# Patient Record
Sex: Female | Born: 1945 | Race: Black or African American | Hispanic: No | Marital: Single | Smoking: Never smoker
Health system: Southern US, Community
[De-identification: ages and names within clinical notes are randomized; demographics above are authoritative.]

## PROBLEM LIST (undated history)

## (undated) DIAGNOSIS — I10 Essential (primary) hypertension: Principal | ICD-10-CM

## (undated) DIAGNOSIS — E119 Type 2 diabetes mellitus without complications: Secondary | ICD-10-CM

## (undated) DIAGNOSIS — I639 Cerebral infarction, unspecified: Secondary | ICD-10-CM

## (undated) DIAGNOSIS — E669 Obesity, unspecified: Secondary | ICD-10-CM

## (undated) HISTORY — DX: Type 2 diabetes mellitus without complications: E11.9

## (undated) HISTORY — DX: Obesity, unspecified: E66.9

## (undated) HISTORY — DX: Essential (primary) hypertension: I10

## (undated) HISTORY — DX: Cerebral infarction, unspecified: I63.9

---

## 2009-02-23 ENCOUNTER — Emergency Department (HOSPITAL_COMMUNITY): Admission: EM | Admit: 2009-02-23 | Discharge: 2009-02-23 | Payer: Self-pay | Admitting: Family Medicine

## 2009-06-10 ENCOUNTER — Encounter: Admission: RE | Admit: 2009-06-10 | Discharge: 2009-06-10 | Payer: Self-pay | Admitting: Specialist

## 2009-08-28 ENCOUNTER — Ambulatory Visit (HOSPITAL_COMMUNITY): Admission: RE | Admit: 2009-08-28 | Discharge: 2009-08-28 | Payer: Self-pay | Admitting: Cardiology

## 2009-10-13 ENCOUNTER — Encounter: Admission: RE | Admit: 2009-10-13 | Discharge: 2009-10-13 | Payer: Self-pay | Admitting: Family Medicine

## 2010-10-10 ENCOUNTER — Encounter: Payer: Self-pay | Admitting: Family Medicine

## 2014-09-15 ENCOUNTER — Encounter (HOSPITAL_COMMUNITY): Payer: Self-pay | Admitting: Emergency Medicine

## 2014-09-15 ENCOUNTER — Emergency Department (HOSPITAL_COMMUNITY): Payer: Medicare Other

## 2014-09-15 ENCOUNTER — Emergency Department (HOSPITAL_COMMUNITY)
Admission: EM | Admit: 2014-09-15 | Discharge: 2014-09-15 | Disposition: A | Discharge status: Home / Self Care | Payer: Medicare Other | Attending: Emergency Medicine | Admitting: Emergency Medicine

## 2014-09-15 DIAGNOSIS — I10 Essential (primary) hypertension: Secondary | ICD-10-CM | POA: Diagnosis not present

## 2014-09-15 DIAGNOSIS — E119 Type 2 diabetes mellitus without complications: Secondary | ICD-10-CM | POA: Diagnosis not present

## 2014-09-15 DIAGNOSIS — R5383 Other fatigue: Secondary | ICD-10-CM | POA: Diagnosis not present

## 2014-09-15 DIAGNOSIS — R9431 Abnormal electrocardiogram [ECG] [EKG]: Secondary | ICD-10-CM | POA: Diagnosis present

## 2014-09-15 HISTORY — DX: Essential (primary) hypertension: I10

## 2014-09-15 HISTORY — DX: Type 2 diabetes mellitus without complications: E11.9

## 2014-09-15 LAB — BASIC METABOLIC PANEL
Anion gap: 12 (ref 5–15)
BUN: 7 mg/dL (ref 6–23)
CO2: 26 mmol/L (ref 19–32)
Calcium: 9.6 mg/dL (ref 8.4–10.5)
Chloride: 104 mEq/L (ref 96–112)
Creatinine, Ser: 0.83 mg/dL (ref 0.50–1.10)
GFR calc Af Amer: 82 mL/min — ABNORMAL LOW (ref >90)
GFR calc non Af Amer: 71 mL/min — ABNORMAL LOW (ref >90)
Glucose, Bld: 142 mg/dL — ABNORMAL HIGH (ref 70–99)
Potassium: 3.5 mmol/L (ref 3.5–5.1)
Sodium: 142 mmol/L (ref 135–145)

## 2014-09-15 LAB — BRAIN NATRIURETIC PEPTIDE: B Natriuretic Peptide: 218.4 pg/mL — ABNORMAL HIGH (ref 0.0–100.0)

## 2014-09-15 LAB — CBC WITH DIFFERENTIAL/PLATELET
Basophils Absolute: 0 10*3/uL (ref 0.0–0.1)
Basophils Relative: 0 % (ref 0–1)
Eosinophils Absolute: 0 10*3/uL (ref 0.0–0.7)
Eosinophils Relative: 0 % (ref 0–5)
HCT: 41 % (ref 36.0–46.0)
Hemoglobin: 13.5 g/dL (ref 12.0–15.0)
Lymphocytes Relative: 36 % (ref 12–46)
Lymphs Abs: 2.6 10*3/uL (ref 0.7–4.0)
MCH: 23.4 pg — ABNORMAL LOW (ref 26.0–34.0)
MCHC: 32.9 g/dL (ref 30.0–36.0)
MCV: 70.9 fL — ABNORMAL LOW (ref 78.0–100.0)
Monocytes Absolute: 0.3 10*3/uL (ref 0.1–1.0)
Monocytes Relative: 5 % (ref 3–12)
Neutro Abs: 4.3 10*3/uL (ref 1.7–7.7)
Neutrophils Relative %: 59 % (ref 43–77)
Platelets: 194 10*3/uL (ref 150–400)
RBC: 5.78 MIL/uL — ABNORMAL HIGH (ref 3.87–5.11)
RDW: 15.4 % (ref 11.5–15.5)
WBC: 7.3 10*3/uL (ref 4.0–10.5)

## 2014-09-15 LAB — I-STAT TROPONIN, ED
Troponin i, poc: 0.05 ng/mL (ref 0.00–0.08)
Troponin i, poc: 0.07 ng/mL (ref 0.00–0.08)

## 2014-09-15 LAB — CBG MONITORING, ED: Glucose-Capillary: 122 mg/dL — ABNORMAL HIGH (ref 70–99)

## 2014-09-15 MED ORDER — HYDRALAZINE HCL 20 MG/ML IJ SOLN
10.0000 mg | Freq: Once | INTRAMUSCULAR | Status: AC
  Administered 2014-09-15: 10 mg via INTRAVENOUS
  Filled 2014-09-15: qty 1

## 2014-09-15 NOTE — ED Notes (Signed)
MD at bedside. 

## 2014-09-15 NOTE — ED Notes (Signed)
X-ray at bedside

## 2014-09-15 NOTE — ED Provider Notes (Signed)
Patient presented to the ER with abnormal EKG. Patient reports that she has been feeling sick, mild weakness and dizziness recently and went to urgent care today because her doctor could not see her. She had an EKG performed and it was abnormal, was sent to the ER for further evaluation. Patient is not expressing any chest pain.  Face to face Exam: HEENT - PERRLA Lungs - CTAB Heart - RRR, no M/R/G Abd - S/NT/ND Neuro - alert, oriented x3  Plan: EKG consistent with LVH with repolarization abnormality. Remainder of workup was unremarkable. Troponin negative 2. Patient is very hypertensive here in the ER, this was treated here in the ER and will need to be addressed by her primary doctor. She does have a follow-up appointment this week.   Gilda Creasehristopher J. Adien Kimmel, MD 09/15/14 2227

## 2014-09-15 NOTE — ED Provider Notes (Signed)
CSN: [id number]     Arrival date & time 09/15/14  1831 History   [facility] Initiated Contact with Patient 09/15/14 1902     Chief Complaint  Patient presents with  . Abnormal ECG    The [facility] and she was told her ECG was abnormal.  The reason she went to [facility] is she was not feeling well this morning.     (Consider location/radiation/quality/duration/timing/severity/associated sxs/prior Treatment) The history is provided by the patient and medical records.    This is a 68 year old female with past medical history significant for hypertension, diabetes, presenting to the ED from urgent care for abnormal EKG.  Patient states this morning upon waking she just feel fatigued and unwell.  She was recently treated for a sinus infection and still felt as though she had some nasal congestion.  States she had breakfast and went to get up to use the bathroom and had a split second of dizziness after standing.  She states she has had issues with her blood sugar in the past with similar symptoms so was concerned for that.  States she called her PCP, Dr. Ricki MillerPang, but they could not see her today so she went to urgent care.  States upon arrival there was told her BP was elevated, EKG was performed and sent here for further evaluation.  Patient denies any chest pain, shortness of breath, palpitations, dizziness, lightheadedness, cough, fever, or chills.  Patient has no known cardiac hx.  She reports years ago she was evaluated by Dr. Nadara EatonGangi but was released with normal work-up.  Sister had MI, no other family cardiac history.  Patient has never been a smoker, no EtOH use.  Past Medical History  Diagnosis Date  . Hypertension   . Diabetes mellitus without complication    History reviewed. No pertinent past surgical history. History reviewed. No pertinent family history. History  Substance Use Topics  . Smoking status: Never Smoker   . Smokeless tobacco: Never Used  . Alcohol Use: No    OB History    No data available     Review of Systems  Cardiovascular:       Abnormal EKG  All other systems reviewed and are negative.     Allergies  Review of patient's allergies indicates not on file.  Home Medications   Prior to Admission medications   Not on File   BP 209/80 mmHg  Pulse 66  Temp(Src) 98.6 F (37 C) (Oral)  Resp 22  SpO2 97%   Physical Exam  Constitutional: She is oriented to person, place, and time. She appears well-developed and well-nourished. No distress.  HENT:  Head: Normocephalic and atraumatic.  Mouth/Throat: Oropharynx is clear and moist.  Eyes: Conjunctivae and EOM are normal. Pupils are equal, round, and reactive to light.  Neck: Normal range of motion. Neck supple.  Cardiovascular: Normal rate, regular rhythm and normal heart sounds.   Pulmonary/Chest: Effort normal and breath sounds normal. No respiratory distress. She has no wheezes.  Abdominal: Soft. Bowel sounds are normal. There is no tenderness. There is no guarding.  Musculoskeletal: Normal range of motion. She exhibits edema.  Trace pitting edema BLE No calf asymmetry, tenderness, or palpable cords; no overlying erythema or warmth to touch; DP pulses intact BLE  Neurological: She is alert and oriented to person, place, and time.  AAOx3, answering questions appropriately; equal strength UE and LE bilaterally; CN grossly intact; moves all extremities appropriately without ataxia; no focal neuro deficits  or facial asymmetry appreciated  Skin: Skin is warm and dry. She is not diaphoretic.  Psychiatric: She has a normal mood and affect.  Nursing note and vitals reviewed.   ED Course  Procedures (including critical care time) Labs Review Labs Reviewed  CBC WITH DIFFERENTIAL - Abnormal; Notable for the following:    RBC 5.78 (*)    MCV 70.9 (*)    MCH 23.4 (*)    All other components within normal limits  BASIC METABOLIC PANEL - Abnormal; Notable for the following:     Glucose, Bld 142 (*)    GFR calc non Af Amer 71 (*)    GFR calc Af Amer 82 (*)    All other components within normal limits  BRAIN NATRIURETIC PEPTIDE - Abnormal; Notable for the following:    B Natriuretic Peptide 218.4 (*)    All other components within normal limits  CBG MONITORING, ED - Abnormal; Notable for the following:    Glucose-Capillary 122 (*)    All other components within normal limits  I-STAT TROPOININ, ED  Rosezena SensorI-STAT TROPOININ, ED    Imaging Review Dg Chest Port 1 View  09/15/2014   CLINICAL DATA:  Abnormal electrocardiogram; hypertension  EXAM: PORTABLE CHEST - 1 VIEW  COMPARISON:  Chest CT August 28, 2009  FINDINGS: Lungs are clear. Heart is enlarged with pulmonary vascularity within normal limits. No adenopathy. No bone lesions.  IMPRESSION: Cardiomegaly.  No edema or consolidation.   Electronically Signed   By: Bretta BangWilliam  Woodruff M.D.   On: 09/15/2014 19:41     EKG Interpretation   Date/Time:  Monday September 15 2014 18:41:54 EST Ventricular Rate:  74 PR Interval:  202 QRS Duration: 102 QT Interval:  448 QTC Calculation: 497 R Axis:   54 Text Interpretation:  Sinus rhythm with Premature supraventricular  complexes Septal infarct , age undetermined Marked ST abnormality,  possible inferior subendocardial injury Abnormal ECG No previous tracing  Confirmed by POLLINA  MD, CHRISTOPHER [phone]([id number]) on 09/15/2014 6:51:33 PM      MDM   Final diagnoses:  Essential hypertension   68 y.o.F sent to ED from urgent care for abnormal EKG.  Patient does have noted ST abnormalities, no old EKG available for comparison.  She denies chest pain or SOB.  Lab work was obtained which is reassuring, troponin negative.  CXR clear.  Patient is hypertensive in the ED, states normal pressure for her is around 140's/80's.  Patient given dose of hydralazine.  Will obtain delta troponin and repeat EKG.  Delta troponin negative.  Repeat EKG more consistent with LVH pattern.  Patient remains  without any chest pain or SOB.  Her BP has improved, no signs of end organ damage today.  Patient will be discharged home. She has FU appt with her PCP tomorrow-- she was given copies of her labs and EKG for physician review.  I have also encouraged her to discuss her BP with her PCP.  Discussed plan with patient, he/she acknowledged understanding and agreed with plan of care.  Return precautions given for new or worsening symptoms.  Case discussed with attending physician, Dr. Blinda LeatherwoodPollina, who evaluated patient and agrees with assessment and plan of care.  Garlon HatchetLisa M Cynda Soule, PA-C 09/16/14 0004  Gilda Creasehristopher J. Pollina, MD 09/16/14 [phone]

## 2014-09-15 NOTE — ED Notes (Signed)
The patient went to [facility] and she was told her ECG was abnormal.  The reason she went to [facility] is she was not feeling well this morning.  She denies chest pain, sob or any other symptoms.  She did say she has been dizzy and lightheaded for a while and thought maybe it was her "sugar" (she has been told she is pre-diabetic).  She was sent here from [facility] to be evaluated.

## 2014-09-15 NOTE — ED Notes (Signed)
Pt CBG 122. RN notified.

## 2014-09-15 NOTE — Discharge Instructions (Signed)
Follow-up with your primary care physician tomorrow as scheduled.  Copies of lab work and EKG attached for their review. Return to the ED for new or worsening symptoms.

## 2014-09-23 DIAGNOSIS — I10 Essential (primary) hypertension: Secondary | ICD-10-CM | POA: Diagnosis not present

## 2014-10-06 DIAGNOSIS — I10 Essential (primary) hypertension: Secondary | ICD-10-CM | POA: Diagnosis not present

## 2014-10-06 DIAGNOSIS — E559 Vitamin D deficiency, unspecified: Secondary | ICD-10-CM | POA: Diagnosis not present

## 2014-10-06 DIAGNOSIS — R7309 Other abnormal glucose: Secondary | ICD-10-CM | POA: Diagnosis not present

## 2014-10-12 ENCOUNTER — Emergency Department (HOSPITAL_COMMUNITY): Payer: Medicare Other

## 2014-10-12 ENCOUNTER — Encounter (HOSPITAL_COMMUNITY): Payer: Self-pay

## 2014-10-12 ENCOUNTER — Emergency Department (HOSPITAL_COMMUNITY)
Admission: EM | Admit: 2014-10-12 | Discharge: 2014-10-12 | Disposition: A | Discharge status: Home / Self Care | Payer: Medicare Other | Attending: Emergency Medicine | Admitting: Emergency Medicine

## 2014-10-12 DIAGNOSIS — R5381 Other malaise: Secondary | ICD-10-CM

## 2014-10-12 DIAGNOSIS — R0789 Other chest pain: Secondary | ICD-10-CM

## 2014-10-12 DIAGNOSIS — I10 Essential (primary) hypertension: Secondary | ICD-10-CM | POA: Insufficient documentation

## 2014-10-12 DIAGNOSIS — E119 Type 2 diabetes mellitus without complications: Secondary | ICD-10-CM | POA: Diagnosis not present

## 2014-10-12 DIAGNOSIS — Z7982 Long term (current) use of aspirin: Secondary | ICD-10-CM | POA: Diagnosis not present

## 2014-10-12 DIAGNOSIS — R079 Chest pain, unspecified: Secondary | ICD-10-CM | POA: Diagnosis present

## 2014-10-12 DIAGNOSIS — R5383 Other fatigue: Secondary | ICD-10-CM | POA: Diagnosis not present

## 2014-10-12 DIAGNOSIS — Z79899 Other long term (current) drug therapy: Secondary | ICD-10-CM | POA: Diagnosis not present

## 2014-10-12 DIAGNOSIS — R069 Unspecified abnormalities of breathing: Secondary | ICD-10-CM | POA: Diagnosis not present

## 2014-10-12 LAB — CBC
HCT: 40.2 % (ref 36.0–46.0)
Hemoglobin: 13.3 g/dL (ref 12.0–15.0)
MCH: 23.5 pg — ABNORMAL LOW (ref 26.0–34.0)
MCHC: 33.1 g/dL (ref 30.0–36.0)
MCV: 70.9 fL — ABNORMAL LOW (ref 78.0–100.0)
Platelets: 173 10*3/uL (ref 150–400)
RBC: 5.67 MIL/uL — ABNORMAL HIGH (ref 3.87–5.11)
RDW: 15.3 % (ref 11.5–15.5)
WBC: 7.4 10*3/uL (ref 4.0–10.5)

## 2014-10-12 LAB — COMPREHENSIVE METABOLIC PANEL
ALT: 23 U/L (ref 0–35)
AST: 25 U/L (ref 0–37)
Albumin: 3.8 g/dL (ref 3.5–5.2)
Alkaline Phosphatase: 72 U/L (ref 39–117)
Anion gap: 5 (ref 5–15)
BUN: 7 mg/dL (ref 6–23)
CO2: 29 mmol/L (ref 19–32)
Calcium: 9.5 mg/dL (ref 8.4–10.5)
Chloride: 105 mmol/L (ref 96–112)
Creatinine, Ser: 0.82 mg/dL (ref 0.50–1.10)
GFR calc Af Amer: 83 mL/min — ABNORMAL LOW (ref >90)
GFR calc non Af Amer: 72 mL/min — ABNORMAL LOW (ref >90)
Glucose, Bld: 135 mg/dL — ABNORMAL HIGH (ref 70–99)
Potassium: 3.7 mmol/L (ref 3.5–5.1)
Sodium: 139 mmol/L (ref 135–145)
Total Bilirubin: 0.5 mg/dL (ref 0.3–1.2)
Total Protein: 7.3 g/dL (ref 6.0–8.3)

## 2014-10-12 LAB — TSH: TSH: 0.784 u[IU]/mL (ref 0.350–4.500)

## 2014-10-12 LAB — I-STAT TROPONIN, ED: Troponin i, poc: 0.05 ng/mL (ref 0.00–0.08)

## 2014-10-12 LAB — T4, FREE: Free T4: 1.07 ng/dL (ref 0.80–1.80)

## 2014-10-12 NOTE — ED Provider Notes (Signed)
CSN: [id number]     Arrival date & time 10/12/14  1032 History   [facility] Initiated Contact with Patient 10/12/14 1038     Chief Complaint  Patient presents with  . Chest Pain     (Consider location/radiation/quality/duration/timing/severity/associated sxs/prior Treatment) HPI The patient reports that she had been feeling some general malaise type symptoms for the past 2 week. She thought this may be due to her blood pressure medications. She has also been noting cramping feelings in her hands and her legs. She's had to go through several different adjustments to her medications and thinks that her medications make her feel unwell. She was concerned and didn't feel comfortable stopping them because she was advised that she could not discontinue them suddenly. She had an appointment scheduled for her primary care doctor on Friday but due to weather conditions the appointment was canceled. She thinks her symptoms started since starting felodipine about 2 weeks ago. She reports she felt really concerned because she was hoping to get this resolved on Friday. Then going into the weekend she was worried about being confined to her home (due to winter weather conditions) with no one to assist her if she started feeling worse. She reports this morning she started to feel short of breath and had a burning sensation across her upper chest. The symptoms are not there anymore. Her symptoms have come and gone intermittently in the past. She denies specifically associated with any activity or position. She has not had fever chills or sputum production. Past Medical History  Diagnosis Date  . Hypertension   . Diabetes mellitus without complication    History reviewed. No pertinent past surgical history. No family history on file. History  Substance Use Topics  . Smoking status: Never Smoker   . Smokeless tobacco: Never Used  . Alcohol Use: No   OB History    No data available     Review of  Systems  10 Systems reviewed and are negative for acute change except as noted in the HPI.   Allergies  Review of patient's allergies indicates no known allergies.  Home Medications   Prior to Admission medications   Medication Sig Start Date End Date Taking? Authorizing Provider  aspirin 81 MG tablet Take 81 mg by mouth daily.   Yes Historical Provider, MD  ergocalciferol (VITAMIN D2) 50000 UNITS capsule Take 50,000 Units by mouth once a week. Take on Mondays   Yes Historical Provider, MD  LORazepam (ATIVAN) 0.5 MG tablet Take 0.5 mg by mouth 2 (two) times daily.   Yes Historical Provider, MD  metoprolol succinate (TOPROL-XL) 50 MG 24 hr tablet Take 50 mg by mouth daily. 09/26/14  Yes Historical Provider, MD  valsartan-hydrochlorothiazide (DIOVAN-HCT) 320-12.5 MG per tablet Take 1 tablet by mouth daily.   Yes Historical Provider, MD   BP 161/75 mmHg  Pulse 44  Resp 18  Ht 5\' 8"  (1.727 m)  Wt 277 lb (125.646 kg)  BMI 42.13 kg/m2  SpO2 96% Physical Exam  Constitutional: She is oriented to person, place, and time. She appears well-developed and well-nourished.  HENT:  Head: Normocephalic and atraumatic.  Eyes: EOM are normal. Pupils are equal, round, and reactive to light.  Neck: Neck supple.  Cardiovascular: Normal rate, regular rhythm and intact distal pulses.  Exam reveals no gallop and no friction rub.   Murmur (Patient has approximately 1 to 2/6 systolic ejection murmur.) heard. Rhythm is regular with occasional ectopic beats.  Pulmonary/Chest: Effort normal and breath  sounds normal.  Abdominal: Soft. Bowel sounds are normal. She exhibits no distension. There is no tenderness.  Musculoskeletal: Normal range of motion. She exhibits no edema.  Neurological: She is alert and oriented to person, place, and time. She has normal strength. Coordination normal. GCS eye subscore is 4. GCS verbal subscore is 5. GCS motor subscore is 6.  Skin: Skin is warm, dry and intact.  Psychiatric:  She has a normal mood and affect.    ED Course  Procedures (including critical care time) Labs Review Labs Reviewed  CBC - Abnormal; Notable for the following:    RBC 5.67 (*)    MCV 70.9 (*)    MCH 23.5 (*)    All other components within normal limits  COMPREHENSIVE METABOLIC PANEL - Abnormal; Notable for the following:    Glucose, Bld 135 (*)    GFR calc non Af Amer 72 (*)    GFR calc Af Amer 83 (*)    All other components within normal limits  TSH  T4, FREE  I-STAT TROPOININ, ED    Imaging Review Dg Chest 2 View  10/12/2014   CLINICAL DATA:  Left-sided chest pain, hypertension  EXAM: CHEST  2 VIEW  COMPARISON:  09/15/2014  FINDINGS: Cardiac shadow is enlarged. The lungs are clear bilaterally. No acute bony abnormality is noted.  IMPRESSION: No acute abnormality noted.   Electronically Signed   By: Alcide Clever M.D.   On: 10/12/2014 11:31     EKG Interpretation   Date/Time:  Sunday October 12 2014 10:44:48 EST Ventricular Rate:  50 PR Interval:  174 QRS Duration: 106 QT Interval:  550 QTC Calculation: 502 R Axis:   30 Text Interpretation:  Sinus rhythm Multiple premature complexes, vent  LVH  with secondary repolarization abnormality Prolonged QT interval abnormal.  no change. Confirmed by Donnald Garre, MD, Lebron Conners [phone]) on 10/12/2014 11:00:53  AM      MDM   Final diagnoses:  Malaise and fatigue  Other chest pain   The patient has not been symptomatic since coming to the emergency department. She has trace he symptoms back for about 2 weeks. At this point in time based on diagnostic evaluation history a did fill the patient was safe for continued outpatient management. She has not taken her medications today and at this point I felt that she could hold the felodipine today and take her other regular medications and contact her family physician tomorrow. I have also advised that the patient follow-up with cardiology. She had been seen several years ago and does not have  a history of cardiac ischemic disease. I did however feel the patient's age and risk factors she should have cardiology assessment as well.    Arby Barrette, MD 10/12/14 [phone]

## 2014-10-12 NOTE — ED Notes (Addendum)
Intermittent burning in her chest and abdomen this morning, Presently she denies any burning or chest pain.  Pt. Denies any n/v/d or sob.  Pt. Lives alone and was unable to go to her Doctors appointment on Friday for her BP.  She became very anxious and worried.  She believes this has added to her chest burning.  She has been on 3 medications for her BP and they seem not to be working and requiring continuous adjustments which also makes the pt. Feel anxious.  Skin is warm and dry and  Pt. Is alert and oriented X4.  Pt. Reports being very scared yesterday, due to not feeling well and the weather.  She stated, "I was very anxious and nervous."

## 2014-10-12 NOTE — ED Notes (Signed)
Pt verbalized understanding of discharge instructions and no further questions 

## 2014-10-12 NOTE — Discharge Instructions (Signed)
Chest Pain (Nonspecific)  Since you have been experiencing symptoms of fatigue, weakness and cramps since starting felodipine, do not take today's dose. Call your family doctor in the morning to reschedule your appointment and continue your blood pressure and symptoms monitoring off felodipine. Also call the cardiologist listed below to schedule an appointment for further evaluation as soon as possible.  It is often hard to give a specific diagnosis for the cause of chest pain. There is always a chance that your pain could be related to something serious, such as a heart attack or a blood clot in the lungs. You need to follow up with your health care provider for further evaluation. CAUSES   Heartburn.  Pneumonia or bronchitis.  Anxiety or stress.  Inflammation around your heart (pericarditis) or lung (pleuritis or pleurisy).  A blood clot in the lung.  A collapsed lung (pneumothorax). It can develop suddenly on its own (spontaneous pneumothorax) or from trauma to the chest.  Shingles infection (herpes zoster virus). The chest wall is composed of bones, muscles, and cartilage. Any of these can be the source of the pain.  The bones can be bruised by injury.  The muscles or cartilage can be strained by coughing or overwork.  The cartilage can be affected by inflammation and become sore (costochondritis). DIAGNOSIS  Lab tests or other studies may be needed to find the cause of your pain. Your health care provider may have you take a test called an ambulatory electrocardiogram (ECG). An ECG records your heartbeat patterns over a 24-hour period. You may also have other tests, such as:  Transthoracic echocardiogram (TTE). During echocardiography, sound waves are used to evaluate how blood flows through your heart.  Transesophageal echocardiogram (TEE).  Cardiac monitoring. This allows your health care provider to monitor your heart rate and rhythm in real time.  Holter monitor. This is  a portable device that records your heartbeat and can help diagnose heart arrhythmias. It allows your health care provider to track your heart activity for several days, if needed.  Stress tests by exercise or by giving medicine that makes the heart beat faster. TREATMENT   Treatment depends on what may be causing your chest pain. Treatment may include:  Acid blockers for heartburn.  Anti-inflammatory medicine.  Pain medicine for inflammatory conditions.  Antibiotics if an infection is present.  You may be advised to change lifestyle habits. This includes stopping smoking and avoiding alcohol, caffeine, and chocolate.  You may be advised to keep your head raised (elevated) when sleeping. This reduces the chance of acid going backward from your stomach into your esophagus. Most of the time, nonspecific chest pain will improve within 2-3 days with rest and mild pain medicine.  HOME CARE INSTRUCTIONS   If antibiotics were prescribed, take them as directed. Finish them even if you start to feel better.  For the next few days, avoid physical activities that bring on chest pain. Continue physical activities as directed.  Do not use any tobacco products, including cigarettes, chewing tobacco, or electronic cigarettes.  Avoid drinking alcohol.  Only take medicine as directed by your health care provider.  Follow your health care provider's suggestions for further testing if your chest pain does not go away.  Keep any follow-up appointments you made. If you do not go to an appointment, you could develop lasting (chronic) problems with pain. If there is any problem keeping an appointment, call to reschedule. SEEK MEDICAL CARE IF:   Your chest pain does not  go away, even after treatment.  You have a rash with blisters on your chest.  You have a fever. SEEK IMMEDIATE MEDICAL CARE IF:   You have increased chest pain or pain that spreads to your arm, neck, jaw, back, or abdomen.  You  have shortness of breath.  You have an increasing cough, or you cough up blood.  You have severe back or abdominal pain.  You feel nauseous or vomit.  You have severe weakness.  You faint.  You have chills. This is an emergency. Do not wait to see if the pain will go away. Get medical help at once. Call your local emergency services (911 in [place] Do not drive yourself to the hospital. MAKE SURE YOU:   Understand these instructions.  Will watch your condition.  Will get help right away if you are not doing well or get worse. Document Released: 06/15/2005 Document Revised: 09/10/2013 Document Reviewed: 04/10/2008 [facility] Patient Information 2015 [employer] [place]. This information is not intended to replace advice given to you by your health care provider. Make sure you discuss any questions you have with your health care provider.

## 2014-10-12 NOTE — ED Notes (Signed)
Pt. Also reports that she has had a decreased appeptite for the last 2 weeks.

## 2014-10-14 DIAGNOSIS — F419 Anxiety disorder, unspecified: Secondary | ICD-10-CM | POA: Diagnosis not present

## 2014-10-14 DIAGNOSIS — I10 Essential (primary) hypertension: Secondary | ICD-10-CM | POA: Diagnosis not present

## 2014-10-27 DIAGNOSIS — I10 Essential (primary) hypertension: Secondary | ICD-10-CM | POA: Diagnosis not present

## 2014-10-27 DIAGNOSIS — F411 Generalized anxiety disorder: Secondary | ICD-10-CM | POA: Diagnosis not present

## 2014-10-27 DIAGNOSIS — E876 Hypokalemia: Secondary | ICD-10-CM | POA: Diagnosis not present

## 2014-10-27 DIAGNOSIS — R0600 Dyspnea, unspecified: Secondary | ICD-10-CM | POA: Diagnosis not present

## 2014-10-30 DIAGNOSIS — H5203 Hypermetropia, bilateral: Secondary | ICD-10-CM | POA: Diagnosis not present

## 2014-11-11 DIAGNOSIS — R0609 Other forms of dyspnea: Secondary | ICD-10-CM | POA: Diagnosis not present

## 2015-01-07 DIAGNOSIS — R931 Abnormal findings on diagnostic imaging of heart and coronary circulation: Secondary | ICD-10-CM | POA: Diagnosis not present

## 2015-01-07 DIAGNOSIS — R0602 Shortness of breath: Secondary | ICD-10-CM | POA: Diagnosis not present

## 2015-01-07 DIAGNOSIS — I10 Essential (primary) hypertension: Secondary | ICD-10-CM | POA: Diagnosis not present

## 2015-01-19 DIAGNOSIS — I10 Essential (primary) hypertension: Secondary | ICD-10-CM | POA: Diagnosis not present

## 2015-01-19 DIAGNOSIS — R7309 Other abnormal glucose: Secondary | ICD-10-CM | POA: Diagnosis not present

## 2015-01-26 DIAGNOSIS — E559 Vitamin D deficiency, unspecified: Secondary | ICD-10-CM | POA: Diagnosis not present

## 2015-01-26 DIAGNOSIS — R7309 Other abnormal glucose: Secondary | ICD-10-CM | POA: Diagnosis not present

## 2015-01-26 DIAGNOSIS — I10 Essential (primary) hypertension: Secondary | ICD-10-CM | POA: Diagnosis not present

## 2015-02-25 DIAGNOSIS — R0602 Shortness of breath: Secondary | ICD-10-CM | POA: Diagnosis not present

## 2015-02-25 DIAGNOSIS — R931 Abnormal findings on diagnostic imaging of heart and coronary circulation: Secondary | ICD-10-CM | POA: Diagnosis not present

## 2015-02-25 DIAGNOSIS — I10 Essential (primary) hypertension: Secondary | ICD-10-CM | POA: Diagnosis not present

## 2015-05-14 DIAGNOSIS — R931 Abnormal findings on diagnostic imaging of heart and coronary circulation: Secondary | ICD-10-CM | POA: Diagnosis not present

## 2015-09-09 DIAGNOSIS — Z0000 Encounter for general adult medical examination without abnormal findings: Secondary | ICD-10-CM | POA: Diagnosis not present

## 2015-09-09 DIAGNOSIS — D509 Iron deficiency anemia, unspecified: Secondary | ICD-10-CM | POA: Diagnosis not present

## 2015-09-09 DIAGNOSIS — R7309 Other abnormal glucose: Secondary | ICD-10-CM | POA: Diagnosis not present

## 2015-09-09 DIAGNOSIS — N390 Urinary tract infection, site not specified: Secondary | ICD-10-CM | POA: Diagnosis not present

## 2015-09-09 DIAGNOSIS — E559 Vitamin D deficiency, unspecified: Secondary | ICD-10-CM | POA: Diagnosis not present

## 2015-09-09 DIAGNOSIS — I10 Essential (primary) hypertension: Secondary | ICD-10-CM | POA: Diagnosis not present

## 2015-09-16 DIAGNOSIS — Z0000 Encounter for general adult medical examination without abnormal findings: Secondary | ICD-10-CM | POA: Diagnosis not present

## 2015-09-25 DIAGNOSIS — I10 Essential (primary) hypertension: Secondary | ICD-10-CM | POA: Diagnosis not present

## 2015-09-25 DIAGNOSIS — R931 Abnormal findings on diagnostic imaging of heart and coronary circulation: Secondary | ICD-10-CM | POA: Diagnosis not present

## 2015-09-25 DIAGNOSIS — R0602 Shortness of breath: Secondary | ICD-10-CM | POA: Diagnosis not present

## 2016-05-05 DIAGNOSIS — I10 Essential (primary) hypertension: Secondary | ICD-10-CM | POA: Diagnosis not present

## 2016-05-05 DIAGNOSIS — R0989 Other specified symptoms and signs involving the circulatory and respiratory systems: Secondary | ICD-10-CM | POA: Diagnosis not present

## 2016-05-13 DIAGNOSIS — R0602 Shortness of breath: Secondary | ICD-10-CM | POA: Diagnosis not present

## 2016-05-13 DIAGNOSIS — R9431 Abnormal electrocardiogram [ECG] [EKG]: Secondary | ICD-10-CM | POA: Diagnosis not present

## 2016-05-13 DIAGNOSIS — R931 Abnormal findings on diagnostic imaging of heart and coronary circulation: Secondary | ICD-10-CM | POA: Diagnosis not present

## 2016-07-14 DIAGNOSIS — R0602 Shortness of breath: Secondary | ICD-10-CM | POA: Diagnosis not present

## 2016-07-14 DIAGNOSIS — I10 Essential (primary) hypertension: Secondary | ICD-10-CM | POA: Diagnosis not present

## 2016-07-25 DIAGNOSIS — Z23 Encounter for immunization: Secondary | ICD-10-CM | POA: Diagnosis not present

## 2016-08-25 DIAGNOSIS — I10 Essential (primary) hypertension: Secondary | ICD-10-CM | POA: Diagnosis not present

## 2016-08-25 DIAGNOSIS — R0602 Shortness of breath: Secondary | ICD-10-CM | POA: Diagnosis not present

## 2016-10-31 DIAGNOSIS — E559 Vitamin D deficiency, unspecified: Secondary | ICD-10-CM | POA: Diagnosis not present

## 2016-10-31 DIAGNOSIS — N390 Urinary tract infection, site not specified: Secondary | ICD-10-CM | POA: Diagnosis not present

## 2016-10-31 DIAGNOSIS — E7800 Pure hypercholesterolemia, unspecified: Secondary | ICD-10-CM | POA: Diagnosis not present

## 2016-10-31 DIAGNOSIS — Z0000 Encounter for general adult medical examination without abnormal findings: Secondary | ICD-10-CM | POA: Diagnosis not present

## 2016-10-31 DIAGNOSIS — I10 Essential (primary) hypertension: Secondary | ICD-10-CM | POA: Diagnosis not present

## 2016-11-04 DIAGNOSIS — I10 Essential (primary) hypertension: Secondary | ICD-10-CM | POA: Diagnosis not present

## 2016-11-04 DIAGNOSIS — R0602 Shortness of breath: Secondary | ICD-10-CM | POA: Diagnosis not present

## 2016-11-07 DIAGNOSIS — E559 Vitamin D deficiency, unspecified: Secondary | ICD-10-CM | POA: Diagnosis not present

## 2016-11-07 DIAGNOSIS — I10 Essential (primary) hypertension: Secondary | ICD-10-CM | POA: Diagnosis not present

## 2016-11-07 DIAGNOSIS — Z0000 Encounter for general adult medical examination without abnormal findings: Secondary | ICD-10-CM | POA: Diagnosis not present

## 2016-11-07 DIAGNOSIS — Z23 Encounter for immunization: Secondary | ICD-10-CM | POA: Diagnosis not present

## 2016-12-15 DIAGNOSIS — I10 Essential (primary) hypertension: Secondary | ICD-10-CM | POA: Diagnosis not present

## 2016-12-15 DIAGNOSIS — R0602 Shortness of breath: Secondary | ICD-10-CM | POA: Diagnosis not present

## 2017-01-09 ENCOUNTER — Other Ambulatory Visit: Payer: Self-pay

## 2017-01-10 ENCOUNTER — Other Ambulatory Visit: Payer: Self-pay

## 2017-01-10 NOTE — Patient Outreach (Signed)
Medication Adherence for Ashley. Ashley [name] she wants a 90 days supply on valsartan/hctz 320/12.5 she has a lot of medication from the previous time she got 90 tablets this time they only gave her 30 tablets at [facility]  she prefers to have  90 days supply,Ashley [name] has a doctors appointment on may 3,2018,she thinks doctor is going to change her medication to something else.she does not have any problems taking her medication.    Lillia Abed CPhT Pharmacy Technician [facility] Management Direct Dial [phone]  Fax [phone] [email or web address]

## 2017-01-19 DIAGNOSIS — I10 Essential (primary) hypertension: Secondary | ICD-10-CM | POA: Diagnosis not present

## 2017-01-19 DIAGNOSIS — I119 Hypertensive heart disease without heart failure: Secondary | ICD-10-CM | POA: Diagnosis not present

## 2017-03-27 DIAGNOSIS — I10 Essential (primary) hypertension: Secondary | ICD-10-CM | POA: Diagnosis not present

## 2017-03-27 DIAGNOSIS — I119 Hypertensive heart disease without heart failure: Secondary | ICD-10-CM | POA: Diagnosis not present

## 2017-06-26 DIAGNOSIS — E559 Vitamin D deficiency, unspecified: Secondary | ICD-10-CM | POA: Diagnosis not present

## 2017-06-26 DIAGNOSIS — I10 Essential (primary) hypertension: Secondary | ICD-10-CM | POA: Diagnosis not present

## 2017-06-26 DIAGNOSIS — Z23 Encounter for immunization: Secondary | ICD-10-CM | POA: Diagnosis not present

## 2017-07-27 DIAGNOSIS — I119 Hypertensive heart disease without heart failure: Secondary | ICD-10-CM | POA: Diagnosis not present

## 2017-07-27 DIAGNOSIS — I10 Essential (primary) hypertension: Secondary | ICD-10-CM | POA: Diagnosis not present

## 2017-10-30 DIAGNOSIS — I119 Hypertensive heart disease without heart failure: Secondary | ICD-10-CM | POA: Diagnosis not present

## 2017-10-30 DIAGNOSIS — I10 Essential (primary) hypertension: Secondary | ICD-10-CM | POA: Diagnosis not present

## 2018-01-18 DIAGNOSIS — E559 Vitamin D deficiency, unspecified: Secondary | ICD-10-CM | POA: Diagnosis not present

## 2018-01-18 DIAGNOSIS — R7309 Other abnormal glucose: Secondary | ICD-10-CM | POA: Diagnosis not present

## 2018-01-18 DIAGNOSIS — I10 Essential (primary) hypertension: Secondary | ICD-10-CM | POA: Diagnosis not present

## 2018-02-05 DIAGNOSIS — E119 Type 2 diabetes mellitus without complications: Secondary | ICD-10-CM | POA: Diagnosis not present

## 2018-02-05 DIAGNOSIS — Z0000 Encounter for general adult medical examination without abnormal findings: Secondary | ICD-10-CM | POA: Diagnosis not present

## 2018-02-23 ENCOUNTER — Other Ambulatory Visit: Payer: Self-pay | Admitting: Pharmacist

## 2018-02-23 NOTE — Patient Outreach (Signed)
[facility] Care Management  02/23/2018  [name] 12/01/1945 [id number]   Incoming call from [name] in response to the [facility] [place] Medication Adherence Campaign. Speak with patient. HIPAA identifiers verified and verbal consent received.  Ms. [name] reports that she takes her valsartan-hydrochlorothiazide once daily as directed. Denies any missed doses or barriers to adherence. Counsel patient on the importance of medication adherence. Ms. [name] reports that she uses a pillbox to aid with her adherence. Reports that her blood pressures have been doing well, running around 130/70. Reports that she has a home monitor and occasionally checks her blood pressure at home. Counsel patient on the value of home blood pressure monitoring.   Ms. [name] denies any medication questions/concerns at this time. Provide patient with my phone number. Will close pharmacy episode.  Duanne MoronElisabeth Kensley Valladares, PharmD, [facility] GroupBCACP Clinical Pharmacist [facility] Care Management [phone]

## 2018-05-07 DIAGNOSIS — Z0189 Encounter for other specified special examinations: Secondary | ICD-10-CM | POA: Diagnosis not present

## 2018-05-07 DIAGNOSIS — I119 Hypertensive heart disease without heart failure: Secondary | ICD-10-CM | POA: Diagnosis not present

## 2018-05-07 DIAGNOSIS — I10 Essential (primary) hypertension: Secondary | ICD-10-CM | POA: Diagnosis not present

## 2018-06-18 DIAGNOSIS — Z23 Encounter for immunization: Secondary | ICD-10-CM | POA: Diagnosis not present

## 2018-07-16 DIAGNOSIS — I10 Essential (primary) hypertension: Secondary | ICD-10-CM | POA: Diagnosis not present

## 2018-07-16 DIAGNOSIS — I491 Atrial premature depolarization: Secondary | ICD-10-CM | POA: Diagnosis not present

## 2018-08-08 DIAGNOSIS — Z0189 Encounter for other specified special examinations: Secondary | ICD-10-CM | POA: Diagnosis not present

## 2018-08-08 DIAGNOSIS — I119 Hypertensive heart disease without heart failure: Secondary | ICD-10-CM | POA: Diagnosis not present

## 2018-08-20 DIAGNOSIS — E119 Type 2 diabetes mellitus without complications: Secondary | ICD-10-CM | POA: Diagnosis not present

## 2018-09-03 DIAGNOSIS — E559 Vitamin D deficiency, unspecified: Secondary | ICD-10-CM | POA: Diagnosis not present

## 2018-09-03 DIAGNOSIS — E119 Type 2 diabetes mellitus without complications: Secondary | ICD-10-CM | POA: Diagnosis not present

## 2018-09-03 DIAGNOSIS — I10 Essential (primary) hypertension: Secondary | ICD-10-CM | POA: Diagnosis not present

## 2018-11-12 ENCOUNTER — Ambulatory Visit: Payer: Self-pay | Admitting: Cardiology

## 2018-11-23 ENCOUNTER — Ambulatory Visit (INDEPENDENT_AMBULATORY_CARE_PROVIDER_SITE_OTHER): Payer: Medicare Other | Admitting: Cardiology

## 2018-11-23 ENCOUNTER — Encounter: Payer: Self-pay | Admitting: Cardiology

## 2018-11-23 VITALS — BP 175/90 | HR 58 | Ht 67.0 in | Wt 282.3 lb

## 2018-11-23 DIAGNOSIS — I10 Essential (primary) hypertension: Secondary | ICD-10-CM

## 2018-11-23 DIAGNOSIS — E119 Type 2 diabetes mellitus without complications: Secondary | ICD-10-CM

## 2018-11-23 DIAGNOSIS — R0600 Dyspnea, unspecified: Secondary | ICD-10-CM

## 2018-11-23 DIAGNOSIS — I491 Atrial premature depolarization: Secondary | ICD-10-CM

## 2018-11-23 DIAGNOSIS — E669 Obesity, unspecified: Secondary | ICD-10-CM

## 2018-11-23 DIAGNOSIS — R0609 Other forms of dyspnea: Secondary | ICD-10-CM

## 2018-11-23 DIAGNOSIS — Z6841 Body Mass Index (BMI) 40.0 and over, adult: Secondary | ICD-10-CM

## 2018-11-23 HISTORY — DX: Type 2 diabetes mellitus without complications: E11.9

## 2018-11-23 HISTORY — DX: Obesity, unspecified: E66.9

## 2018-11-23 HISTORY — DX: Essential (primary) hypertension: I10

## 2018-11-23 MED ORDER — VERAPAMIL HCL ER 120 MG PO TBCR: 120.0000 mg | EXTENDED_RELEASE_TABLET | Freq: Every day | ORAL | 3 refills | Status: DC

## 2018-11-23 NOTE — Progress Notes (Signed)
Subjective:  Primary Physician:  Merri Brunette, MD  Patient ID: Ashley [name], female    DOB: 1945-11-13, 73 y.o.   MRN: [id number]  Chief Complaint  Patient presents with  . Hypertension  . Follow-up    4mo    HPI: Ashley [name]  is a 73 y.o. female  with  morbid obesity, hypertension, chronic dyspnea on exertion, abnormal EKG with a negative nuclear stress test in 2016. Except for chronic dyspnea and concern for hypertension, she has no other specific complaints.  Past Medical History:  Diagnosis Date  . Controlled diabetes mellitus type II without complication (HCC) 11/23/2018  . Diabetes mellitus without complication (HCC)   . Essential hypertension 11/23/2018  . Hypertension   . Obesity 11/23/2018    History reviewed. No pertinent surgical history.  Social History   Socioeconomic History  . Marital status: Married    Spouse name: Not on file  . Number of children: Not on file  . Years of education: Not on file  . Highest education level: Not on file  Occupational History  . Not on file  Social Needs  . Financial resource strain: Not on file  . Food insecurity:    Worry: Not on file    Inability: Not on file  . Transportation needs:    Medical: Not on file    Non-medical: Not on file  Tobacco Use  . Smoking status: Never Smoker  . Smokeless tobacco: Never Used  Substance and Sexual Activity  . Alcohol use: No  . Drug use: No  . Sexual activity: Not on file  Lifestyle  . Physical activity:    Days per week: Not on file    Minutes per session: Not on file  . Stress: Not on file  Relationships  . Social connections:    Talks on phone: Not on file    Gets together: Not on file    Attends religious service: Not on file    Active member of club or organization: Not on file    Attends meetings of clubs or organizations: Not on file    Relationship status: Not on file  . Intimate partner violence:    Fear of current or ex partner: Not on file    Emotionally abused: Not on file    Physically abused: Not on file    Forced sexual activity: Not on file  Other Topics Concern  . Not on file  Social History Narrative  . Not on file    Current Outpatient Medications on File Prior to Visit  Medication Sig Dispense Refill  . aspirin 81 MG tablet Take 81 mg by mouth daily.    . Cholecalciferol (VITAMIN D3) 5000 units CAPS Take 1 capsule by mouth daily.    . cloNIDine (CATAPRES) 0.2 MG tablet Take 0.2 mg by mouth 2 (two) times daily.    . isosorbide-hydrALAZINE (BIDIL) 20-37.5 MG tablet Take 1 tablet by mouth 3 (three) times daily.    . valsartan-hydrochlorothiazide (DIOVAN-HCT) 320-12.5 MG per tablet Take 1 tablet by mouth daily.     No current facility-administered medications on file prior to visit.      Review of Systems  Constitutional: Negative for malaise/fatigue and weight loss.  Respiratory: Positive for shortness of breath (stable). Negative for cough and hemoptysis.   Cardiovascular: Negative for chest pain, palpitations, claudication and leg swelling.  Gastrointestinal: Negative for abdominal pain, blood in stool, constipation, heartburn and vomiting.  Genitourinary: Negative for dysuria.  Musculoskeletal:  Negative for joint pain and myalgias.  Neurological: Negative for dizziness, focal weakness and headaches.  Endo/Heme/Allergies: Does not bruise/bleed easily.  Psychiatric/Behavioral: Negative for depression. The patient is not nervous/anxious.   All other systems reviewed and are negative.      Objective:  Blood pressure (!) 175/90, pulse (!) 58, height 5\' 7"  (1.702 m), weight 282 lb 4.8 oz (128.1 kg), SpO2 98 %. Body mass index is 44.21 kg/m.  Physical Exam  Constitutional: She appears well-developed. No distress.  Morbidly obese  HENT:  Head: Atraumatic.  Eyes: Conjunctivae are normal.  Neck: Neck supple. No thyromegaly present.  Short neck and difficult to evaluate JVP  Cardiovascular: Normal rate,  regular rhythm and normal heart sounds. Frequent extrasystoles are present. Exam reveals no gallop.  No murmur heard. Pulses:      Carotid pulses are 2+ on the right side and 2+ on the left side.      Dorsalis pedis pulses are 2+ on the right side and 2+ on the left side.       Posterior tibial pulses are 2+ on the right side and 2+ on the left side.  Femoral and popliteal pulse difficult to feel due to patient's body habitus.   Pulmonary/Chest: Effort normal and breath sounds normal.  Abdominal: Soft. Bowel sounds are normal.  Musculoskeletal: Normal range of motion.        General: No edema.  Neurological: She is alert.  Skin: Skin is warm and dry.  Psychiatric: She has a normal mood and affect.    CARDIAC STUDIES:   Echocardiogram 07/16/2018:  Left ventricle cavity is normal in size. Moderate concentric hypertrophy of the left ventricle. Normal global wall motion. Doppler evidence of grade II (pseudonormal) diastolic dysfunction. Diastolic dysfunction findings suggests elevated LA/LV end diastolic pressure. Calculated EF 55%. Left atrial cavity is severely dilated in apical views, measures 4.7 cm in ling axis views. Mild aortic valve leaflet thickening. Normal aortic valve leaflet mobility. Trace aortic regurgitation. Mild prolapse of the mitral valve leaflets. No significant myxomatous degeneration noted. Mild to moderate mitral regurgitation. Trace tricuspid regurgitation. Unable to estimate PA pressure due to absence/minimal TR signal. Compared to the study done on 05/14/2015, no significant change.  Lexiscan Myoview nuclear stress test 03/02/2015: 1. The resting electrocardiogram demonstrated normal sinus rhythm, IVCD, LVH, no resting arrhythmias and nonspecific ST-T changes. Stress EKG is non-diagnostic for ischemia as it a pharmacologic stress using Lexiscan. Occasional PVC and atrial couplets and PAC. 2. Myocardial perfusion imaging is normal. Overall left ventricular systolic function  was normal without regional wall motion abnormalities. The left ventricular ejection fraction was 54%.  Assessment & Recommendations:   1. Essential hypertension  2. Dyspnea on exertion  3. Controlled type 2 diabetes mellitus without complication, without long-term current use of insulin (HCC)  4. Class 3 severe obesity due to excess calories without serious comorbidity with body mass index (BMI) of 40.0 to 44.9 in adult (HCC)  5.  Frequent PACs, abnormal EKG. EKG 11/23/2018: Sinus  Rhythm  with  Paroxysmal  Atrial  Tachycardia -Incomplete left bundle branch block.   Voltage criteria for LVH  (S(V1)+R(V6) exceeds 3.50 mV).   -Anterior infarct -age undetermined.   -ST depression   +   Extensive T-abnormality  -Secondary to hypertrophy -possible  Anterolateral and inferior ischemia.  No significant change compared to 08/08/2018.  PACs more frequent.  Recommendation:   Patient is here on a six-month office visit and follow-up of hypertension, she has started to exercise  very vigorously and has been exercising the past 2 months and spending about 2000 cal a day.  I given her positive reinforcement.  Also I have discussed with her regarding the findings of the heart rate and also weight and number of steps that she has taken on her [employer] watch and explained to her how to use it to give her positive reinforcement.  I repeated an EKG today due to irregular heart rhythm detected, fortunately she has frequent PACs and brief atrial tachycardia runs, this may be a harbinger of atrial fibrillation.  I have started her on verapamil both for hypertension and for frequent PACs, she is very sensitive to medications, verapamil 120 mg q. daily was prescribed.  I would like to see her back in 3 months for follow-up, with repeat EKG.  Diabetes is well controlled.  Otherwise she seems to be doing well. Dyspnea is improving and no CHF on exam.  Yates Decamp, MD, [facility] 11/23/2018, 1:25 PM [facility].  [place] Pager: [phone] Office: [phone] If no answer Cell [phone]

## 2018-12-03 ENCOUNTER — Other Ambulatory Visit: Payer: Self-pay | Admitting: Cardiology

## 2019-02-16 ENCOUNTER — Other Ambulatory Visit: Payer: Self-pay | Admitting: Cardiology

## 2019-02-16 DIAGNOSIS — I491 Atrial premature depolarization: Secondary | ICD-10-CM

## 2019-02-16 DIAGNOSIS — I10 Essential (primary) hypertension: Secondary | ICD-10-CM

## 2019-02-18 NOTE — Telephone Encounter (Signed)
Please fill

## 2019-02-25 ENCOUNTER — Ambulatory Visit: Payer: Medicare Other | Admitting: Cardiology

## 2019-02-25 ENCOUNTER — Other Ambulatory Visit: Payer: Self-pay | Admitting: Cardiology

## 2019-02-26 ENCOUNTER — Other Ambulatory Visit: Payer: Self-pay | Admitting: Cardiology

## 2019-02-27 ENCOUNTER — Ambulatory Visit: Payer: Medicare Other | Admitting: Cardiology

## 2019-03-12 ENCOUNTER — Ambulatory Visit: Payer: Medicare Other | Admitting: Cardiology

## 2019-03-19 ENCOUNTER — Ambulatory Visit: Payer: Medicare Other | Admitting: Cardiology

## 2019-03-25 DIAGNOSIS — E559 Vitamin D deficiency, unspecified: Secondary | ICD-10-CM | POA: Diagnosis not present

## 2019-03-25 DIAGNOSIS — I10 Essential (primary) hypertension: Secondary | ICD-10-CM | POA: Diagnosis not present

## 2019-03-25 DIAGNOSIS — E119 Type 2 diabetes mellitus without complications: Secondary | ICD-10-CM | POA: Diagnosis not present

## 2019-04-01 DIAGNOSIS — E119 Type 2 diabetes mellitus without complications: Secondary | ICD-10-CM | POA: Diagnosis not present

## 2019-04-01 DIAGNOSIS — I10 Essential (primary) hypertension: Secondary | ICD-10-CM | POA: Diagnosis not present

## 2019-04-01 DIAGNOSIS — Z0000 Encounter for general adult medical examination without abnormal findings: Secondary | ICD-10-CM | POA: Diagnosis not present

## 2019-04-01 DIAGNOSIS — E559 Vitamin D deficiency, unspecified: Secondary | ICD-10-CM | POA: Diagnosis not present

## 2019-04-08 ENCOUNTER — Encounter: Payer: Self-pay | Admitting: Cardiology

## 2019-04-09 ENCOUNTER — Other Ambulatory Visit: Payer: Self-pay

## 2019-04-09 ENCOUNTER — Ambulatory Visit (INDEPENDENT_AMBULATORY_CARE_PROVIDER_SITE_OTHER): Payer: Medicare Other | Admitting: Cardiology

## 2019-04-09 ENCOUNTER — Encounter: Payer: Self-pay | Admitting: Cardiology

## 2019-04-09 VITALS — BP 170/88 | HR 58 | Ht 67.0 in | Wt 277.9 lb

## 2019-04-09 DIAGNOSIS — Z6841 Body Mass Index (BMI) 40.0 and over, adult: Secondary | ICD-10-CM

## 2019-04-09 DIAGNOSIS — I10 Essential (primary) hypertension: Secondary | ICD-10-CM

## 2019-04-09 DIAGNOSIS — R0609 Other forms of dyspnea: Secondary | ICD-10-CM | POA: Diagnosis not present

## 2019-04-09 DIAGNOSIS — I471 Supraventricular tachycardia: Secondary | ICD-10-CM

## 2019-04-09 DIAGNOSIS — R0600 Dyspnea, unspecified: Secondary | ICD-10-CM

## 2019-04-09 MED ORDER — ATENOLOL 50 MG PO TABS: 50.0000 mg | ORAL_TABLET | Freq: Two times a day (BID) | ORAL | 2 refills | Status: DC

## 2019-04-09 NOTE — Progress Notes (Signed)
Subjective:  Primary Physician:  Merri BrunettePharr, Walter, MD  Patient ID: Donney DiceMollie Bettie Springsteen, female    DOB: 05/19/1946, 73 y.o.   MRN: [id number]  Chief Complaint  Patient presents with  . Atrial Fibrillation  . Follow-up    3 mth    HPI: Ashley [name]  is a 73 y.o. female  with  morbid obesity, hypertension, chronic dyspnea on exertion, abnormal EKG with a negative nuclear stress test in 2016. Except for chronic dyspnea and concern for hypertension, she has no other specific complaints.  However she states that since she has been exercising regularly and loosing weight, she has been feeling the best and states BP is now well controlled at other doctor's visits. No palpitations or chest pain and chronic dyspnea is stable.   Past Medical History:  Diagnosis Date  . Controlled diabetes mellitus type II without complication (HCC) 11/23/2018  . Diabetes mellitus without complication (HCC)   . Essential hypertension 11/23/2018  . Hypertension   . Obesity 11/23/2018    History reviewed. No pertinent surgical history.  Social History   Socioeconomic History  . Marital status: Married    Spouse name: Not on file  . Number of children: 1  . Years of education: Not on file  . Highest education level: Not on file  Occupational History  . Not on file  Social Needs  . Financial resource strain: Not on file  . Food insecurity    Worry: Not on file    Inability: Not on file  . Transportation needs    Medical: Not on file    Non-medical: Not on file  Tobacco Use  . Smoking status: Never Smoker  . Smokeless tobacco: Never Used  Substance and Sexual Activity  . Alcohol use: No  . Drug use: No  . Sexual activity: Not on file  Lifestyle  . Physical activity    Days per week: Not on file    Minutes per session: Not on file  . Stress: Not on file  Relationships  . Social Musicianconnections    Talks on phone: Not on file    Gets together: Not on file    Attends religious service: Not on  file    Active member of club or organization: Not on file    Attends meetings of clubs or organizations: Not on file    Relationship status: Not on file  . Intimate partner violence    Fear of current or ex partner: Not on file    Emotionally abused: Not on file    Physically abused: Not on file    Forced sexual activity: Not on file  Other Topics Concern  . Not on file  Social History Narrative  . Not on file    Current Outpatient Medications on File Prior to Visit  Medication Sig Dispense Refill  . aspirin 81 MG tablet Take 81 mg by mouth daily.    . Cholecalciferol (VITAMIN D3) 5000 units CAPS Take 1 capsule by mouth daily.    . cloNIDine (CATAPRES) 0.2 MG tablet Take 0.2 mg by mouth 2 (two) times daily.    . valsartan-hydrochlorothiazide (DIOVAN-HCT) 320-12.5 MG tablet TAKE 1 TABLET BY MOUTH EVERY DAY 90 tablet 0  . verapamil (CALAN-SR) 120 MG CR tablet TAKE 1 TABLET (120 MG TOTAL) BY MOUTH AT BEDTIME. 90 tablet 1   No current facility-administered medications on file prior to visit.    Review of Systems  Constitutional: Negative for malaise/fatigue and weight loss.  Respiratory: Positive for shortness of breath (stable and improving). Negative for cough and hemoptysis.   Cardiovascular: Negative for chest pain, palpitations, claudication and leg swelling.  Gastrointestinal: Negative for abdominal pain, blood in stool, constipation, heartburn and vomiting.  Genitourinary: Negative for dysuria.  Musculoskeletal: Negative for joint pain and myalgias.  Neurological: Negative for dizziness, focal weakness and headaches.  Endo/Heme/Allergies: Does not bruise/bleed easily.  Psychiatric/Behavioral: Negative for depression. The patient is not nervous/anxious.   All other systems reviewed and are negative.  Objective:  Blood pressure (!) 216/93, pulse (!) 58, height 5\' 7"  (1.702 m), weight 277 lb 14.4 oz (126.1 kg), SpO2 96 %. Body mass index is 43.53 kg/m.  Physical Exam   Constitutional: She appears well-developed. No distress.  Morbidly obese  HENT:  Head: Atraumatic.  Eyes: Conjunctivae are normal.  Neck: Neck supple. No thyromegaly present.  Short neck and difficult to evaluate JVP  Cardiovascular: Normal rate, regular rhythm and normal heart sounds. Frequent extrasystoles are present. Exam reveals no gallop.  No murmur heard. Pulses:      Carotid pulses are 2+ on the right side and 2+ on the left side.      Dorsalis pedis pulses are 2+ on the right side and 2+ on the left side.       Posterior tibial pulses are 2+ on the right side and 2+ on the left side.  Femoral and popliteal pulse difficult to feel due to patient's body habitus.   Pulmonary/Chest: Effort normal and breath sounds normal.  Abdominal: Soft. Bowel sounds are normal.  Musculoskeletal: Normal range of motion.        General: No edema.  Neurological: She is alert.  Skin: Skin is warm and dry.  Psychiatric: She has a normal mood and affect.    CARDIAC STUDIES:   Echocardiogram 07/16/2018:  Left ventricle cavity is normal in size. Moderate concentric hypertrophy of the left ventricle. Normal global wall motion. Doppler evidence of grade II (pseudonormal) diastolic dysfunction. Diastolic dysfunction findings suggests elevated LA/LV end diastolic pressure. Calculated EF 55%. Left atrial cavity is severely dilated in apical views, measures 4.7 cm in ling axis views. Mild aortic valve leaflet thickening. Normal aortic valve leaflet mobility. Trace aortic regurgitation. Mild prolapse of the mitral valve leaflets. No significant myxomatous degeneration noted. Mild to moderate mitral regurgitation. Trace tricuspid regurgitation. Unable to estimate PA pressure due to absence/minimal TR signal. Compared to the study done on 05/14/2015, no significant change.  Lexiscan Myoview nuclear stress test 03/02/2015: 1. The resting electrocardiogram demonstrated normal sinus rhythm, IVCD, LVH, no resting  arrhythmias and nonspecific ST-T changes. Stress EKG is non-diagnostic for ischemia as it a pharmacologic stress using Lexiscan. Occasional PVC and atrial couplets and PAC. 2. Myocardial perfusion imaging is normal. Overall left ventricular systolic function was normal without regional wall motion abnormalities. The left ventricular ejection fraction was 54%.  Assessment & Recommendations:     ICD-10-CM   1. Atrial tachycardia (HCC)  I47.1 EKG 12-Lead    atenolol (TENORMIN) 50 MG tablet  2. Essential hypertension  I10 atenolol (TENORMIN) 50 MG tablet  3. Dyspnea on exertion  R06.09   4. Class 3 severe obesity due to excess calories without serious comorbidity with body mass index (BMI) of 40.0 to 44.9 in adult [facility])  E66.01    Z68.41     EKG 04/08/19/20: Normal sinus rhythm at the rate of 60 bpm, frequent PACs, 6 beat run of atrial tachycardia, LVH with repolarization abnormality.  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  No significant change from  EKG 11/23/2018. No significant change compared to 08/08/2018.  PACs more frequent.  Recommendation:   Patient is here on a 218-month office visit and follow-up of hypertension, she has started to exercise very vigorously and has lost about 8 Lbs since last OV. States BP is well controlled.   In view of frequent PACs and uncontrolled HTN, added Atenolol 50 mg BID. Patient is sensitive to medications.  Although frequent PAC's no palpitations. She is certainly at risk for A. Fib.   I given her positive reinforcement regarding weight loss.   OV in 6 weeks (VV) for f/u of hypertension.   Yates DecampJay Sindee Stucker, MD, [facility] 04/09/2019, 3:57 PM [facility]. [place] Pager: [phone] Office: [phone] If no answer Cell [phone]

## 2019-05-28 ENCOUNTER — Other Ambulatory Visit: Payer: Self-pay | Admitting: Cardiology

## 2019-06-24 DIAGNOSIS — Z23 Encounter for immunization: Secondary | ICD-10-CM | POA: Diagnosis not present

## 2019-07-02 ENCOUNTER — Other Ambulatory Visit: Payer: Self-pay | Admitting: Cardiology

## 2019-07-02 DIAGNOSIS — I471 Supraventricular tachycardia: Secondary | ICD-10-CM

## 2019-07-02 DIAGNOSIS — I10 Essential (primary) hypertension: Secondary | ICD-10-CM

## 2019-09-08 ENCOUNTER — Other Ambulatory Visit: Payer: Self-pay | Admitting: Cardiology

## 2019-09-09 ENCOUNTER — Other Ambulatory Visit: Payer: Self-pay

## 2019-09-09 DIAGNOSIS — I10 Essential (primary) hypertension: Secondary | ICD-10-CM

## 2019-09-09 MED ORDER — CLONIDINE HCL 0.2 MG PO TABS: 0.2000 mg | ORAL_TABLET | Freq: Two times a day (BID) | ORAL | 0 refills | Status: DC

## 2019-10-07 ENCOUNTER — Other Ambulatory Visit: Payer: Self-pay

## 2019-10-07 ENCOUNTER — Encounter: Payer: Self-pay | Admitting: Cardiology

## 2019-10-07 ENCOUNTER — Telehealth: Payer: Medicare Other | Admitting: Cardiology

## 2019-10-07 VITALS — BP 140/70 | HR 76 | Ht 67.0 in | Wt 246.0 lb

## 2019-10-07 DIAGNOSIS — Z6841 Body Mass Index (BMI) 40.0 and over, adult: Secondary | ICD-10-CM

## 2019-10-07 DIAGNOSIS — I10 Essential (primary) hypertension: Secondary | ICD-10-CM | POA: Diagnosis not present

## 2019-10-07 DIAGNOSIS — R0609 Other forms of dyspnea: Secondary | ICD-10-CM

## 2019-10-07 DIAGNOSIS — R0600 Dyspnea, unspecified: Secondary | ICD-10-CM

## 2019-10-07 DIAGNOSIS — I471 Supraventricular tachycardia: Secondary | ICD-10-CM

## 2019-10-07 MED ORDER — ATENOLOL 50 MG PO TABS: 50.0000 mg | ORAL_TABLET | Freq: Two times a day (BID) | ORAL | 3 refills | Status: DC

## 2019-10-07 NOTE — Progress Notes (Signed)
Virtual Visit via Video Note: This visit type was conducted due to national recommendations for restrictions regarding the COVID-19 Pandemic (e.g. social distancing).  This format is felt to be most appropriate for this patient at this time.  All issues noted in this document were discussed and addressed.  No physical exam was performed (except for noted visual exam findings with Telehealth visits).  The patient has consented to conduct a Telehealth visit and understands insurance will be billed.   I connected with@, on 10/07/19 at  by a video enabled telemedicine application and verified that I am speaking with the correct person using two identifiers.   I discussed the limitations of evaluation and management by telemedicine and the availability of in person appointments. The patient expressed understanding and agreed to proceed.   I have discussed with patient regarding the safety during COVID Pandemic and steps and precautions to be taken including social distancing, frequent hand wash and use of detergent soap, gels with the patient. I asked the patient to avoid touching mouth, nose, eyes, ears with the hands. I encouraged regular walking around the neighborhood and exercise and regular diet, as long as social distancing can be maintained.   Subjective:  Primary Physician:  Merri Brunette, MD  Patient ID: Ashley [name], female    DOB: Feb 09, 1946, 74 y.o.   MRN: [id number]  Chief Complaint  Patient presents with  . Hypertension  . Follow-up    52mo    HPI: Ashley [name]  is a 74 y.o. female  with  morbid obesity, hypertension, chronic dyspnea on exertion, abnormal EKG with a negative nuclear stress test in 2016. Except for chronic dyspnea and concern for hypertension, she has no other specific complaints.  However she states that since she has been exercising regularly and loosing weight, she has been feeling the best and states BP is now well controlled, she has lost about  35Lbs in the past 6 months.  Also states since being on atenolol, she has not had palpitations.    Past Medical History:  Diagnosis Date  . Controlled diabetes mellitus type II without complication (HCC) 11/23/2018  . Diabetes mellitus without complication (HCC)   . Essential hypertension 11/23/2018  . Hypertension   . Obesity 11/23/2018    History reviewed. No pertinent surgical history.  Social History   Socioeconomic History  . Marital status: Married    Spouse name: Not on file  . Number of children: 1  . Years of education: Not on file  . Highest education level: Not on file  Occupational History  . Not on file  Tobacco Use  . Smoking status: Never Smoker  . Smokeless tobacco: Never Used  Substance and Sexual Activity  . Alcohol use: No  . Drug use: No  . Sexual activity: Not on file  Other Topics Concern  . Not on file  Social History Narrative  . Not on file   Social Determinants of Health   Financial Resource Strain:   . Difficulty of Paying Living Expenses: Not on file  Food Insecurity:   . Worried About Programme researcher, broadcasting/film/video in the Last Year: Not on file  . Ran Out of Food in the Last Year: Not on file  Transportation Needs:   . Lack of Transportation (Medical): Not on file  . Lack of Transportation (Non-Medical): Not on file  Physical Activity:   . Days of Exercise per Week: Not on file  . Minutes of Exercise per Session: Not  on file  Stress:   . Feeling of Stress : Not on file  Social Connections:   . Frequency of Communication with Friends and Family: Not on file  . Frequency of Social Gatherings with Friends and Family: Not on file  . Attends Religious Services: Not on file  . Active Member of Clubs or Organizations: Not on file  . Attends Archivist Meetings: Not on file  . Marital Status: Not on file  Intimate Partner Violence:   . Fear of Current or Ex-Partner: Not on file  . Emotionally Abused: Not on file  . Physically Abused: Not on  file  . Sexually Abused: Not on file    Current Outpatient Medications on File Prior to Visit  Medication Sig Dispense Refill  . aspirin 81 MG tablet Take 81 mg by mouth. occasionally    . Cholecalciferol (VITAMIN D3) 5000 units CAPS Take 1 capsule by mouth daily.    . cloNIDine (CATAPRES) 0.2 MG tablet Take 1 tablet (0.2 mg total) by mouth 2 (two) times daily. 60 tablet 0  . valsartan-hydrochlorothiazide (DIOVAN-HCT) 320-12.5 MG tablet TAKE 1 TABLET BY MOUTH EVERY DAY 90 tablet 0   No current facility-administered medications on file prior to visit.   Review of Systems  Constitutional: Negative for malaise/fatigue and weight loss.  Respiratory: Negative for cough, hemoptysis and shortness of breath.   Cardiovascular: Negative for chest pain, palpitations, claudication and leg swelling.  Gastrointestinal: Negative for abdominal pain, blood in stool, constipation, heartburn and vomiting.  Genitourinary: Negative for dysuria.  Musculoskeletal: Negative for joint pain and myalgias.  Neurological: Negative for dizziness, focal weakness and headaches.  Endo/Heme/Allergies: Does not bruise/bleed easily.  Psychiatric/Behavioral: Negative for depression. The patient is not nervous/anxious.   All other systems reviewed and are negative.  Objective:  Blood pressure 140/70, pulse 76, height 5\' 7"  (1.702 m), weight 246 lb (111.6 kg). Body mass index is 38.53 kg/m. Vitals with BMI 10/07/2019 04/09/2019 11/23/2018  Height 5\' 7"  5\' 7"  5\' 7"   Weight 246 lbs 277 lbs 14 oz 282 lbs 5 oz  BMI 38.52 54.27 06.2  Systolic 376 283 151  Diastolic 70 88 90  Pulse 76 58 58     Physical exam not performed or limited due to virtual visit.  Patient appeared to be in no distress, Neck was supple, respiration was not labored.  Please see exam details from prior visit is as below.  Physical Exam  Constitutional:  Moderately obese in no acute distress.  HENT:  Head: Atraumatic.  Eyes: Conjunctivae are normal.   Neck: No thyromegaly present.  Short neck and difficult to evaluate JVP  Cardiovascular: Normal rate, regular rhythm and normal heart sounds. Frequent extrasystoles are present. Exam reveals no gallop.  No murmur heard. Pulses:      Carotid pulses are 2+ on the right side and 2+ on the left side.      Dorsalis pedis pulses are 2+ on the right side and 2+ on the left side.       Posterior tibial pulses are 2+ on the right side and 2+ on the left side.  Femoral and popliteal pulse difficult to feel due to patient's body habitus.   Pulmonary/Chest: Effort normal and breath sounds normal.  Abdominal: Soft. Bowel sounds are normal.  Musculoskeletal:        General: No edema. Normal range of motion.     Cervical back: Neck supple.  Neurological: She is alert.  Skin: Skin is warm and  dry.  Psychiatric: She has a normal mood and affect.    CARDIAC STUDIES:   Echocardiogram 07/16/2018:  Left ventricle cavity is normal in size. Moderate concentric hypertrophy of the left ventricle. Normal global wall motion. Doppler evidence of grade II (pseudonormal) diastolic dysfunction. Diastolic dysfunction findings suggests elevated LA/LV end diastolic pressure. Calculated EF 55%. Left atrial cavity is severely dilated in apical views, measures 4.7 cm in ling axis views. Mild aortic valve leaflet thickening. Normal aortic valve leaflet mobility. Trace aortic regurgitation. Mild prolapse of the mitral valve leaflets. No significant myxomatous degeneration noted. Mild to moderate mitral regurgitation. Trace tricuspid regurgitation. Unable to estimate PA pressure due to absence/minimal TR signal. Compared to the study done on 05/14/2015, no significant change.  Lexiscan Myoview nuclear stress test 03/02/2015: 1. The resting electrocardiogram demonstrated normal sinus rhythm, IVCD, LVH, no resting arrhythmias and nonspecific ST-T changes. Stress EKG is non-diagnostic for ischemia as it a pharmacologic stress using  Lexiscan. Occasional PVC and atrial couplets and PAC. 2. Myocardial perfusion imaging is normal. Overall left ventricular systolic function was normal without regional wall motion abnormalities. The left ventricular ejection fraction was 54%.  Assessment & Recommendations:     ICD-10-CM   1. Essential hypertension  I10 atenolol (TENORMIN) 50 MG tablet  2. Atrial tachycardia (HCC)  I47.1 atenolol (TENORMIN) 50 MG tablet  3. Dyspnea on exertion  R06.00   4. Class 3 severe obesity due to excess calories without serious comorbidity with body mass index (BMI) of 40.0 to 44.9 in adult [facility])  E66.01    Z68.41     EKG 04/08/19/20: Normal sinus rhythm at the rate of 60 bpm, frequent PACs, 6 beat run of atrial tachycardia, LVH with repolarization abnormality.  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 No significant change from  EKG 11/23/2018. No significant change compared to 08/08/2018.  PACs more frequent.  Recommendation:   Ashley [name]  is a 74 y.o. female  with  morbid obesity, controlled DM, hypertension, chronic dyspnea on exertion, abnormal EKG with a negative nuclear stress test in 2016.   Patient is here on a 43-month office visit and follow-up of hypertension, palpitations, tolerating atenolol without side effects, previously she has not been able to tolerate any beta-blockers.  Symptoms are improved.  Blood pressure is also well controlled.  I have refilled atenolol.  She has started to exercise very vigorously and has lost about 35-388 Lbs since last OV. I given her positive reinforcement regarding weight loss.   OV in 6 months for f/u of hypertension and palpitations.   Yates Decamp, MD, [facility] 10/07/2019, 3:05 PM [facility] [place] Pager: [phone] Office: [phone] If no answer Cell [phone]

## 2019-10-15 ENCOUNTER — Other Ambulatory Visit: Payer: Self-pay | Admitting: Cardiology

## 2019-10-15 DIAGNOSIS — E119 Type 2 diabetes mellitus without complications: Secondary | ICD-10-CM | POA: Diagnosis not present

## 2019-10-15 DIAGNOSIS — I10 Essential (primary) hypertension: Secondary | ICD-10-CM

## 2019-10-15 DIAGNOSIS — R7309 Other abnormal glucose: Secondary | ICD-10-CM | POA: Diagnosis not present

## 2019-10-15 DIAGNOSIS — E559 Vitamin D deficiency, unspecified: Secondary | ICD-10-CM | POA: Diagnosis not present

## 2019-10-21 DIAGNOSIS — I10 Essential (primary) hypertension: Secondary | ICD-10-CM | POA: Diagnosis not present

## 2019-10-21 DIAGNOSIS — E559 Vitamin D deficiency, unspecified: Secondary | ICD-10-CM | POA: Diagnosis not present

## 2019-10-21 DIAGNOSIS — R7303 Prediabetes: Secondary | ICD-10-CM | POA: Diagnosis not present

## 2019-10-21 DIAGNOSIS — K219 Gastro-esophageal reflux disease without esophagitis: Secondary | ICD-10-CM | POA: Diagnosis not present

## 2019-10-21 NOTE — Progress Notes (Signed)
Labs 10/15/2019: Hb 13.1/HCT 40.0, platelets 182. Serum glucose 104 mg, BUN 10, creatinine 0.91, EGFR >60 mL.  Potassium 4.6.  CMP normal. Total cholesterol 162, triglycerides 81, HDL 70, LDL 77.  Vitamin D 49.5. A1c 6.3%.

## 2019-11-08 ENCOUNTER — Other Ambulatory Visit: Payer: Self-pay | Admitting: Cardiology

## 2019-11-08 DIAGNOSIS — I10 Essential (primary) hypertension: Secondary | ICD-10-CM

## 2019-11-18 DIAGNOSIS — M25512 Pain in left shoulder: Secondary | ICD-10-CM | POA: Diagnosis not present

## 2019-12-15 ENCOUNTER — Other Ambulatory Visit: Payer: Self-pay | Admitting: Cardiology

## 2020-04-04 ENCOUNTER — Inpatient Hospital Stay (HOSPITAL_COMMUNITY)
Admission: EM | Admit: 2020-04-04 | Discharge: 2020-04-06 | DRG: 064 | Disposition: A | Discharge status: Home / Self Care | Payer: Medicare Other | Attending: Internal Medicine | Admitting: Internal Medicine

## 2020-04-04 ENCOUNTER — Observation Stay (HOSPITAL_COMMUNITY): Payer: Medicare Other

## 2020-04-04 ENCOUNTER — Encounter (HOSPITAL_COMMUNITY): Payer: Self-pay | Admitting: Internal Medicine

## 2020-04-04 ENCOUNTER — Emergency Department (HOSPITAL_COMMUNITY): Payer: Medicare Other

## 2020-04-04 ENCOUNTER — Other Ambulatory Visit: Payer: Self-pay

## 2020-04-04 DIAGNOSIS — R002 Palpitations: Secondary | ICD-10-CM | POA: Diagnosis not present

## 2020-04-04 DIAGNOSIS — I6389 Other cerebral infarction: Secondary | ICD-10-CM | POA: Diagnosis not present

## 2020-04-04 DIAGNOSIS — Z03818 Encounter for observation for suspected exposure to other biological agents ruled out: Secondary | ICD-10-CM | POA: Diagnosis not present

## 2020-04-04 DIAGNOSIS — I63411 Cerebral infarction due to embolism of right middle cerebral artery: Secondary | ICD-10-CM | POA: Diagnosis not present

## 2020-04-04 DIAGNOSIS — G9341 Metabolic encephalopathy: Secondary | ICD-10-CM | POA: Diagnosis present

## 2020-04-04 DIAGNOSIS — I7090 Unspecified atherosclerosis: Secondary | ICD-10-CM | POA: Diagnosis not present

## 2020-04-04 DIAGNOSIS — E119 Type 2 diabetes mellitus without complications: Secondary | ICD-10-CM | POA: Diagnosis not present

## 2020-04-04 DIAGNOSIS — Z8249 Family history of ischemic heart disease and other diseases of the circulatory system: Secondary | ICD-10-CM | POA: Diagnosis not present

## 2020-04-04 DIAGNOSIS — Z20822 Contact with and (suspected) exposure to covid-19: Secondary | ICD-10-CM | POA: Diagnosis present

## 2020-04-04 DIAGNOSIS — H547 Unspecified visual loss: Secondary | ICD-10-CM | POA: Diagnosis not present

## 2020-04-04 DIAGNOSIS — I639 Cerebral infarction, unspecified: Secondary | ICD-10-CM | POA: Diagnosis not present

## 2020-04-04 DIAGNOSIS — I517 Cardiomegaly: Secondary | ICD-10-CM | POA: Diagnosis not present

## 2020-04-04 DIAGNOSIS — D696 Thrombocytopenia, unspecified: Secondary | ICD-10-CM | POA: Diagnosis present

## 2020-04-04 DIAGNOSIS — R9082 White matter disease, unspecified: Secondary | ICD-10-CM | POA: Diagnosis not present

## 2020-04-04 DIAGNOSIS — R778 Other specified abnormalities of plasma proteins: Secondary | ICD-10-CM

## 2020-04-04 DIAGNOSIS — I119 Hypertensive heart disease without heart failure: Secondary | ICD-10-CM | POA: Diagnosis not present

## 2020-04-04 DIAGNOSIS — R29810 Facial weakness: Secondary | ICD-10-CM | POA: Diagnosis not present

## 2020-04-04 DIAGNOSIS — R4182 Altered mental status, unspecified: Secondary | ICD-10-CM

## 2020-04-04 DIAGNOSIS — R911 Solitary pulmonary nodule: Secondary | ICD-10-CM | POA: Diagnosis present

## 2020-04-04 DIAGNOSIS — R0902 Hypoxemia: Secondary | ICD-10-CM | POA: Diagnosis not present

## 2020-04-04 DIAGNOSIS — I10 Essential (primary) hypertension: Secondary | ICD-10-CM | POA: Diagnosis not present

## 2020-04-04 DIAGNOSIS — J320 Chronic maxillary sinusitis: Secondary | ICD-10-CM | POA: Diagnosis present

## 2020-04-04 DIAGNOSIS — R569 Unspecified convulsions: Secondary | ICD-10-CM | POA: Diagnosis not present

## 2020-04-04 DIAGNOSIS — Z79899 Other long term (current) drug therapy: Secondary | ICD-10-CM

## 2020-04-04 DIAGNOSIS — R001 Bradycardia, unspecified: Secondary | ICD-10-CM

## 2020-04-04 DIAGNOSIS — I63419 Cerebral infarction due to embolism of unspecified middle cerebral artery: Secondary | ICD-10-CM

## 2020-04-04 DIAGNOSIS — Z6835 Body mass index (BMI) 35.0-35.9, adult: Secondary | ICD-10-CM

## 2020-04-04 DIAGNOSIS — I63511 Cerebral infarction due to unspecified occlusion or stenosis of right middle cerebral artery: Secondary | ICD-10-CM | POA: Diagnosis not present

## 2020-04-04 DIAGNOSIS — R29703 NIHSS score 3: Secondary | ICD-10-CM | POA: Diagnosis present

## 2020-04-04 DIAGNOSIS — J0100 Acute maxillary sinusitis, unspecified: Secondary | ICD-10-CM | POA: Diagnosis not present

## 2020-04-04 DIAGNOSIS — I491 Atrial premature depolarization: Secondary | ICD-10-CM | POA: Diagnosis not present

## 2020-04-04 DIAGNOSIS — R29818 Other symptoms and signs involving the nervous system: Secondary | ICD-10-CM | POA: Diagnosis not present

## 2020-04-04 DIAGNOSIS — G9389 Other specified disorders of brain: Secondary | ICD-10-CM | POA: Diagnosis not present

## 2020-04-04 DIAGNOSIS — R0602 Shortness of breath: Secondary | ICD-10-CM | POA: Diagnosis not present

## 2020-04-04 DIAGNOSIS — I6340 Cerebral infarction due to embolism of unspecified cerebral artery: Secondary | ICD-10-CM | POA: Insufficient documentation

## 2020-04-04 DIAGNOSIS — J3489 Other specified disorders of nose and nasal sinuses: Secondary | ICD-10-CM | POA: Diagnosis not present

## 2020-04-04 LAB — CBC WITH DIFFERENTIAL/PLATELET
Abs Immature Granulocytes: 0.03 10*3/uL (ref 0.00–0.07)
Basophils Absolute: 0 10*3/uL (ref 0.0–0.1)
Basophils Relative: 0 %
Eosinophils Absolute: 0 10*3/uL (ref 0.0–0.5)
Eosinophils Relative: 1 %
HCT: 38.7 % (ref 36.0–46.0)
Hemoglobin: 12.4 g/dL (ref 12.0–15.0)
Immature Granulocytes: 0 %
Lymphocytes Relative: 42 %
Lymphs Abs: 3 10*3/uL (ref 0.7–4.0)
MCH: 23.6 pg — ABNORMAL LOW (ref 26.0–34.0)
MCHC: 32 g/dL (ref 30.0–36.0)
MCV: 73.6 fL — ABNORMAL LOW (ref 80.0–100.0)
Monocytes Absolute: 0.5 10*3/uL (ref 0.1–1.0)
Monocytes Relative: 7 %
Neutro Abs: 3.6 10*3/uL (ref 1.7–7.7)
Neutrophils Relative %: 50 %
Platelets: UNDETERMINED 10*3/uL (ref 150–400)
RBC: 5.26 MIL/uL — ABNORMAL HIGH (ref 3.87–5.11)
RDW: 16 % — ABNORMAL HIGH (ref 11.5–15.5)
WBC: 7.2 10*3/uL (ref 4.0–10.5)
nRBC: 0 % (ref 0.0–0.2)

## 2020-04-04 LAB — COMPREHENSIVE METABOLIC PANEL
ALT: 30 U/L (ref 0–44)
AST: 40 U/L (ref 15–41)
Albumin: 3.4 g/dL — ABNORMAL LOW (ref 3.5–5.0)
Alkaline Phosphatase: 56 U/L (ref 38–126)
Anion gap: 10 (ref 5–15)
BUN: 6 mg/dL — ABNORMAL LOW (ref 8–23)
CO2: 25 mmol/L (ref 22–32)
Calcium: 9.1 mg/dL (ref 8.9–10.3)
Chloride: 105 mmol/L (ref 98–111)
Creatinine, Ser: 0.87 mg/dL (ref 0.44–1.00)
GFR calc Af Amer: >60 mL/min (ref >60)
GFR calc non Af Amer: >60 mL/min (ref >60)
Glucose, Bld: 117 mg/dL — ABNORMAL HIGH (ref 70–99)
Potassium: 3.8 mmol/L (ref 3.5–5.1)
Sodium: 140 mmol/L (ref 135–145)
Total Bilirubin: 0.8 mg/dL (ref 0.3–1.2)
Total Protein: 6.8 g/dL (ref 6.5–8.1)

## 2020-04-04 LAB — URINALYSIS, COMPLETE (UACMP) WITH MICROSCOPIC
Bilirubin Urine: NEGATIVE
Glucose, UA: NEGATIVE mg/dL
Ketones, ur: NEGATIVE mg/dL
Leukocytes,Ua: NEGATIVE
Nitrite: NEGATIVE
Protein, ur: NEGATIVE mg/dL
Specific Gravity, Urine: 1.01 (ref 1.005–1.030)
pH: 8 (ref 5.0–8.0)

## 2020-04-04 LAB — I-STAT CHEM 8, ED
BUN: 9 mg/dL (ref 8–23)
Calcium, Ion: 1.13 mmol/L — ABNORMAL LOW (ref 1.15–1.40)
Chloride: 101 mmol/L (ref 98–111)
Creatinine, Ser: 0.8 mg/dL (ref 0.44–1.00)
Glucose, Bld: 115 mg/dL — ABNORMAL HIGH (ref 70–99)
HCT: 44 % (ref 36.0–46.0)
Hemoglobin: 15 g/dL (ref 12.0–15.0)
Potassium: 3.8 mmol/L (ref 3.5–5.1)
Sodium: 141 mmol/L (ref 135–145)
TCO2: 29 mmol/L (ref 22–32)

## 2020-04-04 LAB — BRAIN NATRIURETIC PEPTIDE: B Natriuretic Peptide: 738.8 pg/mL — ABNORMAL HIGH (ref 0.0–100.0)

## 2020-04-04 LAB — HEMOGLOBIN A1C
Hgb A1c MFr Bld: 6.2 % — ABNORMAL HIGH (ref 4.8–5.6)
Mean Plasma Glucose: 131.24 mg/dL

## 2020-04-04 LAB — TROPONIN I (HIGH SENSITIVITY)
Troponin I (High Sensitivity): 68 ng/L — ABNORMAL HIGH (ref <18)
Troponin I (High Sensitivity): 75 ng/L — ABNORMAL HIGH (ref <18)

## 2020-04-04 LAB — SARS CORONAVIRUS 2 BY RT PCR (HOSPITAL ORDER, PERFORMED IN ~~LOC~~ HOSPITAL LAB): SARS Coronavirus 2: NEGATIVE

## 2020-04-04 LAB — PHOSPHORUS: Phosphorus: 2.5 mg/dL (ref 2.5–4.6)

## 2020-04-04 LAB — CBG MONITORING, ED: Glucose-Capillary: 122 mg/dL — ABNORMAL HIGH (ref 70–99)

## 2020-04-04 LAB — TSH: TSH: 2.452 u[IU]/mL (ref 0.350–4.500)

## 2020-04-04 LAB — MAGNESIUM: Magnesium: 1.7 mg/dL (ref 1.7–2.4)

## 2020-04-04 MED ORDER — ACETAMINOPHEN 325 MG PO TABS
650.0000 mg | ORAL_TABLET | Freq: Four times a day (QID) | ORAL | Status: DC | PRN
  Administered 2020-04-06: 650 mg via ORAL
  Filled 2020-04-04: qty 2

## 2020-04-04 MED ORDER — ATORVASTATIN CALCIUM 80 MG PO TABS
80.0000 mg | ORAL_TABLET | Freq: Every day | ORAL | Status: DC
  Administered 2020-04-05: 80 mg via ORAL
  Filled 2020-04-04 (×3): qty 1

## 2020-04-04 MED ORDER — POLYETHYLENE GLYCOL 3350 17 G PO PACK: 17.0000 g | PACK | Freq: Every day | ORAL | Status: DC | PRN

## 2020-04-04 MED ORDER — ENOXAPARIN SODIUM 40 MG/0.4ML ~~LOC~~ SOLN
40.0000 mg | Freq: Every day | SUBCUTANEOUS | Status: DC
  Administered 2020-04-05 (×2): 40 mg via SUBCUTANEOUS
  Filled 2020-04-04 (×2): qty 0.4

## 2020-04-04 MED ORDER — MAGNESIUM SULFATE 2 GM/50ML IV SOLN
2.0000 g | Freq: Once | INTRAVENOUS | Status: AC
  Administered 2020-04-04: 2 g via INTRAVENOUS

## 2020-04-04 MED ORDER — ONDANSETRON HCL 4 MG PO TABS: 4.0000 mg | ORAL_TABLET | Freq: Four times a day (QID) | ORAL | Status: DC | PRN

## 2020-04-04 MED ORDER — ACETAMINOPHEN 650 MG RE SUPP: 650.0000 mg | Freq: Four times a day (QID) | RECTAL | Status: DC | PRN

## 2020-04-04 MED ORDER — ONDANSETRON HCL 4 MG/2ML IJ SOLN: 4.0000 mg | Freq: Four times a day (QID) | INTRAMUSCULAR | Status: DC | PRN

## 2020-04-04 MED ORDER — CLONIDINE HCL 0.1 MG PO TABS
0.1000 mg | ORAL_TABLET | Freq: Two times a day (BID) | ORAL | Status: DC
  Administered 2020-04-05 – 2020-04-06 (×3): 0.1 mg via ORAL
  Filled 2020-04-04 (×4): qty 1

## 2020-04-04 MED ORDER — INSULIN ASPART 100 UNIT/ML ~~LOC~~ SOLN
0.0000 [IU] | Freq: Three times a day (TID) | SUBCUTANEOUS | Status: DC
  Administered 2020-04-05: 2 [IU] via SUBCUTANEOUS

## 2020-04-04 MED ORDER — ASPIRIN 300 MG RE SUPP
300.0000 mg | Freq: Every day | RECTAL | Status: DC
  Filled 2020-04-04: qty 1

## 2020-04-04 MED ORDER — SODIUM BICARBONATE 8.4 % IV SOLN
50.0000 meq | Freq: Once | INTRAVENOUS | Status: AC
  Administered 2020-04-04: 50 meq via INTRAVENOUS

## 2020-04-04 MED ORDER — ASPIRIN EC 81 MG PO TBEC: 81.0000 mg | DELAYED_RELEASE_TABLET | Freq: Every day | ORAL | Status: DC

## 2020-04-04 MED ORDER — ASPIRIN 325 MG PO TABS
325.0000 mg | ORAL_TABLET | Freq: Every day | ORAL | Status: DC
  Administered 2020-04-05 – 2020-04-06 (×3): 325 mg via ORAL
  Filled 2020-04-04 (×3): qty 1

## 2020-04-04 MED ORDER — STROKE: EARLY STAGES OF RECOVERY BOOK
Freq: Once | Status: AC
  Filled 2020-04-04: qty 1

## 2020-04-04 MED ORDER — HYDRALAZINE HCL 20 MG/ML IJ SOLN
10.0000 mg | Freq: Four times a day (QID) | INTRAMUSCULAR | Status: DC | PRN
  Administered 2020-04-04: 10 mg via INTRAVENOUS
  Filled 2020-04-04: qty 1

## 2020-04-04 MED ORDER — HYDROCHLOROTHIAZIDE 12.5 MG PO CAPS
12.5000 mg | ORAL_CAPSULE | Freq: Every day | ORAL | Status: DC
  Administered 2020-04-05 – 2020-04-06 (×2): 12.5 mg via ORAL
  Filled 2020-04-04 (×2): qty 1

## 2020-04-04 MED ORDER — LACTATED RINGERS IV SOLN: INTRAVENOUS | Status: AC

## 2020-04-04 NOTE — Consult Note (Addendum)
CARDIOLOGY CONSULT NOTE  Patient ID: Ashley [name] MRN: [id number] DOB/AGE: 20-Feb-1946 74 y.o.  Admit date: 04/04/2020 Referring Physician: [facility] Reason for Consultation:  Bradycardia  HPI:   74 y.o. African American female  with hypertension, type 2 DM, obesity, h/o atrial tachycardia, presented to [facility] ED with confusion, found to be bradycardic.  Patient was at home today. She had been grieving loss of a close friend two days ago. Her son called to check on her and noted that she was "not sounding and acting herself". He came over to see her and found her to be "fidgety" and "trying to open a bottle that did not have a lid". He thus brought her to the hospital. He and his daughter Para March (present at bedside) still thinks that she is trying to "put up a face, but not acting herself".   Specific questioning, patient denies any chest pain, shortness of breath, lightheadedness, presyncope, B, palpitations.  She told me that she took her usual atenolol 50 mg which she takes once a day, earlier today. There has been no change in her medication dosages.  While in the ED, patient has had elevated blood pressure, heart rate occasionally in 30s.  Telemetry and EKGs reviewed, details below.  Work-up showed no significant metabolic derangements.  High-sensitivity troponin was elevated at 68.   Past Medical History:  Diagnosis Date  . Controlled diabetes mellitus type II without complication (HCC) 11/23/2018  . Diabetes mellitus without complication (HCC)   . Essential hypertension 11/23/2018  . Hypertension   . Obesity 11/23/2018     No past surgical history on file.   Family History  Problem Relation Age of Onset  . Stroke Mother   . Hypertension Mother      Social History: Social History   Socioeconomic History  . Marital status: Married    Spouse name: Not on file  . Number of children: 1  . Years of education: Not on file  . Highest education level: Not on file   Occupational History  . Not on file  Tobacco Use  . Smoking status: Never Smoker  . Smokeless tobacco: Never Used  Substance and Sexual Activity  . Alcohol use: No  . Drug use: No  . Sexual activity: Not on file  Other Topics Concern  . Not on file  Social History Narrative  . Not on file   Social Determinants of Health   Financial Resource Strain:   . Difficulty of Paying Living Expenses:   Food Insecurity:   . Worried About Programme researcher, broadcasting/film/video in the Last Year:   . Barista in the Last Year:   Transportation Needs:   . Freight forwarder (Medical):   Marland Kitchen Lack of Transportation (Non-Medical):   Physical Activity:   . Days of Exercise per Week:   . Minutes of Exercise per Session:   Stress:   . Feeling of Stress :   Social Connections:   . Frequency of Communication with Friends and Family:   . Frequency of Social Gatherings with Friends and Family:   . Attends Religious Services:   . Active Member of Clubs or Organizations:   . Attends Banker Meetings:   Marland Kitchen Marital Status:   Intimate Partner Violence:   . Fear of Current or Ex-Partner:   . Emotionally Abused:   Marland Kitchen Physically Abused:   . Sexually Abused:      (Not in a hospital admission)   Review  of Systems  Constitutional: Negative for decreased appetite, malaise/fatigue, weight gain and weight loss.  HENT: Negative for congestion.   Eyes: Negative for visual disturbance.  Cardiovascular: Negative for dyspnea on exertion, leg swelling, palpitations and syncope.  Respiratory: Negative for cough.   Endocrine: Negative for cold intolerance.  Hematologic/Lymphatic: Does not bruise/bleed easily.  Skin: Negative for itching and rash.  Musculoskeletal: Negative for myalgias.  Gastrointestinal: Negative for abdominal pain, nausea and vomiting.  Genitourinary: Negative for dysuria.  Neurological: Negative for dizziness and weakness.       Mild confusion  Psychiatric/Behavioral: The patient  is not nervous/anxious.   All other systems reviewed and are negative.     Physical Exam: Physical Exam Vitals and nursing note reviewed.  Constitutional:      General: She is not in acute distress.    Appearance: She is well-developed.  HENT:     Head: Normocephalic and atraumatic.  Eyes:     Conjunctiva/sclera: Conjunctivae normal.     Pupils: Pupils are equal, round, and reactive to light.  Neck:     Vascular: No JVD.  Cardiovascular:     Rate and Rhythm: Regular rhythm. Bradycardia present. Frequent extrasystoles are present.    Pulses: Normal pulses and intact distal pulses.     Heart sounds: No murmur heard.   Pulmonary:     Effort: Pulmonary effort is normal.     Breath sounds: Normal breath sounds. No wheezing or rales.  Abdominal:     General: Bowel sounds are normal.     Palpations: Abdomen is soft.     Tenderness: There is no rebound.  Musculoskeletal:        General: No tenderness. Normal range of motion.     Left lower leg: No edema.  Lymphadenopathy:     Cervical: No cervical adenopathy.  Skin:    General: Skin is warm and dry.  Neurological:     General: No focal deficit present.     Mental Status: She is alert. She is confused.     Cranial Nerves: No cranial nerve deficit.     Comments: Mildly confused.  States year is 2002.  Also tries to rationalize why the year is 2002. No other neuro deficit.      Labs:   Lab Results  Component Value Date   WBC 7.2 04/04/2020   HGB 15.0 04/04/2020   HCT 44.0 04/04/2020   MCV 73.6 (L) 04/04/2020   PLT PLATELET CLUMPS NOTED ON SMEAR, UNABLE TO ESTIMATE 04/04/2020    Recent Labs  Lab 04/04/20 1925 04/04/20 1925 04/04/20 1946  NA 140   < > 141  K 3.8   < > 3.8  CL 105   < > 101  CO2 25  --   --   BUN 6*   < > 9  CREATININE 0.87   < > 0.80  CALCIUM 9.1  --   --   PROT 6.8  --   --   BILITOT 0.8  --   --   ALKPHOS 56  --   --   ALT 30  --   --   AST 40  --   --   GLUCOSE 117*   < > 115*   < > =  values in this interval not displayed.      Radiology: [facility] Chest Port 1 View  Result Date: 04/04/2020 CLINICAL DATA:  Shortness of breath EXAM: PORTABLE CHEST 1 VIEW COMPARISON:  October 12, 2014 FINDINGS: There is  unchanged cardiomegaly. Both lungs are clear. The visualized skeletal structures are unremarkable. IMPRESSION: No active disease. Electronically Signed   By: Jonna Clark M.D.   On: 04/04/2020 19:44   Results for NEETU, CARROZZA (MRN [id number]) as of 04/04/2020 21:28  Ref. Range 04/04/2020 19:25  Troponin I (High Sensitivity) Latest Ref Range: <18 ng/L 68 (H)   Scheduled Meds: Continuous Infusions: PRN Meds:.  CARDIAC STUDIES:  Serial EKGs showing sinus rhythm sinus bradycardia, rate 30-60 bpm, with frequent PACs. Inferolateral T wave inversions, not new.  Telemetry reviewed.  No high-grade AV block, sinus pauses, asystole.  Compared with previous EKGs dating back to as early as 2015.  Echocardiogram 07/16/2018:  Left ventricle cavity is normal in size. Moderate concentric hypertrophy of the left ventricle. Normal global wall motion. Doppler evidence of grade II (pseudonormal) diastolic dysfunction. Diastolic dysfunction findings suggests elevated LA/LV end diastolic pressure. Calculated EF 55%. Left atrial cavity is severely dilated in apical views, measures 4.7 cm in ling axis views. Mild aortic valve leaflet thickening. Normal aortic valve leaflet mobility. Trace aortic regurgitation. Mild prolapse of the mitral valve leaflets. No significant myxomatous degeneration noted. Mild to moderate mitral regurgitation. Trace tricuspid regurgitation. Unable to estimate PA pressure due to absence/minimal TR signal. Compared to the study done on 05/14/2015, no significant change.  Lexiscan Myoview nuclear stress test 03/02/2015: 1. The resting electrocardiogram demonstrated normal sinus rhythm, IVCD, LVH, no resting arrhythmias and nonspecific ST-T changes. Stress EKG is  non-diagnostic for ischemia as it a pharmacologic stress using Lexiscan. Occasional PVC and atrial couplets and PAC. 2. Myocardial perfusion imaging is normal. Overall left ventricular systolic function was normal without regional wall motion abnormalities. The left ventricular ejection fraction was 54%.  Assessment & Recommendations:  74 y.o. African American female  with hypertension, type 2 DM, obesity, h/o atrial tachycardia, presented to [facility] ED with confusion, found to be bradycardic.  Bradycardia: Reviewed serial EKGs, telemetry and compared with old EKGs.  She does have sinus bradycardia with intermittent PACs.  She has had similar changes dating back to as early as 2015, albeit rate is slightly slower today.  However, her presentation with confusion without any hemodynamic instability, presyncope or syncope is not explained entirely by her bradycardia. Recommend holding atenolol.  I am not suspecting beta-blocker overdose, therefore, do not recommend glucagon.  No indication for urgent temporary pacemaker placement.  Continue medical observation.  Defer any other neurological work-up, if necessary, to the primary team.  Troponin elevation: In absence of chest pain or new ischemic changes, likely nonspecific finding.  Obtain serial high-sensitivity troponin.  If no significant delta, do not recommend ischemic work-up at this time.  Patient has scheduled appointment with her primary cardiologist Dr. Jacinto Halim on 04/08/2020, for further work-up could be discussed as necessary.  Confusion: Not convinced that her confusion is entirely related to bradycardia.  There is a note by RN regarding "lost consciousness".  However, I verified this with the ED physician.  There is no definite witnessed syncope or loss of consciousness noted during her entire time here in the ED.   Component of grief, with possible hypoglycemia while at home could be contributing.  Medical observation is  reasonable.  Elder Negus, MD 04/04/2020, 9:24 PM [facility] Pager: [phone] Office: [phone] If no answer Cell [phone]

## 2020-04-04 NOTE — ED Triage Notes (Signed)
Pt arrives via EMS with complaints of AMS. Pt son called EMS stating my mom is just not acting right. Pt is alert and oriented X4. During triage pt "went unconscious" for about 30 seconds. HR noted 25-30

## 2020-04-04 NOTE — H&P (Signed)
History and Physical    Ashley DiceMollie Bettie Bredeson [name]:[id number]:[id number] DOB: 05/16/1946 DOA: 04/04/2020  PCP: Merri BrunettePharr, Walter, MD  Patient coming from: home   Chief Complaint:  Chief Complaint  Patient presents with  . Altered Mental Status     HPI:    74 year old female with past medical history of diabetes mellitus type 2, obesity, history of atrial tachycardia, hypertension who presents to [facility]  emergency department with family due to concerns for confusion.  Patient is an extremely poor historian and therefore a full history could not be entirely obtained. History has been obtained from a combination of both the patient and the son who is at the bedside. Son explains that for approximately past 24 hours patient and been exhibiting confusion and lethargy with associated poor appetite.  Upon questioning the patient, she denies shortness of breath, cough, fever, sick contacts, lightheadedness, abdominal pain, dysuria. Patient denies any new medications or herbal supplements. Patient denies any recent travel, sick contacts or confirmed contact with known COVID-19 infection.  Upon being evaluated in the emergency department, patient was incidentally found to be exhibiting severe bradycardia with heart rates as low as the 30s. The emergency department provider called Dr. Rosemary HolmsPatwardhan with cardiology who graciously evaluated the patient at the bedside. It was recommended that the patient's atenolol be held with close monitoring on telemetry. He did not necessarily feel that the patient's lethargy and confusion was entirely secondary to the bradycardia and recommended a separate encephalopathy work-up. The hospitalist group was then called to assess patient for mission the hospital.   Review of Systems: Unable to obtain due to lethargy and confusion.   Past Medical History:  Diagnosis Date  . Controlled diabetes mellitus type II without complication (HCC) 11/23/2018  . Diabetes mellitus  without complication (HCC)   . Essential hypertension 11/23/2018  . Obesity 11/23/2018    No past surgical history on file.   reports that she has never smoked. She has never used smokeless tobacco. She reports that she does not drink alcohol and does not use drugs.  Allergies  Allergen Reactions  . Bystolic [Nebivolol Hcl]     Bradycardia   . Coreg [Carvedilol]     bradycardia  . Minoxidil     Abdominal pain    Family History  Problem Relation Age of Onset  . Stroke Mother   . Hypertension Mother      Prior to Admission medications   Medication Sig Start Date End Date Taking? Authorizing Provider  acetaminophen (TYLENOL) 500 MG tablet Take 500 mg by mouth daily as needed for mild pain or headache.   Yes [provider]  atenolol (TENORMIN) 50 MG tablet Take 1 tablet (50 mg total) by mouth 2 (two) times daily. Patient taking differently: Take 50 mg by mouth daily.  10/07/19  Yes Yates DecampGanji, Jay, MD  cetirizine (ZYRTEC) 10 MG tablet Take 10 mg by mouth daily as needed for allergies.   Yes [provider]  Cholecalciferol (VITAMIN D3) 5000 units CAPS Take 1 capsule by mouth daily.   Yes [provider]  cloNIDine (CATAPRES) 0.2 MG tablet TAKE 1 TABLET (0.2 MG TOTAL) BY MOUTH 2 (TWO) TIMES DAILY. Patient taking differently: Take 0.2 mg by mouth daily.  11/08/19  Yes Yates DecampGanji, Jay, MD  diphenhydrAMINE (BENADRYL) 12.5 MG/5ML liquid Take 12.5 mg by mouth at bedtime as needed for itching or allergies.   Yes [provider]  valsartan-hydrochlorothiazide (DIOVAN-HCT) 320-12.5 MG tablet TAKE 1 TABLET BY MOUTH EVERY DAY Patient  taking differently: Take 1 tablet by mouth daily.  12/16/19  Yes Yates Decamp, MD  aspirin 81 MG tablet Take 81 mg by mouth. occasionally    [provider]    Physical Exam: Vitals:   04/04/20 1919 04/04/20 1922 04/04/20 2035 04/04/20 2204  BP:  (!) 155/89 (!) 157/124 (!) 146/81  Pulse: (!) 53 (!) 32 (!) 33 (!) 38  Resp: 16 17  (!) 22 17  Temp: 98.6 F (37 C)     TempSrc: Oral     SpO2: 100% 98% 95% 98%  Weight:      Height:        Constitutional: Lethargic but arousable, oriented x2, no associated distress. Patient is obese.  Skin: no rashes, no lesions, slightly poor skin turgor noted. Eyes: Pupils are equally reactive to light.  No evidence of scleral icterus or conjunctival pallor.  ENMT: Slightly dry mucous membranes noted.  Posterior pharynx clear of any exudate or lesions.   Neck: normal, supple, no masses, no thyromegaly.  No evidence of jugular venous distension.   Respiratory: No wheezing, no crackles. Normal respiratory effort. No accessory muscle use.  Cardiovascular: Bradycardic heart rate with 2 out of 6 systolic murmur noted,  No extremity edema. 2+ pedal pulses. No carotid bruits.  Chest:   Nontender without crepitus or deformity.   Back:   Nontender without crepitus or deformity. Abdomen: Abdomen is soft and nontender.  No evidence of intra-abdominal masses.  Positive bowel sounds noted in all quadrants.   Musculoskeletal: No joint deformity upper and lower extremities. Good ROM, no contractures. Normal muscle tone.  Neurologic: Patient is lethargic but arousable and oriented x2. CN 2-12 grossly intact. Sensation intact, patient is moving all 4 extremities spontaneously with strength 5 out of 5 in all muscle groups. Patient is following all commands. Patient is responsive to verbal stimuli.   Psychiatric: Extremely odd affect with normal mood. Patient is visibly confused throughout the interview. Patient currently does not seem to possess insight as to her current situation.    Labs on Admission: I have personally reviewed following labs and imaging studies -   CBC: Recent Labs  Lab 04/04/20 1925 04/04/20 1946  WBC 7.2  --   NEUTROABS 3.6  --   HGB 12.4 15.0  HCT 38.7 44.0  MCV 73.6*  --   PLT PLATELET CLUMPS NOTED ON SMEAR, UNABLE TO ESTIMATE  --    Basic Metabolic Panel: Recent Labs   Lab 04/04/20 1925 04/04/20 1946  NA 140 141  K 3.8 3.8  CL 105 101  CO2 25  --   GLUCOSE 117* 115*  BUN 6* 9  CREATININE 0.87 0.80  CALCIUM 9.1  --   MG 1.7  --   PHOS 2.5  --    GFR: Estimated Creatinine Clearance: 80.1 mL/min (by C-G formula based on SCr of 0.8 mg/dL). Liver Function Tests: Recent Labs  Lab 04/04/20 1925  AST 40  ALT 30  ALKPHOS 56  BILITOT 0.8  PROT 6.8  ALBUMIN 3.4*   No results for input(s): LIPASE, AMYLASE in the last 168 hours. No results for input(s): AMMONIA in the last 168 hours. Coagulation Profile: No results for input(s): INR, PROTIME in the last 168 hours. Cardiac Enzymes: No results for input(s): CKTOTAL, CKMB, CKMBINDEX, TROPONINI in the last 168 hours. BNP (last 3 results) No results for input(s): PROBNP in the last 8760 hours. HbA1C: No results for input(s): HGBA1C in the last 72 hours. CBG: Recent Labs  Lab 04/04/20 1909  GLUCAP 122*   Lipid Profile: No results for input(s): CHOL, HDL, LDLCALC, TRIG, CHOLHDL, LDLDIRECT in the last 72 hours. Thyroid Function Tests: No results for input(s): TSH, T4TOTAL, FREET4, T3FREE, THYROIDAB in the last 72 hours. Anemia Panel: No results for input(s): VITAMINB12, FOLATE, FERRITIN, TIBC, IRON, RETICCTPCT in the last 72 hours. Urine analysis: No results found for: COLORURINE, APPEARANCEUR, LABSPEC, PHURINE, GLUCOSEU, HGBUR, BILIRUBINUR, KETONESUR, PROTEINUR, UROBILINOGEN, NITRITE, LEUKOCYTESUR  Radiological Exams on Admission - Personally Reviewed: DG Chest Port 1 View  Result Date: 04/04/2020 CLINICAL DATA:  Shortness of breath EXAM: PORTABLE CHEST 1 VIEW COMPARISON:  October 12, 2014 FINDINGS: There is unchanged cardiomegaly. Both lungs are clear. The visualized skeletal structures are unremarkable. IMPRESSION: No active disease. Electronically Signed   By: Jonna Clark M.D.   On: 04/04/2020 19:44    EKG: Personally reviewed.  Rhythm is severe sinus bradycardia with PACs with heart  rate of 56 bpm with left bundle branch block.  No dynamic ST segment changes appreciated.  Assessment/Plan Principal Problem:   Acute metabolic encephalopathy   Patient is exhibiting lethargy, confusion and disorientation according to history and on my examination.  Patient does not exhibit any focal neurologic findings on examination  Because of this encephalopathy is not entirely clear at this time. Cardiology does not feel that this degree of bradycardia is causing patient's changes in mentation.  In addition to withholding beta-blocker and reducing carvedilol dosing, we are performing a comprehensive encephalopathy work-up including obtaining vitamin B12, folate, TSH, ammonia, RPR, urine toxicology screen, urinalysis and CT imaging of the head.  Hydrating patient gently with intravenous isotonic fluids  Monitoring for symptomatic improvement  No clinical evidence of meningitis  Active Problems:   Sinus bradycardia   Patient's home regimen of atenolol has been discontinued  Reducing home regimen of clonidine from 0.2 mg twice daily to 0.1 mg twice daily. I do not want to completely discontinue this due to concern for rebound hypertension.  Cardiology (Dr. Rosemary Holms) evaluated patients and recommendations are appreciated. They do not feel that this degree of bradycardia is causing patient's confusion.  We will continue to monitor patient on telemetry, cardiology feels that medical telemetry unit is an adequate acuity for this patient.    Essential hypertension   As mentioned above, discontinuing atenolol, reducing home regimen of clonidine 0.1 mg twice daily  Continuing home regimen of valsartan and hydrochlorothiazide  Providing patient with as needed intravenous antihypertensives for excessively elevated blood pressures.    Controlled diabetes mellitus type II without complication (HCC)   Accu-Cheks before every meal and nightly with sliding scale  insulin  Hemoglobin A1c pending    Elevated troponin level not due myocardial infarction  Cardiology feels that patient is unlikely to be suffering from plaque rupture/ACS  Patient is chest pain-free  Cycling cardiac enzymes per cardiology recommendations  Monitoring patient on telemetry  Continue home regimen of daily aspirin    Code Status:  Full code Family Communication: Son is at bedside and has been updated on plan of care  Status is: Observation  The patient remains OBS appropriate and will d/c before 2 midnights.  Dispo: The patient is from: Home              Anticipated d/c is to: Home              Anticipated d/c date is: 2 days              Patient currently is  not medically stable to d/c.        Marinda Elk MD [facility] Pager [phone]  If 7PM-7AM, please contact night-coverage [email or web address] Use universal [place] password for that web site. If you do not have the password, please call the hospital operator.  04/04/2020, 10:44 PM

## 2020-04-04 NOTE — Consult Note (Signed)
Referring Physician: Dr. Leafy Half    Chief Complaint: AMS  HPI: Shandiin Eisenbeis is an 74 y.o. female with a PMHx of DM2, HTN, atrial tachycardia and obesity, who presented to the ED via EMS on Saturday evening for evaluation of AMS. Her son had called her at 72 AM on Saturday and she complained of a headache at that time; her voice sounded a little abnormal at that time. When her son called her back at about 3 PM, he noted that her speech did not sound normal. He then went to her house and noted that she was not behaving normally. For example, he noted her to be "fidgety" and had been trying to open a bottle that did not have a lid. The son then called EMS. On arrival to the ED, she was alert and oriented x 4. However, during triage the patient appeared to lose consciousness for about 30 seconds, during which time she was bradycardic with a HR of 25-30. The patient does take atenolol. She endorsed losing a close friend last week which resulted in stress to the patient - she has been sleeping and eating less. LKN was on Friday between 2:30 and 3 PM when her son spoke with her over the telephone.   Cardiology evaluated the patient and felt that her presentation was not due to a cardiac etiology. She was admitted for persistent AMS and bradycardia of unknown cause.   She has been with waxing and waning mental status in the ED, able to answer questions about her symptoms and medications at some times and disoriented at others. At the time of Neurology consultation she is lucid and fully oriented. She states that she takes ASA only occasionally. She has never had a stroke before.   Labs revealed elevated Troponin I (70) and BNP (738). Vitamin B12 is normal. TSH normal. HgbA1C 6.2. Tox screen negative.   CT head revealed a right lateral temporal lobe cortically based hypodensity suspicious for a subacute ischemic infarction.   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 Otherwise negative CTA of the head and neck. No other large vessel occlusion, hemodynamically significant stenosis, or other acute vascular abnormality.  LSN: Friday between 2:30 and 3 PM tPA Given: No: Out of the tPA time window  Past Medical History:  Diagnosis Date  . Controlled diabetes mellitus type II without complication (HCC) 11/23/2018  . Diabetes mellitus without complication (HCC)   . Essential hypertension 11/23/2018  . Obesity 11/23/2018    No past surgical history on file.  Family History  Problem Relation Age of Onset  . Stroke Mother   . Hypertension Mother    Social History:  reports that she has never smoked. She has never used smokeless tobacco. She reports that she does not drink alcohol and does not use drugs.  Allergies:  Allergies  Allergen Reactions  . Bystolic [Nebivolol Hcl]     Bradycardia   . Coreg [Carvedilol]     bradycardia  . Minoxidil     Abdominal pain    Medications:  Scheduled: . aspirin  300 mg Rectal Daily   Or  . aspirin  325 mg Oral Daily  . atorvastatin  80 mg Oral q1800  . cloNIDine  0.1 mg Oral BID  . enoxaparin (LOVENOX) injection  40 mg Subcutaneous QHS  . insulin aspart  0-15 Units Subcutaneous TID AC & HS  . LORazepam  1  mg Intravenous On Call  . valsartan-hydrochlorothiazide  1 tablet Oral Daily   Continuous: . lactated ringers 75 mL/hr at 04/04/20 2311    ROS: Complains of bilateral leg cramping and feeling cold. Has a perceived sensation of a tender "knot" along her right thigh. Other symptoms as per HPI, with no additional complaints endorsed by the patient.   Physical Examination: Blood pressure (!) 183/79, pulse (!) 32, temperature 98.6 F (37 C), temperature source Oral, resp. rate (!) 21, height 5\' 8"  (1.727 m), weight 106.6 kg, SpO2 96 %.  General: Morbid obesity is noted.  HEENT: [place]/AT. Lungs: Respirations unlabored Ext: No  edema.  Neurologic Examination: Mental Status: Alert, fully oriented, thought content appropriate.  Speech fluent with intact comprehension and naming.  Able to follow a 3 step command without difficulty. Cranial Nerves: II:  Visual fields intact when tested individually. However, there is extinction on the left to DSS.  III,IV, VI: No ptosis. EOMI.  V,VII: Smile symmetric, facial temp sensation equal bilaterally VIII: Hearing intact to conversation IX,X: No hypophonia XI: Symmetric XII: midline tongue extension  Motor: Right : Upper extremity   5/5    Left:     Upper extremity   5/5  Lower extremity   5/5     Lower extremity   5/5 No pronator drift.  Sensory: Temp and light touch sensation intact when tested in all 4 extremities individually. There is extinction on the left to DSS.  Deep Tendon Reflexes:  1+ bilateral brachioradialis and biceps in the context of prominent adiposity Deferred patellars due to cold legs, which are wrapped up in blankets Achilles trace bilaterally  Plantars: Right: downgoing   Left: downgoing Cerebellar: No ataxia with FNF bilaterally  Gait: Deferred  Results for orders placed or performed during the hospital encounter of 04/04/20 (from the past 48 hour(s))  CBG monitoring, ED     Status: Abnormal   Collection Time: 04/04/20  7:09 PM  Result Value Ref Range   Glucose-Capillary 122 (H) 70 - 99 mg/dL    Comment: Glucose reference range applies only to samples taken after fasting for at least 8 hours.  CBC with Differential/Platelet     Status: Abnormal   Collection Time: 04/04/20  7:25 PM  Result Value Ref Range   WBC 7.2 4.0 - 10.5 K/uL    Comment: WHITE COUNT CONFIRMED ON SMEAR   RBC 5.26 (H) 3.87 - 5.11 MIL/uL   Hemoglobin 12.4 12.0 - 15.0 g/dL   HCT 04/06/20 36 - 46 %   MCV 73.6 (L) 80.0 - 100.0 fL   MCH 23.6 (L) 26.0 - 34.0 pg   MCHC 32.0 30.0 - 36.0 g/dL   RDW 71.0 (H) 62.6 - 94.8 %   Platelets PLATELET CLUMPS NOTED ON SMEAR, UNABLE TO  ESTIMATE 150 - 400 K/uL   nRBC 0.0 0.0 - 0.2 %   Neutrophils Relative % 50 %   Neutro Abs 3.6 1.7 - 7.7 K/uL   Lymphocytes Relative 42 %   Lymphs Abs 3.0 0.7 - 4.0 K/uL   Monocytes Relative 7 %   Monocytes Absolute 0.5 0 - 1 K/uL   Eosinophils Relative 1 %   Eosinophils Absolute 0.0 0 - 0 K/uL   Basophils Relative 0 %   Basophils Absolute 0.0 0 - 0 K/uL   RBC Morphology MORPHOLOGY UNREMARKABLE    Immature Granulocytes 0 %   Abs Immature Granulocytes 0.03 0.00 - 0.07 K/uL    Comment: Performed at [facility],  [address], [place], [place] [address]  Comprehensive metabolic panel     Status: Abnormal   Collection Time: 04/04/20  7:25 PM  Result Value Ref Range   Sodium 140 135 - 145 mmol/L   Potassium 3.8 3.5 - 5.1 mmol/L   Chloride 105 98 - 111 mmol/L   CO2 25 22 - 32 mmol/L   Glucose, Bld 117 (H) 70 - 99 mg/dL    Comment: Glucose reference range applies only to samples taken after fasting for at least 8 hours.   BUN 6 (L) 8 - 23 mg/dL   Creatinine, Ser 2.70 0.44 - 1.00 mg/dL   Calcium 9.1 8.9 - 62.3 mg/dL   Total Protein 6.8 6.5 - 8.1 g/dL   Albumin 3.4 (L) 3.5 - 5.0 g/dL   AST 40 15 - 41 U/L   ALT 30 0 - 44 U/L   Alkaline Phosphatase 56 38 - 126 U/L   Total Bilirubin 0.8 0.3 - 1.2 mg/dL   GFR calc non Af Amer >60 >60 mL/min   GFR calc Af Amer >60 >60 mL/min   Anion gap 10 5 - 15    Comment: Performed at [facility], [address] [place], [place] [address]  Troponin I (High Sensitivity)     Status: Abnormal   Collection Time: 04/04/20  7:25 PM  Result Value Ref Range   Troponin I (High Sensitivity) 68 (H) <18 ng/L    Comment: (NOTE) Elevated high sensitivity troponin I (hsTnI) values and significant  changes across serial measurements may suggest ACS but many other  chronic and acute conditions are known to elevate hsTnI results.  Refer to the "Links" section for chest pain algorithms and additional  guidance. Performed at [facility],  [address], [place], [place] [address]   Magnesium     Status: None   Collection Time: 04/04/20  7:25 PM  Result Value Ref Range   Magnesium 1.7 1.7 - 2.4 mg/dL    Comment: Performed at [facility], [address] [place], [place] [address]  Phosphorus     Status: None   Collection Time: 04/04/20  7:25 PM  Result Value Ref Range   Phosphorus 2.5 2.5 - 4.6 mg/dL    Comment: Performed at [facility], [address] [place], [place] [address]  SARS Coronavirus 2 by RT PCR (hospital order, performed in [facility] hospital lab) Nasopharyngeal Nasopharyngeal Swab     Status: None   Collection Time: 04/04/20  7:25 PM   Specimen: Nasopharyngeal Swab  Result Value Ref Range   SARS Coronavirus 2 NEGATIVE NEGATIVE    Comment: (NOTE) SARS-CoV-2 target nucleic acids are NOT DETECTED.  The SARS-CoV-2 RNA is generally detectable in upper and lower respiratory specimens during the acute phase of infection. The lowest concentration of SARS-CoV-2 viral copies this assay can detect is 250 copies / mL. A negative result does not preclude SARS-CoV-2 infection and should not be used as the sole basis for treatment or other patient management decisions.  A negative result may occur with improper specimen collection / handling, submission of specimen other than nasopharyngeal swab, presence of viral mutation(s) within the areas targeted by this assay, and inadequate number of viral copies (<250 copies / mL). A negative result must be combined with clinical observations, patient history, and epidemiological information.  Fact Sheet for Patients:   BoilerBrush.com.cy  Fact Sheet for Healthcare Providers: https://pope.com/  This test is not yet approved  or  cleared by the [place] and has been authorized for detection and/or diagnosis of SARS-CoV-2 by FDA under an Emergency Use Authorization (EUA).  This EUA will remain in effect  (meaning this test can be used) for the duration of the COVID-19 declaration under Section 564(b)(1) of the Act, 21 U.S.C. section 360bbb-3(b)(1), unless the authorization is terminated or revoked sooner.  Performed at [facility], [address] [place], [place] [address]   Brain natriuretic peptide     Status: Abnormal   Collection Time: 04/04/20  7:25 PM  Result Value Ref Range   B Natriuretic Peptide 738.8 (H) 0.0 - 100.0 pg/mL    Comment: Performed at [facility], [address], [place], [place] [address]  TSH     Status: None   Collection Time: 04/04/20  7:25 PM  Result Value Ref Range   TSH 2.452 0.350 - 4.500 uIU/mL    Comment: Performed by a 3rd Generation assay with a functional sensitivity of <=0.01 uIU/mL. Performed at [facility], [address], [place], [place] [address]   Hemoglobin A1c     Status: Abnormal   Collection Time: 04/04/20  7:25 PM  Result Value Ref Range   Hgb A1c MFr Bld 6.2 (H) 4.8 - 5.6 %    Comment: (NOTE) Pre diabetes:          5.7%-6.4%  Diabetes:              >6.4%  Glycemic control for   <7.0% adults with diabetes    Mean Plasma Glucose 131.24 mg/dL    Comment: Performed at [facility], [address] [place], [place] [address]  I-stat chem 8, ED (not at [facility] or [facility])     Status: Abnormal   Collection Time: 04/04/20  7:46 PM  Result Value Ref Range   Sodium 141 135 - 145 mmol/L   Potassium 3.8 3.5 - 5.1 mmol/L   Chloride 101 98 - 111 mmol/L   BUN 9 8 - 23 mg/dL   Creatinine, Ser 8.11 0.44 - 1.00 mg/dL   Glucose, Bld 572 (H) 70 - 99 mg/dL    Comment: Glucose reference range applies only to samples taken after fasting for at least 8 hours.   Calcium, Ion 1.13 (L) 1.15 - 1.40 mmol/L   TCO2 29 22 - 32 mmol/L   Hemoglobin 15.0 12.0 - 15.0 g/dL   HCT 62.0 36 - 46 %  Troponin I (High Sensitivity)     Status: Abnormal   Collection Time: 04/04/20  9:40 PM  Result Value Ref Range   Troponin I (High  Sensitivity) 75 (H) <18 ng/L    Comment: (NOTE) Elevated high sensitivity troponin I (hsTnI) values and significant  changes across serial measurements may suggest ACS but many other  chronic and acute conditions are known to elevate hsTnI results.  Refer to the "Links" section for chest pain algorithms and additional  guidance. Performed at [facility], [address], [place], [place] [address]    CT HEAD WO CONTRAST  Result Date: 04/04/2020 CLINICAL DATA:  Mental status change EXAM: CT HEAD WITHOUT CONTRAST TECHNIQUE: Contiguous axial images were obtained from the base of the skull through the vertex without intravenous contrast. COMPARISON:  MRI 06/10/2009, paranasal CT 10/13/2009 FINDINGS: Brain: Loss of gray-white differentiation and ill-defined hypoattenuation in the right temporal lobe concerning for an age-indeterminate infarct not present on comparison imaging from 20/10, 2011. No other  CT evident areas of acute vascular territory or cortically based infarct. No hemorrhage, hydrocephalus, extra-axial collection or mass lesion/mass effect. Symmetric prominence of the ventricles, cisterns and sulci compatible with parenchymal volume loss. Additional patchy areas of white matter hypoattenuation are most compatible with chronic microvascular angiopathy. Vascular: Atherosclerotic calcification of the carotid siphons. No hyperdense vessel. Skull: No calvarial fracture or suspicious osseous lesion. No scalp swelling or hematoma. Sinuses/Orbits: Few pneumatized secretions noted in the left maxillary sinus. Extensive periapical lucencies of the maxillary dentition including a left maxillary molar possibly suggesting an odontogenic origin of the paranasal sinus disease. Included orbital structures are unremarkable. Other: None. IMPRESSION: 1. Loss of gray-white differentiation geographic region of ill-defined hypoattenuation in the right temporal lobe concerning for age-indeterminate infarct,  possibly late acute to subacute. 2. No other acute intracranial abnormality. 3. Chronic microvascular angiopathy and parenchymal volume loss. 4. Extensive periapical lucencies of the maxillary dentition including a left maxillary molar possibly suggesting an odontogenic origin of left maxillary sinus disease. Correlate with dental exam. Critical Value/emergent results were called by telephone at the time of interpretation on 04/04/2020 at 11:10 pm to provider [facility] , who verbally acknowledged these results. Electronically Signed   By: Kreg ShropshirePrice  DeHay M.D.   On: 04/04/2020 23:07   DG Chest Port 1 View  Result Date: 04/04/2020 CLINICAL DATA:  Shortness of breath EXAM: PORTABLE CHEST 1 VIEW COMPARISON:  October 12, 2014 FINDINGS: There is unchanged cardiomegaly. Both lungs are clear. The visualized skeletal structures are unremarkable. IMPRESSION: No active disease. Electronically Signed   By: Jonna ClarkBindu  Avutu M.D.   On: 04/04/2020 19:44    Assessment: 74 y.o. female presenting with AMS. Subacute right temporal lobe ischemic infarction is seen on CT.  1. Exam reveals left sided extinction to tactile DSS as well as when testing visual fields. No motor deficit is appreciated.  2. 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 Otherwise negative CTA of the head and neck. No other large vessel occlusion, hemodynamically significant stenosis, or other acute vascular abnormality. 3. Stroke Risk Factors - DM, HTN and obesity  Recommendations: 1. HgbA1c, fasting lipid panel 2. MRI brain with and without contrast.  3. BP management. Out of the permissive HTN time window.  4. TTE 5. Cardiac telemetry 6. Continue ASA and atorvastatin. 7. Risk factor modification 8. Telemetry monitoring 9. Frequent neuro checks 10. PT consult, OT consult, Speech consult   @Electronically  signed: Dr. Caryl PinaEric Alynah Schone@ 04/04/2020,  11:44 PM

## 2020-04-04 NOTE — ED Provider Notes (Signed)
[facility]   CSN: [id number] Arrival date & time: 04/04/20  1908     History Chief Complaint  Patient presents with  . Altered Mental Status    Ashley Donalee CitrinBettie [name] is a 74 y.o. female who presents via EMS for altered mental status per son. Noted to have some improvement following some PO intake.  On arrival patient noted to be bradycardic with HR 20s.  Patient states she took her normal dose of atenolol this morning.  Patient denies chest pain, shortness of breath.  Patient denies other presenting symptoms. Pt endorsed losing close friend last week that has caused stress. Patient sleeping less, eating less due to stress from this event.   The history is provided by the patient and the EMS personnel. The history is limited by the condition of the patient.  Altered Mental Status Presenting symptoms: confusion   Associated symptoms: no abdominal pain and no headaches        Past Medical History:  Diagnosis Date  . Controlled diabetes mellitus type II without complication (HCC) 11/23/2018  . Diabetes mellitus without complication (HCC)   . Essential hypertension 11/23/2018  . Obesity 11/23/2018    Patient Active Problem List   Diagnosis Date Noted  . Sinus bradycardia 04/04/2020  . Acute metabolic encephalopathy 04/04/2020  . Elevated troponin level not due myocardial infarction 04/04/2020  . Essential hypertension 11/23/2018  . Controlled diabetes mellitus type II without complication (HCC) 11/23/2018  . Obesity 11/23/2018    No past surgical history on file.   OB History   No obstetric history on file.     Family History  Problem Relation Age of Onset  . Stroke Mother   . Hypertension Mother     Social History   Tobacco Use  . Smoking status: Never Smoker  . Smokeless tobacco: Never Used  Substance Use Topics  . Alcohol use: No  . Drug use: No    Home Medications Prior to Admission medications     Medication Sig Start Date End Date Taking? Authorizing Provider  acetaminophen (TYLENOL) 500 MG tablet Take 500 mg by mouth daily as needed for mild pain or headache.   Yes [provider]  aspirin 81 MG tablet Take 81 mg by mouth. occasionally   Yes [provider]  atenolol (TENORMIN) 50 MG tablet Take 1 tablet (50 mg total) by mouth 2 (two) times daily. Patient taking differently: Take 50 mg by mouth daily.  10/07/19  Yes Yates DecampGanji, Jay, MD  cetirizine (ZYRTEC) 10 MG tablet Take 10 mg by mouth daily as needed for allergies.   Yes [provider]  Cholecalciferol (VITAMIN D3) 5000 units CAPS Take 1 capsule by mouth daily.   Yes [provider]  cloNIDine (CATAPRES) 0.2 MG tablet TAKE 1 TABLET (0.2 MG TOTAL) BY MOUTH 2 (TWO) TIMES DAILY. Patient taking differently: Take 0.2 mg by mouth daily.  11/08/19  Yes Yates DecampGanji, Jay, MD  diphenhydrAMINE (BENADRYL) 12.5 MG/5ML liquid Take 12.5 mg by mouth at bedtime as needed for itching or allergies.   Yes [provider]  valsartan-hydrochlorothiazide (DIOVAN-HCT) 320-12.5 MG tablet TAKE 1 TABLET BY MOUTH EVERY DAY Patient taking differently: Take 1 tablet by mouth daily.  12/16/19  Yes Yates DecampGanji, Jay, MD    Allergies    Bystolic [nebivolol hcl], Coreg [carvedilol], and Minoxidil  Review of Systems   Review of Systems  Unable to perform ROS: Mental status change  Cardiovascular: Negative for chest pain.  Gastrointestinal: Negative for abdominal pain.  Neurological: Negative for headaches.  Psychiatric/Behavioral: Positive for confusion.    Physical Exam Updated Vital Signs BP (!) 183/79   Pulse (!) 32   Temp 98.6 F (37 C) (Oral)   Resp (!) 21   Ht 5\' 8"  (1.727 m)   Wt 106.6 kg   SpO2 96%   BMI 35.73 kg/m   Physical Exam Vitals and nursing note reviewed.  Constitutional:      General: She is in acute distress.     Appearance: She is well-developed. She is obese. She is ill-appearing. She is not  toxic-appearing or diaphoretic.  HENT:     Head: Normocephalic and atraumatic.     Mouth/Throat:     Mouth: Mucous membranes are moist.  Eyes:     Conjunctiva/sclera: Conjunctivae normal.  Cardiovascular:     Rate and Rhythm: Normal rate and regular rhythm.  Pulmonary:     Effort: Pulmonary effort is normal. No respiratory distress.     Breath sounds: Normal breath sounds.  Abdominal:     Palpations: Abdomen is soft.     Tenderness: There is no abdominal tenderness.  Musculoskeletal:     Cervical back: Neck supple.     Right lower leg: Edema present.     Left lower leg: Edema present.     Comments: +1 pitting edema bilaterally   Skin:    General: Skin is warm and dry.  Neurological:     Mental Status: She is alert. She is disoriented.     ED Results / Procedures / Treatments   Labs (all labs ordered are listed, but only abnormal results are displayed) Labs Reviewed  CBC WITH DIFFERENTIAL/PLATELET - Abnormal; Notable for the following components:      Result Value   RBC 5.26 (*)    MCV 73.6 (*)    MCH 23.6 (*)    RDW 16.0 (*)    All other components within normal limits  COMPREHENSIVE METABOLIC PANEL - Abnormal; Notable for the following components:   Glucose, Bld 117 (*)    BUN 6 (*)    Albumin 3.4 (*)    All other components within normal limits  BRAIN NATRIURETIC PEPTIDE - Abnormal; Notable for the following components:   B Natriuretic Peptide 738.8 (*)    All other components within normal limits  URINALYSIS, COMPLETE (UACMP) WITH MICROSCOPIC - Abnormal; Notable for the following components:   Color, Urine STRAW (*)    Hgb urine dipstick SMALL (*)    Bacteria, UA RARE (*)    All other components within normal limits  HEMOGLOBIN A1C - Abnormal; Notable for the following components:   Hgb A1c MFr Bld 6.2 (*)    All other components within normal limits  CBG MONITORING, ED - Abnormal; Notable for the following components:   Glucose-Capillary 122 (*)    All  other components within normal limits  I-STAT CHEM 8, ED - Abnormal; Notable for the following components:   Glucose, Bld 115 (*)    Calcium, Ion 1.13 (*)    All other components within normal limits  TROPONIN I (HIGH SENSITIVITY) - Abnormal; Notable for the following components:   Troponin I (High Sensitivity) 68 (*)    All other components within normal limits  TROPONIN I (HIGH SENSITIVITY) - Abnormal; Notable for the following components:   Troponin I (High Sensitivity) 75 (*)    All other components within normal limits  SARS CORONAVIRUS 2 BY RT PCR (HOSPITAL ORDER, PERFORMED  IN [facility] LAB)  URINE CULTURE  MAGNESIUM  PHOSPHORUS  TSH  VITAMIN B12  FOLATE  AMMONIA  RPR  MAGNESIUM  CBC WITH DIFFERENTIAL/PLATELET  COMPREHENSIVE METABOLIC PANEL  APTT  PROTIME-INR  RAPID URINE DRUG SCREEN, HOSP PERFORMED  LIPID PANEL    EKG EKG Interpretation  Date/Time:  Saturday April 04 2020 19:57:59 EDT Ventricular Rate:  35 PR Interval:    QRS Duration: 159 QT Interval:  626 QTC Calculation: 478 R Axis:   53 Text Interpretation: Sinus rhythm Supraventricular bigeminy Left bundle branch block No significant change since Confirmed by Richardean Canal [phone]) on 04/04/2020 8:05:54 PM   Radiology CT HEAD WO CONTRAST  Result Date: 04/04/2020 CLINICAL DATA:  Mental status change EXAM: CT HEAD WITHOUT CONTRAST TECHNIQUE: Contiguous axial images were obtained from the base of the skull through the vertex without intravenous contrast. COMPARISON:  MRI 06/10/2009, paranasal CT 10/13/2009 FINDINGS: Brain: Loss of gray-white differentiation and ill-defined hypoattenuation in the right temporal lobe concerning for an age-indeterminate infarct not present on comparison imaging from 20/10, 2011. No other CT evident areas of acute vascular territory or cortically based infarct. No hemorrhage, hydrocephalus, extra-axial collection or mass lesion/mass effect. Symmetric prominence of the  ventricles, cisterns and sulci compatible with parenchymal volume loss. Additional patchy areas of white matter hypoattenuation are most compatible with chronic microvascular angiopathy. Vascular: Atherosclerotic calcification of the carotid siphons. No hyperdense vessel. Skull: No calvarial fracture or suspicious osseous lesion. No scalp swelling or hematoma. Sinuses/Orbits: Few pneumatized secretions noted in the left maxillary sinus. Extensive periapical lucencies of the maxillary dentition including a left maxillary molar possibly suggesting an odontogenic origin of the paranasal sinus disease. Included orbital structures are unremarkable. Other: None. IMPRESSION: 1. Loss of gray-white differentiation geographic region of ill-defined hypoattenuation in the right temporal lobe concerning for age-indeterminate infarct, possibly late acute to subacute. 2. No other acute intracranial abnormality. 3. Chronic microvascular angiopathy and parenchymal volume loss. 4. Extensive periapical lucencies of the maxillary dentition including a left maxillary molar possibly suggesting an odontogenic origin of left maxillary sinus disease. Correlate with dental exam. Critical Value/emergent results were called by telephone at the time of interpretation on 04/04/2020 at 11:10 pm to provider [facility] , who verbally acknowledged these results. Electronically Signed   By: Kreg Shropshire M.D.   On: 04/04/2020 23:07   DG Chest Port 1 View  Result Date: 04/04/2020 CLINICAL DATA:  Shortness of breath EXAM: PORTABLE CHEST 1 VIEW COMPARISON:  October 12, 2014 FINDINGS: There is unchanged cardiomegaly. Both lungs are clear. The visualized skeletal structures are unremarkable. IMPRESSION: No active disease. Electronically Signed   By: Jonna Clark M.D.   On: 04/04/2020 19:44    Procedures Procedures (including critical care time)  Medications Ordered in ED Medications  lactated ringers infusion ( Intravenous New Bag/Given  04/04/20 2311)  valsartan-hydrochlorothiazide (DIOVAN-HCT) 320-12.5 MG per tablet 1 tablet (has no administration in time range)  cloNIDine (CATAPRES) tablet 0.1 mg (0.1 mg Oral Not Given 04/04/20 2244)  hydrALAZINE (APRESOLINE) injection 10 mg (10 mg Intravenous Given 04/04/20 2314)  polyethylene glycol (MIRALAX / GLYCOLAX) packet 17 g (has no administration in time range)  ondansetron (ZOFRAN) tablet 4 mg (has no administration in time range)    Or  ondansetron (ZOFRAN) injection 4 mg (has no administration in time range)  enoxaparin (LOVENOX) injection 40 mg (has no administration in time range)  acetaminophen (TYLENOL) tablet 650 mg (has no administration in time range)    Or  acetaminophen (TYLENOL) suppository 650 mg (has no administration in time range)  insulin aspart (novoLOG) injection 0-15 Units (has no administration in time range)   stroke: mapping our early stages of recovery book (has no administration in time range)  aspirin suppository 300 mg (has no administration in time range)    Or  aspirin tablet 325 mg (has no administration in time range)  atorvastatin (LIPITOR) tablet 80 mg (has no administration in time range)  magnesium sulfate IVPB 2 g 50 mL (0 g Intravenous Stopped 04/04/20 2009)  sodium bicarbonate injection 50 mEq (50 mEq Intravenous Given 04/04/20 1919)    ED Course  I have reviewed the triage vital signs and the nursing notes.  Pertinent labs & imaging results that were available during my care of the patient were reviewed by me and considered in my medical decision making (see chart for details).    MDM Rules/Calculators/A&P                          Patient is a 74 year old female w PMHx significant for DM2, HTN, Obesity who presents via EMS for altered mental status, and found to be bradycardic with HR 20s.  Patient states her son was concerned that she was confused and wanted her to be evaluated in ED.  Patient denies chest pain, shortness of breath.  Pt  does endorse increased stress, decreased PO and sleep in the setting of recent loss of friend this week. Patient states she follows with Dr. Jacinto Halim. Chart review demonstrates treatment for palpitations, HTN. Medication given >1 yr ago. Initial EKG significant for sinus bradycardia with intermittent PVCs. Concern for long QT and QRS intervals so IV mag and bicarb were initiated. BG 122. One repeat EKG concerning for possible a flutter however multiple further EKGs were taken without this concern and was not present on future EKGs. CXR wo acute cardiopulmonary abnormalities. On exam patient afebrile, Bradycardic with HR 20s-30s with stable BP. On exam pt with pitting edema to b/l LE. Lungs clear to auscultation. No cardiac murmur appreciated. Abdomen soft, non tender. No focal findings on neuro exam. Pt oriented to self, location but generally still confused about situation, symptoms. Cardiology was quickly consulted, and Dr. Rosemary Holms evaluated pt at bedside. CBC, CMP wo significant abnormalities. Initial trop mildly elevated to 68 however doubt ACS as cause of presentation based on history, lack or chest pain, EKG wo findings consistent w acute ischemia. Mag, phos wnl. BNP elevated from baseline at 738. TSH wnl. COVID negative.  After evaluation by cardiology at bedside, do not believe cardiac cause to presentation.  Cardiology evaluated EKGs, patient and does not believe that bradycardia requires urgent intervention such as venous pacemaker.  Plan to admit to hospitalist service with cardiology comanaging.  Will admit to hospital for persistent AMS and bradycardia of unknown cause.  Patient admitted.     Final Clinical Impression(s) / ED Diagnoses Final diagnoses:  Altered mental status, unspecified altered mental status type  Bradycardia    Rx / DC Orders ED Discharge Orders    None       Golden Pop, MD 04/04/20 2351    Charlynne Pander, MD 04/05/20 1754

## 2020-04-04 NOTE — ED Notes (Signed)
Patient transported to CT 

## 2020-04-04 NOTE — ED Notes (Signed)
Medication not verified at this time.  

## 2020-04-05 ENCOUNTER — Observation Stay (HOSPITAL_COMMUNITY): Payer: Medicare Other

## 2020-04-05 ENCOUNTER — Inpatient Hospital Stay (HOSPITAL_COMMUNITY): Payer: Medicare Other

## 2020-04-05 DIAGNOSIS — Z6835 Body mass index (BMI) 35.0-35.9, adult: Secondary | ICD-10-CM | POA: Diagnosis not present

## 2020-04-05 DIAGNOSIS — I7090 Unspecified atherosclerosis: Secondary | ICD-10-CM | POA: Diagnosis not present

## 2020-04-05 DIAGNOSIS — Z20822 Contact with and (suspected) exposure to covid-19: Secondary | ICD-10-CM | POA: Diagnosis present

## 2020-04-05 DIAGNOSIS — R778 Other specified abnormalities of plasma proteins: Secondary | ICD-10-CM | POA: Diagnosis not present

## 2020-04-05 DIAGNOSIS — R29818 Other symptoms and signs involving the nervous system: Secondary | ICD-10-CM | POA: Diagnosis not present

## 2020-04-05 DIAGNOSIS — R29703 NIHSS score 3: Secondary | ICD-10-CM | POA: Diagnosis present

## 2020-04-05 DIAGNOSIS — R002 Palpitations: Secondary | ICD-10-CM | POA: Diagnosis not present

## 2020-04-05 DIAGNOSIS — R569 Unspecified convulsions: Secondary | ICD-10-CM | POA: Diagnosis not present

## 2020-04-05 DIAGNOSIS — I10 Essential (primary) hypertension: Secondary | ICD-10-CM | POA: Diagnosis not present

## 2020-04-05 DIAGNOSIS — G9341 Metabolic encephalopathy: Secondary | ICD-10-CM | POA: Diagnosis not present

## 2020-04-05 DIAGNOSIS — I6389 Other cerebral infarction: Secondary | ICD-10-CM | POA: Diagnosis not present

## 2020-04-05 DIAGNOSIS — R001 Bradycardia, unspecified: Secondary | ICD-10-CM | POA: Diagnosis not present

## 2020-04-05 DIAGNOSIS — J320 Chronic maxillary sinusitis: Secondary | ICD-10-CM | POA: Diagnosis present

## 2020-04-05 DIAGNOSIS — Z79899 Other long term (current) drug therapy: Secondary | ICD-10-CM | POA: Diagnosis not present

## 2020-04-05 DIAGNOSIS — Z8249 Family history of ischemic heart disease and other diseases of the circulatory system: Secondary | ICD-10-CM | POA: Diagnosis not present

## 2020-04-05 DIAGNOSIS — D696 Thrombocytopenia, unspecified: Secondary | ICD-10-CM | POA: Diagnosis present

## 2020-04-05 DIAGNOSIS — R9082 White matter disease, unspecified: Secondary | ICD-10-CM | POA: Diagnosis not present

## 2020-04-05 DIAGNOSIS — R911 Solitary pulmonary nodule: Secondary | ICD-10-CM | POA: Diagnosis present

## 2020-04-05 DIAGNOSIS — I63411 Cerebral infarction due to embolism of right middle cerebral artery: Secondary | ICD-10-CM | POA: Diagnosis present

## 2020-04-05 DIAGNOSIS — G9389 Other specified disorders of brain: Secondary | ICD-10-CM | POA: Diagnosis not present

## 2020-04-05 DIAGNOSIS — H547 Unspecified visual loss: Secondary | ICD-10-CM | POA: Diagnosis present

## 2020-04-05 DIAGNOSIS — I63511 Cerebral infarction due to unspecified occlusion or stenosis of right middle cerebral artery: Secondary | ICD-10-CM | POA: Diagnosis not present

## 2020-04-05 DIAGNOSIS — I6340 Cerebral infarction due to embolism of unspecified cerebral artery: Secondary | ICD-10-CM | POA: Insufficient documentation

## 2020-04-05 DIAGNOSIS — I119 Hypertensive heart disease without heart failure: Secondary | ICD-10-CM | POA: Diagnosis present

## 2020-04-05 DIAGNOSIS — E119 Type 2 diabetes mellitus without complications: Secondary | ICD-10-CM | POA: Diagnosis not present

## 2020-04-05 DIAGNOSIS — I639 Cerebral infarction, unspecified: Secondary | ICD-10-CM | POA: Diagnosis not present

## 2020-04-05 LAB — CBC WITH DIFFERENTIAL/PLATELET
Abs Immature Granulocytes: 0.02 10*3/uL (ref 0.00–0.07)
Basophils Absolute: 0 10*3/uL (ref 0.0–0.1)
Basophils Relative: 0 %
Eosinophils Absolute: 0 10*3/uL (ref 0.0–0.5)
Eosinophils Relative: 0 %
HCT: 40.1 % (ref 36.0–46.0)
Hemoglobin: 12.8 g/dL (ref 12.0–15.0)
Immature Granulocytes: 0 %
Lymphocytes Relative: 33 %
Lymphs Abs: 2.9 10*3/uL (ref 0.7–4.0)
MCH: 23.6 pg — ABNORMAL LOW (ref 26.0–34.0)
MCHC: 31.9 g/dL (ref 30.0–36.0)
MCV: 73.8 fL — ABNORMAL LOW (ref 80.0–100.0)
Monocytes Absolute: 0.7 10*3/uL (ref 0.1–1.0)
Monocytes Relative: 8 %
Neutro Abs: 5.2 10*3/uL (ref 1.7–7.7)
Neutrophils Relative %: 59 %
Platelets: 133 10*3/uL — ABNORMAL LOW (ref 150–400)
RBC: 5.43 MIL/uL — ABNORMAL HIGH (ref 3.87–5.11)
RDW: 16 % — ABNORMAL HIGH (ref 11.5–15.5)
WBC: 8.9 10*3/uL (ref 4.0–10.5)
nRBC: 0 % (ref 0.0–0.2)

## 2020-04-05 LAB — PROTIME-INR
INR: 1.1 (ref 0.8–1.2)
Prothrombin Time: 14.1 seconds (ref 11.4–15.2)

## 2020-04-05 LAB — LIPID PANEL
Cholesterol: 163 mg/dL (ref 0–200)
HDL: 63 mg/dL (ref >40)
LDL Cholesterol: 87 mg/dL (ref 0–99)
Total CHOL/HDL Ratio: 2.6 RATIO
Triglycerides: 64 mg/dL (ref <150)
VLDL: 13 mg/dL (ref 0–40)

## 2020-04-05 LAB — COMPREHENSIVE METABOLIC PANEL
ALT: 43 U/L (ref 0–44)
AST: 64 U/L — ABNORMAL HIGH (ref 15–41)
Albumin: 3.5 g/dL (ref 3.5–5.0)
Alkaline Phosphatase: 61 U/L (ref 38–126)
Anion gap: 12 (ref 5–15)
BUN: 6 mg/dL — ABNORMAL LOW (ref 8–23)
CO2: 26 mmol/L (ref 22–32)
Calcium: 9.3 mg/dL (ref 8.9–10.3)
Chloride: 102 mmol/L (ref 98–111)
Creatinine, Ser: 0.85 mg/dL (ref 0.44–1.00)
GFR calc Af Amer: >60 mL/min (ref >60)
GFR calc non Af Amer: >60 mL/min (ref >60)
Glucose, Bld: 124 mg/dL — ABNORMAL HIGH (ref 70–99)
Potassium: 3.5 mmol/L (ref 3.5–5.1)
Sodium: 140 mmol/L (ref 135–145)
Total Bilirubin: 0.7 mg/dL (ref 0.3–1.2)
Total Protein: 7 g/dL (ref 6.5–8.1)

## 2020-04-05 LAB — CBG MONITORING, ED
Glucose-Capillary: 125 mg/dL — ABNORMAL HIGH (ref 70–99)
Glucose-Capillary: 107 mg/dL — ABNORMAL HIGH (ref 70–99)

## 2020-04-05 LAB — AMMONIA: Ammonia: 23 umol/L (ref 9–35)

## 2020-04-05 LAB — ECHOCARDIOGRAM COMPLETE BUBBLE STUDY
Area-P 1/2: 4.85 cm2
S' Lateral: 4.1 cm

## 2020-04-05 LAB — APTT: aPTT: 29 seconds (ref 24–36)

## 2020-04-05 LAB — RAPID URINE DRUG SCREEN, HOSP PERFORMED
Amphetamines: NOT DETECTED
Barbiturates: NOT DETECTED
Benzodiazepines: NOT DETECTED
Cocaine: NOT DETECTED
Opiates: NOT DETECTED
Tetrahydrocannabinol: NOT DETECTED

## 2020-04-05 LAB — RPR: RPR Ser Ql: NONREACTIVE

## 2020-04-05 LAB — MAGNESIUM: Magnesium: 2.1 mg/dL (ref 1.7–2.4)

## 2020-04-05 LAB — TROPONIN I (HIGH SENSITIVITY): Troponin I (High Sensitivity): 70 ng/L — ABNORMAL HIGH (ref <18)

## 2020-04-05 LAB — FOLATE: Folate: 13.5 ng/mL (ref >5.9)

## 2020-04-05 LAB — GLUCOSE, CAPILLARY
Glucose-Capillary: 130 mg/dL — ABNORMAL HIGH (ref 70–99)
Glucose-Capillary: 120 mg/dL — ABNORMAL HIGH (ref 70–99)

## 2020-04-05 LAB — VITAMIN B12: Vitamin B-12: 344 pg/mL (ref 180–914)

## 2020-04-05 MED ORDER — GADOBUTROL 1 MMOL/ML IV SOLN
10.0000 mL | Freq: Once | INTRAVENOUS | Status: AC | PRN
  Administered 2020-04-05: 10 mL via INTRAVENOUS

## 2020-04-05 MED ORDER — IRBESARTAN 300 MG PO TABS
300.0000 mg | ORAL_TABLET | Freq: Every day | ORAL | Status: DC
  Administered 2020-04-05 – 2020-04-06 (×2): 300 mg via ORAL
  Filled 2020-04-05 (×2): qty 1

## 2020-04-05 MED ORDER — DILTIAZEM HCL ER 60 MG PO CP12: 120.0000 mg | ORAL_CAPSULE | Freq: Two times a day (BID) | ORAL | Status: DC

## 2020-04-05 MED ORDER — PERFLUTREN LIPID MICROSPHERE
1.0000 mL | INTRAVENOUS | Status: AC | PRN
  Administered 2020-04-05: 2 mL via INTRAVENOUS
  Filled 2020-04-05: qty 10

## 2020-04-05 MED ORDER — LORAZEPAM 2 MG/ML IJ SOLN
1.0000 mg | INTRAMUSCULAR | Status: AC
  Administered 2020-04-05: 1 mg via INTRAVENOUS
  Filled 2020-04-05 (×2): qty 1

## 2020-04-05 MED ORDER — SODIUM CHLORIDE 0.9% FLUSH
9.0000 mL | Freq: Once | INTRAVENOUS | Status: AC
  Administered 2020-04-05: 9 mL via INTRAVENOUS

## 2020-04-05 MED ORDER — IOHEXOL 350 MG/ML SOLN
100.0000 mL | Freq: Once | INTRAVENOUS | Status: AC | PRN
  Administered 2020-04-05: 100 mL via INTRAVENOUS

## 2020-04-05 NOTE — Progress Notes (Signed)
STROKE TEAM PROGRESS NOTE   HISTORY OF PRESENT ILLNESS (per record) Ashley [name] is an 74 y.o. female with a PMHx of DM2, HTN, atrial tachycardia and obesity, who presented to the ED via EMS on Saturday evening for evaluation of AMS. Her son had called her at 53 AM on Saturday and she complained of a headache at that time; her voice sounded a little abnormal at that time. When her son called her back at about 3 PM, he noted that her speech did not sound normal. He then went to her house and noted that she was not behaving normally. For example, he noted her to be "fidgety" and had been trying to open a bottle that did not have a lid. The son then called EMS. On arrival to the ED, she was alert and oriented x 4. However, during triage the patient appeared to lose consciousness for about 30 seconds, during which time she was bradycardic with a HR of 25-30. The patient does take atenolol. She endorsed losing a close friend last week which resulted in stress to the patient - she has been sleeping and eating less. LKN was on Friday between 2:30 and 3 PM when her son spoke with her over the telephone.  Cardiology evaluated the patient and felt that her presentation was not due to a cardiac etiology. She was admitted for persistent AMS and bradycardia of unknown cause.   She has been with waxing and waning mental status in the ED, able to answer questions about her symptoms and medications at some times and disoriented at others. At the time of Neurology consultation she is lucid and fully oriented. She states that she takes ASA only occasionally. She has never had a stroke before.  Labs revealed elevated Troponin I (70) and BNP (738). Vitamin B12 is normal. TSH normal. HgbA1C 6.2. Tox screen negative.  CT head revealed a right lateral temporal lobe cortically based hypodensity suspicious for a subacute ischemic infarction.  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 Otherwise negative CTA of the head and neck. No other large vessel occlusion, hemodynamically significant stenosis, or other acute vascular abnormality. LSN: Friday between 2:30 and 3 PM tPA Given: No: Out of the tPA time window   INTERVAL HISTORY I have personally reviewed history of present illness with the patient and her son was present at the bedside as well as electronic medical records and imaging films in PACS.  She presented with fluctuating mental status and confusion since yesterday afternoon and CT scan shows right temporal hypodensity with CT angiogram showing right M2 stenosis in that area likely suggesting embolic stroke.  LDL cholesterol 87 mg percent.  Hemoglobin A1c 6.2.  Echocardiogram shows normal ejection fraction and no cardiac source of embolism., MRI and EEG are pending.   OBJECTIVE Vitals:   04/05/20 0844 04/05/20 0859 04/05/20 0929 04/05/20 0944  BP: (!) 167/79 (!) 187/85 (!) 183/90 (!) 183/73  Pulse: (!) 25 (!) 32 (!) 31 (!) 41  Resp: (!) 21 18 20 20   Temp:      TempSrc:      SpO2: 98% 93% 97% 90%  Weight:      Height:        CBC:  Recent Labs  Lab 04/04/20 1925 04/04/20 1925 04/04/20 1946 04/05/20 0334  WBC 7.2  --   --  8.9  NEUTROABS 3.6  --   --  5.2  HGB 12.4   < > 15.0 12.8  HCT 38.7   < > 44.0 40.1  MCV 73.6*  --   --  73.8*  PLT PLATELET CLUMPS NOTED ON SMEAR, UNABLE TO ESTIMATE  --   --  133*   < > = values in this interval not displayed.    Basic Metabolic Panel:  Recent Labs  Lab 04/04/20 1925 04/04/20 1925 04/04/20 1946 04/05/20 0334  NA 140   < > 141 140  K 3.8   < > 3.8 3.5  CL 105   < > 101 102  CO2 25  --   --  26  GLUCOSE 117*   < > 115* 124*  BUN 6*   < > 9 6*  CREATININE 0.87   < > 0.80 0.85  CALCIUM 9.1  --   --  9.3  MG 1.7  --   --  2.1  PHOS 2.5  --   --   --    < > = values in this interval not displayed.    Lipid Panel:      Component Value Date/Time   CHOL 163 04/05/2020 0334   TRIG 64 04/05/2020 0334   HDL 63 04/05/2020 0334   CHOLHDL 2.6 04/05/2020 0334   VLDL 13 04/05/2020 0334   LDLCALC 87 04/05/2020 0334   HgbA1c:  Lab Results  Component Value Date   HGBA1C 6.2 (H) 04/04/2020   Urine Drug Screen:     Component Value Date/Time   LABOPIA NONE DETECTED 04/04/2020 2315   COCAINSCRNUR NONE DETECTED 04/04/2020 2315   LABBENZ NONE DETECTED 04/04/2020 2315   AMPHETMU NONE DETECTED 04/04/2020 2315   THCU NONE DETECTED 04/04/2020 2315   LABBARB NONE DETECTED 04/04/2020 2315    Alcohol Level No results found for: ETH  IMAGING  CT ANGIO HEAD W OR WO CONTRAST CT ANGIO NECK W OR WO CONTRAST 04/05/2020 IMPRESSION:  1. 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  2. Otherwise negative CTA of the head and neck. No other large vessel occlusion, hemodynamically significant stenosis, or other acute vascular abnormality.  3. 3 mm subpleural left upper lobe nodule, indeterminate. No follow-up needed if patient is low-risk. Non-contrast chest CT can be considered in 12 months if patient is high-risk. This recommendation follows the consensus statement: Guidelines for Management of Incidental Pulmonary Nodules Detected on CT Images: From the Fleischner Society 2017; Radiology 2017; 284:228-243.  CT HEAD WO CONTRAST 04/04/2020 IMPRESSION:  1. Loss of gray-white differentiation geographic region of ill-defined hypoattenuation in the right temporal lobe concerning for age-indeterminate infarct, possibly late acute to subacute.  2. No other acute intracranial abnormality.  3. Chronic microvascular angiopathy and parenchymal volume loss.  4. Extensive periapical lucencies of the maxillary dentition including a left maxillary molar possibly suggesting an odontogenic origin of left maxillary sinus disease. Correlate with dental exam.   DG  Chest Port 1 View 04/04/2020 IMPRESSION:  No active disease.   ECHOCARDIOGRAM COMPLETE BUBBLE STUDY 04/05/2020 IMPRESSIONS   1. Left ventricular ejection fraction, by estimation, is 55 to 60%. The left ventricle has normal function. The left ventricle has no regional wall motion abnormalities. There is mild left ventricular hypertrophy. Left ventricular diastolic parameters are indeterminate.   2. Right ventricular systolic function is normal. The right ventricular size is normal.   3. Left atrial size was moderately dilated.   4. Nosignificant valvular abnormality.   5. Agitated saline contrast  bubble study was negative, with no evidence of any interatrial shunt.   ECG - SB rate 35 BPM. (See cardiology reading for complete details)  EEG - pending   PHYSICAL EXAM Blood pressure (!) 183/73, pulse (!) 41, temperature 98.6 F (37 C), temperature source Oral, resp. rate 20, height 5\' 8"  (1.727 m), weight 106.6 kg, SpO2 90 %. Pleasant elderly obese African-American lady not in distress. . Afebrile. Head is nontraumatic. Neck is supple without bruit.    Cardiac exam no murmur or gallop. Lungs are clear to auscultation. Distal pulses are well felt. Neurological Exam ;  Awake  Alert oriented x 3. Normal speech and language.eye movements full without nystagmus.fundi were not visualized. Vision acuity   appears normal.  But has dense left homonymous hemianopsia.  Hearing is normal. Palatal movements are normal.  Mild left lower facial asymmetry.. Tongue midline. Normal strength, tone, reflexes and coordination. Normal sensation. Gait deferred.  NIH stroke scale 3.  Premorbid modified Rankin 0    ASSESSMENT/PLAN [name] is a 74 y.o. female with history of DM2, HTN, atrial tachycardia, hx of bradycardia with BB's, obesity and recent stress over the loss of a close friend who presented to the ED via EMS on Saturday evening for evaluation of AMS, HA, abnormal sounding speech,  possible brief LOC, and transient bradycardia. She did not receive IV t-PA due to late presentation (>4.5 hours from time of onset).  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  Resultant intermittent confusion and left-sided peripheral vision loss  Code Stroke CT Head - not ordered  CT head - Loss of gray-white differentiation geographic region of ill-defined hypoattenuation in the right temporal lobe concerning for age-indeterminate infarct, possibly late acute to subacute. Chronic microvascular angiopathy and parenchymal volume loss.   MRI head - pending  MRA head - not ordered  CTA H&N - Acute proximal right M2 branch occlusion, corresponding with previously identified evolving right MCA territory infarct.   CT Perfusion - not ordered  Carotid Doppler - CTA neck performed - carotid dopplers not indicated.  2D Echo - EF 55 to 60%. No cardiac source of emboli identified.   Sars Corona Virus 2 - negative  LDL - 87  HgbA1c - 6.2  UDS - negative  VTE prophylaxis - Lovenox Diet  Diet Order            Diet Carb Modified Fluid consistency: Thin; Room service appropriate? Yes  Diet effective now                 aspirin 81 mg daily prior to admission, now on aspirin 300 mg suppository daily  Patient counseled to be compliant with her antithrombotic medications  Ongoing aggressive stroke risk factor management  Therapy recommendations:  pending  Disposition:  Pending  Hypertension  Home BP meds: Tenormin , Catapres, Valsartan, HCTZ  Current BP meds: HCTZ, Irbasartan, Clonidine  Stable . Permissive hypertension (OK if < 220/120) but gradually normalize in 5-7 days  . Long-term BP goal normotensive  Hyperlipidemia  Home Lipid lowering medication: none   LDL 87, goal < 70  Current lipid lowering medication: Lipitor 80 mg daily   Continue statin at discharge  Diabetes  Home diabetic meds: none    Current diabetic meds: SSI   HgbA1c 6.2, goal < 7.0 Recent Labs    04/04/20 1909 04/05/20 0840  GLUCAP 122* 125*    Other Stroke Risk Factors  Advanced age  Obesity,  Body mass index is 35.73 kg/m., recommend weight loss, diet and exercise as appropriate   Family hx stroke (mother)   Other Active Problems  Code status - Full code  Bradycardia with brief LOC - beta blocker held (hx of atrial tachycardia)  3 mm subpleural left upper lobe nodule, indeterminate. No follow-up needed if patient is low-risk. Non-contrast chest CT can be considered in 12 months if patient is high-risk.   Extensive periapical lucencies of the maxillary dentition including a left maxillary molar possibly suggesting an odontogenic origin of left maxillary sinus disease. Correlate with dental exam.    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  Recommend aspirin and Plavix for stroke prevention and aggressive risk factor modification.  Patient may benefit with consideration for participation in the [employer] stroke prevention study.  She was given written information to review and decide.  Long discussion with patient and son and answered questions.  Greater than 50% time during this 35-minute visit was spent in counseling and coordination of care about embolic stroke and discussion with stroke prevention treatment and answering questions. Delia Heady, MD To contact Stroke Continuity provider, please refer to [email or web address]. After hours, contact General Neurology

## 2020-04-05 NOTE — Progress Notes (Signed)
EEG complete - results pending 

## 2020-04-05 NOTE — Progress Notes (Signed)
MD made aware of pt's high BPs per MD will allow permissive HTN.  Will continue to monitor pt. Pt. Stable and asymptomatic.

## 2020-04-05 NOTE — Progress Notes (Signed)
  Echocardiogram 2D Echocardiogram with bubble study and definity has been performed.  Leta Jungling M 04/05/2020, 8:24 AM

## 2020-04-05 NOTE — ED Notes (Signed)
Report given to this writer by Dierdre Harness that Pt will get MRI after ADM to floor

## 2020-04-05 NOTE — ED Notes (Signed)
RN paged MD Shalhoub to let him know pt is claustrophobic  and that medications would be needed whenever pt attended MRI. MD Shalhoub placed orders but was also informed the ED is short staffed at this time, and no one Able to go with the pt to MRI at this time due to her needing to have a nurse throughout. MD Shalhoub stated that the MRI is not stat and may be put off until there is staff available or ED slows down.  RN will continue to monitor

## 2020-04-05 NOTE — Progress Notes (Addendum)
Subjective:  Says she feels well this morning.  Still says, it's "2001".  Son Iantha Fallen at bedside.   Objective:  Vital Signs in the last 24 hours: Temp:  [98.6 F (37 C)-99 F (37.2 C)] 99 F (37.2 C) (07/18 1258) Pulse Rate:  [25-135] 100 (07/18 1245) Resp:  [15-26] 21 (07/18 1302) BP: (144-187)/(66-124) 144/72 (07/18 1302) SpO2:  [88 %-100 %] 88 % (07/18 1245) Weight:  [106.6 kg] 106.6 kg (07/17 1914)  Intake/Output from previous day: 07/17 0701 - 07/18 0700 In: -  Out: 500 [Urine:500]  Physical Exam Vitals and nursing note reviewed.  Constitutional:      General: She is not in acute distress.    Appearance: She is well-developed.  HENT:     Head: Normocephalic and atraumatic.  Eyes:     Conjunctiva/sclera: Conjunctivae normal.     Pupils: Pupils are equal, round, and reactive to light.  Neck:     Vascular: No JVD.  Cardiovascular:     Rate and Rhythm: Regular rhythm. Bradycardia present.     Pulses: Normal pulses and intact distal pulses.     Heart sounds: No murmur heard.   Pulmonary:     Effort: Pulmonary effort is normal.     Breath sounds: Normal breath sounds. No wheezing or rales.  Abdominal:     General: Bowel sounds are normal.     Palpations: Abdomen is soft.     Tenderness: There is no rebound.  Musculoskeletal:        General: No tenderness. Normal range of motion.     Left lower leg: No edema.  Lymphadenopathy:     Cervical: No cervical adenopathy.  Skin:    General: Skin is warm and dry.  Neurological:     Mental Status: She is alert.     Cranial Nerves: No cranial nerve deficit.     Comments: Oriented to self, place. Knows its July, says its 2001.       Lab Results: BMP Recent Labs    04/04/20 1925 04/04/20 1946 04/05/20 0334  NA 140 141 140  K 3.8 3.8 3.5  CL 105 101 102  CO2 25  --  26  GLUCOSE 117* 115* 124*  BUN 6* 9 6*  CREATININE 0.87 0.80 0.85  CALCIUM 9.1  --  9.3  GFRNONAA >60  --  >60  GFRAA >60  --  >60     CBC Recent Labs  Lab 04/05/20 0334  WBC 8.9  RBC 5.43*  HGB 12.8  HCT 40.1  PLT 133*  MCV 73.8*  MCH 23.6*  MCHC 31.9  RDW 16.0*  LYMPHSABS 2.9  MONOABS 0.7  EOSABS 0.0  BASOSABS 0.0    HEMOGLOBIN A1C Lab Results  Component Value Date   HGBA1C 6.2 (H) 04/04/2020   MPG 131.24 04/04/2020    Cardiac Panel (last 3 results) No results for input(s): CKTOTAL, CKMB, TROPONINI, RELINDX in the last 8760 hours.  BNP (last 3 results) Recent Labs    04/04/20 1925  BNP 738.8*    TSH Recent Labs    04/04/20 1925  TSH 2.452    Lipid Panel     Component Value Date/Time   CHOL 163 04/05/2020 0334   TRIG 64 04/05/2020 0334   HDL 63 04/05/2020 0334   CHOLHDL 2.6 04/05/2020 0334   VLDL 13 04/05/2020 0334   LDLCALC 87 04/05/2020 0334     Hepatic Function Panel Recent Labs    04/04/20 1925 04/05/20 0334  PROT 6.8  7.0  ALBUMIN 3.4* 3.5  AST 40 64*  ALT 30 43  ALKPHOS 56 61  BILITOT 0.8 0.7    Cardiac Studies:  Serial EKGs showing sinus rhythm sinus bradycardia, rate 30-60 bpm, with frequent PACs. Inferolateral T wave inversions, not new.  Telemetry reviewed.  No high-grade AV block, sinus pauses, asystole.  Compared with previous EKGs dating back to as early as 2015.   Echocardiogram 04/05/2020: 1. Left ventricular ejection fraction, by estimation, is 55 to 60%. The  left ventricle has normal function. The left ventricle has no regional  wall motion abnormalities. There is mild left ventricular hypertrophy.  Left ventricular diastolic parameters  are indeterminate.  2. Right ventricular systolic function is normal. The right ventricular  size is normal.  3. Left atrial size was moderately dilated.  4. Nosignificant valvular abnormality.  5. Agitated saline contrast bubble study was negative, with no evidence  of any interatrial shunt.   Lexiscan Myoview nuclear stress test 03/02/2015: 1. The resting electrocardiogram demonstrated normal  sinus rhythm, IVCD, LVH, no resting arrhythmias and nonspecific ST-T changes. Stress EKG is non-diagnostic for ischemia as it a pharmacologic stress using Lexiscan. Occasional PVC and atrial couplets and PAC. 2. Myocardial perfusion imaging is normal. Overall left ventricular systolic function was normal without regional wall motion abnormalities. The left ventricular ejection fraction was 54%.   Assessment & Recommendations:  74 y.o. African American female  with hypertension, type 2 DM, obesity, h/o atrial tachycardia, now with episodic bradycardia, age indeterminate Rt temporal infarct.   Encephalopathy: Presentations is explained by age indeterminate Rt temporal infarct.  Management as per Neurology recommendations. Bubble study negative. Unchanged moderate LA dilatation on echocardiogram also seen in 2019 (outpatient study). While this raises possibility of paroxysmal Afib, no Afib noted on telemetry. Also, she does have a corresponding acute prox Rt M2 branch occlusion.   Bradycardia: Reviewed serial EKGs, telemetry and compared with old EKGs.  She does have sinus bradycardia with intermittent PACs.  She has had similar changes dating back to as early as 2015, albeit rate is slightly slower this admission. However, her presentation with confusion without any hemodynamic instability, presyncope or syncope is not explained entirely by her bradycardia. Continue to hold atenolol.   Troponin elevation: Mildly elevated, flat. Not ACS.  No ischemic workup necessary at this time.   Cardiology signing off. Please call back with any questions.   Elder Negus, M.D. [facility] Pager: [phone] Office: [phone]

## 2020-04-05 NOTE — Progress Notes (Signed)
PROGRESS NOTE    Aubria Vanecek  RUE:[id number] DOB: [birth year] DOA: 04/04/2020 PCP: Merri Brunette, MD   Brief Narrative:  HPI per Dr. Shauna Hugh on 04/04/20  74 year old female with past medical history of diabetes mellitus type 2, obesity, history of atrial tachycardia, hypertension who presents to [facility] emergency department with family due to concerns for confusion.  Patient is an extremely poor historian and therefore a full history could not be entirely obtained. History has been obtained from a combination of both the patient and the son who is at the bedside. Son explains that for approximately past 24 hours patient and been exhibiting confusion and lethargy with associated poor appetite.  Upon questioning the patient, she denies shortness of breath, cough, fever, sick contacts, lightheadedness, abdominal pain, dysuria. Patient denies any new medications or herbal supplements. Patient denies any recent travel, sick contacts or confirmed contact with known COVID-19 infection.  Upon being evaluated in the emergency department, patient was incidentally found to be exhibiting severe bradycardia with heart rates as low as the 30s. The emergency department provider called Dr. Rosemary Holms with cardiology who graciously evaluated the patient at the bedside. It was recommended that the patient's atenolol be held with close monitoring on telemetry. He did not necessarily feel that the patient's lethargy and confusion was entirely secondary to the bradycardia and recommended a separate encephalopathy work-up. The hospitalist group was then called to assess patient for mission the hospital.  **Interim History Likely her symptoms are attributable to CVA and she is undergoing further work-up.  Neurology and cardiology have been both consulted.  Neurology started her on aspirin 325 and recommending permissive hypertension.  Cardiology is also consulted encephalopathy is  explained by an age-indeterminate right temporal infarct and her bradycardia was reviewed in she has had this back in 2015.  Dr. Rosemary Holms recommends continue to hold atenolol and feels that she has no further ischemic work-up needed at this time  Assessment & Plan:   Principal Problem:   Acute metabolic encephalopathy Active Problems:   Essential hypertension   Controlled diabetes mellitus type II without complication (HCC)   Bradycardia   Elevated troponin level not due myocardial infarction   Cerebral embolism with cerebral infarction  Acute metabolic encephalopathy -Patient is exhibiting lethargy, confusion and disorientation according to history and on my examination. -Because of this encephalopathy is not entirely clear at this time. Cardiology does not feel that this degree of bradycardia is causing patient's changes in mentation. -In addition to withholding beta-blocker and reducing carvedilol dosing, we are performing a comprehensive encephalopathy work-up including obtaining vitamin B12, folate, TSH, ammonia, RPR, urine toxicology screen, urinalysis and CT imaging of the head. -Hydrating patient gently with intravenous isotonic fluids -Monitoring for symptomatic improvement -No clinical evidence of meningitis -CT Head done and showed "Loss of gray-white differentiation geographic region of ill-defined hypoattenuation in the right temporal lobe concerning for age-indeterminate infarct, possibly late acute to subacute.  No other acute intracranial abnormality. Chronic microvascular angiopathy and parenchymal volume loss.Marland Kitchen Extensive periapical lucencies of the maxillary dentition including a left maxillary molar possibly suggesting an odontogenic origin of left maxillary sinus disease. Correlate with dental exam." -Neurology consulted and recommended CVA workup -CTA Head and Neck done and showed "Acute proximal right M2 branch occlusion, corresponding with previously identified  evolving right MCA territory infarct. Otherwise negative CTA of the head and neck. No other large vessel occlusion, hemodynamically significant stenosis, or other acute vascular abnormality. 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  No other acute intracranial abnormality. Chronic microvascular angiopathy and parenchymal volume loss.Marland Kitchen Extensive periapical lucencies of the maxillary dentition including a left maxillary molar possibly suggesting an odontogenic origin of left maxillary sinus disease. Correlate with dental exam." -Neurology consulted and recommended CVA workup -CTA Head and Neck done and showed "Acute proximal right M2 branch occlusion, corresponding with previously identified evolving right MCA territory infarct. Otherwise negative CTA of the head and neck. No other large vessel occlusion, hemodynamically significant stenosis, or other acute vascular abnormality. 3 mm subpleural left upper lobe nodule, indeterminate." -MRI of the brain still pending -Echocardiogram done with bubble study showed an EF of 55 to 60% with normal left ventricular systolic function with mild left ventricular hypertrophy and left ventricular diastolic parameters being indeterminant. There is no evidence of i interarterial shunt -EEG done and showed "This study is suggestive of cortical dysfunction in right temporal region which could be secondary to underlying structural abnormality. No seizures or epileptiform discharges were seen throughout the recording." -Carotid Dopplers, CT perfusion and MRA head were not done -Lipid panel done and hemoglobin A1c done -Lipid panel showed an LDL of 87 and hemoglobin A1c was 6.2 -Continue PT OT and SLP  evaluations; she passed her bedside swallow screen so she was placed on a diabetic carb modified diet -Continue aspirin 325 mg p.o. daily/rectally will need to discuss with neurology given if she needs Plavix or not as is written recommending aspirin Plavix for stroke prevention and aggressive risk factor modification was only written for aspirin 325 -Dr. Pearlean Brownie feels that she may be considered for the [employer]MS [employer] Stroke Prevention Study  -She is also on atorvastatin 81 p.o. daily  Sinus Bradycardia -Patient's home regimen of atenolol has been discontinued -Reducing home regimen of clonidine from 0.2 mg twice daily to 0.1 mg twice daily. I do not want to completely discontinue this due to concern for rebound hypertension. -Cardiology (Dr. Rosemary Holms) evaluated patients and recommendations are appreciated. They do not feel that this degree of bradycardia is causing patient's confusion. -We will continue to monitor patient on telemetry, cardiology feels that medical telemetry unit is an adequate acuity for this patient. -Cardiology recommends to continue to hold atenolol  Essential Hypertension -As mentioned above, discontinuing atenolol, reducing home regimen of clonidine 0.1 mg twice daily -Continuing home regimen of valsartan with irbesartan substitution of 300 g p.o. daily and hydrochlorothiazide 12.5 mg p.o. daily an -Providing patient with as needed intravenous antihypertensives for excessively elevated blood pressures 10 mg every 6 as needed for systolic blood pressure in 180 or diastolic blood pressure than 110 -For now neurology recommends continuing permissive hypertension and normalizing in 5-7 Days  Controlled diabetes mellitus type II without complication (HCC) -C.w Accu-Cheks before every meal and nightly with sliding scale insulin -Hemoglobin A1c 6.2 -CBGs ranging from 107-130   Mildly Elevated troponin level not due myocardial infarction -Cardiology feels that patient is  unlikely to be suffering from plaque rupture/ACS -Patient is chest pain-free -Cycling cardiac enzymes per cardiology recommendations -Continue Monitoring patient on telemetry -Continue ASA 325 mg po Daily  -Cardiology feels no further ischemic work-up is needed  Lung Nodule -Seen on CTA -3 mm subpleural left upper lobe nodule, indeterminate. -Follow up as an outpatient with Non-contrast chest CT 12 months if  patient is high-risk  Thrombocytopenia -The patient platelet count is 133 -Continue to monitor for signs and symptoms of bleeding; currently no overt bleeding noted -Repeat CBC in a.m.  Left Maxillary Sinus Disease -The setting of her poor dentition -She will need a dentist evaluation in the outpatient setting when she is over her Acute CVA  Obesity -Estimated body mass index is 35.73 kg/m as calculated from the following:   Height as of this encounter:  (1.727 m).   Weight as of this encounter: 106.6 kg. -Continued Dietary And Weight Loss counseling   DVT prophylaxis: Enoxaparin 40 mg sq q24h Code Status: FULL CODE Family Communication: Discussed with Son at Bedside  Disposition Plan: Need further neurological work-up and improvement and clearance by cardiology and neurology  Status is: Inpatient  Remains inpatient appropriate because:Ongoing diagnostic testing needed not appropriate for outpatient work up, Unsafe d/c plan and Inpatient level of care appropriate due to severity of illness   Dispo: The patient is from: Home              Anticipated d/c is to: SNF              Anticipated d/c date is: 2 days              Patient currently is not medically stable to d/c.  Consultants:   Neurology  Cardiology   Procedures:  EEG This study is suggestive of cortical dysfunction in right temporal region which could be secondary to underlying structural abnormality. No seizures or epileptiform discharges were seen throughout the recording.  ECHOCARDIOGRAM with  Bubble IMPRESSIONS    1. Left ventricular ejection fraction, by estimation, is 55 to 60%. The  left ventricle has normal function. The left ventricle has no regional  wall motion abnormalities. There is mild left ventricular hypertrophy.  Left ventricular diastolic parameters  are indeterminate.  2. Right ventricular systolic function is normal. The right ventricular  size is normal.  3. Left atrial size was moderately dilated.  4. Nosignificant valvular abnormality.  5. Agitated saline contrast bubble study was negative, with no evidence  of any interatrial shunt.   FINDINGS  Left Ventricle: Left ventricular ejection fraction, by estimation, is 55  to 60%. The left ventricle has normal function. The left ventricle has no  regional wall motion abnormalities. Definity contrast agent was given IV  to delineate the left ventricular  endocardial borders. The left ventricular internal cavity size was normal  in size. There is mild left ventricular hypertrophy. Left ventricular  diastolic parameters are indeterminate.   Right Ventricle: The right ventricular size is normal. No increase in  right ventricular wall thickness. Right ventricular systolic function is  normal.   Left Atrium: Left atrial size was moderately dilated.   Right Atrium: Right atrial size was normal in size.   Pericardium: There is no evidence of pericardial effusion.   Mitral Valve: The mitral valve is grossly normal. No evidence of mitral  valve regurgitation.   Tricuspid Valve: The tricuspid valve is grossly normal. Tricuspid valve  regurgitation is not demonstrated.   Aortic Valve: The aortic valve is tricuspid. Aortic valve regurgitation is  not visualized.   Pulmonic Valve: The pulmonic valve was normal in structure. Pulmonic valve  regurgitation is not visualized.   Aorta: The aortic root is normal in size and structure.   IAS/Shunts: No atrial level shunt detected by color flow Doppler.  Agitated  saline contrast was given intravenously to evaluate for intracardiac  shunting. Agitated saline contrast bubble study was negative, with no  evidence of any interatrial shunt.     LEFT VENTRICLE  PLAX 2D  LVIDd:     5.00 cm  LVIDs:     4.10 cm  LV PW:     1.10 cm  LV IVS:    1.40 cm  LVOT diam:   2.20 cm  LV SV:     96  LV SV Index:  44  LVOT Area:   3.80 cm     LEFT ATRIUM      Index  LA Vol (A4C): 105.0 ml 47.97 ml/m  AORTIC VALVE  LVOT Vmax:  105.00 cm/s  LVOT Vmean: 63.200 cm/s  LVOT VTI:  0.252 m    AORTA  Ao Root diam: 3.70 cm  Ao Asc diam: 4.30 cm   MITRAL VALVE  MV Area (PHT): 4.85 cm  SHUNTS  MV Decel Time: 156 msec  Systemic VTI: 0.25 m  MV E velocity: 88.97 cm/s Systemic Diam: 2.20 cm    Antimicrobials: Anti-infectives (From admission, onward)   None     Subjective: And examined at bedside she is back to get an EEG. No nausea or vomiting but was a little confused. Son states that symptoms started a little while ago. But she is extremely confused. No other concerns or points at this time.  Objective: Vitals:   04/05/20 1245 04/05/20 1258 04/05/20 1302 04/05/20 1558  BP: (!) 155/70  (!) 144/72 (!) 160/80  Pulse: 100   95  Resp: (!) 22  (!) 21 19  Temp:  99 F (37.2 C)  98.3 F (36.8 C)  TempSrc:  Oral  Oral  SpO2: (!) 88%   99%  Weight:      Height:        Intake/Output Summary (Last 24 hours) at 04/05/2020 1852 Last data filed at 04/05/2020 1601 Gross per 24 hour  Intake 36.48 ml  Output 1500 ml  Net -1463.52 ml   Filed Weights   04/04/20 1914  Weight: 106.6 kg   Examination: Physical Exam:  Constitutional: WN/WD obese African-American female currently in NAD and appears calm and comfortable Eyes: Lids and conjunctivae normal, sclerae anicteric  ENMT: External Ears, Nose appear normal. Grossly normal hearing. Extremely poor dentition Neck: Appears normal, supple, no cervical  masses, normal ROM, no appreciable thyromegaly: No JVD Respiratory: Diminished to auscultation bilaterally, no wheezing, rales, rhonchi or crackles. Normal respiratory effort and patient is not tachypenic. No accessory muscle use.  Cardiovascular: RRR, no murmurs / rubs / gallops. S1 and S2 auscultated.  Abdomen: Soft, non-tender, distended secondary body habitus.  Bowel sounds positive.  GU: Deferred. Musculoskeletal: No clubbing / cyanosis of digits/nails. No joint deformity upper and lower extremities.  Skin: No rashes, lesions, ulcers on limited skin evaluation. No induration; Warm and dry.  Neurologic: CN 2-12 grossly intact with no focal deficits.  Romberg sign cerebellar reflexes not assessed.  Psychiatric: Impaired judgment and insight. She is awake but not fully alert and oriented x 3. Normal mood and appropriate affect.   Data Reviewed: I have personally reviewed following labs and imaging studies  CBC: Recent Labs  Lab 04/04/20 1925 04/04/20 1946 04/05/20 0334  WBC 7.2  --  8.9  NEUTROABS 3.6  --  5.2  HGB 12.4 15.0 12.8  HCT 38.7 44.0 40.1  MCV 73.6*  --  73.8*  PLT PLATELET CLUMPS NOTED ON SMEAR, UNABLE TO ESTIMATE  --  133*   Basic Metabolic Panel: Recent  Labs  Lab 04/04/20 1925 04/04/20 1946 04/05/20 0334  NA 140 141 140  K 3.8 3.8 3.5  CL 105 101 102  CO2 25  --  26  GLUCOSE 117* 115* 124*  BUN 6* 9 6*  CREATININE 0.87 0.80 0.85  CALCIUM 9.1  --  9.3  MG 1.7  --  2.1  PHOS 2.5  --   --    GFR: Estimated Creatinine Clearance: 75.4 mL/min (by C-G formula based on SCr of 0.85 mg/dL). Liver Function Tests: Recent Labs  Lab 04/04/20 1925 04/05/20 0334  AST 40 64*  ALT 30 43  ALKPHOS 56 61  BILITOT 0.8 0.7  PROT 6.8 7.0  ALBUMIN 3.4* 3.5   No results for input(s): LIPASE, AMYLASE in the last 168 hours. Recent Labs  Lab 04/04/20 2307  AMMONIA 23   Coagulation Profile: Recent Labs  Lab 04/05/20 0334  INR 1.1   Cardiac Enzymes: No results  for input(s): CKTOTAL, CKMB, CKMBINDEX, TROPONINI in the last 168 hours. BNP (last 3 results) No results for input(s): PROBNP in the last 8760 hours. HbA1C: Recent Labs    04/04/20 1925  HGBA1C 6.2*   CBG: Recent Labs  Lab 04/04/20 1909 04/05/20 0840 04/05/20 1301 04/05/20 1635  GLUCAP 122* 125* 107* 130*   Lipid Profile: Recent Labs    04/05/20 0334  CHOL 163  HDL 63  LDLCALC 87  TRIG 64  CHOLHDL 2.6   Thyroid Function Tests: Recent Labs    04/04/20 1925  TSH 2.452   Anemia Panel: Recent Labs    04/04/20 1925  VITAMINB12 344  FOLATE 13.5   Sepsis Labs: No results for input(s): PROCALCITON, LATICACIDVEN in the last 168 hours.  Recent Results (from the past 240 hour(s))  SARS Coronavirus 2 by RT PCR (hospital order, performed in [facility] hospital lab) Nasopharyngeal Nasopharyngeal Swab     Status: None   Collection Time: 04/04/20  7:25 PM   Specimen: Nasopharyngeal Swab  Result Value Ref Range Status   SARS Coronavirus 2 NEGATIVE NEGATIVE Final    Comment: (NOTE) SARS-CoV-2 target nucleic acids are NOT DETECTED.  The SARS-CoV-2 RNA is generally detectable in upper and lower respiratory specimens during the acute phase of infection. The lowest concentration of SARS-CoV-2 viral copies this assay can detect is 250 copies / mL. A negative result does not preclude SARS-CoV-2 infection and should not be used as the sole basis for treatment or other patient management decisions.  A negative result may occur with improper specimen collection / handling, submission of specimen other than nasopharyngeal swab, presence of viral mutation(s) within the areas targeted by this assay, and inadequate number of viral copies (<250 copies / mL). A negative result must be combined with clinical observations, patient history, and epidemiological information.  Fact Sheet for Patients:   BoilerBrush.com.cy  Fact Sheet for Healthcare  Providers: https://pope.com/  This test is not yet approved or  cleared by the [place] FDA and has been authorized for detection and/or diagnosis of SARS-CoV-2 by FDA under an Emergency Use Authorization (EUA).  This EUA will remain in effect (meaning this test can be used) for the duration of the COVID-19 declaration under Section 564(b)(1) of the Act, 21 U.S.C. section 360bbb-3(b)(1), unless the authorization is terminated or revoked sooner.  Performed at [facility], [address], [place], [place] [address]      RN Pressure Injury Documentation:     Estimated body mass index is 35.73 kg/m as calculated  from the following:   Height as of this encounter: 5\' 8"  (1.727 m).   Weight as of this encounter: 106.6 kg.  Malnutrition Type:      Malnutrition Characteristics:      Nutrition Interventions:     Radiology Studies: CT ANGIO HEAD W OR WO CONTRAST  Result Date: 04/05/2020 CLINICAL DATA:  Initial evaluation for acute neuro deficit, stroke suspected. EXAM: CT ANGIOGRAPHY HEAD AND NECK TECHNIQUE: Multidetector CT imaging of the head and neck was performed using the standard protocol during bolus administration of intravenous contrast. Multiplanar CT image reconstructions and MIPs were obtained to evaluate the vascular anatomy. Carotid stenosis measurements (when applicable) are obtained utilizing NASCET criteria, using the distal internal carotid diameter as the denominator. CONTRAST:  OMNIPAQUE IOHEXOL 350 MG/ML SOLN COMPARISON:  Prior head CT from earlier the same day. FINDINGS: CTA NECK FINDINGS Aortic arch: Visualized aortic arch normal caliber with normal 3 vessel morphology. Mild atheromatous change within the arch itself. No hemodynamically significant stenosis seen about the origin of the great vessels. Right carotid system: Right common carotid artery patent from its origin to the bifurcation without stenosis. Mild eccentric  calcified plaque at the right bifurcation without significant stenosis. Right ICA widely patent distally without stenosis, dissection or occlusion. Left carotid system: Left CCA patent from its origin to the bifurcation without stenosis. No significant atheromatous irregularity about the left bifurcation. Left ICA widely patent distally to the skull base without stenosis dissection or occlusion. Vertebral arteries: Both vertebral arteries arise from the subclavian arteries. No proximal subclavian artery stenosis. Vertebral arteries patent within the neck without stenosis, dissection or occlusion. Skeleton: No acute osseous abnormality. No discrete or worrisome osseous lesions. Other neck: No other acute soft tissue abnormality within the neck. No mass lesion or adenopathy. Upper chest: Visualized upper chest demonstrates no acute finding. 3 mm subpleural left upper lobe nodule noted (series 5, image 184), indeterminate. Review of the MIP images confirms the above findings CTA HEAD FINDINGS Anterior circulation: Both internal carotid arteries patent to the termini without stenosis or other abnormality. A1 segments widely patent. Normal anterior communicating artery complex. Anterior cerebral arteries widely patent to their distal aspects. Left M1 segment widely patent. Normal left MCA bifurcation. Distal left MCA branches well perfused. Right M1 widely patent. Normal right MCA bifurcation. There is acute occlusion of a proximal right M2 branch (series 7, image 99). Remainder of the right MCA branches remain perfused. Posterior circulation: Vertebral arteries patent to the vertebrobasilar junction without stenosis. Both picas patent. Basilar widely patent to its distal aspect without stenosis. Superior cerebral arteries patent bilaterally. Left PCA supplied via the basilar as well as a robust left posterior communicating artery. Fetal type origin of the right PCA. Both PCAs well perfused to their distal aspects  without stenosis. Venous sinuses: Patent allowing for timing the contrast bolus. Anatomic variants: Fetal type origin of the right PCA. No intracranial aneurysm. Review of the MIP images confirms the above findings IMPRESSION: 1. 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 2. Otherwise negative CTA of the head and neck. No other large vessel occlusion, hemodynamically significant stenosis, or other acute vascular abnormality. 3. 3 mm subpleural left upper lobe nodule, indeterminate. No follow-up needed if patient is low-risk. Non-contrast chest CT can be considered in 12 months if patient is high-risk. This recommendation follows the consensus statement: Guidelines for Management of Incidental Pulmonary Nodules Detected on CT Images: From the Fleischner Society 2017;  Radiology 2017; [id number]. Electronically Signed   By: Rise MuBenjamin  McClintock M.D.   On: 04/05/2020 01:14   CT HEAD WO CONTRAST  Result Date: 04/04/2020 CLINICAL DATA:  Mental status change EXAM: CT HEAD WITHOUT CONTRAST TECHNIQUE: Contiguous axial images were obtained from the base of the skull through the vertex without intravenous contrast. COMPARISON:  MRI 06/10/2009, paranasal CT 10/13/2009 FINDINGS: Brain: Loss of gray-white differentiation and ill-defined hypoattenuation in the right temporal lobe concerning for an age-indeterminate infarct not present on comparison imaging from 20/10, 2011. No other CT evident areas of acute vascular territory or cortically based infarct. No hemorrhage, hydrocephalus, extra-axial collection or mass lesion/mass effect. Symmetric prominence of the ventricles, cisterns and sulci compatible with parenchymal volume loss. Additional patchy areas of white matter hypoattenuation are most compatible with chronic microvascular angiopathy. Vascular: Atherosclerotic calcification of the carotid siphons. No hyperdense vessel. Skull: No calvarial  fracture or suspicious osseous lesion. No scalp swelling or hematoma. Sinuses/Orbits: Few pneumatized secretions noted in the left maxillary sinus. Extensive periapical lucencies of the maxillary dentition including a left maxillary molar possibly suggesting an odontogenic origin of the paranasal sinus disease. Included orbital structures are unremarkable. Other: None. IMPRESSION: 1. Loss of gray-white differentiation geographic region of ill-defined hypoattenuation in the right temporal lobe concerning for age-indeterminate infarct, possibly late acute to subacute. 2. No other acute intracranial abnormality. 3. Chronic microvascular angiopathy and parenchymal volume loss. 4. Extensive periapical lucencies of the maxillary dentition including a left maxillary molar possibly suggesting an odontogenic origin of left maxillary sinus disease. Correlate with dental exam. Critical Value/emergent results were called by telephone at the time of interpretation on 04/04/2020 at 11:10 pm to [facility] , who verbally acknowledged these results. Electronically Signed   By: Kreg ShropshirePrice  DeHay M.D.   On: 04/04/2020 23:07   CT ANGIO NECK W OR WO CONTRAST  Result Date: 04/05/2020 CLINICAL DATA:  Initial evaluation for acute neuro deficit, stroke suspected. EXAM: CT ANGIOGRAPHY HEAD AND NECK TECHNIQUE: Multidetector CT imaging of the head and neck was performed using the standard protocol during bolus administration of intravenous contrast. Multiplanar CT image reconstructions and MIPs were obtained to evaluate the vascular anatomy. Carotid stenosis measurements (when applicable) are obtained utilizing NASCET criteria, using the distal internal carotid diameter as the denominator. CONTRAST:  100mL OMNIPAQUE IOHEXOL 350 MG/ML SOLN COMPARISON:  Prior head CT from earlier the same day. FINDINGS: CTA NECK FINDINGS Aortic arch: Visualized aortic arch normal caliber with normal 3 vessel morphology. Mild atheromatous change  within the arch itself. No hemodynamically significant stenosis seen about the origin of the great vessels. Right carotid system: Right common carotid artery patent from its origin to the bifurcation without stenosis. Mild eccentric calcified plaque at the right bifurcation without significant stenosis. Right ICA widely patent distally without stenosis, dissection or occlusion. Left carotid system: Left CCA patent from its origin to the bifurcation without stenosis. No significant atheromatous irregularity about the left bifurcation. Left ICA widely patent distally to the skull base without stenosis dissection or occlusion. Vertebral arteries: Both vertebral arteries arise from the subclavian arteries. No proximal subclavian artery stenosis. Vertebral arteries patent within the neck without stenosis, dissection or occlusion. Skeleton: No acute osseous abnormality. No discrete or worrisome osseous lesions. Other neck: No other acute soft tissue abnormality within the neck. No mass lesion or adenopathy. Upper chest: Visualized upper chest demonstrates no acute finding. 3 mm subpleural left upper lobe nodule noted (series 5, image 184), indeterminate. Review of the MIP images confirms  the above findings CTA HEAD FINDINGS Anterior circulation: Both internal carotid arteries patent to the termini without stenosis or other abnormality. A1 segments widely patent. Normal anterior communicating artery complex. Anterior cerebral arteries widely patent to their distal aspects. Left M1 segment widely patent. Normal left MCA bifurcation. Distal left MCA branches well perfused. Right M1 widely patent. Normal right MCA bifurcation. There is acute occlusion of a proximal right M2 branch (series 7, image 99). Remainder of the right MCA branches remain perfused. Posterior circulation: Vertebral arteries patent to the vertebrobasilar junction without stenosis. Both picas patent. Basilar widely patent to its distal aspect without  stenosis. Superior cerebral arteries patent bilaterally. Left PCA supplied via the basilar as well as a robust left posterior communicating artery. Fetal type origin of the right PCA. Both PCAs well perfused to their distal aspects without stenosis. Venous sinuses: Patent allowing for timing the contrast bolus. Anatomic variants: Fetal type origin of the right PCA. No intracranial aneurysm. Review of the MIP images confirms the above findings IMPRESSION: 1. 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 2. Otherwise negative CTA of the head and neck. No other large vessel occlusion, hemodynamically significant stenosis, or other acute vascular abnormality. 3. 3 mm subpleural left upper lobe nodule, indeterminate. No follow-up needed if patient is low-risk. Non-contrast chest CT can be considered in 12 months if patient is high-risk. This recommendation follows the consensus statement: Guidelines for Management of Incidental Pulmonary Nodules Detected on CT Images: From the Fleischner Society 2017; Radiology 2017; 284:228-243. Electronically Signed   By: Rise Mu M.D.   On: 04/05/2020 01:14   DG Chest Port 1 View  Result Date: 04/04/2020 CLINICAL DATA:  Shortness of breath EXAM: PORTABLE CHEST 1 VIEW COMPARISON:  October 12, 2014 FINDINGS: There is unchanged cardiomegaly. Both lungs are clear. The visualized skeletal structures are unremarkable. IMPRESSION: No active disease. Electronically Signed   By: Jonna Clark M.D.   On: 04/04/2020 19:44   EEG adult  Result Date: 04/05/2020 Charlsie Quest, MD     04/05/2020 12:42 PM Patient Name: Stamatia Masri MRN: [id number] Epilepsy Attending: Charlsie Quest Referring Physician/Provider: Dr Delia Heady Date: 04/05/2020 Duration: 25.04 mins Patient history: 74 y.o. female with history of DM2, HTN, atrial tachycardia, hx of bradycardia with BB's, obesity and recent stress  over the loss of a close friend who presented to the ED via EMS on Saturday evening for evaluation of AMS, HA, abnormal sounding speech, possible brief LOC, and transient bradycardia. EEG to evaluate for seizure. Level of alertness: Awake, asleep AEDs during EEG study: None Technical aspects: This EEG study was done with scalp electrodes positioned according to the 10-20 International system of electrode placement. Electrical activity was acquired at a sampling rate of  and reviewed with a high frequency filter of  and a low frequency filter of . EEG data were recorded continuously and digitally stored. Description: The posterior dominant rhythm consists of 9-10 Hz activity of moderate voltage (25-35 uV) seen predominantly in posterior head regions, symmetric and reactive to eye opening and eye closing. Sleep was characterized by vertex waves, sleep spindles (12 to 14 Hz), maximal frontocentral region.  EEG showed intermittent 3 to 6 Hz theta-delta slowing in right temporal region .Hyperventilation and photic stimulation were not performed.  ABNORMALITY - Intermittent slow, right temporal region IMPRESSION: This study is suggestive of cortical dysfunction in right temporal region which could be secondary to underlying structural abnormality. No  seizures or epileptiform discharges were seen throughout the recording. Charlsie Quest   ECHOCARDIOGRAM COMPLETE BUBBLE STUDY  Result Date: 04/05/2020    ECHOCARDIOGRAM REPORT   Patient Name:   TALEEN PROSSER [facility] Date of Exam: 04/05/2020 Medical Rec #:  [id number]            Height:       68.0 in Accession #:    [id number]           Weight:       235.0 lb Date of Birth:  01-Oct-1945            BSA:          2.189 m Patient Age:    73 years             BP:           160/115 mmHg Patient Gender: F                    HR:           38 bpm. Exam Location:  Inpatient Procedure: 2D Echo, Intracardiac Opacification Agent and Saline Contrast Bubble            Study  Indications:    Stroke 434.91 / I63.9  History:        Patient has no prior history of Echocardiogram examinations.                 Risk Factors:Diabetes and Hypertension. Acute metabolic                 encephalopathy, Sinus bradycardia.  Sonographer:    Leta Jungling RDCS Referring Phys: [id number] Deno Lunger [facility] IMPRESSIONS  1. Left ventricular ejection fraction, by estimation, is 55 to 60%. The left ventricle has normal function. The left ventricle has no regional wall motion abnormalities. There is mild left ventricular hypertrophy. Left ventricular diastolic parameters are indeterminate.  2. Right ventricular systolic function is normal. The right ventricular size is normal.  3. Left atrial size was moderately dilated.  4. Nosignificant valvular abnormality.  5. Agitated saline contrast bubble study was negative, with no evidence of any interatrial shunt. FINDINGS  Left Ventricle: Left ventricular ejection fraction, by estimation, is 55 to 60%. The left ventricle has normal function. The left ventricle has no regional wall motion abnormalities. Definity contrast agent was given IV to delineate the left ventricular  endocardial borders. The left ventricular internal cavity size was normal in size. There is mild left ventricular hypertrophy. Left ventricular diastolic parameters are indeterminate. Right Ventricle: The right ventricular size is normal. No increase in right ventricular wall thickness. Right ventricular systolic function is normal. Left Atrium: Left atrial size was moderately dilated. Right Atrium: Right atrial size was normal in size. Pericardium: There is no evidence of pericardial effusion. Mitral Valve: The mitral valve is grossly normal. No evidence of mitral valve regurgitation. Tricuspid Valve: The tricuspid valve is grossly normal. Tricuspid valve regurgitation is not demonstrated. Aortic Valve: The aortic valve is tricuspid. Aortic valve regurgitation is not visualized. Pulmonic Valve:  The pulmonic valve was normal in structure. Pulmonic valve regurgitation is not visualized. Aorta: The aortic root is normal in size and structure. IAS/Shunts: No atrial level shunt detected by color flow Doppler. Agitated saline contrast was given intravenously to evaluate for intracardiac shunting. Agitated saline contrast bubble study was negative, with no evidence of any interatrial shunt.  LEFT VENTRICLE PLAX 2D LVIDd:  5.00 cm LVIDs:         4.10 cm LV PW:         1.10 cm LV IVS:        1.40 cm LVOT diam:     2.20 cm LV SV:         96 LV SV Index:   44 LVOT Area:     3.80 cm  LEFT ATRIUM            Index LA Vol (A4C): 105.0 ml 47.97 ml/m  AORTIC VALVE LVOT Vmax:   105.00 cm/s LVOT Vmean:  63.200 cm/s LVOT VTI:    0.252 m  AORTA Ao Root diam: 3.70 cm Ao Asc diam:  4.30 cm MITRAL VALVE MV Area (PHT): 4.85 cm    SHUNTS MV Decel Time: 156 msec    Systemic VTI:  0.25 m MV E velocity: 88.97 cm/s  Systemic Diam: 2.20 cm Manish Patwardhan MD Electronically signed by Truett Mainland MD Signature Date/Time: 04/05/2020/8:57:20 AM    Final    Scheduled Meds: . aspirin  300 mg Rectal Daily   Or  . aspirin  325 mg Oral Daily  . atorvastatin  80 mg Oral q1800  . cloNIDine  0.1 mg Oral BID  . enoxaparin (LOVENOX) injection  40 mg Subcutaneous QHS  . hydrochlorothiazide  12.5 mg Oral Daily  . insulin aspart  0-15 Units Subcutaneous TID AC & HS  . irbesartan  300 mg Oral Daily   Continuous Infusions:   LOS: 0 days   Merlene Laughter, DO [facility] PAGER is on AMION  If 7PM-7AM, please contact night-coverage [email or web address]

## 2020-04-05 NOTE — ED Notes (Signed)
DR Marland Mcalpine in room and was informed MRI has not been done . This Clinical research associate was informed the MRI could be done by previous RN during report

## 2020-04-05 NOTE — Procedures (Addendum)
Patient Name: Ashley [name]  MRN: [id number]  Epilepsy Attending: Charlsie Quest  Referring Physician/Provider: Dr Delia Heady Date: 04/05/2020 Duration: 25.04 mins  Patient history: 74 y.o. female with history of DM2, HTN, atrial tachycardia, hx of bradycardia with BB's, obesity and recent stress over the loss of a close friend who presented to the ED via EMS on Saturday evening for evaluation of AMS, HA, abnormal sounding speech, possible brief LOC, and transient bradycardia. EEG to evaluate for seizure.  Level of alertness: Awake, asleep  AEDs during EEG study: None  Technical aspects: This EEG study was done with scalp electrodes positioned according to the 10-20 International system of electrode placement. Electrical activity was acquired at a sampling rate of 500Hz  and reviewed with a high frequency filter of 70Hz  and a low frequency filter of 1Hz . EEG data were recorded continuously and digitally stored.   Description: The posterior dominant rhythm consists of 9-10 Hz activity of moderate voltage (25-35 uV) seen predominantly in posterior head regions, symmetric and reactive to eye opening and eye closing. Sleep was characterized by vertex waves, sleep spindles (12 to 14 Hz), maximal frontocentral region.  EEG showed intermittent 3 to 6 Hz theta-delta slowing in right temporal region .Hyperventilation and photic stimulation were not performed.    ABNORMALITY - Intermittent slow, right temporal region  IMPRESSION: This study is suggestive of cortical dysfunction in right temporal region which could be secondary to underlying structural abnormality. No seizures or epileptiform discharges were seen throughout the recording.  Manroop Jakubowicz 

## 2020-04-06 LAB — COMPREHENSIVE METABOLIC PANEL
ALT: 38 U/L (ref 0–44)
AST: 53 U/L — ABNORMAL HIGH (ref 15–41)
Albumin: 3.4 g/dL — ABNORMAL LOW (ref 3.5–5.0)
Alkaline Phosphatase: 64 U/L (ref 38–126)
Anion gap: 11 (ref 5–15)
BUN: 11 mg/dL (ref 8–23)
CO2: 26 mmol/L (ref 22–32)
Calcium: 9.6 mg/dL (ref 8.9–10.3)
Chloride: 100 mmol/L (ref 98–111)
Creatinine, Ser: 1.03 mg/dL — ABNORMAL HIGH (ref 0.44–1.00)
GFR calc Af Amer: >60 mL/min (ref >60)
GFR calc non Af Amer: 54 mL/min — ABNORMAL LOW (ref >60)
Glucose, Bld: 156 mg/dL — ABNORMAL HIGH (ref 70–99)
Potassium: 3.2 mmol/L — ABNORMAL LOW (ref 3.5–5.1)
Sodium: 137 mmol/L (ref 135–145)
Total Bilirubin: 1.1 mg/dL (ref 0.3–1.2)
Total Protein: 7.1 g/dL (ref 6.5–8.1)

## 2020-04-06 LAB — CBC WITH DIFFERENTIAL/PLATELET
Abs Immature Granulocytes: 0.04 10*3/uL (ref 0.00–0.07)
Basophils Absolute: 0 10*3/uL (ref 0.0–0.1)
Basophils Relative: 0 %
Eosinophils Absolute: 0 10*3/uL (ref 0.0–0.5)
Eosinophils Relative: 0 %
HCT: 42.4 % (ref 36.0–46.0)
Hemoglobin: 13.3 g/dL (ref 12.0–15.0)
Immature Granulocytes: 0 %
Lymphocytes Relative: 30 %
Lymphs Abs: 2.7 10*3/uL (ref 0.7–4.0)
MCH: 22.9 pg — ABNORMAL LOW (ref 26.0–34.0)
MCHC: 31.4 g/dL (ref 30.0–36.0)
MCV: 72.9 fL — ABNORMAL LOW (ref 80.0–100.0)
Monocytes Absolute: 0.4 10*3/uL (ref 0.1–1.0)
Monocytes Relative: 5 %
Neutro Abs: 5.8 10*3/uL (ref 1.7–7.7)
Neutrophils Relative %: 65 %
Platelets: 138 10*3/uL — ABNORMAL LOW (ref 150–400)
RBC: 5.82 MIL/uL — ABNORMAL HIGH (ref 3.87–5.11)
RDW: 15.9 % — ABNORMAL HIGH (ref 11.5–15.5)
WBC: 9 10*3/uL (ref 4.0–10.5)
nRBC: 0 % (ref 0.0–0.2)

## 2020-04-06 LAB — URINE CULTURE: Culture: NO GROWTH

## 2020-04-06 LAB — PHOSPHORUS: Phosphorus: 2.9 mg/dL (ref 2.5–4.6)

## 2020-04-06 LAB — MAGNESIUM: Magnesium: 1.9 mg/dL (ref 1.7–2.4)

## 2020-04-06 LAB — GLUCOSE, CAPILLARY
Glucose-Capillary: 119 mg/dL — ABNORMAL HIGH (ref 70–99)
Glucose-Capillary: 112 mg/dL — ABNORMAL HIGH (ref 70–99)

## 2020-04-06 MED ORDER — ASPIRIN 81 MG PO TABS: 81.0000 mg | ORAL_TABLET | Freq: Every day | ORAL | 0 refills | Status: AC

## 2020-04-06 MED ORDER — CLOPIDOGREL BISULFATE 75 MG PO TABS
75.0000 mg | ORAL_TABLET | Freq: Every day | ORAL | Status: DC
  Administered 2020-04-06: 75 mg via ORAL
  Filled 2020-04-06: qty 1

## 2020-04-06 MED ORDER — CLOPIDOGREL BISULFATE 75 MG PO TABS: 75.0000 mg | ORAL_TABLET | Freq: Every day | ORAL | 0 refills | Status: DC

## 2020-04-06 MED ORDER — ATORVASTATIN CALCIUM 80 MG PO TABS: 80.0000 mg | ORAL_TABLET | Freq: Every day | ORAL | 0 refills | Status: DC

## 2020-04-06 MED ORDER — ONDANSETRON HCL 4 MG PO TABS: 4.0000 mg | ORAL_TABLET | Freq: Four times a day (QID) | ORAL | 0 refills | Status: DC | PRN

## 2020-04-06 MED ORDER — POLYETHYLENE GLYCOL 3350 17 G PO PACK: 17.0000 g | PACK | Freq: Every day | ORAL | 0 refills | Status: DC | PRN

## 2020-04-06 MED ORDER — ASPIRIN EC 81 MG PO TBEC
81.0000 mg | DELAYED_RELEASE_TABLET | Freq: Every day | ORAL | Status: DC
  Administered 2020-04-06: 81 mg via ORAL
  Filled 2020-04-06: qty 1

## 2020-04-06 NOTE — Evaluation (Signed)
Physical Therapy Evaluation Patient Details Name: Ashley [name] MRN: [id number] DOB: 12-02-1945 Today's Date: 04/06/2020   History of Present Illness  74yo female presenting to the ED secondary to confusion, lethargy, and reduced appetite. Bradycardic in the ED and was taken off of atenolol. 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 No tpa given, out of window. PMH obesity, HTN, DM  Clinical Impression   Patient received in bed, very pleasant and hyperverbose- curious if she is usually this verbose or if she was just excited to see therapies. See below for mobility/physical assist levels. Able to mobilize very well at a general S-min guard level but very safe and even this level of guarding not truly needed. Demonstrates mild weakness R LE as well as very mild motor planning issues with MMT but able to follow cues to over come. Very steady with transfers and gait. Left sitting up in recliner with all needs met, MD present and attending. Feel she will be able to return home once medically cleared for DC by MD, will benefit from follow up skilled PT services in the OP PT setting.     Follow Up Recommendations Outpatient PT;Other (comment) (neuro OP PT)    Equipment Recommendations  None recommended by PT    Recommendations for Other Services       Precautions / Restrictions Precautions Precautions: Fall;Other (comment) Precaution Comments: hyperverbose Restrictions Weight Bearing Restrictions: No      Mobility  Bed Mobility Overal bed mobility: Independent                Transfers Overall transfer level: Needs assistance   Transfers: Sit to/from Stand Sit to Stand: Supervision         General transfer comment: S for safety, no physical assist given  Ambulation/Gait Ambulation/Gait assistance: Min guard Gait Distance (Feet): 200 Feet Assistive device: None Gait Pattern/deviations: Step-through  pattern;WFL(Within Functional Limits);Trunk flexed Gait velocity: decreased   General Gait Details: gait pattern generally WNL, no significant gait or balance deficit noted  Stairs            Wheelchair Mobility    Modified Rankin (Stroke Patients Only)       Balance Overall balance assessment: Mild deficits observed, not formally tested                                           Pertinent Vitals/Pain Pain Assessment: No/denies pain    Home Living Family/patient expects to be discharged to:: Private residence Living Arrangements: Alone;Other relatives (son, grandchildren, nieces/nephews are always in and out) Available Help at Discharge: Family;Available PRN/intermittently Type of Home: House Home Access: Level entry     Home Layout: One level Home Equipment: Grab bars - tub/shower;Shower seat Additional Comments: has shower chair but does not use it    Prior Function Level of Independence: Independent               Hand Dominance        Extremity/Trunk Assessment   Upper Extremity Assessment Upper Extremity Assessment: Defer to OT evaluation    Lower Extremity Assessment Lower Extremity Assessment: RLE deficits/detail;LLE deficits/detail RLE Deficits / Details: ankle DF  4-/5, quad 4/5, hip flexor 3/5 RLE Sensation: WNL RLE Coordination: WNL LLE Deficits / Details: 4+/5 grossly LLE Sensation: WNL LLE Coordination: WNL    Cervical / Trunk  Assessment Cervical / Trunk Assessment: Normal  Communication   Communication: No difficulties  Cognition Arousal/Alertness: Awake/alert Behavior During Therapy: WFL for tasks assessed/performed Overall Cognitive Status: Within Functional Limits for tasks assessed                                 General Comments: hyperverbose but very friendly and pleasant, no signfiicant cognitive safety issues noted      General Comments      Exercises     Assessment/Plan    PT  Assessment Patient needs continued PT services  PT Problem List Decreased strength;Obesity;Decreased activity tolerance;Decreased safety awareness;Decreased balance;Decreased mobility;Decreased coordination       PT Treatment Interventions DME instruction;Balance training;Gait training;Stair training;Functional mobility training;Patient/family education;Therapeutic activities;Therapeutic exercise    PT Goals (Current goals can be found in the Care Plan section)  Acute Rehab PT Goals Patient Stated Goal: go home today if able PT Goal Formulation: With patient Time For Goal Achievement: 04/20/20 Potential to Achieve Goals: Good    Frequency Min 3X/week   Barriers to discharge        Co-evaluation PT/OT/SLP Co-Evaluation/Treatment: Yes Reason for Co-Treatment: For patient/therapist safety;To address functional/ADL transfers PT goals addressed during session: Mobility/safety with mobility;Balance;Strengthening/ROM         AM-PAC PT "6 Clicks" Mobility  Outcome Measure Help needed turning from your back to your side while in a flat bed without using bedrails?: None Help needed moving from lying on your back to sitting on the side of a flat bed without using bedrails?: None Help needed moving to and from a bed to a chair (including a wheelchair)?: None Help needed standing up from a chair using your arms (e.g., wheelchair or bedside chair)?: None Help needed to walk in hospital room?: A Little Help needed climbing 3-5 steps with a railing? : A Little 6 Click Score: 22    End of Session Equipment Utilized During Treatment: Gait belt Activity Tolerance: Patient tolerated treatment well Patient left: in chair;with call bell/phone within reach Nurse Communication: Mobility status PT Visit Diagnosis: Muscle weakness (generalized) (M62.81);Other symptoms and signs involving the nervous system (E74.600)    Time: 2984-7308 PT Time Calculation (min) (ACUTE ONLY): 24 min   Charges:    PT Evaluation $PT Eval Moderate Complexity: 1 Mod (co-eval)          Windell Norfolk, DPT, PN1   Supplemental Physical Therapist [place]    Pager [phone] Acute Rehab Office [phone]

## 2020-04-06 NOTE — Evaluation (Signed)
Occupational Therapy Evaluation Patient Details Name: Ashley [name] MRN: [id number] DOB: 1946-05-12 Today's Date: 04/06/2020    History of Present Illness 74yo female presenting to the ED secondary to confusion, lethargy, and reduced appetite. Bradycardic in the ED and was taken off of atenolol. 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 No tpa given, out of window. PMH obesity, HTN, DM   Clinical Impression   PTA, pt lives alone and reports Independence with all ADLs, IADLs, and mobility without AD. Pt reports her family checks in on her frequently and can assist as needed at home. Pt demonstrated ability to complete LB dressing, grooming standing at sink and short distance mobility with no physical assistance required. Distant supervision only provided for safety, but pt demonstrates no safety concerns. Pt R shoulder mildly weaker than L shoulder, but all other muscle groups of B UE symmetrical and WFL. No OT needs at acute level or on DC.     Follow Up Recommendations  No OT follow up    Equipment Recommendations  None recommended by OT    Recommendations for Other Services       Precautions / Restrictions Precautions Precautions: Fall;Other (comment) Precaution Comments: hyperverbose Restrictions Weight Bearing Restrictions: No      Mobility Bed Mobility Overal bed mobility: Independent                Transfers Overall transfer level: Needs assistance Equipment used: None Transfers: Sit to/from Stand;Stand Pivot Transfers Sit to Stand: Independent Stand pivot transfers: Supervision       General transfer comment: S for safety, no physical assist given    Balance Overall balance assessment: Mild deficits observed, not formally tested                                         ADL either performed or assessed with clinical judgement   ADL Overall ADL's : Independent;At baseline                                        General ADL Comments: Distant supervision provided initially for tasks with intermittent cues to attend to task, but pt demonstrated ability to perform LB dressing, grooming standing at sink and transfers without physical assistance.      Vision Baseline Vision/History: No visual deficits Vision Assessment?: No apparent visual deficits     Perception     Praxis      Pertinent Vitals/Pain Pain Assessment: No/denies pain     Hand Dominance Right   Extremity/Trunk Assessment Upper Extremity Assessment Upper Extremity Assessment: Overall WFL for tasks assessed (R shoulder 3+/5, L shoulder 4/5, symmetrical otherwise)   Lower Extremity Assessment Lower Extremity Assessment: RLE deficits/detail;LLE deficits/detail RLE Deficits / Details: ankle DF  4-/5, quad 4/5, hip flexor 3/5 RLE Sensation: WNL RLE Coordination: WNL LLE Deficits / Details: 4+/5 grossly LLE Sensation: WNL LLE Coordination: WNL   Cervical / Trunk Assessment Cervical / Trunk Assessment: Normal   Communication Communication Communication: No difficulties   Cognition Arousal/Alertness: Awake/alert Behavior During Therapy: WFL for tasks assessed/performed Overall Cognitive Status: Within Functional Limits for tasks assessed  General Comments: hyperverbose but very friendly and pleasant, no signfiicant cognitive safety issues noted   General Comments       Exercises     Shoulder Instructions      Home Living Family/patient expects to be discharged to:: Private residence Living Arrangements: Alone;Other relatives (family always in and out checking on pt) Available Help at Discharge: Family;Available PRN/intermittently Type of Home: House Home Access: Level entry     Home Layout: One level     Bathroom Shower/Tub: Producer, television/film/video: Handicapped height Bathroom Accessibility: Yes   Home  Equipment: Grab bars - tub/shower;Shower seat   Additional Comments: has shower chair but does not use it      Prior Functioning/Environment Level of Independence: Independent        Comments: Independent with ADLs, IADLs and mobility without AD        OT Problem List:        OT Treatment/Interventions:      OT Goals(Current goals can be found in the care plan section) Acute Rehab OT Goals Patient Stated Goal: go home today if able  OT Frequency:     Barriers to D/C:            Co-evaluation PT/OT/SLP Co-Evaluation/Treatment: Yes Reason for Co-Treatment: For patient/therapist safety;Complexity of the patient's impairments (multi-system involvement) PT goals addressed during session: Mobility/safety with mobility;Balance;Strengthening/ROM OT goals addressed during session: ADL's and self-care      AM-PAC OT "6 Clicks" Daily Activity     Outcome Measure Help from another person eating meals?: None Help from another person taking care of personal grooming?: None Help from another person toileting, which includes using toliet, bedpan, or urinal?: None Help from another person bathing (including washing, rinsing, drying)?: None Help from another person to put on and taking off regular upper body clothing?: None Help from another person to put on and taking off regular lower body clothing?: None 6 Click Score: 24   End of Session Equipment Utilized During Treatment: Gait belt Nurse Communication: Mobility status  Activity Tolerance: Patient tolerated treatment well Patient left: Other (comment) (with PT ambulating in hall)                   Time: 8657-8469 OT Time Calculation (min): 22 min Charges:  OT General Charges $OT Visit: 1 Visit OT Evaluation $OT Eval Low Complexity: 1 Low  Lorre Munroe, OTR/L  Lorre Munroe 04/06/2020, 10:22 AM

## 2020-04-06 NOTE — Plan of Care (Signed)
  Problem: Education: Goal: Knowledge of General Education information will improve Description: Including pain rating scale, medication(s)/side effects and non-pharmacologic comfort measures Outcome: Completed/Met   Problem: Health Behavior/Discharge Planning: Goal: Ability to manage health-related needs will improve Outcome: Completed/Met   Problem: Clinical Measurements: Goal: Ability to maintain clinical measurements within normal limits will improve Outcome: Completed/Met Goal: Will remain free from infection Outcome: Completed/Met Goal: Diagnostic test results will improve Outcome: Completed/Met Goal: Respiratory complications will improve Outcome: Completed/Met Goal: Cardiovascular complication will be avoided Outcome: Completed/Met   Problem: Activity: Goal: Risk for activity intolerance will decrease Outcome: Completed/Met   Problem: Nutrition: Goal: Adequate nutrition will be maintained Outcome: Completed/Met   Problem: Coping: Goal: Level of anxiety will decrease Outcome: Completed/Met   Problem: Elimination: Goal: Will not experience complications related to bowel motility Outcome: Completed/Met Goal: Will not experience complications related to urinary retention Outcome: Completed/Met   Problem: Pain Managment: Goal: General experience of comfort will improve Outcome: Completed/Met   Problem: Safety: Goal: Ability to remain free from injury will improve Outcome: Completed/Met   Problem: Skin Integrity: Goal: Risk for impaired skin integrity will decrease Outcome: Completed/Met   Problem: Education: Goal: Knowledge of disease or condition will improve Outcome: Completed/Met Goal: Knowledge of secondary prevention will improve Outcome: Completed/Met Goal: Knowledge of patient specific risk factors addressed and post discharge goals established will improve Outcome: Completed/Met Goal: Individualized Educational Video(s) Outcome: Completed/Met    Problem: Coping: Goal: Will verbalize positive feelings about self Outcome: Completed/Met Goal: Will identify appropriate support needs Outcome: Completed/Met   Problem: Health Behavior/Discharge Planning: Goal: Ability to manage health-related needs will improve Outcome: Completed/Met   Problem: Self-Care: Goal: Ability to participate in self-care as condition permits will improve Outcome: Completed/Met Goal: Verbalization of feelings and concerns over difficulty with self-care will improve Outcome: Completed/Met Goal: Ability to communicate needs accurately will improve Outcome: Completed/Met   Problem: Nutrition: Goal: Risk of aspiration will decrease Outcome: Completed/Met Goal: Dietary intake will improve Outcome: Completed/Met   Problem: Intracerebral Hemorrhage Tissue Perfusion: Goal: Complications of Intracerebral Hemorrhage will be minimized Outcome: Completed/Met   Problem: Ischemic Stroke/TIA Tissue Perfusion: Goal: Complications of ischemic stroke/TIA will be minimized Outcome: Completed/Met   Problem: Spontaneous Subarachnoid Hemorrhage Tissue Perfusion: Goal: Complications of Spontaneous Subarachnoid Hemorrhage will be minimized Outcome: Completed/Met

## 2020-04-06 NOTE — Discharge Summary (Addendum)
Physician Discharge Summary  [place] UEA:[id number] DOB: [birth year] DOA: 04/04/2020  PCP: Ashley Brunette, MD  Admit date: 04/04/2020 Discharge date: 04/06/2020  Admitted From: Home Disposition: Home with outpatient PT   Recommendations for Outpatient Follow-up:  1. Follow up with PCP in 1-2 weeks 2. Follow up with Cardiology Dr. Jacinto [name] on 04/08/20 for discussion about Loop Recorder vs. 30 Holter 3. Follow up with Neurology Dr. Pearlean [name] within 1-2 weeks  4. Follow-up for dentistry for poor dentition and possible odontogenic maxillary sinus infection 5. Please obtain CMP/CBC, Mag, Phos in one week 6. Please follow up on the following pending results: Follow-up CT scan for lung nodule within 1 year  Home Health: No Equipment/Devices: None   Discharge Condition: Stable CODE STATUS: FULL CODE  Diet recommendation: Heart Healthy Carb Modified Diet  Brief/Interim Summary: HPI per Dr. Shauna [name] on 04/04/20  74 year old female with past medical history of diabetes mellitus type 2, obesity, history of atrial tachycardia, hypertension who presents to [facility] emergency department with family due to concerns for confusion.  Patient is an extremely poor historian and therefore a full history could not be entirely obtained. History has been obtained from a combination of both the patient and the son who is at the bedside. Son explains that for approximately past 24 hours patient and been exhibiting confusion and lethargy with associated poor appetite.  Upon questioning the patient, she denies shortness of breath, cough, fever, sick contacts, lightheadedness, abdominal pain, dysuria. Patient denies any new medications or herbal supplements. Patient denies any recent travel, sick contacts or confirmed contact with known COVID-19 infection.  Upon being evaluated in the emergency department, patient was incidentally found to be exhibiting severe bradycardia with heart rates as  low as the 30s. The emergency department provider calledDr. Rosemary Holms with cardiology whograciously evaluated the patient at the bedside. It was recommended that the patient's atenolol be held with close monitoring on telemetry. He did not necessarily feel that the patient's lethargy and confusion was entirely secondary to the bradycardia and recommended a separate encephalopathy work-up. The hospitalist group was then called to assess patient for mission the hospital.  **Interim History Likely her symptoms are attributable to CVA and she is undergoing further work-up.  Neurology and cardiology have been both consulted.  Neurology started her on aspirin 325 and recommending permissive hypertension.  Cardiology is also consulted encephalopathy is explained by an age-indeterminate right temporal infarct and her bradycardia was reviewed in she has had this back in 2015.  Dr. Rosemary Holms recommends continue to hold atenolol and feels that she has no further ischemic work-up needed at this time.  PT OT evaluated and she was recommended for outpatient PT.  Neurology evaluated and recommending placing the patient on aspirin and Plavix for 3 weeks and then just Plavix alone monotherapy.  Patient is stable to be discharged at this time as she is complete her neurological work-up and encephalopathy is improved.  She will need to follow-up with cardiology, PCP as well as neurology in outpatient setting and she understands agrees with plan of care.  Discharge Diagnoses:  Principal Problem:   Acute metabolic encephalopathy Active Problems:   Essential hypertension   Controlled diabetes mellitus type II without complication (HCC)   Bradycardia   Elevated troponin level not due myocardial infarction   Cerebral embolism with cerebral infarction  Acute metabolic encephalopathy -Patient is exhibiting lethargy, confusion and disorientation according to history and on my examination. -Because of this encephalopathy  is not entirely  clear at this time. Cardiology does not feel that this degree of bradycardia is causing patient's changes in mentation. -In addition to withholding beta-blocker and reducing carvedilol dosing, we are performing a comprehensive encephalopathy work-up including obtaining vitamin B12, folate, TSH, ammonia, RPR, urine toxicology screen, urinalysis and CT imaging of the head. -Hydrating patient gently with intravenous isotonic fluids and now stopped -Monitoring for symptomatic improvement -No clinical evidence of meningitis -CT Head done and showed "Loss of gray-white differentiation geographic region of ill-defined hypoattenuation in the right temporal lobe concerning for age-indeterminate infarct, possibly late acute to subacute.  No other acute intracranial abnormality. Chronic microvascular angiopathy and parenchymal volume loss.Marland Kitchen Extensive periapical lucencies of the maxillary dentition including a left maxillary molar possibly suggesting an odontogenic origin of left maxillary sinus disease. Correlate with dental exam." -Neurology consulted and recommended CVA workup -CTA Head and Neck done and showed "Acute proximal right M2 branch occlusion, corresponding with previously identified evolving right MCA territory infarct. Otherwise negative CTA of the head and neck. No other large vessel occlusion, hemodynamically significant stenosis, or other acute vascular abnormality. 3 mm subpleural left upper lobe nodule, indeterminate -Patient's MRI done showed "Motion degraded. 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  No other acute intracranial abnormality. Chronic microvascular angiopathy and parenchymal volume loss.Marland Kitchen Extensive periapical lucencies of the maxillary dentition including a left maxillary molar possibly suggesting an odontogenic origin of left maxillary sinus disease. Correlate with dental exam." -Neurology consulted and recommended CVA workup -CTA Head and Neck done and showed "Acute proximal right M2 branch occlusion, corresponding with previously identified evolving right MCA territory infarct. Otherwise negative CTA of the head and neck. No other large vessel occlusion, hemodynamically significant stenosis, or other acute vascular abnormality. 3 mm subpleural left upper lobe nodule, indeterminate." -MRI of the brain was done and showed "Motion degraded. Acute right MCA territory infarction involving parietotemporal lobes and insula." -Echocardiogram done with bubble study showed an EF of 55 to 60% with normal left ventricular systolic function with mild left ventricular hypertrophy and left ventricular diastolic parameters being indeterminant. There is no evidence of i interarterial shunt -EEG done and showed "This study is suggestive ofcortical dysfunction in right temporal region which could be secondary to underlying structural abnormality.No seizures or epileptiform discharges were seen throughout the recording." -Carotid Dopplers, CT perfusion and MRA head were not done -Lipid panel done and hemoglobin A1c done -Lipid panel showed an LDL of 87 and hemoglobin A1c was 6.2 -Continue PT OT and SLP evaluations; she passed her bedside swallow screen so she was placed on a diabetic carb modified diet -Continue aspirin 325 mg p.o. daily/rectally will need to discuss with neurology given if she needs Plavix or not as is written recommending aspirin  Plavix for stroke prevention and aggressive risk factor modification was only written for aspirin 325; Dr. Pearlean [name] is recommending changing her to aspirin 81 mg p.o. daily and Plavix and 75 mg p.o. daily recommends continuing this for 3 weeks and then discontinue Plavix alone -Dr. Pearlean [name] feels that  she may be considered for the [employer] Stroke Prevention Study  but she declines to participate -She is also on atorvastatin 80 mg po Daily -She will need to see her cardiologist on 04/08/2020 other evaluation and hospital discharge and evaluation for loop recorder and or 30-day Holter monitor  Sinus Bradycardia -Patient's home regimen of atenolol has been discontinued -Reducing home regimen of clonidine from 0.2 mg twice daily to 0.1 mg twice daily. I do not want to completely discontinue this due to concern for rebound hypertension. -Cardiology(Dr. Patwardhan)evaluated patients and recommendations are appreciated. They do not feel that this degree of bradycardia is causing patient's confusion. -We will continue to monitor patient on telemetry, cardiology feels that medical telemetry unit is an adequate acuity for this patient. -Cardiology recommends to continue to hold atenolol for now  Essential Hypertension -As mentioned above, discontinuing atenolol, reducing home regimen of clonidine 0.1 mg twice daily -Continuing home regimen of valsartan with irbesartan substitution of 300 g p.o.; resume valsartan/HCTZ at discharge dailyandhydrochlorothiazide 12.5 mg p.o. daily  -Providing patient with as needed intravenous antihypertensives for excessively elevated blood pressures 10 mg every 6 as needed for systolic blood pressure in 180 or diastolic blood pressure than 110 -For now neurology recommends continuing permissive hypertension and normalizing in 5-7 Days -As blood pressure is 118/84  Controlled diabetes mellitus type II without complication (HCC) -C.w Accu-Cheks before every meal and  nightly with sliding scale insulin -Hemoglobin A1c 6.2 -CBGs ranging from 107-130  MildlyElevated troponin level not due myocardial infarction -Cardiology feels that patient is unlikely to be suffering from plaque rupture/ACS -Patient is chest pain-free -Cycling cardiac enzymes per cardiology recommendations -Continue Monitoring patient on telemetry -Continue ASA 325 mg po Daily  -Cardiology feels no further ischemic work-up is needed  Lung Nodule -Seen on CTA -3 mm subpleural left upper lobe nodule, indeterminate. -Follow up as an outpatient with Non-contrast chest CT 12 months if patient is high-risk  Thrombocytopenia -The patient platelet count is 133 -Continue to monitor for signs and symptoms of bleeding; currently no overt bleeding noted -Repeat CBC in a.m.  Left Maxillary Sinus Disease -The setting of her poor dentition -She will need a dentist evaluation in the outpatient setting when she is over her Acute CVA  Obesity -Estimated body mass index is 35.73 kg/m as calculated from the following:   Height as of this encounter: 5\' 8"  (1.727 m).   Weight as of this encounter: 106.6 kg. -Continued Dietary And Weight Loss counseling   Hypokalemia -Patient's potassium this morning was 3.2 -Replete -Continue monitor and trend and repeat CMP in the outpatient setting  Elevated AST -Trending down as patient is AST went from 64-53 next-continue monitor and trend in the outpatient setting  Thrombocytopenia -Mild and stable -Patient's platelet count is 138,000 -continue monitor and trend and repeat in outpatient setting  Discharge Instructions  Discharge Instructions    Ambulatory referral to Neurology   Complete by: As directed    Follow up in stroke clinic at [facility] with Ihor Austin, NP in about 4 weeks. If not available, consider Dr. Delia Heady, Dr. Jamelle Rushing, or Dr. Naomie Dean.   Ambulatory referral to Physical Therapy    Complete by: As directed    Call MD for:  difficulty breathing, headache or visual disturbances   Complete by: As directed    Call MD for:  extreme fatigue   Complete by: As directed    Call MD for:  hives   Complete by:  As directed    Call MD for:  persistant dizziness or light-headedness   Complete by: As directed    Call MD for:  persistant nausea and vomiting   Complete by: As directed    Call MD for:  redness, tenderness, or signs of infection (pain, swelling, redness, odor or green/yellow discharge around incision site)   Complete by: As directed    Call MD for:  severe uncontrolled pain   Complete by: As directed    Call MD for:  temperature >100.4   Complete by: As directed    Diet - low sodium heart healthy   Complete by: As directed    Diet Carb Modified   Complete by: As directed    Discharge instructions   Complete by: As directed    You were cared for by a hospitalist during your hospital stay. If you have any questions about your discharge medications or the care you received while you were in the hospital after you are discharged, you can call the unit and ask to speak with the hospitalist on call if the hospitalist that took care of you is not available. Once you are discharged, your primary care physician will handle any further medical issues. Please note that NO REFILLS for any discharge medications will be authorized once you are discharged, as it is imperative that you return to your primary care physician (or establish a relationship with a primary care physician if you do not have one) for your aftercare needs so that they can reassess your need for medications and monitor your lab values.  Follow up with PCP, Cardiology, and Neurology within 1-2 weeks. Take all medications as prescribed. If symptoms change or worsen please return to the ED for evaluation   Increase activity slowly   Complete by: As directed      Allergies as of 04/06/2020      Reactions    Bystolic [nebivolol Hcl]    Bradycardia   Coreg [carvedilol]    bradycardia   Minoxidil    Abdominal pain      Medication List    STOP taking these medications   atenolol 50 MG tablet Commonly known as: TENORMIN     TAKE these medications   acetaminophen 500 MG tablet Commonly known as: TYLENOL Take 500 mg by mouth daily as needed for mild pain or headache.   aspirin 81 MG tablet Take 1 tablet (81 mg total) by mouth daily for 21 days. occasionally What changed: when to take this   atorvastatin 80 MG tablet Commonly known as: LIPITOR Take 1 tablet (80 mg total) by mouth daily at 6 PM.   cetirizine 10 MG tablet Commonly known as: ZYRTEC Take 10 mg by mouth daily as needed for allergies.   cloNIDine 0.2 MG tablet Commonly known as: CATAPRES TAKE 1 TABLET (0.2 MG TOTAL) BY MOUTH 2 (TWO) TIMES DAILY. What changed: when to take this   clopidogrel 75 MG tablet Commonly known as: PLAVIX Take 1 tablet (75 mg total) by mouth daily. Have Neurology Refill Rx Start taking on: April 07, 2020   diphenhydrAMINE 12.5 MG/5ML liquid Commonly known as: BENADRYL Take 12.5 mg by mouth at bedtime as needed for itching or allergies.   ondansetron 4 MG tablet Commonly known as: ZOFRAN Take 1 tablet (4 mg total) by mouth every 6 (six) hours as needed for nausea.   polyethylene glycol 17 g packet Commonly known as: MIRALAX / GLYCOLAX Take 17 g by mouth daily as needed  for mild constipation.   valsartan-hydrochlorothiazide 320-12.5 MG tablet Commonly known as: DIOVAN-HCT TAKE 1 TABLET BY MOUTH EVERY DAY   Vitamin D3 125 MCG (5000 UT) Caps Take 1 capsule by mouth daily.       Follow-up Information    [facility] Follow up in 4 week(s).   Specialty: Neurology Why: stroke clinic. office will call with appt date and time Contact information: [address] [place] [address] [phone]             Allergies  Allergen  Reactions  . Bystolic [Nebivolol Hcl]     Bradycardia   . Coreg [Carvedilol]     bradycardia  . Minoxidil     Abdominal pain   Consultations:  Cardiology  Neurology  Procedures/Studies: CT ANGIO HEAD W OR WO CONTRAST  Result Date: 04/05/2020 CLINICAL DATA:  Initial evaluation for acute neuro deficit, stroke suspected. EXAM: CT ANGIOGRAPHY HEAD AND NECK TECHNIQUE: Multidetector CT imaging of the head and neck was performed using the standard protocol during bolus administration of intravenous contrast. Multiplanar CT image reconstructions and MIPs were obtained to evaluate the vascular anatomy. Carotid stenosis measurements (when applicable) are obtained utilizing NASCET criteria, using the distal internal carotid diameter as the denominator. CONTRAST:  OMNIPAQUE IOHEXOL 350 MG/ML SOLN COMPARISON:  Prior head CT from earlier the same day. FINDINGS: CTA NECK FINDINGS Aortic arch: Visualized aortic arch normal caliber with normal 3 vessel morphology. Mild atheromatous change within the arch itself. No hemodynamically significant stenosis seen about the origin of the great vessels. Right carotid system: Right common carotid artery patent from its origin to the bifurcation without stenosis. Mild eccentric calcified plaque at the right bifurcation without significant stenosis. Right ICA widely patent distally without stenosis, dissection or occlusion. Left carotid system: Left CCA patent from its origin to the bifurcation without stenosis. No significant atheromatous irregularity about the left bifurcation. Left ICA widely patent distally to the skull base without stenosis dissection or occlusion. Vertebral arteries: Both vertebral arteries arise from the subclavian arteries. No proximal subclavian artery stenosis. Vertebral arteries patent within the neck without stenosis, dissection or occlusion. Skeleton: No acute osseous abnormality. No discrete or worrisome osseous lesions. Other neck: No  other acute soft tissue abnormality within the neck. No mass lesion or adenopathy. Upper chest: Visualized upper chest demonstrates no acute finding. 3 mm subpleural left upper lobe nodule noted (series 5, image 184), indeterminate. Review of the MIP images confirms the above findings CTA HEAD FINDINGS Anterior circulation: Both internal carotid arteries patent to the termini without stenosis or other abnormality. A1 segments widely patent. Normal anterior communicating artery complex. Anterior cerebral arteries widely patent to their distal aspects. Left M1 segment widely patent. Normal left MCA bifurcation. Distal left MCA branches well perfused. Right M1 widely patent. Normal right MCA bifurcation. There is acute occlusion of a proximal right M2 branch (series 7, image 99). Remainder of the right MCA branches remain perfused. Posterior circulation: Vertebral arteries patent to the vertebrobasilar junction without stenosis. Both picas patent. Basilar widely patent to its distal aspect without stenosis. Superior cerebral arteries patent bilaterally. Left PCA supplied via the basilar as well as a robust left posterior communicating artery. Fetal type origin of the right PCA. Both PCAs well perfused to their distal aspects without stenosis. Venous sinuses: Patent allowing for timing the contrast bolus. Anatomic variants: Fetal type origin of the right PCA. No intracranial aneurysm. Review of the MIP images confirms the above findings IMPRESSION:  1. 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 2. Otherwise negative CTA of the head and neck. No other large vessel occlusion, hemodynamically significant stenosis, or other acute vascular abnormality. 3. 3 mm subpleural left upper lobe nodule, indeterminate. No follow-up needed if patient is low-risk. Non-contrast chest CT can be considered in 12 months if patient is high-risk. This recommendation  follows the consensus statement: Guidelines for Management of Incidental Pulmonary Nodules Detected on CT Images: From the Fleischner Society 2017; Radiology 2017; 284:228-243. Electronically Signed   By: Rise Mu M.D.   On: 04/05/2020 01:14   CT HEAD WO CONTRAST  Result Date: 04/04/2020 CLINICAL DATA:  Mental status change EXAM: CT HEAD WITHOUT CONTRAST TECHNIQUE: Contiguous axial images were obtained from the base of the skull through the vertex without intravenous contrast. COMPARISON:  MRI 06/10/2009, paranasal CT 10/13/2009 FINDINGS: Brain: Loss of gray-white differentiation and ill-defined hypoattenuation in the right temporal lobe concerning for an age-indeterminate infarct not present on comparison imaging from 20/10, 2011. No other CT evident areas of acute vascular territory or cortically based infarct. No hemorrhage, hydrocephalus, extra-axial collection or mass lesion/mass effect. Symmetric prominence of the ventricles, cisterns and sulci compatible with parenchymal volume loss. Additional patchy areas of white matter hypoattenuation are most compatible with chronic microvascular angiopathy. Vascular: Atherosclerotic calcification of the carotid siphons. No hyperdense vessel. Skull: No calvarial fracture or suspicious osseous lesion. No scalp swelling or hematoma. Sinuses/Orbits: Few pneumatized secretions noted in the left maxillary sinus. Extensive periapical lucencies of the maxillary dentition including a left maxillary molar possibly suggesting an odontogenic origin of the paranasal sinus disease. Included orbital structures are unremarkable. Other: None. IMPRESSION: 1. Loss of gray-white differentiation geographic region of ill-defined hypoattenuation in the right temporal lobe concerning for age-indeterminate infarct, possibly late acute to subacute. 2. No other acute intracranial abnormality. 3. Chronic microvascular angiopathy and parenchymal volume loss. 4. Extensive periapical  lucencies of the maxillary dentition including a left maxillary molar possibly suggesting an odontogenic origin of left maxillary sinus disease. Correlate with dental exam. Critical Value/emergent results were called by telephone at the time of interpretation on 04/04/2020 at 11:10 pm to provider [facility] , who verbally acknowledged these results. Electronically Signed   By: Kreg Shropshire M.D.   On: 04/04/2020 23:07   CT ANGIO NECK W OR WO CONTRAST  Result Date: 04/05/2020 CLINICAL DATA:  Initial evaluation for acute neuro deficit, stroke suspected. EXAM: CT ANGIOGRAPHY HEAD AND NECK TECHNIQUE: Multidetector CT imaging of the head and neck was performed using the standard protocol during bolus administration of intravenous contrast. Multiplanar CT image reconstructions and MIPs were obtained to evaluate the vascular anatomy. Carotid stenosis measurements (when applicable) are obtained utilizing NASCET criteria, using the distal internal carotid diameter as the denominator. CONTRAST:  OMNIPAQUE IOHEXOL 350 MG/ML SOLN COMPARISON:  Prior head CT from earlier the same day. FINDINGS: CTA NECK FINDINGS Aortic arch: Visualized aortic arch normal caliber with normal 3 vessel morphology. Mild atheromatous change within the arch itself. No hemodynamically significant stenosis seen about the origin of the great vessels. Right carotid system: Right common carotid artery patent from its origin to the bifurcation without stenosis. Mild eccentric calcified plaque at the right bifurcation without significant stenosis. Right ICA widely patent distally without stenosis, dissection or occlusion. Left carotid system: Left CCA patent from its origin to the bifurcation without stenosis. No significant atheromatous irregularity about the left bifurcation. Left ICA widely patent distally to  the skull base without stenosis dissection or occlusion. Vertebral arteries: Both vertebral arteries arise from the subclavian arteries.  No proximal subclavian artery stenosis. Vertebral arteries patent within the neck without stenosis, dissection or occlusion. Skeleton: No acute osseous abnormality. No discrete or worrisome osseous lesions. Other neck: No other acute soft tissue abnormality within the neck. No mass lesion or adenopathy. Upper chest: Visualized upper chest demonstrates no acute finding. 3 mm subpleural left upper lobe nodule noted (series 5, image 184), indeterminate. Review of the MIP images confirms the above findings CTA HEAD FINDINGS Anterior circulation: Both internal carotid arteries patent to the termini without stenosis or other abnormality. A1 segments widely patent. Normal anterior communicating artery complex. Anterior cerebral arteries widely patent to their distal aspects. Left M1 segment widely patent. Normal left MCA bifurcation. Distal left MCA branches well perfused. Right M1 widely patent. Normal right MCA bifurcation. There is acute occlusion of a proximal right M2 branch (series 7, image 99). Remainder of the right MCA branches remain perfused. Posterior circulation: Vertebral arteries patent to the vertebrobasilar junction without stenosis. Both picas patent. Basilar widely patent to its distal aspect without stenosis. Superior cerebral arteries patent bilaterally. Left PCA supplied via the basilar as well as a robust left posterior communicating artery. Fetal type origin of the right PCA. Both PCAs well perfused to their distal aspects without stenosis. Venous sinuses: Patent allowing for timing the contrast bolus. Anatomic variants: Fetal type origin of the right PCA. No intracranial aneurysm. Review of the MIP images confirms the above findings IMPRESSION: 1. 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 2. Otherwise negative CTA of the head and neck. No other large vessel occlusion, hemodynamically significant stenosis, or other acute  vascular abnormality. 3. 3 mm subpleural left upper lobe nodule, indeterminate. No follow-up needed if patient is low-risk. Non-contrast chest CT can be considered in 12 months if patient is high-risk. This recommendation follows the consensus statement: Guidelines for Management of Incidental Pulmonary Nodules Detected on CT Images: From the Fleischner Society 2017; Radiology 2017; 284:228-243. Electronically Signed   By: Rise Mu M.D.   On: 04/05/2020 01:14   MR BRAIN W WO CONTRAST  Result Date: 04/05/2020 CLINICAL DATA:  Right MCA stroke EXAM: MRI HEAD WITHOUT AND WITH CONTRAST TECHNIQUE: Multiplanar, multiecho pulse sequences of the brain and surrounding structures were obtained without and with intravenous contrast. CONTRAST:  10mL GADAVIST GADOBUTROL 1 MMOL/ML IV SOLN COMPARISON:  Correlation made with prior CT imaging FINDINGS: Motion artifact is present. Brain: There is restricted diffusion involving right parietotemporal lobes and insula. No evidence of hemorrhagic transformation. There is no intracranial mass or significant mass effect. Patchy foci of T2 hyperintensity in the supratentorial white matter are nonspecific but may reflect mild chronic microvascular ischemic changes. There is no hydrocephalus or extra-axial fluid collection. No abnormal enhancement. Vascular: Major vessel flow voids at the skull base are preserved. Skull and upper cervical spine: Normal marrow signal is preserved. Sinuses/Orbits: Nonspecific left maxillary sinus air-fluid level. Orbits are unremarkable. Other: Sella is unremarkable.  Mastoid air cells are clear. IMPRESSION: Motion degraded. 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 Electronically Signed   By: Guadlupe Spanish M.D.   On: 04/05/2020 19:59   DG Chest Port 1 View  Result Date: 04/04/2020 CLINICAL DATA:  Shortness of breath EXAM: PORTABLE CHEST 1 VIEW COMPARISON:  October 12, 2014 FINDINGS: There is unchanged  cardiomegaly. Both lungs are clear. The visualized skeletal  structures are unremarkable. IMPRESSION: No active disease. Electronically Signed   By: Jonna Clark M.D.   On: 04/04/2020 19:44   EEG adult  Result Date: 04/05/2020 Charlsie Quest, MD     04/05/2020 12:42 PM Patient Name: Ashley [name] MRN: [id number] Epilepsy Attending: Charlsie Quest Referring Physician/Provider: Dr Delia Heady Date: 04/05/2020 Duration: 25.04 mins Patient history: 74 y.o. female with history of DM2, HTN, atrial tachycardia, hx of bradycardia with BB's, obesity and recent stress over the loss of a close friend who presented to the ED via EMS on Saturday evening for evaluation of AMS, HA, abnormal sounding speech, possible brief LOC, and transient bradycardia. EEG to evaluate for seizure. Level of alertness: Awake, asleep AEDs during EEG study: None Technical aspects: This EEG study was done with scalp electrodes positioned according to the 10-20 International system of electrode placement. Electrical activity was acquired at a sampling rate of 500Hz  and reviewed with a high frequency filter of 70Hz  and a low frequency filter of 1Hz . EEG data were recorded continuously and digitally stored. Description: The posterior dominant rhythm consists of 9-10 Hz activity of moderate voltage (25-35 uV) seen predominantly in posterior head regions, symmetric and reactive to eye opening and eye closing. Sleep was characterized by vertex waves, sleep spindles (12 to 14 Hz), maximal frontocentral region.  EEG showed intermittent 3 to 6 Hz theta-delta slowing in right temporal region .Hyperventilation and photic stimulation were not performed.  ABNORMALITY - Intermittent slow, right temporal region IMPRESSION: This study is suggestive of cortical dysfunction in right temporal region which could be secondary to underlying structural abnormality. No seizures or epileptiform discharges were seen throughout the recording.    ECHOCARDIOGRAM COMPLETE BUBBLE STUDY  Result Date: 04/05/2020    ECHOCARDIOGRAM REPORT   Patient Name:   ARON NEEDLES [facility] Date of Exam: 04/05/2020 Medical Rec #:  Luz Brazen            Height:       68.0 in Accession #:    [facility]           Weight:       235.0 lb Date of Birth:  04-11-1946            BSA:          2.189 m Patient Age:    73 years             BP:           160/115 mmHg Patient Gender: F                    HR:           38 bpm. Exam Location:  Inpatient Procedure: 2D Echo, Intracardiac Opacification Agent and Saline Contrast Bubble            Study Indications:    Stroke 434.91 / I63.9  History:        Patient has no prior history of Echocardiogram examinations.                 Risk Factors:Diabetes and Hypertension. Acute metabolic                 encephalopathy, Sinus bradycardia.  Sonographer:    [id number] RDCS Referring Phys: [id number] 13/09/1945 [facility] IMPRESSIONS  1. Left ventricular ejection fraction, by estimation, is 55 to 60%. The left ventricle has normal function. The left ventricle has no regional wall motion abnormalities. There is  mild left ventricular hypertrophy. Left ventricular diastolic parameters are indeterminate.  2. Right ventricular systolic function is normal. The right ventricular size is normal.  3. Left atrial size was moderately dilated.  4. Nosignificant valvular abnormality.  5. Agitated saline contrast bubble study was negative, with no evidence of any interatrial shunt. FINDINGS  Left Ventricle: Left ventricular ejection fraction, by estimation, is 55 to 60%. The left ventricle has normal function. The left ventricle has no regional wall motion abnormalities. Definity contrast agent was given IV to delineate the left ventricular  endocardial borders. The left ventricular internal cavity size was normal in size. There is mild left ventricular hypertrophy. Left ventricular diastolic parameters are indeterminate. Right Ventricle: The right ventricular size  is normal. No increase in right ventricular wall thickness. Right ventricular systolic function is normal. Left Atrium: Left atrial size was moderately dilated. Right Atrium: Right atrial size was normal in size. Pericardium: There is no evidence of pericardial effusion. Mitral Valve: The mitral valve is grossly normal. No evidence of mitral valve regurgitation. Tricuspid Valve: The tricuspid valve is grossly normal. Tricuspid valve regurgitation is not demonstrated. Aortic Valve: The aortic valve is tricuspid. Aortic valve regurgitation is not visualized. Pulmonic Valve: The pulmonic valve was normal in structure. Pulmonic valve regurgitation is not visualized. Aorta: The aortic root is normal in size and structure. IAS/Shunts: No atrial level shunt detected by color flow Doppler. Agitated saline contrast was given intravenously to evaluate for intracardiac shunting. Agitated saline contrast bubble study was negative, with no evidence of any interatrial shunt.  LEFT VENTRICLE PLAX 2D LVIDd:         5.00 cm LVIDs:         4.10 cm LV PW:         1.10 cm LV IVS:        1.40 cm LVOT diam:     2.20 cm LV SV:         96 LV SV Index:   44 LVOT Area:     3.80 cm  LEFT ATRIUM            Index LA Vol (A4C): 105.0 ml 47.97 ml/m  AORTIC VALVE LVOT Vmax:   105.00 cm/s LVOT Vmean:  63.200 cm/s LVOT VTI:    0.252 m  AORTA Ao Root diam: 3.70 cm Ao Asc diam:  4.30 cm MITRAL VALVE MV Area (PHT): 4.85 cm    SHUNTS MV Decel Time: 156 msec    Systemic VTI:  0.25 m MV E velocity: 88.97 cm/s  Systemic Diam: 2.20 cm Manish Patwardhan MD Electronically signed by Truett Mainland MD Signature Date/Time: 04/05/2020/8:57:20 AM    Final     EEG This study is suggestive ofcortical dysfunction in right temporal region which could be secondary to underlying structural abnormality.No seizures or epileptiform discharges were seen throughout the recording.  ECHOCARDIOGRAM with Bubble IMPRESSIONS    1. Left ventricular ejection  fraction, by estimation, is 55 to 60%. The  left ventricle has normal function. The left ventricle has no regional  wall motion abnormalities. There is mild left ventricular hypertrophy.  Left ventricular diastolic parameters  are indeterminate.  2. Right ventricular systolic function is normal. The right ventricular  size is normal.  3. Left atrial size was moderately dilated.  4. Nosignificant valvular abnormality.  5. Agitated saline contrast bubble study was negative, with no evidence  of any interatrial shunt.   FINDINGS  Left Ventricle: Left ventricular ejection fraction, by estimation, is 55  to  60%. The left ventricle has normal function. The left ventricle has no  regional wall motion abnormalities. Definity contrast agent was given IV  to delineate the left ventricular  endocardial borders. The left ventricular internal cavity size was normal  in size. There is mild left ventricular hypertrophy. Left ventricular  diastolic parameters are indeterminate.   Right Ventricle: The right ventricular size is normal. No increase in  right ventricular wall thickness. Right ventricular systolic function is  normal.   Left Atrium: Left atrial size was moderately dilated.   Right Atrium: Right atrial size was normal in size.   Pericardium: There is no evidence of pericardial effusion.   Mitral Valve: The mitral valve is grossly normal. No evidence of mitral  valve regurgitation.   Tricuspid Valve: The tricuspid valve is grossly normal. Tricuspid valve  regurgitation is not demonstrated.   Aortic Valve: The aortic valve is tricuspid. Aortic valve regurgitation is  not visualized.   Pulmonic Valve: The pulmonic valve was normal in structure. Pulmonic valve  regurgitation is not visualized.   Aorta: The aortic root is normal in size and structure.   IAS/Shunts: No atrial level shunt detected by color flow Doppler. Agitated  saline contrast was given intravenously to  evaluate for intracardiac  shunting. Agitated saline contrast bubble study was negative, with no  evidence of any interatrial shunt.     LEFT VENTRICLE  PLAX 2D  LVIDd:     5.00 cm  LVIDs:     4.10 cm  LV PW:     1.10 cm  LV IVS:    1.40 cm  LVOT diam:   2.20 cm  LV SV:     96  LV SV Index:  44  LVOT Area:   3.80 cm     LEFT ATRIUM      Index  LA Vol (A4C): 105.0 ml 47.97 ml/m  AORTIC VALVE  LVOT Vmax:  105.00 cm/s  LVOT Vmean: 63.200 cm/s  LVOT VTI:  0.252 m    AORTA  Ao Root diam: 3.70 cm  Ao Asc diam: 4.30 cm   MITRAL VALVE  MV Area (PHT): 4.85 cm  SHUNTS  MV Decel Time: 156 msec  Systemic VTI: 0.25 m  MV E velocity: 88.97 cm/s Systemic Diam: 2.20 cm   Subjective: Patient was seen and examined at bedside and she was walking with therapy.  Had no issues.  Felt like she was back to herself and was much improved.  No nausea or vomiting.  Any current complaints.  No other concerns or complaints at this time.  Discharge Exam: Vitals:   04/05/20 2212 04/06/20 0821  BP: 92/80 118/84  Pulse: 74 70  Resp:  16  Temp:  (!) 97.4 F (36.3 C)  SpO2:     Vitals:   04/05/20 1302 04/05/20 1558 04/05/20 2212 04/06/20 0821  BP: (!) 144/72 (!) 160/80 92/80 118/84  Pulse:  95 74 70  Resp: (!) 21 19  16   Temp:  98.3 F (36.8 C)  (!) 97.4 F (36.3 C)  TempSrc:  Oral    SpO2:  99%    Weight:      Height:       General: Pt is alert, awake, not in acute distress Cardiovascular: RRR, S1/S2 +, no rubs, no gallops Respiratory: Diminished bilaterally, no wheezing, no rhonchi Abdominal: Soft, NT, distended secondary body habitus, bowel sounds + Extremities: Mild edema, no cyanosis  The results of significant diagnostics from this hospitalization (including imaging, microbiology,  ancillary and laboratory) are listed below for reference.    Microbiology: Recent Results (from the past 240 hour(s))  SARS Coronavirus 2 by RT PCR  (hospital order, performed in [facility] hospital lab) Nasopharyngeal Nasopharyngeal Swab     Status: None   Collection Time: 04/04/20  7:25 PM   Specimen: Nasopharyngeal Swab  Result Value Ref Range Status   SARS Coronavirus 2 NEGATIVE NEGATIVE Final    Comment: (NOTE) SARS-CoV-2 target nucleic acids are NOT DETECTED.  The SARS-CoV-2 RNA is generally detectable in upper and lower respiratory specimens during the acute phase of infection. The lowest concentration of SARS-CoV-2 viral copies this assay can detect is 250 copies / mL. A negative result does not preclude SARS-CoV-2 infection and should not be used as the sole basis for treatment or other patient management decisions.  A negative result may occur with improper specimen collection / handling, submission of specimen other than nasopharyngeal swab, presence of viral mutation(s) within the areas targeted by this assay, and inadequate number of viral copies (<250 copies / mL). A negative result must be combined with clinical observations, patient history, and epidemiological information.  Fact Sheet for Patients:   BoilerBrush.com.cy  Fact Sheet for Healthcare Providers: https://pope.com/  This test is not yet approved or  cleared by the [place] FDA and has been authorized for detection and/or diagnosis of SARS-CoV-2 by FDA under an Emergency Use Authorization (EUA).  This EUA will remain in effect (meaning this test can be used) for the duration of the COVID-19 declaration under Section 564(b)(1) of the Act, 21 U.S.C. section 360bbb-3(b)(1), unless the authorization is terminated or revoked sooner.  Performed at [facility], [address], [place], [place] [address]   Culture, Urine     Status: None   Collection Time: 04/04/20 11:15 PM   Specimen: Urine, Random  Result Value Ref Range Status   Specimen Description URINE, RANDOM  Final   Special Requests  NONE  Final   Culture   Final    NO GROWTH Performed at [facility], [address] [place], [place] [address]    Report Status 04/06/2020 FINAL  Final    Labs: BNP (last 3 results) Recent Labs    04/04/20 1925  BNP 738.8*   Basic Metabolic Panel: Recent Labs  Lab 04/04/20 1925 04/04/20 1946 04/05/20 0334 04/06/20 1035  NA 140 141 140 137  K 3.8 3.8 3.5 3.2*  CL 105 101 102 100  CO2 25  --  26 26  GLUCOSE 117* 115* 124* 156*  BUN 6* 9 6* 11  CREATININE 0.87 0.80 0.85 1.03*  CALCIUM 9.1  --  9.3 9.6  MG 1.7  --  2.1 1.9  PHOS 2.5  --   --  2.9   Liver Function Tests: Recent Labs  Lab 04/04/20 1925 04/05/20 0334 04/06/20 1035  AST 40 64* 53*  ALT 30 43 38  ALKPHOS 56 61 64  BILITOT 0.8 0.7 1.1  PROT 6.8 7.0 7.1  ALBUMIN 3.4* 3.5 3.4*   No results for input(s): LIPASE, AMYLASE in the last 168 hours. Recent Labs  Lab 04/04/20 2307  AMMONIA 23   CBC: Recent Labs  Lab 04/04/20 1925 04/04/20 1946 04/05/20 0334 04/06/20 1035  WBC 7.2  --  8.9 9.0  NEUTROABS 3.6  --  5.2 5.8  HGB 12.4 15.0 12.8 13.3  HCT 38.7 44.0 40.1 42.4  MCV 73.6*  --  73.8* 72.9*  PLT PLATELET CLUMPS  NOTED ON SMEAR, UNABLE TO ESTIMATE  --  133* 138*   Cardiac Enzymes: No results for input(s): CKTOTAL, CKMB, CKMBINDEX, TROPONINI in the last 168 hours. BNP: Invalid input(s): POCBNP CBG: Recent Labs  Lab 04/05/20 1301 04/05/20 1635 04/05/20 2211 04/06/20 0818 04/06/20 1236  GLUCAP 107* 130* 120* 119* 112*   D-Dimer No results for input(s): DDIMER in the last 72 hours. Hgb A1c Recent Labs    04/04/20 1925  HGBA1C 6.2*   Lipid Profile Recent Labs    04/05/20 0334  CHOL 163  HDL 63  LDLCALC 87  TRIG 64  CHOLHDL 2.6   Thyroid function studies Recent Labs    04/04/20 1925  TSH 2.452   Anemia work up Recent Labs    04/04/20 1925  VITAMINB12 344  FOLATE 13.5   Urinalysis    Component Value Date/Time   COLORURINE STRAW (A) 04/04/2020 2315    APPEARANCEUR CLEAR 04/04/2020 2315   LABSPEC 1.010 04/04/2020 2315   PHURINE 8.0 04/04/2020 2315   GLUCOSEU NEGATIVE 04/04/2020 2315   HGBUR SMALL (A) 04/04/2020 2315   BILIRUBINUR NEGATIVE 04/04/2020 2315   KETONESUR NEGATIVE 04/04/2020 2315   PROTEINUR NEGATIVE 04/04/2020 2315   NITRITE NEGATIVE 04/04/2020 2315   LEUKOCYTESUR NEGATIVE 04/04/2020 2315   Sepsis Labs Invalid input(s): PROCALCITONIN,  WBC,  LACTICIDVEN Microbiology Recent Results (from the past 240 hour(s))  SARS Coronavirus 2 by RT PCR (hospital order, performed in [facility] hospital lab) Nasopharyngeal Nasopharyngeal Swab     Status: None   Collection Time: 04/04/20  7:25 PM   Specimen: Nasopharyngeal Swab  Result Value Ref Range Status   SARS Coronavirus 2 NEGATIVE NEGATIVE Final    Comment: (NOTE) SARS-CoV-2 target nucleic acids are NOT DETECTED.  The SARS-CoV-2 RNA is generally detectable in upper and lower respiratory specimens during the acute phase of infection. The lowest concentration of SARS-CoV-2 viral copies this assay can detect is 250 copies / mL. A negative result does not preclude SARS-CoV-2 infection and should not be used as the sole basis for treatment or other patient management decisions.  A negative result may occur with improper specimen collection / handling, submission of specimen other than nasopharyngeal swab, presence of viral mutation(s) within the areas targeted by this assay, and inadequate number of viral copies (<250 copies / mL). A negative result must be combined with clinical observations, patient history, and epidemiological information.  Fact Sheet for Patients:   BoilerBrush.com.cy  Fact Sheet for Healthcare Providers: https://pope.com/  This test is not yet approved or  cleared by the [place] FDA and has been authorized for detection and/or diagnosis of SARS-CoV-2 by FDA under an Emergency Use Authorization (EUA).   This EUA will remain in effect (meaning this test can be used) for the duration of the COVID-19 declaration under Section 564(b)(1) of the Act, 21 U.S.C. section 360bbb-3(b)(1), unless the authorization is terminated or revoked sooner.  Performed at [facility], [address], [place], [place] [address]   Culture, Urine     Status: None   Collection Time: 04/04/20 11:15 PM   Specimen: Urine, Random  Result Value Ref Range Status   Specimen Description URINE, RANDOM  Final   Special Requests NONE  Final   Culture   Final    NO GROWTH Performed at [facility], [address], [place], [place] [address]    Report Status 04/06/2020 FINAL  Final   Time coordinating discharge: 35 minutes  SIGNED:  Heloise Beecham  Jaquaveon Bilal, DO [facility] 04/06/2020, 2:39 PM Pager is on AMION  If 7PM-7AM, please contact night-coverage [email or web address]

## 2020-04-06 NOTE — Progress Notes (Signed)
STROKE TEAM PROGRESS NOTE       INTERVAL HISTORY Patient is doing well.  She is sitting up in bedside chair.  She is ready to go home.  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  Echocardiogram shows normal ejection fraction without cardiac source of embolism.  Heart rate is doing better. Patient refused participation in the [employer] stroke prevention trial  OBJECTIVE Vitals:   04/05/20 1302 04/05/20 1558 04/05/20 2212 04/06/20 0821  BP: (!) 144/72 (!) 160/80 92/80 118/84  Pulse:  95 74 70  Resp: (!) 21 19  16   Temp:  98.3 F (36.8 C)  (!) 97.4 F (36.3 C)  TempSrc:  Oral    SpO2:  99%    Weight:      Height:        CBC:  Recent Labs  Lab 04/05/20 0334 04/06/20 1035  WBC 8.9 9.0  NEUTROABS 5.2 5.8  HGB 12.8 13.3  HCT 40.1 42.4  MCV 73.8* 72.9*  PLT 133* 138*    Basic Metabolic Panel:  Recent Labs  Lab 04/04/20 1925 04/04/20 1946 04/05/20 0334 04/06/20 1035  NA 140   < > 140 137  K 3.8   < > 3.5 3.2*  CL 105   < > 102 100  CO2 25   < > 26 26  GLUCOSE 117*   < > 124* 156*  BUN 6*   < > 6* 11  CREATININE 0.87   < > 0.85 1.03*  CALCIUM 9.1   < > 9.3 9.6  MG 1.7   < > 2.1 1.9  PHOS 2.5  --   --  2.9   < > = values in this interval not displayed.    Lipid Panel:     Component Value Date/Time   CHOL 163 04/05/2020 0334   TRIG 64 04/05/2020 0334   HDL 63 04/05/2020 0334   CHOLHDL 2.6 04/05/2020 0334   VLDL 13 04/05/2020 0334   LDLCALC 87 04/05/2020 0334   HgbA1c:  Lab Results  Component Value Date   HGBA1C 6.2 (H) 04/04/2020   Urine Drug Screen:     Component Value Date/Time   LABOPIA NONE DETECTED 04/04/2020 2315   COCAINSCRNUR NONE DETECTED 04/04/2020 2315   LABBENZ NONE DETECTED 04/04/2020 2315   AMPHETMU NONE DETECTED 04/04/2020 2315   THCU NONE DETECTED 04/04/2020 2315   LABBARB NONE DETECTED 04/04/2020 2315    Alcohol Level No results found for: ETH  IMAGING  CT ANGIO HEAD W OR WO CONTRAST CT ANGIO NECK W OR WO  CONTRAST 04/05/2020 IMPRESSION:  1. 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  2. Otherwise negative CTA of the head and neck. No other large vessel occlusion, hemodynamically significant stenosis, or other acute vascular abnormality.  3. 3 mm subpleural left upper lobe nodule, indeterminate. No follow-up needed if patient is low-risk. Non-contrast chest CT can be considered in 12 months if patient is high-risk. This recommendation follows the consensus statement: Guidelines for Management of Incidental Pulmonary Nodules Detected on CT Images: From the Fleischner Society 2017; Radiology 2017; 284:228-243.  CT HEAD WO CONTRAST 04/04/2020 IMPRESSION:  1. Loss of gray-white differentiation geographic region of ill-defined hypoattenuation in the right temporal lobe concerning for age-indeterminate infarct, possibly late acute to subacute.  2. No other acute intracranial abnormality.  3. Chronic microvascular angiopathy and parenchymal volume loss.  4. Extensive periapical lucencies of the maxillary dentition including a left maxillary  molar possibly suggesting an odontogenic origin of left maxillary sinus disease. Correlate with dental exam.   DG Chest Port 1 View 04/04/2020 IMPRESSION:  No active disease.   ECHOCARDIOGRAM COMPLETE BUBBLE STUDY 04/05/2020 IMPRESSIONS   1. Left ventricular ejection fraction, by estimation, is 55 to 60%. The left ventricle has normal function. The left ventricle has no regional wall motion abnormalities. There is mild left ventricular hypertrophy. Left ventricular diastolic parameters are indeterminate.   2. Right ventricular systolic function is normal. The right ventricular size is normal.   3. Left atrial size was moderately dilated.   4. Nosignificant valvular abnormality.   5. Agitated saline contrast bubble study was negative, with no evidence of any interatrial shunt.   ECG - SB  rate 35 BPM. (See cardiology reading for complete details)  EEG -right temporal slowing.  No seizures  PHYSICAL EXAM Blood pressure 118/84, pulse 70, temperature (!) 97.4 F (36.3 C), resp. rate 16, height 5\' 8"  (1.727 m), weight 106.6 kg, SpO2 99 %. Pleasant elderly obese African-American lady not in distress. . Afebrile. Head is nontraumatic. Neck is supple without bruit.    Cardiac exam no murmur or gallop. Lungs are clear to auscultation. Distal pulses are well felt. Neurological Exam ;  Awake  Alert oriented x 3. Normal speech and language.eye movements full without nystagmus.fundi were not visualized. Vision acuity   appears normal.  But has partial left homonymous hemianopsia.  Hearing is normal. Palatal movements are normal.  Mild left lower facial asymmetry.. Tongue midline. Normal strength, tone, reflexes and coordination. Normal sensation. Gait deferred.       ASSESSMENT/PLAN [name] is a 74 y.o. female with history of DM2, HTN, atrial tachycardia, hx of bradycardia with BB's, obesity and recent stress over the loss of a close friend who presented to the ED via EMS on Saturday evening for evaluation of AMS, HA, abnormal sounding speech, possible brief LOC, and transient bradycardia. She did not receive IV t-PA due to late presentation (>4.5 hours from time of onset).  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  Resultant intermittent confusion and left-sided peripheral vision loss  Code Stroke CT Head - not ordered  CT head - Loss of gray-white differentiation geographic region of ill-defined hypoattenuation in the right temporal lobe concerning for age-indeterminate infarct, possibly late acute to subacute. Chronic microvascular angiopathy and parenchymal volume loss.   MRI head -motion degraded but acute right MCA parietal and insular infarct  MRA head - not ordered  CTA H&N - Acute proximal right M2  branch occlusion, corresponding with previously identified evolving right MCA territory infarct.   CT Perfusion - not ordered  Carotid Doppler - CTA neck performed - carotid dopplers not indicated.  2D Echo - EF 55 to 60%. No cardiac source of emboli identified.   Sars Corona Virus 2 - negative  LDL - 87  HgbA1c - 6.2  UDS - negative  VTE prophylaxis - Lovenox Diet  Diet Order            Diet Carb Modified Fluid consistency: Thin; Room service appropriate? Yes  Diet effective now                 aspirin 81 mg daily prior to admission, now on aspirin 300 mg suppository daily  Patient counseled to be compliant with her antithrombotic medications  Ongoing aggressive stroke risk factor management  Therapy recommendations: Outpatient PT OT Disposition: Home Hypertension  Home  BP meds: Tenormin , Catapres, Valsartan, HCTZ  Current BP meds: HCTZ, Irbasartan, Clonidine  Stable . Permissive hypertension (OK if < 220/120) but gradually normalize in 5-7 days  . Long-term BP goal normotensive  Hyperlipidemia  Home Lipid lowering medication: none   LDL 87, goal < 70  Current lipid lowering medication: Lipitor 80 mg daily   Continue statin at discharge  Diabetes  Home diabetic meds: none   Current diabetic meds: SSI   HgbA1c 6.2, goal < 7.0 Recent Labs    04/05/20 2211 04/06/20 0818 04/06/20 1236  GLUCAP 120* 119* 112*    Other Stroke Risk Factors  Advanced age  Obesity, Body mass index is 35.73 kg/m., recommend weight loss, diet and exercise as appropriate   Family hx stroke (mother)   Other Active Problems  Code status - Full code  Bradycardia with brief LOC - beta blocker held (hx of atrial tachycardia)  3 mm subpleural left upper lobe nodule, indeterminate. No follow-up needed if patient is low-risk. Non-contrast chest CT can be considered in 12 months if patient is high-risk.   Extensive periapical lucencies of the maxillary dentition  including a left maxillary molar possibly suggesting an odontogenic origin of left maxillary sinus disease. Correlate with dental exam.    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  Recommend aspirin and Plavix for 3 months followed by Plavix alone for stroke prevention and aggressive risk factor modification.  Long discussion with patient   and answered questions.  Discussed with Dr. Rosemary Holms cardiology team will arrange for outpatient 30-day monitoring or loop recorder.  Greater than 50% time during this 25-minute visit was spent in counseling and coordination of care about embolic stroke and discussion with stroke prevention treatment and answering questions.  Discussed with Dr. Kandis Mannan.  Stroke team will sign off.  Kindly call for questions.  Follow-up as outpatient stroke clinic in 6 weeks Delia Heady, MD To contact Stroke Continuity provider, please refer to [email or web address]. After hours, contact General Neurology

## 2020-04-08 ENCOUNTER — Ambulatory Visit: Payer: Medicare Other | Admitting: Cardiology

## 2020-04-22 DIAGNOSIS — E559 Vitamin D deficiency, unspecified: Secondary | ICD-10-CM | POA: Diagnosis not present

## 2020-04-22 DIAGNOSIS — R7309 Other abnormal glucose: Secondary | ICD-10-CM | POA: Diagnosis not present

## 2020-04-22 DIAGNOSIS — I10 Essential (primary) hypertension: Secondary | ICD-10-CM | POA: Diagnosis not present

## 2020-04-27 DIAGNOSIS — R7303 Prediabetes: Secondary | ICD-10-CM | POA: Diagnosis not present

## 2020-04-27 DIAGNOSIS — E559 Vitamin D deficiency, unspecified: Secondary | ICD-10-CM | POA: Diagnosis not present

## 2020-04-27 DIAGNOSIS — I10 Essential (primary) hypertension: Secondary | ICD-10-CM | POA: Diagnosis not present

## 2020-04-27 DIAGNOSIS — Z1212 Encounter for screening for malignant neoplasm of rectum: Secondary | ICD-10-CM | POA: Diagnosis not present

## 2020-04-27 DIAGNOSIS — Z0000 Encounter for general adult medical examination without abnormal findings: Secondary | ICD-10-CM | POA: Diagnosis not present

## 2020-05-05 ENCOUNTER — Ambulatory Visit (INDEPENDENT_AMBULATORY_CARE_PROVIDER_SITE_OTHER): Payer: Medicare Other | Admitting: Adult Health

## 2020-05-05 ENCOUNTER — Encounter: Payer: Self-pay | Admitting: Adult Health

## 2020-05-05 VITALS — BP 128/86 | HR 54 | Ht 68.0 in | Wt 274.0 lb

## 2020-05-05 DIAGNOSIS — E785 Hyperlipidemia, unspecified: Secondary | ICD-10-CM

## 2020-05-05 DIAGNOSIS — E119 Type 2 diabetes mellitus without complications: Secondary | ICD-10-CM | POA: Diagnosis not present

## 2020-05-05 DIAGNOSIS — I10 Essential (primary) hypertension: Secondary | ICD-10-CM

## 2020-05-05 DIAGNOSIS — I639 Cerebral infarction, unspecified: Secondary | ICD-10-CM

## 2020-05-05 MED ORDER — ATORVASTATIN CALCIUM 20 MG PO TABS: 20.0000 mg | ORAL_TABLET | Freq: Every day | ORAL | 3 refills | Status: DC

## 2020-05-05 MED ORDER — CLOPIDOGREL BISULFATE 75 MG PO TABS: 75.0000 mg | ORAL_TABLET | Freq: Every day | ORAL | 0 refills | Status: DC

## 2020-05-05 NOTE — Patient Instructions (Addendum)
Continue aspirin 81 mg daily restart Plavix for additional 2 months and then aspirin 81mg  daily alone and restart atorvastatin 20mg  daily for secondary stroke prevention  Please ensure you follow up with Dr. and discuss heart monitoring to assess for atrial fibrillation as potential cause of your stroke  Continue to follow up with PCP regarding cholesterol, blood pressure and diabetes management  Maintain strict control of hypertension with blood pressure goal below 130/90, diabetes with hemoglobin A1c goal below 7.0% and cholesterol with LDL cholesterol (bad cholesterol) goal below 70 mg/dL.      Followup in the future with me in 3 months or call earlier if needed       Thank you for coming to see at [facility]. I hope we have been able to provide you high quality care today.  You may receive a patient satisfaction survey over the next few weeks. We would appreciate your feedback and comments so that we may continue to improve ourselves and the health of our patients.     Ischemic Stroke  An ischemic stroke is the sudden death of brain tissue. Blood carries oxygen to all areas of the body. This type of stroke happens when your blood does not flow to your brain like normal. Your brain cannot get the oxygen it needs. This is an emergency. It must be treated right away. Symptoms of a stroke usually happen all of a sudden. You may notice them when you wake up. They can include:  Weakness or loss of feeling in your face, arm, or leg. This often happens on one side of the body.  Trouble walking.  Trouble moving your arms or legs.  Loss of balance or coordination.  Feeling confused.  Trouble talking or understanding what people are saying.  Slurred speech.  Trouble seeing.  Seeing two of one object (double vision).  Feeling dizzy.  Feeling sick to your stomach (nauseous) and throwing up (vomiting).  A very bad headache for no reason. Get help  as soon as any of these problems start. This is important. Some treatments work better if they are given right away. These include:  Aspirin.  Medicines to control blood pressure.  A shot (injection) of medicine to break up the blood clot.  Treatments given in the blood vessel (artery) to take out the clot or break it up. Other treatments may include:  Oxygen.  Fluids given through an IV tube.  Medicines to thin out your blood.  Procedures to help your blood flow better. What increases the risk? Certain things may make you more likely to have a stroke. Some of these are things that you can change, such as:  Being very overweight (obesity).  Smoking.  Taking birth control pills.  Not being active.  Drinking too much alcohol.  Using drugs. Other risk factors include:  High blood pressure.  High cholesterol.  Diabetes.  Heart disease.  Being [place] American, Native [facility], Hispanic, or [place] Native.  Being over age 90.  Family history of stroke.  Having had blood clots, stroke, or warning stroke (transient ischemic attack, TIA) in the past.  Sickle cell disease.  Being a woman with a history of high blood pressure in pregnancy (preeclampsia).  Migraine headache.  Sleep apnea.  Having an irregular heartbeat (atrial fibrillation).  Long-term (chronic) diseases that cause soreness and swelling (inflammation).  Disorders that affect how your blood clots. Follow these instructions at home: Medicines  Take over-the-counter and prescription medicines only as told  by your doctor.  If you were told to take aspirin or another medicine to thin your blood, take it exactly as told by your doctor. ? Taking too much of the medicine can cause bleeding. ? If you do not take enough, it may not work as well.  Know the side effects of your medicines. If you are taking a blood thinner, make sure you: ? Hold pressure over any cuts for longer than usual. ? Tell  your dentist and other doctors that you take this medicine. ? Avoid activities that may cause damage or injury to your body. Eating and drinking  Follow instructions from your doctor about what you cannot eat or drink.  Eat healthy foods.  If you have trouble with swallowing, do these things to avoid choking: ? Take small bites when eating. ? Eat foods that are soft or pureed. Safety  Follow instructions from your health care team about physical activity.  Use a walker or cane as told by your doctor.  Keep your home safe so you do not fall. This may include: ? Having experts look at your home to make sure it is safe. ? Putting grab bars in the bedroom and bathroom. ? Using raised toilets. ? Putting a seat in the shower. General instructions  Do not use any tobacco products. ? Examples of these are cigarettes, chewing tobacco, and e-cigarettes. ? If you need help quitting, ask your doctor.  Limit how much alcohol you drink. This means no more than 1 drink a day for nonpregnant women and 2 drinks a day for men. One drink equals 12 oz of beer, 5 oz of wine, or 1 oz of hard liquor.  If you need help to stop using drugs or alcohol, ask your doctor to refer you to a program or specialist.  Stay active. Exercise as told by your doctor.  Keep all follow-up visits as told by your doctor. This is important. Get help right away if:   You have any signs of a stroke. "BE FAST" is an easy way to remember the main warning signs: ? B - Balance. Signs are dizziness, sudden trouble walking, or loss of balance. ? E - Eyes. Signs are trouble seeing or a change in how you see. ? F - Face. Signs are sudden weakness or loss of feeling of the face, or the face or eyelid drooping on one side. ? A - Arms. Signs are weakness or loss of feeling in an arm. This happens suddenly and usually on one side of the body. ? S - Speech. Signs are sudden trouble speaking, slurred speech, or trouble  understanding what people say. ? T - Time. Time to call emergency services. Write down what time symptoms started.  You have other signs of a stroke, such as: ? A sudden, very bad headache with no known cause. ? Feeling sick to your stomach (nausea). ? Throwing up (vomiting). ? Jerky movements you cannot control (seizure). These symptoms may be an emergency. Do not wait to see if the symptoms will go away. Get medical help right away. Call your local emergency services (911 in the [place] Do not drive yourself to the hospital. Summary  An ischemic stroke is the sudden death of brain tissue.  Symptoms of a stroke usually happen all of a sudden. You may notice them when you wake up.  Get help if you have any warning signs of a stroke. This is important. Some treatments work better if they are given  right away. This information is not intended to replace advice given to you by your health care provider. Make sure you discuss any questions you have with your health care provider. Document Revised: 02/14/2018 Document Reviewed: 12/02/2015 Elsevier Patient Education  2020 [employer].

## 2020-05-05 NOTE — Progress Notes (Signed)
[facility] [address] [place]. [place] [address] [phone]       HOSPITAL FOLLOW UP NOTE  [name] Date of Birth:  Apr 25, 1946 Medical Record Number:  [id number]   Reason for Referral:  hospital stroke follow up    SUBJECTIVE:   CHIEF COMPLAINT:  Chief Complaint  Patient presents with  . Hospitalization Follow-up    HFU for stroke. States she has been well since being home. Slowly getting her energy back   . room 5    with son     HPI:   [name] is a 74 y.o. female with history of DM2, HTN, atrial tachycardia, hx of bradycardia with BB's, obesity and recent stress over the loss of a close friend who presented to the ED via EMS on 04/04/2020 for evaluation of AMS, HA, abnormal sounding speech, possible brief LOC, and transient bradycardia.  Stroke work-up revealed right MCA parietal and insular infarct in setting of proximal right M2 branch occlusion, embolic secondary to unknown source.  Recommended DAPT for 3 months then Plavix alone.  Advised to follow-up outpatient with 30-day cardiac event monitor or loop recorder to assess for atrial fibrillation. HTN stable.  LDL 87 and initiated atorvastatin 80 mg daily.  Controlled DM with A1c 6.2.  Other stroke risk factors include advanced age, obesity and family history of stroke.  Other active problems include bradycardia with brief LOC (BB held - hx of atrial tachycardia), and 3 mm subpleural left upper lobe nodule, indeterminate. Residual intermittent confusion and left-sided peripheral visual loss.  Evaluated by therapy and recommended outpatient PT/OT and discharged home in stable condition.  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  Resultant intermittent confusion and left-sided peripheral vision loss  Code Stroke CT Head - not ordered  CT head - Loss of gray-white differentiation geographic region of ill-defined  hypoattenuation in the right temporal lobe concerning for age-indeterminate infarct, possibly late acute to subacute. Chronic microvascular angiopathy and parenchymal volume loss.   MRI head -motion degraded but acute right MCA parietal and insular infarct  CTA H&N - Acute proximal right M2 branch occlusion, corresponding with previously identified evolving right MCA territory infarct.   2D Echo - EF 55 to 60%. No cardiac source of emboli identified.   [employer] Virus 2 - negative  LDL - 87  HgbA1c - 6.2  UDS - negative  VTE prophylaxis - Lovenox  aspirin 81 mg daily prior to admission, recommended DAPT for 3 months then Plavix alone  Patient counseled to be compliant with her antithrombotic medications  Ongoing aggressive stroke risk factor management  Therapy recommendations: Outpatient PT; no OT  Disposition: Home   Today, 05/05/2020, [name] is being seen for hospital follow-up accompanied by her son  Reports she has recovered well without residual deficits son does report occasional mild confusion but overall greatly improving Continues to live independently maintaining ADLs and majority of IADLs Denies new stroke/TIA symptoms  Currently on aspirin 81 mg daily but did not start Plavix or atorvastatin at discharge as she did not understand indication for medications. Per patient, follow-up with PCP recently who did not recommend use of atorvastatin due to satisfactory cholesterol levels Blood pressure today 128/86  Currently awaiting follow-up with Dr. Jacinto Halim to further discuss cardiac monitoring  No concerns at this time     ROS:   14 system review of systems performed and negative with exception of no complaints  PMH:  Past Medical History:  Diagnosis Date  . Controlled diabetes mellitus type II without complication (HCC) 11/23/2018  . Diabetes mellitus without complication (HCC)   . Essential hypertension 11/23/2018  . Obesity 11/23/2018    PSH: No past  surgical history on file.  Social History:  Social History   Socioeconomic History  . Marital status: Married    Spouse name: Not on file  . Number of children: 1  . Years of education: Not on file  . Highest education level: Not on file  Occupational History  . Not on file  Tobacco Use  . Smoking status: Never Smoker  . Smokeless tobacco: Never Used  Substance and Sexual Activity  . Alcohol use: No  . Drug use: No  . Sexual activity: Not on file  Other Topics Concern  . Not on file  Social History Narrative  . Not on file   Social Determinants of Health   Financial Resource Strain:   . Difficulty of Paying Living Expenses:   Food Insecurity:   . Worried About Programme researcher, broadcasting/film/video in the Last Year:   . Barista in the Last Year:   Transportation Needs:   . Freight forwarder (Medical):   Marland Kitchen Lack of Transportation (Non-Medical):   Physical Activity:   . Days of Exercise per Week:   . Minutes of Exercise per Session:   Stress:   . Feeling of Stress :   Social Connections:   . Frequency of Communication with Friends and Family:   . Frequency of Social Gatherings with Friends and Family:   . Attends Religious Services:   . Active Member of Clubs or Organizations:   . Attends Banker Meetings:   Marland Kitchen Marital Status:   Intimate Partner Violence:   . Fear of Current or Ex-Partner:   . Emotionally Abused:   Marland Kitchen Physically Abused:   . Sexually Abused:     Family History:  Family History  Problem Relation Age of Onset  . Stroke Mother   . Hypertension Mother     Medications:   Current Outpatient Medications on File Prior to Visit  Medication Sig Dispense Refill  . acetaminophen (TYLENOL) 500 MG tablet Take 500 mg by mouth daily as needed for mild pain or headache.    . cetirizine (ZYRTEC) 10 MG tablet Take 10 mg by mouth daily as needed for allergies.    . Cholecalciferol (VITAMIN D3) 5000 units CAPS Take 1 capsule by mouth daily.    .  cloNIDine (CATAPRES) 0.2 MG tablet TAKE 1 TABLET (0.2 MG TOTAL) BY MOUTH 2 (TWO) TIMES DAILY. (Patient taking differently: Take 0.2 mg by mouth daily. ) 180 tablet 2  . diphenhydrAMINE (BENADRYL) 12.5 MG/5ML liquid Take 12.5 mg by mouth at bedtime as needed for itching or allergies.    . valsartan-hydrochlorothiazide (DIOVAN-HCT) 320-12.5 MG tablet TAKE 1 TABLET BY MOUTH EVERY DAY (Patient taking differently: Take 1 tablet by mouth daily. ) 90 tablet 2   No current facility-administered medications on file prior to visit.    Allergies:   Allergies  Allergen Reactions  . Bystolic [Nebivolol Hcl]     Bradycardia   . Coreg [Carvedilol]     bradycardia  . Minoxidil     Abdominal pain      OBJECTIVE:  Physical Exam  Vitals:   05/05/20 1106  BP: 128/86  Pulse: (!) 54  Weight: 274 lb (124.3 kg)  Height: 5\' 8"  (1.727 m)  Body mass index is 41.66 kg/m. No exam data present  No flowsheet data found.   General: well developed, well nourished, pleasant elderly AA female, seated, in no evident distress Head: head normocephalic and atraumatic.   Neck: supple with no carotid or supraclavicular bruits Cardiovascular: regular rate and irregular rhythm (? PVC w/ known hx), no murmurs Musculoskeletal: no deformity Skin:  no rash/petichiae Vascular:  Normal pulses all extremities   Neurologic Exam Mental Status: Awake and fully alert.   Fluent speech and language.  Oriented to place and time. Recent and remote memory intact. Attention span, concentration and fund of knowledge appropriate during visit and appropriately questioned use of stroke prevention medications. Mood and affect appropriate.  Cranial Nerves: Fundoscopic exam reveals sharp disc margins. Pupils equal, briskly reactive to light. Extraocular movements full without nystagmus. Visual fields full to confrontation. Hearing intact. Facial sensation intact.  Mild left lower facial asymmetry.  Tongue, and palate moves normally  and symmetrically.  Motor: Normal bulk and tone. Normal strength in all tested extremity muscles. Sensory.: intact to touch , pinprick , position and vibratory sensation.  Coordination: Rapid alternating movements normal in all extremities. Finger-to-nose and heel-to-shin performed accurately bilaterally. Gait and Station: Arises from chair without difficulty. Stance is normal. Gait demonstrates  wide-based gait with normal stride length and balance without use of assistive device Reflexes: 1+ and symmetric. Toes downgoing.     NIHSS  0 Modified Rankin  1      ASSESSMENT: Ashley [name] is a 74 y.o. year old female presented with AMS, HA, abnormal sounding speech and brief LOC with bradycardia on 04/04/2020 with stroke work-up revealing right MCA stroke in setting of proximal right M2 branch occlusion, infarct embolic secondary to unknown source. Vascular risk factors include HTN, HLD, DM, obesity and family history of stroke.      PLAN:  1. Right MCA stroke, cryptogenic:  a. Residual deficit: Occasional mild confusion with ongoing improvement.  b. Ensure follow-up scheduled with Dr. Jacinto Halim for further cardiac evaluation with either cardiac monitor or loop recorder to rule out atrial fibrillation as possible stroke etiology c. Continue aspirin 81 mg daily and start clopidogrel for total of 3 months (did not start Plavix at discharge) and then aspirin alone (per patient, limited use of aspirin PTA) and start atorvastatin 20 mg daily for secondary stroke prevention.  Discussed in length indication and possible side effects of above medications and advised to call office or PCP with any questions or concerns d. Close PCP follow up for aggressive stroke risk factor management  2. HTN: BP goal <130/90.  Stable.  Continue f/u with PCP 3. HLD: LDL goal <70. Recent LDL 87.  As LDL above goal, recommend starting atorvastatin 20 mg daily as well as for secondary stroke prevention.  Lifelong  use of statin recommended to ensure aggressive stroke risk factor management.  4. DMII: A1c goal<7.0. Recent A1c 6.2. F/u with PCP    Follow up in 3 months or call earlier if needed   I spent 45 minutes of face-to-face and non-face-to-face time with patient and son.  This included previsit chart review, lab review, study review, order entry, electronic health record documentation, patient education regarding recent stroke, residual deficits, importance of managing stroke risk factors as well as indication for above medications and answered all questions to patient and sons satisfaction     Ihor Austin, [facility]  [facility] [address] [place], [place] [address]  Phone [phone] Fax [phone]  Note: This document was prepared with digital dictation and possible smart phrase technology. Any transcriptional errors that result from this process are unintentional.

## 2020-05-08 NOTE — Progress Notes (Signed)
I agree with the above plan 

## 2020-05-26 DIAGNOSIS — E119 Type 2 diabetes mellitus without complications: Secondary | ICD-10-CM | POA: Diagnosis not present

## 2020-05-26 DIAGNOSIS — Z0100 Encounter for examination of eyes and vision without abnormal findings: Secondary | ICD-10-CM | POA: Diagnosis not present

## 2020-05-27 ENCOUNTER — Other Ambulatory Visit: Payer: Self-pay | Admitting: Adult Health

## 2020-08-10 ENCOUNTER — Encounter: Payer: Self-pay | Admitting: Adult Health

## 2020-08-10 ENCOUNTER — Other Ambulatory Visit: Payer: Self-pay

## 2020-08-10 ENCOUNTER — Ambulatory Visit (INDEPENDENT_AMBULATORY_CARE_PROVIDER_SITE_OTHER): Payer: Medicare Other | Admitting: Adult Health

## 2020-08-10 VITALS — BP 166/84 | HR 90 | Ht 68.0 in | Wt 266.0 lb

## 2020-08-10 DIAGNOSIS — E785 Hyperlipidemia, unspecified: Secondary | ICD-10-CM

## 2020-08-10 DIAGNOSIS — I639 Cerebral infarction, unspecified: Secondary | ICD-10-CM | POA: Diagnosis not present

## 2020-08-10 DIAGNOSIS — I10 Essential (primary) hypertension: Secondary | ICD-10-CM

## 2020-08-10 NOTE — Progress Notes (Signed)
[facility] [address] [place]. [place] [address] [phone]       STROKE FOLLOW UP NOTE  Ashley [name] Date of Birth:  October 07, 1945 Medical Record Number:  [id number]   Reason for Referral: stroke follow up    SUBJECTIVE:   CHIEF COMPLAINT:  Chief Complaint  Patient presents with  . Follow-up    rm 9, stroke fu with granddaughter, pt states she is doing well     HPI:   Today, 08/10/2020, Ashley [name] returns for 12-month stroke follow-up accompanied by her granddaughter.  She has been doing well since prior visit without residual deficits and denies new stroke/TIA symptoms.  Remains on aspirin and atorvastatin for secondary stroke prevention without side effects.  Blood pressure today initially elevated and on recheck 166/84.  She intermittently monitors at home which has been stable.  She has follow up visit with Dr. Jacinto Halim and PCP next week. She does report some swelling in BLE.  No further concerns at this time.    History provided for reference purposes only Initial visit 05/05/2020 JM: Ms. Enge is being seen for hospital follow-up accompanied by her son Reports she has recovered well without residual deficits son does report occasional mild confusion but overall greatly improving Continues to live independently maintaining ADLs and majority of IADLs Denies new stroke/TIA symptoms Currently on aspirin 81 mg daily but did not start Plavix or atorvastatin at discharge as she did not understand indication for medications. Per patient, follow-up with PCP recently who did not recommend use of atorvastatin due to satisfactory cholesterol levels Blood pressure today 128/86 Currently awaiting follow-up with Dr. Jacinto Halim to further discuss cardiac monitoring No concerns at this time  Stroke admission 04/04/2020 [name] is a 74 y.o. female with history of DM2, HTN, atrial tachycardia, hx of bradycardia with BB's, obesity and recent  stress over the loss of a close friend who presented to the ED via EMS on 04/04/2020 for evaluation of AMS, HA, abnormal sounding speech, possible brief LOC, and transient bradycardia.  Stroke work-up revealed right MCA parietal and insular infarct in setting of proximal right M2 branch occlusion, embolic secondary to unknown source.  Recommended DAPT for 3 months then Plavix alone.  Advised to follow-up outpatient with 30-day cardiac event monitor or loop recorder to assess for atrial fibrillation. HTN stable.  LDL 87 and initiated atorvastatin 80 mg daily.  Controlled DM with A1c 6.2.  Other stroke risk factors include advanced age, obesity and family history of stroke.  Other active problems include bradycardia with brief LOC (BB held - hx of atrial tachycardia), and 3 mm subpleural left upper lobe nodule, indeterminate. Residual intermittent confusion and left-sided peripheral visual loss.  Evaluated by therapy and recommended outpatient PT/OT and discharged home in stable condition.  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  Resultant intermittent confusion and left-sided peripheral vision loss  Code Stroke CT Head - not ordered  CT head - Loss of gray-white differentiation geographic region of ill-defined hypoattenuation in the right temporal lobe concerning for age-indeterminate infarct, possibly late acute to subacute. Chronic microvascular angiopathy and parenchymal volume loss.   MRI head -motion degraded but acute right MCA parietal and insular infarct  CTA H&N - Acute proximal right M2 branch occlusion, corresponding with previously identified evolving right MCA territory infarct.   2D Echo - EF 55 to 60%. No cardiac source of emboli identified.   [employer] Virus 2 -  negative  LDL - 87  HgbA1c - 6.2  UDS - negative  VTE prophylaxis - Lovenox  aspirin 81 mg daily prior to admission, recommended DAPT for 3 months then Plavix  alone  Patient counseled to be compliant with her antithrombotic medications  Ongoing aggressive stroke risk factor management  Therapy recommendations: Outpatient PT; no OT  Disposition: Home       ROS:   14 system review of systems performed and negative with exception of swelling  PMH:  Past Medical History:  Diagnosis Date  . Controlled diabetes mellitus type II without complication (HCC) 11/23/2018  . Diabetes mellitus without complication (HCC)   . Essential hypertension 11/23/2018  . Obesity 11/23/2018    PSH: History reviewed. No pertinent surgical history.  Social History:  Social History   Socioeconomic History  . Marital status: Married    Spouse name: Not on file  . Number of children: 1  . Years of education: Not on file  . Highest education level: Not on file  Occupational History  . Not on file  Tobacco Use  . Smoking status: Never Smoker  . Smokeless tobacco: Never Used  Substance and Sexual Activity  . Alcohol use: No  . Drug use: No  . Sexual activity: Not on file  Other Topics Concern  . Not on file  Social History Narrative  . Not on file   Social Determinants of Health   Financial Resource Strain:   . Difficulty of Paying Living Expenses: Not on file  Food Insecurity:   . Worried About Programme researcher, broadcasting/film/video in the Last Year: Not on file  . Ran Out of Food in the Last Year: Not on file  Transportation Needs:   . Lack of Transportation (Medical): Not on file  . Lack of Transportation (Non-Medical): Not on file  Physical Activity:   . Days of Exercise per Week: Not on file  . Minutes of Exercise per Session: Not on file  Stress:   . Feeling of Stress : Not on file  Social Connections:   . Frequency of Communication with Friends and Family: Not on file  . Frequency of Social Gatherings with Friends and Family: Not on file  . Attends Religious Services: Not on file  . Active Member of Clubs or Organizations: Not on file  . Attends Occupational hygienist Meetings: Not on file  . Marital Status: Not on file  Intimate Partner Violence:   . Fear of Current or Ex-Partner: Not on file  . Emotionally Abused: Not on file  . Physically Abused: Not on file  . Sexually Abused: Not on file    Family History:  Family History  Problem Relation Age of Onset  . Stroke Mother   . Hypertension Mother     Medications:   Current Outpatient Medications on File Prior to Visit  Medication Sig Dispense Refill  . acetaminophen (TYLENOL) 500 MG tablet Take 500 mg by mouth daily as needed for mild pain or headache.    . ASPIRIN 81 PO SMARTSIG:1 Tablet(s) By Mouth Daily    . atorvastatin (LIPITOR) 20 MG tablet Take 1 tablet (20 mg total) by mouth daily at 6 PM. 90 tablet 3  . cetirizine (ZYRTEC) 10 MG tablet Take 10 mg by mouth daily as needed for allergies.    . Cholecalciferol (VITAMIN D3) 5000 units CAPS Take 1 capsule by mouth daily.    . cloNIDine (CATAPRES) 0.2 MG tablet TAKE 1 TABLET (0.2 MG TOTAL) BY  MOUTH 2 (TWO) TIMES DAILY. (Patient taking differently: Take 0.2 mg by mouth daily. ) 180 tablet 2  . valsartan-hydrochlorothiazide (DIOVAN-HCT) 320-12.5 MG tablet TAKE 1 TABLET BY MOUTH EVERY DAY (Patient taking differently: Take 1 tablet by mouth daily. ) 90 tablet 2   No current facility-administered medications on file prior to visit.    Allergies:   Allergies  Allergen Reactions  . Bystolic [Nebivolol Hcl]     Bradycardia   . Coreg [Carvedilol]     bradycardia  . Minoxidil     Abdominal pain      OBJECTIVE:  Physical Exam  Vitals:   08/10/20 1027  BP: (!) 166/84  Pulse: 90  Weight: 266 lb (120.7 kg)  Height: 5\' 8"  (1.727 m)   Body mass index is 40.45 kg/m. No exam data present  General: well developed, well nourished, pleasant elderly AA female, seated, in no evident distress Head: head normocephalic and atraumatic.   Neck: supple with no carotid or supraclavicular bruits Cardiovascular: regular rate and  regular rhythm, no murmurs; trace edema BLE distal  Musculoskeletal: no deformity Skin:  no rash/petichiae Vascular:  Normal pulses all extremities   Neurologic Exam Mental Status: Awake and fully alert.   Fluent speech and language.  Oriented to place and time. Recent and remote memory intact. Attention span, concentration and fund of knowledge appropriate during visit and appropriately questioned use of stroke prevention medications. Mood and affect appropriate.  Cranial Nerves: Pupils equal, briskly reactive to light. Extraocular movements full without nystagmus. Visual fields full to confrontation. Hearing intact. Facial sensation intact.  Face, tongue, and palate moves normally and symmetrically.  Motor: Normal bulk and tone. Normal strength in all tested extremity muscles. Sensory.: intact to touch , pinprick , position and vibratory sensation.  Coordination: Rapid alternating movements normal in all extremities. Finger-to-nose and heel-to-shin performed accurately bilaterally. Gait and Station: Arises from chair without difficulty. Stance is normal. Gait demonstrates  wide-based gait with normal stride length and balance without use of assistive device Reflexes: 1+ and symmetric. Toes downgoing.      ASSESSMENT: Ashley [name] is a 74 y.o. year old female presented with AMS, HA, abnormal sounding speech and brief LOC with bradycardia on 04/04/2020 with stroke work-up revealing right MCA stroke in setting of proximal right M2 branch occlusion, infarct embolic secondary to unknown source. Vascular risk factors include HTN, HLD, DM, obesity and family history of stroke.      PLAN:  1. Right MCA stroke, cryptogenic:  a. Recovered well without residual deficit b. Encouraged patient to follow-up with Dr. 04/06/2020 in regards to further evaluation of recent embolic stroke such as with cardiac monitor or loop recorder to assess for atrial fibrillation.  She request speaking to Dr. Jacinto Halim in  regards to further evaluation -she does not [name] further evaluation by a different office or cardiologist.  Also advised to further discuss with PCP or Dr. Jacinto Halim regarding trace BLE edema -advised to decrease/avoid sodium intake, use of compression stockings and elevation of legs.  Unable to verify scheduled visits with either PCP or Dr. Jacinto Halim and advised patient to contact office to ensure visits are scheduled c. Continue aspirin 81 mg daily and atorvastatin 20 mg daily for secondary stroke prevention.  d. Close PCP follow up for aggressive stroke risk factor management  2. HTN: BP goal <130/90.  Elevated today but stable at home per patient on valsartan-hydrochlorothiazide and clonidine per PCP 3. HLD: LDL goal <70. Recent LDL 87.  Continue on  atorvastatin 20 mg daily    Overall stable from stroke standpoint and recommend follow-up on an as-needed basis  CC:  [facility] provider: Dr. Henrietta HooverSethi Pharr, Zollie BeckersWalter, MD    I spent 30 minutes of face-to-face and non-face-to-face time with patient and granddaughter.  This included previsit chart review, lab review, study review, order entry, electronic health record documentation, patient education regarding stroke, indication for further evaluation, importance of managing stroke risk factors and answered all questions to patient and granddaughters satisfaction    Ihor AustinJessica McCue, AGNP-BC  [facility] [address] [place], [place] [id number]  Phone [phone] Fax [phone] Note: This document was prepared with digital dictation and possible smart phrase technology. Any transcriptional errors that result from this process are unintentional.

## 2020-08-10 NOTE — Patient Instructions (Addendum)
Please follow up with Dr. Jacinto Halim regarding possible need of heart monitor due to prior stroke of unknown cause. Also further discuss lower extremity swelling. Decreasing sodium intake, use of compression stocking and elevation should help with the swelling  Continue aspirin 81 mg daily  and Crestor  for secondary stroke prevention  Continue to follow up with PCP regarding cholesterol and blood pressure management  Maintain strict control of hypertension with blood pressure goal below 130/90 and cholesterol with LDL cholesterol (bad cholesterol) goal below 70 mg/dL.        Thank you for coming to see [place] at [facility]. I hope we have been able to provide you high quality care today.  You may receive a patient satisfaction survey over the next few weeks. We would appreciate your feedback and comments so that we may continue to improve ourselves and the health of our patients.

## 2020-08-11 NOTE — Progress Notes (Signed)
I agree with the above plan 

## 2020-08-14 ENCOUNTER — Telehealth: Payer: Self-pay | Admitting: Cardiology

## 2020-08-18 NOTE — Telephone Encounter (Signed)
Scheduled 08/24/20

## 2020-08-24 ENCOUNTER — Encounter: Payer: Self-pay | Admitting: Student

## 2020-08-24 ENCOUNTER — Ambulatory Visit: Payer: Medicare Other | Admitting: Student

## 2020-08-24 ENCOUNTER — Other Ambulatory Visit: Payer: Self-pay

## 2020-08-24 VITALS — BP 144/84 | HR 61 | Ht 68.0 in | Wt 264.0 lb

## 2020-08-24 DIAGNOSIS — Z8673 Personal history of transient ischemic attack (TIA), and cerebral infarction without residual deficits: Secondary | ICD-10-CM

## 2020-08-24 DIAGNOSIS — I471 Supraventricular tachycardia: Secondary | ICD-10-CM | POA: Diagnosis not present

## 2020-08-24 DIAGNOSIS — Z6841 Body Mass Index (BMI) 40.0 and over, adult: Secondary | ICD-10-CM

## 2020-08-24 DIAGNOSIS — I10 Essential (primary) hypertension: Secondary | ICD-10-CM

## 2020-08-24 MED ORDER — HYDROCHLOROTHIAZIDE 12.5 MG PO CAPS
12.5000 mg | ORAL_CAPSULE | Freq: Every day | ORAL | 3 refills | Status: DC
Start: 2020-08-24 — End: 2021-06-27

## 2020-08-24 MED ORDER — SPIRONOLACTONE 25 MG PO TABS: 25.0000 mg | ORAL_TABLET | Freq: Every day | ORAL | 3 refills | Status: DC

## 2020-08-24 MED ORDER — ROSUVASTATIN CALCIUM 5 MG PO TABS: 5.0000 mg | ORAL_TABLET | Freq: Every day | ORAL | 2 refills | Status: DC

## 2020-08-24 NOTE — Progress Notes (Signed)
Primary Physician/Referring:  Deland Pretty, MD  Patient ID: Ashley [name], female    DOB: 29-Dec-1945, 74 y.o.   MRN: [id number]  Chief Complaint  Patient presents with  . Cerebrovascular Accident  . Follow-up   HPI:    Ashley [name]  is a 74 y.o. female  with  morbid obesity, hypertension, chronic dyspnea on exertion, abnormal EKG with a negative nuclear stress test in 2016. 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  Patient presents for hospital follow. She presented to [facility] emergency department 04/04/2020 with complaint of confusion and found to be bradycardic however she was seen by Dr. Virgina Jock who felt presentation could not be explained by bradycardia. Further evaluation revealed subacute right temporal lobe ischemic infarction.   Patient continues to follow with neurology, and although she reports she is anxious regarding her health she has been feeling good and recuperated well following her stroke.  Patient denies chest pain, palpitations, dyspnea, dizziness, syncope, near syncope, symptoms suggestive of TIA/CVA.  She is slowly been increasing her activity, doing daily exercises around the house.  Presently she averages about 5000 steps per day, however previously she was averaging 10,000 steps per day.  Patient denies any history of sleep apnea, does not snore.  At patient's last visit with neurology her provider stopped atorvastatin.  Patient's home blood pressure readings 140s/70s.   Past Medical History:  Diagnosis Date  . Controlled diabetes mellitus type II without complication ([place]) 03/27/3902  . Diabetes mellitus without complication ([place])   . Essential hypertension 11/23/2018  . Obesity 11/23/2018  . Stroke [facility])    History reviewed. No pertinent surgical history. Family History  Problem Relation Age of Onset  . Stroke Mother   . Hypertension Mother     Social History   Tobacco Use  . Smoking status: Never  Smoker  . Smokeless tobacco: Never Used  Substance Use Topics  . Alcohol use: No   Marital Status: Married   ROS  Review of Systems  Constitutional: Negative for malaise/fatigue and weight gain.  Cardiovascular: Negative for chest pain, claudication, leg swelling, near-syncope, orthopnea, palpitations, paroxysmal nocturnal dyspnea and syncope.  Respiratory: Negative for shortness of breath.   Hematologic/Lymphatic: Does not bruise/bleed easily.  Gastrointestinal: Negative for melena.  Neurological: Negative for dizziness and weakness.    Objective  Blood pressure (!) 144/84, pulse 61, height '5\' 8"'  (1.727 m), weight 264 lb (119.7 kg), SpO2 97 %.  Vitals with BMI 08/24/2020 08/10/2020 05/05/2020  Height '5\' 8"'  '5\' 8"'  '5\' 8"'   Weight 264 lbs 266 lbs 274 lbs  BMI 40.15 00.92 33.00  Systolic 762 263 335  Diastolic 84 84 86  Pulse 61 90 54      Physical Exam Vitals reviewed.  Constitutional:      Appearance: She is obese.  HENT:     Head: Normocephalic and atraumatic.  Cardiovascular:     Rate and Rhythm: Normal rate and regular rhythm. Frequent extrasystoles are present.    Pulses: Intact distal pulses.          Carotid pulses are 2+ on the right side and 2+ on the left side.      Radial pulses are 2+ on the right side and 2+ on the left side.       Dorsalis pedis pulses are 2+ on the right side and 2+ on the left side.       Posterior tibial pulses are 2+ on the right side  and 2+ on the left side.     Heart sounds: S1 normal and S2 normal. No murmur heard.  No gallop.      Comments: Femoral and popliteal pulses difficult to evaluate due to patient's body habitus.  Pulmonary:     Effort: Pulmonary effort is normal. No respiratory distress.     Breath sounds: No wheezing, rhonchi or rales.  Musculoskeletal:     Right lower leg: No edema.     Left lower leg: No edema.  Neurological:     Mental Status: She is alert.     Laboratory examination:   Recent Labs     04/04/20 1925 04/04/20 1925 04/04/20 1946 04/05/20 0334 04/06/20 1035  NA 140   < > 141 140 137  K 3.8   < > 3.8 3.5 3.2*  CL 105   < > 101 102 100  CO2 25  --   --  26 26  GLUCOSE 117*   < > 115* 124* 156*  BUN 6*   < > 9 6* 11  CREATININE 0.87   < > 0.80 0.85 1.03*  CALCIUM 9.1  --   --  9.3 9.6  GFRNONAA >60  --   --  >60 54*  GFRAA >60  --   --  >60 >60   < > = values in this interval not displayed.   CrCl cannot be calculated (Patient's most recent lab result is older than the maximum 21 days allowed.).  CMP Latest Ref Rng & Units 04/06/2020 04/05/2020 04/04/2020  Glucose 70 - 99 mg/dL 156(H) 124(H) 115(H)  BUN 8 - 23 mg/dL 11 6(L) 9  Creatinine 0.44 - 1.00 mg/dL 1.03(H) 0.85 0.80  Sodium 135 - 145 mmol/L 137 140 141  Potassium 3.5 - 5.1 mmol/L 3.2(L) 3.5 3.8  Chloride 98 - 111 mmol/L 100 102 101  CO2 22 - 32 mmol/L 26 26 -  Calcium 8.9 - 10.3 mg/dL 9.6 9.3 -  Total Protein 6.5 - 8.1 g/dL 7.1 7.0 -  Total Bilirubin 0.3 - 1.2 mg/dL 1.1 0.7 -  Alkaline Phos 38 - 126 U/L 64 61 -  AST 15 - 41 U/L 53(H) 64(H) -  ALT 0 - 44 U/L 38 43 -   CBC Latest Ref Rng & Units 04/06/2020 04/05/2020 04/04/2020  WBC 4.0 - 10.5 K/uL 9.0 8.9 -  Hemoglobin 12.0 - 15.0 g/dL 13.3 12.8 15.0  Hematocrit 36 - 46 % 42.4 40.1 44.0  Platelets 150 - 400 K/uL 138(L) 133(L) -    Lipid Panel Recent Labs    04/05/20 0334  CHOL 163  TRIG 64  LDLCALC 87  VLDL 13  HDL 63  CHOLHDL 2.6    HEMOGLOBIN A1C Lab Results  Component Value Date   HGBA1C 6.2 (H) 04/04/2020   MPG 131.24 04/04/2020   TSH Recent Labs    04/04/20 1925  TSH 2.452    External labs:   10/15/2019:  Hb 13.1/HCT 40.0, platelets 182. Serum glucose 104 mg, BUN 10, creatinine 0.91, EGFR >60 mL.  Potassium 4.6.  CMP normal. Total cholesterol 162, triglycerides 81, HDL 70, LDL 77.  Vitamin D 49.5. A1c 6.3%.  Medications and allergies   Allergies  Allergen Reactions  . Bystolic [Nebivolol Hcl]     Bradycardia   . Coreg  [Carvedilol]     bradycardia  . Minoxidil     Abdominal pain     Outpatient Medications Prior to Visit  Medication Sig Dispense Refill  . acetaminophen (  TYLENOL) 500 MG tablet Take 500 mg by mouth daily as needed for mild pain or headache.    . ASPIRIN 81 PO SMARTSIG:1 Tablet(s) By Mouth Daily    . cetirizine (ZYRTEC) 10 MG tablet Take 10 mg by mouth daily as needed for allergies.    . Cholecalciferol (VITAMIN D3) 5000 units CAPS Take 1 capsule by mouth daily.    . cloNIDine (CATAPRES) 0.2 MG tablet TAKE 1 TABLET (0.2 MG TOTAL) BY MOUTH 2 (TWO) TIMES DAILY. (Patient taking differently: Take 0.2 mg by mouth daily. ) 180 tablet 2  . valsartan-hydrochlorothiazide (DIOVAN-HCT) 320-12.5 MG tablet TAKE 1 TABLET BY MOUTH EVERY DAY (Patient taking differently: Take 1 tablet by mouth daily. ) 90 tablet 2  . atorvastatin (LIPITOR) 20 MG tablet Take 1 tablet (20 mg total) by mouth daily at 6 PM. (Patient not taking: Reported on 08/24/2020) 90 tablet 3   No facility-administered medications prior to visit.     Radiology:   No results found.  CT ANGIO NECK W OR WO CONTRAST 04/05/2020 1. 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  2. Otherwise negative CTA of the head and neck. No other large vessel occlusion, hemodynamically significant stenosis, or other acute vascular abnormality.  3. 3 mm subpleural left upper lobe nodule, indeterminate. No follow-up needed if patient is low-risk. Non-contrast chest CT can be considered in 12 months if patient is high-risk. This recommendation follows the consensus statement: Guidelines for Management of Incidental Pulmonary Nodules Detected on CT Images: From the Fleischner Society 2017; Radiology 2017; 284:228-243.  CT HEAD WO CONTRAST 04/04/2020 1. Loss of gray-white differentiation geographic region of ill-defined hypoattenuation in the right temporal lobe concerning for  age-indeterminate infarct, possibly late acute to subacute.  2. No other acute intracranial abnormality.  3. Chronic microvascular angiopathy and parenchymal volume loss.  4. Extensive periapical lucencies of the maxillary dentition including a left maxillary molar possibly suggesting an odontogenic origin of left maxillary sinus disease. Correlate with dental exam.   DG Chest Port 1 View 04/04/2020 No active disease.  Cardiac Studies:   ECHOCARDIOGRAM COMPLETE BUBBLE STUDY 04/05/2020  1. Left ventricular ejection fraction, by estimation, is 55 to 60%. The left ventricle has normal function. The left ventricle has no regional wall motion abnormalities. There is mild left ventricular hypertrophy. Left ventricular diastolic parameters are indeterminate.   2. Right ventricular systolic function is normal. The right ventricular size is normal.   3. Left atrial size was moderately dilated.   4. Nosignificant valvular abnormality.   5. Agitated saline contrast bubble study was negative, with no evidence of any interatrial shunt.   Echocardiogram 07/16/2018:  Left ventricle cavity is normal in size. Moderate concentric hypertrophy of the left ventricle. Normal global wall motion. Doppler evidence of grade II (pseudonormal) diastolic dysfunction. Diastolic dysfunction findings suggests elevated LA/LV end diastolic pressure. Calculated EF 55%. Left atrial cavity is severely dilated in apical views, measures 4.7 cm in ling axis views. Mild aortic valve leaflet thickening. Normal aortic valve leaflet mobility. Trace aortic regurgitation. Mild prolapse of the mitral valve leaflets. No significant myxomatous degeneration noted. Mild to moderate mitral regurgitation. Trace tricuspid regurgitation. Unable to estimate PA pressure due to absence/minimal TR signal. Compared to the study done on 05/14/2015, no significant change.  Lexiscan Myoview nuclear stress test 03/02/2015: 1. The resting electrocardiogram  demonstrated normal sinus rhythm, IVCD, LVH, no resting arrhythmias and nonspecific ST-T changes. Stress EKG is non-diagnostic for ischemia as  it a pharmacologic stress using Lexiscan. Occasional PVC and atrial couplets and PAC. 2. Myocardial perfusion imaging is normal. Overall left ventricular systolic function was normal without regional wall motion abnormalities. The left ventricular ejection fraction was 54%.  EKG:   08/24/2020: Underlying sinus rhythm with premature atrial complexes at rate of 60 bpm.  Normal axis.  LVH with repolarization abnormality. Lateral T wave inversions, cannot exclude ischemia.   04/05/2020: sinus rhythm sinus bradycardia, rate 30-60 bpm, with frequent PACs. Inferolateral T wave inversions, not new.  04/08/19/20: Normal sinus rhythm at the rate of 60 bpm, frequent PACs, 6 beat run of atrial tachycardia, LVH with repolarization abnormality.  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 No significant change from  EKG 11/23/2018. No significant change compared to 08/08/2018.  PACs more frequent.  Assessment     ICD-10-CM   1. History of CVA (cerebrovascular accident) without residual deficits  T62.26 Basic metabolic panel    EKG 33-HLKT    rosuvastatin (CRESTOR) 5 MG tablet  2. Essential hypertension  I10 spironolactone (ALDACTONE) 25 MG tablet  3. Atrial tachycardia (HCC)  I47.1   4. Class 3 severe obesity due to excess calories without serious comorbidity with body mass index (BMI) of 40.0 to 44.9 in adult [facility])  E66.01    Z68.41      Medications Discontinued During This Encounter  Medication Reason  . atorvastatin (LIPITOR) 20 MG tablet Discontinued by provider    Meds ordered this encounter  Medications  . hydrochlorothiazide (MICROZIDE) 12.5 MG capsule    Sig: Take 1 capsule (12.5 mg total) by mouth daily.    Dispense:  90 capsule    Refill:  3  . spironolactone (ALDACTONE) 25 MG tablet    Sig: Take 1 tablet (25 mg total) by mouth daily.    Dispense:  30  tablet    Refill:  3  . rosuvastatin (CRESTOR) 5 MG tablet    Sig: Take 1 tablet (5 mg total) by mouth daily.    Dispense:  30 tablet    Refill:  2    Recommendations:   Ashley [name] is a 74 y.o. female  with  morbid obesity, hypertension, chronic dyspnea on exertion, abnormal EKG with a negative nuclear stress test in 2016. 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  Patient presents for follow-up of CVA diagnosed during hospitalization in 03/2020.  Reviewed and discussed with patient that she has undergone thorough evaluation for etiology of stroke including CT angio of the head and neck, CT of the head, echocardiogram with bubble study.  However no underlying etiology has been identified.  In view of negative work-up for underlying etiology as well as patient's risk factors for atrial fibrillation including obesity, hypertension, and frequent PACs noted on EKG today, will recommend implantation of loop recorder at this time.  Discussed with patient and her son regarding the risks, benefits, and indications of loop recorder implantation, particularly further evaluation of underlying atrial fibrillation as etiology of recent CVA.  Patient and her son both verbalized understanding and agreement to proceed with procedure.   Patient's blood pressure is elevated above goal.  Would prefer blood pressure to be closer to 625/63, certainly <893/73.  Recommend aggressive blood pressure management.  We will add additional 12.5 mg of HCT daily in addition to present combination of valsartan/HCT for a total of 25 mg HCTZ daily.  We will also add spironolactone 25 mg daily.  We will repeat BMP in 2 weeks.  Patient's  LDL was 87 at last lipid profile test.  Patient had previously stopped atorvastatin due to mild myalgias.  However with patient's history of stroke recommend statin therapy as secondary stroke prevention.  Will start rosuvastatin 5 mg daily, recheck lipid  profile testing.  Patient was seen in collaboration with Dr. Einar Gip. He also reviewed patient's chart and examined the patient. Dr. Einar Gip is in agreement of the plan.   During this visit I reviewed and updated: Tobacco history  allergies medication reconciliation  medical history  surgical history  family history  social history.   This was a 45-minute encounter with face-to-face counseling, medical records review, coordination of care, explanation of complex medical issues, complex medical decision making, explanation and discussion of loop implantation procedure.     Alethia Berthold, PA-C 08/24/2020, 3:28 PM Office: [phone]

## 2020-08-29 ENCOUNTER — Other Ambulatory Visit: Payer: Self-pay | Admitting: Cardiology

## 2020-08-29 DIAGNOSIS — I10 Essential (primary) hypertension: Secondary | ICD-10-CM

## 2020-09-18 ENCOUNTER — Other Ambulatory Visit: Payer: Self-pay | Admitting: Cardiology

## 2020-10-29 DIAGNOSIS — R7303 Prediabetes: Secondary | ICD-10-CM | POA: Diagnosis not present

## 2020-10-29 DIAGNOSIS — E559 Vitamin D deficiency, unspecified: Secondary | ICD-10-CM | POA: Diagnosis not present

## 2020-11-04 DIAGNOSIS — I10 Essential (primary) hypertension: Secondary | ICD-10-CM | POA: Diagnosis not present

## 2020-11-04 DIAGNOSIS — E559 Vitamin D deficiency, unspecified: Secondary | ICD-10-CM | POA: Diagnosis not present

## 2020-11-04 DIAGNOSIS — R7303 Prediabetes: Secondary | ICD-10-CM | POA: Diagnosis not present

## 2020-11-19 ENCOUNTER — Other Ambulatory Visit: Payer: Self-pay | Admitting: Student

## 2020-11-19 DIAGNOSIS — Z8673 Personal history of transient ischemic attack (TIA), and cerebral infarction without residual deficits: Secondary | ICD-10-CM

## 2020-12-19 ENCOUNTER — Other Ambulatory Visit: Payer: Self-pay | Admitting: Student

## 2020-12-19 DIAGNOSIS — I10 Essential (primary) hypertension: Secondary | ICD-10-CM

## 2021-04-26 DIAGNOSIS — E559 Vitamin D deficiency, unspecified: Secondary | ICD-10-CM | POA: Diagnosis not present

## 2021-04-26 DIAGNOSIS — I10 Essential (primary) hypertension: Secondary | ICD-10-CM | POA: Diagnosis not present

## 2021-04-26 DIAGNOSIS — R7303 Prediabetes: Secondary | ICD-10-CM | POA: Diagnosis not present

## 2021-04-26 DIAGNOSIS — Z0000 Encounter for general adult medical examination without abnormal findings: Secondary | ICD-10-CM | POA: Diagnosis not present

## 2021-04-26 DIAGNOSIS — E7800 Pure hypercholesterolemia, unspecified: Secondary | ICD-10-CM | POA: Diagnosis not present

## 2021-04-29 DIAGNOSIS — Z0000 Encounter for general adult medical examination without abnormal findings: Secondary | ICD-10-CM | POA: Diagnosis not present

## 2021-04-29 DIAGNOSIS — E559 Vitamin D deficiency, unspecified: Secondary | ICD-10-CM | POA: Diagnosis not present

## 2021-04-29 DIAGNOSIS — Z8673 Personal history of transient ischemic attack (TIA), and cerebral infarction without residual deficits: Secondary | ICD-10-CM | POA: Diagnosis not present

## 2021-04-29 DIAGNOSIS — E119 Type 2 diabetes mellitus without complications: Secondary | ICD-10-CM | POA: Diagnosis not present

## 2021-04-29 DIAGNOSIS — I10 Essential (primary) hypertension: Secondary | ICD-10-CM | POA: Diagnosis not present

## 2021-06-19 ENCOUNTER — Emergency Department (HOSPITAL_BASED_OUTPATIENT_CLINIC_OR_DEPARTMENT_OTHER): Payer: Medicare Other

## 2021-06-19 ENCOUNTER — Encounter (HOSPITAL_BASED_OUTPATIENT_CLINIC_OR_DEPARTMENT_OTHER): Payer: Self-pay | Admitting: Emergency Medicine

## 2021-06-19 ENCOUNTER — Inpatient Hospital Stay (HOSPITAL_BASED_OUTPATIENT_CLINIC_OR_DEPARTMENT_OTHER)
Admission: EM | Admit: 2021-06-19 | Discharge: 2021-06-27 | DRG: 308 | Disposition: A | Discharge status: Home Health | Payer: Medicare Other | Attending: Internal Medicine | Admitting: Internal Medicine

## 2021-06-19 ENCOUNTER — Other Ambulatory Visit: Payer: Self-pay

## 2021-06-19 DIAGNOSIS — I5021 Acute systolic (congestive) heart failure: Secondary | ICD-10-CM | POA: Diagnosis present

## 2021-06-19 DIAGNOSIS — R0602 Shortness of breath: Secondary | ICD-10-CM

## 2021-06-19 DIAGNOSIS — Z20822 Contact with and (suspected) exposure to covid-19: Secondary | ICD-10-CM | POA: Diagnosis present

## 2021-06-19 DIAGNOSIS — R578 Other shock: Secondary | ICD-10-CM | POA: Diagnosis not present

## 2021-06-19 DIAGNOSIS — R778 Other specified abnormalities of plasma proteins: Secondary | ICD-10-CM | POA: Diagnosis not present

## 2021-06-19 DIAGNOSIS — I130 Hypertensive heart and chronic kidney disease with heart failure and stage 1 through stage 4 chronic kidney disease, or unspecified chronic kidney disease: Secondary | ICD-10-CM | POA: Diagnosis present

## 2021-06-19 DIAGNOSIS — R570 Cardiogenic shock: Secondary | ICD-10-CM | POA: Diagnosis present

## 2021-06-19 DIAGNOSIS — I82452 Acute embolism and thrombosis of left peroneal vein: Secondary | ICD-10-CM | POA: Diagnosis not present

## 2021-06-19 DIAGNOSIS — N170 Acute kidney failure with tubular necrosis: Secondary | ICD-10-CM | POA: Diagnosis present

## 2021-06-19 DIAGNOSIS — E785 Hyperlipidemia, unspecified: Secondary | ICD-10-CM | POA: Diagnosis present

## 2021-06-19 DIAGNOSIS — I50811 Acute right heart failure: Secondary | ICD-10-CM | POA: Diagnosis not present

## 2021-06-19 DIAGNOSIS — Z452 Encounter for adjustment and management of vascular access device: Secondary | ICD-10-CM

## 2021-06-19 DIAGNOSIS — R5381 Other malaise: Secondary | ICD-10-CM | POA: Diagnosis present

## 2021-06-19 DIAGNOSIS — Z8249 Family history of ischemic heart disease and other diseases of the circulatory system: Secondary | ICD-10-CM

## 2021-06-19 DIAGNOSIS — I5023 Acute on chronic systolic (congestive) heart failure: Secondary | ICD-10-CM | POA: Diagnosis present

## 2021-06-19 DIAGNOSIS — Z823 Family history of stroke: Secondary | ICD-10-CM

## 2021-06-19 DIAGNOSIS — J9601 Acute respiratory failure with hypoxia: Secondary | ICD-10-CM | POA: Diagnosis not present

## 2021-06-19 DIAGNOSIS — I248 Other forms of acute ischemic heart disease: Secondary | ICD-10-CM | POA: Diagnosis not present

## 2021-06-19 DIAGNOSIS — N1831 Chronic kidney disease, stage 3a: Secondary | ICD-10-CM | POA: Diagnosis present

## 2021-06-19 DIAGNOSIS — I4892 Unspecified atrial flutter: Secondary | ICD-10-CM | POA: Diagnosis not present

## 2021-06-19 DIAGNOSIS — Z7982 Long term (current) use of aspirin: Secondary | ICD-10-CM

## 2021-06-19 DIAGNOSIS — R042 Hemoptysis: Secondary | ICD-10-CM | POA: Diagnosis not present

## 2021-06-19 DIAGNOSIS — R609 Edema, unspecified: Secondary | ICD-10-CM

## 2021-06-19 DIAGNOSIS — I469 Cardiac arrest, cause unspecified: Secondary | ICD-10-CM | POA: Diagnosis not present

## 2021-06-19 DIAGNOSIS — I82411 Acute embolism and thrombosis of right femoral vein: Secondary | ICD-10-CM | POA: Diagnosis present

## 2021-06-19 DIAGNOSIS — R0989 Other specified symptoms and signs involving the circulatory and respiratory systems: Secondary | ICD-10-CM | POA: Diagnosis not present

## 2021-06-19 DIAGNOSIS — I480 Paroxysmal atrial fibrillation: Secondary | ICD-10-CM | POA: Diagnosis present

## 2021-06-19 DIAGNOSIS — I4819 Other persistent atrial fibrillation: Secondary | ICD-10-CM | POA: Diagnosis not present

## 2021-06-19 DIAGNOSIS — R600 Localized edema: Secondary | ICD-10-CM | POA: Diagnosis not present

## 2021-06-19 DIAGNOSIS — I471 Supraventricular tachycardia: Secondary | ICD-10-CM | POA: Diagnosis present

## 2021-06-19 DIAGNOSIS — I82442 Acute embolism and thrombosis of left tibial vein: Secondary | ICD-10-CM | POA: Diagnosis not present

## 2021-06-19 DIAGNOSIS — Z538 Procedure and treatment not carried out for other reasons: Secondary | ICD-10-CM | POA: Diagnosis not present

## 2021-06-19 DIAGNOSIS — I2609 Other pulmonary embolism with acute cor pulmonale: Secondary | ICD-10-CM | POA: Diagnosis present

## 2021-06-19 DIAGNOSIS — E1122 Type 2 diabetes mellitus with diabetic chronic kidney disease: Secondary | ICD-10-CM | POA: Diagnosis not present

## 2021-06-19 DIAGNOSIS — I4891 Unspecified atrial fibrillation: Secondary | ICD-10-CM | POA: Diagnosis not present

## 2021-06-19 DIAGNOSIS — Z888 Allergy status to other drugs, medicaments and biological substances status: Secondary | ICD-10-CM

## 2021-06-19 DIAGNOSIS — Z8673 Personal history of transient ischemic attack (TIA), and cerebral infarction without residual deficits: Secondary | ICD-10-CM

## 2021-06-19 DIAGNOSIS — I2699 Other pulmonary embolism without acute cor pulmonale: Secondary | ICD-10-CM | POA: Diagnosis not present

## 2021-06-19 DIAGNOSIS — N179 Acute kidney failure, unspecified: Secondary | ICD-10-CM | POA: Diagnosis not present

## 2021-06-19 DIAGNOSIS — Z79899 Other long term (current) drug therapy: Secondary | ICD-10-CM

## 2021-06-19 DIAGNOSIS — I3139 Other pericardial effusion (noninflammatory): Secondary | ICD-10-CM | POA: Diagnosis present

## 2021-06-19 DIAGNOSIS — E119 Type 2 diabetes mellitus without complications: Secondary | ICD-10-CM

## 2021-06-19 DIAGNOSIS — R718 Other abnormality of red blood cells: Secondary | ICD-10-CM | POA: Diagnosis present

## 2021-06-19 DIAGNOSIS — I517 Cardiomegaly: Secondary | ICD-10-CM | POA: Diagnosis not present

## 2021-06-19 DIAGNOSIS — Z6841 Body Mass Index (BMI) 40.0 and over, adult: Secondary | ICD-10-CM | POA: Diagnosis not present

## 2021-06-19 DIAGNOSIS — I10 Essential (primary) hypertension: Secondary | ICD-10-CM

## 2021-06-19 DIAGNOSIS — R55 Syncope and collapse: Secondary | ICD-10-CM | POA: Diagnosis not present

## 2021-06-19 LAB — CBC WITH DIFFERENTIAL/PLATELET
Abs Immature Granulocytes: 0.03 10*3/uL (ref 0.00–0.07)
Basophils Absolute: 0 10*3/uL (ref 0.0–0.1)
Basophils Relative: 0 %
Eosinophils Absolute: 0 10*3/uL (ref 0.0–0.5)
Eosinophils Relative: 0 %
HCT: 42.2 % (ref 36.0–46.0)
Hemoglobin: 13.6 g/dL (ref 12.0–15.0)
Immature Granulocytes: 0 %
Lymphocytes Relative: 23 %
Lymphs Abs: 1.5 10*3/uL (ref 0.7–4.0)
MCH: 24.3 pg — ABNORMAL LOW (ref 26.0–34.0)
MCHC: 32.2 g/dL (ref 30.0–36.0)
MCV: 75.5 fL — ABNORMAL LOW (ref 80.0–100.0)
Monocytes Absolute: 0.6 10*3/uL (ref 0.1–1.0)
Monocytes Relative: 9 %
Neutro Abs: 4.6 10*3/uL (ref 1.7–7.7)
Neutrophils Relative %: 68 %
Platelets: 189 10*3/uL (ref 150–400)
RBC: 5.59 MIL/uL — ABNORMAL HIGH (ref 3.87–5.11)
RDW: 18.7 % — ABNORMAL HIGH (ref 11.5–15.5)
WBC: 6.7 10*3/uL (ref 4.0–10.5)
nRBC: 0.4 % — ABNORMAL HIGH (ref 0.0–0.2)

## 2021-06-19 LAB — URINALYSIS, ROUTINE W REFLEX MICROSCOPIC
Bilirubin Urine: NEGATIVE
Glucose, UA: NEGATIVE mg/dL
Ketones, ur: NEGATIVE mg/dL
Nitrite: NEGATIVE
Protein, ur: 30 mg/dL — AB
Specific Gravity, Urine: 1.021 (ref 1.005–1.030)
pH: 5 (ref 5.0–8.0)

## 2021-06-19 LAB — MAGNESIUM: Magnesium: 2.2 mg/dL (ref 1.7–2.4)

## 2021-06-19 LAB — RESP PANEL BY RT-PCR (FLU A&B, COVID) ARPGX2
Influenza A by PCR: NEGATIVE
Influenza B by PCR: NEGATIVE
SARS Coronavirus 2 by RT PCR: NEGATIVE

## 2021-06-19 LAB — GLUCOSE, CAPILLARY
Glucose-Capillary: 185 mg/dL — ABNORMAL HIGH (ref 70–99)
Glucose-Capillary: 149 mg/dL — ABNORMAL HIGH (ref 70–99)

## 2021-06-19 LAB — COMPREHENSIVE METABOLIC PANEL
ALT: 36 U/L (ref 0–44)
AST: 31 U/L (ref 15–41)
Albumin: 3.7 g/dL (ref 3.5–5.0)
Alkaline Phosphatase: 89 U/L (ref 38–126)
Anion gap: 14 (ref 5–15)
BUN: 55 mg/dL — ABNORMAL HIGH (ref 8–23)
CO2: 25 mmol/L (ref 22–32)
Calcium: 10.1 mg/dL (ref 8.9–10.3)
Chloride: 100 mmol/L (ref 98–111)
Creatinine, Ser: 1.45 mg/dL — ABNORMAL HIGH (ref 0.44–1.00)
GFR, Estimated: 38 mL/min — ABNORMAL LOW (ref >60)
Glucose, Bld: 149 mg/dL — ABNORMAL HIGH (ref 70–99)
Potassium: 3.8 mmol/L (ref 3.5–5.1)
Sodium: 139 mmol/L (ref 135–145)
Total Bilirubin: 1.8 mg/dL — ABNORMAL HIGH (ref 0.3–1.2)
Total Protein: 7.1 g/dL (ref 6.5–8.1)

## 2021-06-19 LAB — BRAIN NATRIURETIC PEPTIDE: B Natriuretic Peptide: 958.1 pg/mL — ABNORMAL HIGH (ref 0.0–100.0)

## 2021-06-19 LAB — AMMONIA: Ammonia: 27 umol/L (ref 9–35)

## 2021-06-19 LAB — TROPONIN I (HIGH SENSITIVITY)
Troponin I (High Sensitivity): 49 ng/L — ABNORMAL HIGH (ref <18)
Troponin I (High Sensitivity): 46 ng/L — ABNORMAL HIGH (ref <18)

## 2021-06-19 LAB — TSH: TSH: 1.412 u[IU]/mL (ref 0.350–4.500)

## 2021-06-19 MED ORDER — ACETAMINOPHEN 325 MG PO TABS
650.0000 mg | ORAL_TABLET | Freq: Four times a day (QID) | ORAL | Status: DC | PRN
  Administered 2021-06-24: 650 mg via ORAL
  Filled 2021-06-19 (×3): qty 2

## 2021-06-19 MED ORDER — DILTIAZEM HCL-DEXTROSE 125-5 MG/125ML-% IV SOLN (PREMIX)
5.0000 mg/h | INTRAVENOUS | Status: DC
  Administered 2021-06-19: 5 mg/h via INTRAVENOUS
  Administered 2021-06-19: 15 mg/h via INTRAVENOUS
  Filled 2021-06-19: qty 125

## 2021-06-19 MED ORDER — SODIUM CHLORIDE 0.9 % IV SOLN
250.0000 mL | INTRAVENOUS | Status: DC | PRN
  Administered 2021-06-21: 250 mL via INTRAVENOUS

## 2021-06-19 MED ORDER — METOPROLOL TARTRATE 5 MG/5ML IV SOLN: 5.0000 mg | Freq: Four times a day (QID) | INTRAVENOUS | Status: DC | PRN

## 2021-06-19 MED ORDER — ACETAMINOPHEN 650 MG RE SUPP: 650.0000 mg | Freq: Four times a day (QID) | RECTAL | Status: DC | PRN

## 2021-06-19 MED ORDER — AMIODARONE HCL IN DEXTROSE 360-4.14 MG/200ML-% IV SOLN
INTRAVENOUS | Status: AC
  Administered 2021-06-19: 60 mg/h
  Filled 2021-06-19: qty 200

## 2021-06-19 MED ORDER — AMIODARONE IV BOLUS ONLY 150 MG/100ML
150.0000 mg | Freq: Once | INTRAVENOUS | Status: DC
  Filled 2021-06-19: qty 100

## 2021-06-19 MED ORDER — AMIODARONE HCL IN DEXTROSE 360-4.14 MG/200ML-% IV SOLN
30.0000 mg/h | INTRAVENOUS | Status: DC
  Administered 2021-06-20 – 2021-06-23 (×9): 30 mg/h via INTRAVENOUS
  Filled 2021-06-19 (×9): qty 200

## 2021-06-19 MED ORDER — METOPROLOL TARTRATE 5 MG/5ML IV SOLN: 5.0000 mg | Freq: Once | INTRAVENOUS | Status: DC

## 2021-06-19 MED ORDER — INSULIN ASPART 100 UNIT/ML IJ SOLN
0.0000 [IU] | Freq: Three times a day (TID) | INTRAMUSCULAR | Status: DC
  Administered 2021-06-20 – 2021-06-21 (×2): 1 [IU] via SUBCUTANEOUS

## 2021-06-19 MED ORDER — SODIUM CHLORIDE 0.9% FLUSH: 3.0000 mL | INTRAVENOUS | Status: DC | PRN

## 2021-06-19 MED ORDER — FUROSEMIDE 10 MG/ML IJ SOLN
40.0000 mg | Freq: Two times a day (BID) | INTRAMUSCULAR | Status: DC
  Administered 2021-06-19 – 2021-06-21 (×4): 40 mg via INTRAVENOUS
  Filled 2021-06-19 (×4): qty 4

## 2021-06-19 MED ORDER — SODIUM CHLORIDE 0.9% FLUSH
3.0000 mL | Freq: Two times a day (BID) | INTRAVENOUS | Status: DC
  Administered 2021-06-20 – 2021-06-25 (×9): 3 mL via INTRAVENOUS

## 2021-06-19 MED ORDER — AMIODARONE HCL IN DEXTROSE 360-4.14 MG/200ML-% IV SOLN
60.0000 mg/h | INTRAVENOUS | Status: AC
  Administered 2021-06-19: 60 mg/h via INTRAVENOUS

## 2021-06-19 MED ORDER — HEPARIN (PORCINE) 25000 UT/250ML-% IV SOLN
1200.0000 [IU]/h | INTRAVENOUS | Status: DC
  Administered 2021-06-19 – 2021-06-20 (×2): 1250 [IU]/h via INTRAVENOUS
  Filled 2021-06-19 (×2): qty 250

## 2021-06-19 MED ORDER — METOPROLOL TARTRATE 12.5 MG HALF TABLET: 12.5000 mg | ORAL_TABLET | Freq: Four times a day (QID) | ORAL | Status: DC

## 2021-06-19 MED ORDER — POTASSIUM CHLORIDE CRYS ER 20 MEQ PO TBCR
20.0000 meq | EXTENDED_RELEASE_TABLET | Freq: Two times a day (BID) | ORAL | Status: DC | PRN
  Filled 2021-06-19 (×2): qty 1

## 2021-06-19 MED ORDER — AMIODARONE LOAD VIA INFUSION
150.0000 mg | Freq: Once | INTRAVENOUS | Status: AC
  Administered 2021-06-19: 150 mg via INTRAVENOUS
  Filled 2021-06-19: qty 83.34

## 2021-06-19 MED ORDER — HEPARIN BOLUS VIA INFUSION
4000.0000 [IU] | Freq: Once | INTRAVENOUS | Status: AC
  Administered 2021-06-19: 4000 [IU] via INTRAVENOUS
  Filled 2021-06-19: qty 4000

## 2021-06-19 NOTE — ED Provider Notes (Signed)
[facility] EMERGENCY DEPT Provider Note   CSN: [id number] Arrival date & time: 06/19/21  1124     History Chief Complaint  Patient presents with   Shortness of Breath    Ashley [name] is a 75 y.o. female.  The history is provided by the patient, a relative and medical records. No language interpreter was used.  Shortness of Breath Severity:  Severe Onset quality:  Gradual Duration:  3 weeks Timing:  Constant Progression:  Worsening Chronicity:  New Context: not URI   Relieved by:  Nothing Worsened by:  Nothing Ineffective treatments:  None tried Associated symptoms: no abdominal pain, no chest pain, no cough, no diaphoresis, no fever, no headaches, no neck pain, no rash, no sputum production, no vomiting and no wheezing       Past Medical History:  Diagnosis Date   Essential hypertension 11/23/2018   Obesity 11/23/2018   Stroke [facility])     Patient Active Problem List   Diagnosis Date Noted   Cerebral embolism with cerebral infarction 04/05/2020   Bradycardia 04/04/2020   Acute metabolic encephalopathy 04/04/2020   Elevated troponin level not due myocardial infarction 04/04/2020   Essential hypertension 11/23/2018   Controlled diabetes mellitus type II without complication (HCC) 11/23/2018   Obesity 11/23/2018    History reviewed. No pertinent surgical history.   OB History   No obstetric history on file.     Family History  Problem Relation Age of Onset   Stroke Mother    Hypertension Mother     Social History   Tobacco Use   Smoking status: Never   Smokeless tobacco: Never  Substance Use Topics   Alcohol use: No   Drug use: No    Home Medications Prior to Admission medications   Medication Sig Start Date End Date Taking? Authorizing Provider  acetaminophen (TYLENOL) 500 MG tablet Take 500 mg by mouth daily as needed for mild pain or headache.    [provider]  ASPIRIN 81 PO SMARTSIG:1 Tablet(s) By Mouth Daily  04/06/20   [provider]  cetirizine (ZYRTEC) 10 MG tablet Take 10 mg by mouth daily as needed for allergies.    [provider]  Cholecalciferol (VITAMIN D3) 5000 units CAPS Take 1 capsule by mouth daily.    [provider]  cloNIDine (CATAPRES) 0.2 MG tablet TAKE 1 TABLET (0.2 MG TOTAL) BY MOUTH 2 (TWO) TIMES DAILY. 08/31/20   Yates Decamp, MD  hydrochlorothiazide (MICROZIDE) 12.5 MG capsule Take 1 capsule (12.5 mg total) by mouth daily. 08/24/20 11/22/20  Cantwell, Celeste C, PA-C  rosuvastatin (CRESTOR) 5 MG tablet TAKE 1 TABLET BY MOUTH EVERY DAY 11/20/20   Cantwell, Celeste C, PA-C  spironolactone (ALDACTONE) 25 MG tablet TAKE 1 TABLET BY MOUTH EVERY DAY 12/21/20   Cantwell, Celeste C, PA-C  valsartan-hydrochlorothiazide (DIOVAN-HCT) 320-12.5 MG tablet TAKE 1 TABLET BY MOUTH EVERY DAY 09/21/20   Yates Decamp, MD    Allergies    Bystolic [nebivolol hcl], Coreg [carvedilol], and Minoxidil  Review of Systems   Review of Systems  Constitutional:  Positive for fatigue. Negative for chills, diaphoresis and fever.  HENT:  Negative for congestion.   Respiratory:  Positive for shortness of breath. Negative for cough, sputum production, chest tightness and wheezing.   Cardiovascular:  Positive for leg swelling. Negative for chest pain and palpitations.  Gastrointestinal:  Negative for abdominal pain, constipation, diarrhea, nausea and vomiting.  Genitourinary:  Negative for dysuria and flank pain.  Musculoskeletal:  Negative for  back pain, neck pain and neck stiffness.  Skin:  Negative for rash and wound.  Neurological:  Negative for weakness, light-headedness and headaches.  Psychiatric/Behavioral:  Negative for agitation and confusion.   All other systems reviewed and are negative.  Physical Exam Updated Vital Signs BP (!) 115/95 (BP Location: Left Arm)   Pulse (!) 172   Resp 17   SpO2 100%   Physical Exam Vitals and nursing note reviewed.  Constitutional:       General: She is not in acute distress.    Appearance: She is well-developed. She is not ill-appearing, toxic-appearing or diaphoretic.  HENT:     Head: Normocephalic and atraumatic.     Mouth/Throat:     Mouth: Mucous membranes are moist.  Eyes:     Conjunctiva/sclera: Conjunctivae normal.     Pupils: Pupils are equal, round, and reactive to light.  Cardiovascular:     Rate and Rhythm: Tachycardia present. Rhythm irregular.     Heart sounds: No murmur heard. Pulmonary:     Effort: Pulmonary effort is normal. Tachypnea present. No respiratory distress.     Breath sounds: Rales present. No decreased breath sounds, wheezing or rhonchi.  Chest:     Chest wall: No tenderness.  Abdominal:     Palpations: Abdomen is soft.     Tenderness: There is no abdominal tenderness.  Musculoskeletal:     Cervical back: Neck supple.     Right lower leg: Edema present.     Left lower leg: Edema present.  Skin:    General: Skin is warm and dry.     Capillary Refill: Capillary refill takes less than 2 seconds.     Findings: No erythema or rash.  Neurological:     General: No focal deficit present.     Mental Status: She is alert.  Psychiatric:        Mood and Affect: Mood normal.    ED Results / Procedures / Treatments   Labs (all labs ordered are listed, but only abnormal results are displayed) Labs Reviewed  CBC WITH DIFFERENTIAL/PLATELET - Abnormal; Notable for the following components:      Result Value   RBC 5.59 (*)    MCV 75.5 (*)    MCH 24.3 (*)    RDW 18.7 (*)    nRBC 0.4 (*)    All other components within normal limits  COMPREHENSIVE METABOLIC PANEL - Abnormal; Notable for the following components:   Glucose, Bld 149 (*)    BUN 55 (*)    Creatinine, Ser 1.45 (*)    Total Bilirubin 1.8 (*)    GFR, Estimated 38 (*)    All other components within normal limits  BRAIN NATRIURETIC PEPTIDE - Abnormal; Notable for the following components:   B Natriuretic Peptide 958.1 (*)    All  other components within normal limits  TROPONIN I (HIGH SENSITIVITY) - Abnormal; Notable for the following components:   Troponin I (High Sensitivity) 49 (*)    All other components within normal limits  TROPONIN I (HIGH SENSITIVITY) - Abnormal; Notable for the following components:   Troponin I (High Sensitivity) 46 (*)    All other components within normal limits  RESP PANEL BY RT-PCR (FLU A&B, COVID) ARPGX2  MAGNESIUM  AMMONIA  TSH  URINALYSIS, ROUTINE W REFLEX MICROSCOPIC    EKG EKG Interpretation  Date/Time:  Saturday June 19 2021 11:43:12 EDT Ventricular Rate:  175 PR Interval:    QRS Duration: 108 QT Interval:  292  QTC Calculation: 499 R Axis:   22 Text Interpretation: Atrial fibrillation with rapid V-rate Anterior infarct, old Repolarization abnormality, prob rate related When compared to prior, now afib with RVR. No STEMI Confirmed by Theda Belfast ([id number]) on 06/19/2021 12:44:40 PM  Radiology DG Chest Portable 1 View  Result Date: 06/19/2021 CLINICAL DATA:  New A. fib with RVR, new edema, worsening exertional shortness of breath rales on exam. EXAM: PORTABLE CHEST 1 VIEW COMPARISON:  None. FINDINGS: Cardiomegaly. No significant pleural effusion. No pneumothorax. No focal consolidation appreciated. No overt pulmonary edema. No acute osseous abnormality. IMPRESSION: Cardiomegaly without overt pulmonary edema. No significant effusion or consolidative process in the lungs. Electronically Signed   By: Olive Bass M.D.   On: 06/19/2021 13:05    Procedures Procedures   CRITICAL CARE Performed by: Canary Brim Jeancarlos Marchena Total critical care time: 35 minutes Critical care time was exclusive of separately billable procedures and treating other patients. Critical care was necessary to treat or prevent imminent or life-threatening deterioration. Critical care was time spent personally by me on the following activities: development of treatment plan with patient and/or  surrogate as well as nursing, discussions with consultants, evaluation of patient's response to treatment, examination of patient, obtaining history from patient or surrogate, ordering and performing treatments and interventions, ordering and review of laboratory studies, ordering and review of radiographic studies, pulse oximetry and re-evaluation of patient's condition.   Medications Ordered in ED Medications  diltiazem (CARDIZEM) 125 mg in dextrose 5% 125 mL (1 mg/mL) infusion (15 mg/hr Intravenous Rate/Dose Change 06/19/21 1331)     ED Course  I have reviewed the triage vital signs and the nursing notes.  Pertinent labs & imaging results that were available during my care of the patient were reviewed by me and considered in my medical decision making (see chart for details).    MDM Rules/Calculators/A&P                           Ashley [name] is a 75 y.o. female with a past medical history significant for hypertension, diabetes, previous stroke, and previous metabolic encephalopathy who presents with several weeks of worsening generalized fatigue, peripheral edema, exertional shortness of breath, and lack of energy.  Patient reports that for the last few weeks she has gradually had worsening symptoms of fatigue.  She says that she thought it was stress-induced as she feels tired all the time.  She says that she has had gradual worsening of edema in both legs that seems to be creeping up her body.  She reports she is getting more winded and short of breath especially when she tries to ambulate.  She reports today has been the worst.  She reports no palpitations or chest pain whatsoever.  She says that she may have been told she had intermittent atrial fibrillation in the past but she has no idea.  She is not on any blood thinners.  She denies any syncopal episodes.  She denies fevers, chills, or congestion but does report a mild dry cough occasionally.  She reports is not different from  her baseline.  She reports no urinary changes, constipation, or diarrhea.  No reported exposures to infections.  On exam, lungs have rales.  Chest is nontender.  Abdomen nontender.  Patient has pitting edema in both legs going up towards her knees.  Patient has normal sensation, strength, and pulses in extremities.  On arrival, heart rate is in the 170s  and appears to show A. fib with RVR.  No STEMI seen on EKG.  Clinically I suspect patient has been in A. fib for several weeks now with the fatigue and has had an unknown amount of time and RVR.  I suspect this is causing the worsening peripheral edema and the symptoms of exertional shortness of breath and fatigue.  As the patient does appear fluid overloaded, will be careful not to over hydrate but we will start diltiazem to help slow her heart rate down.  We will get electrolytes and labs as well as a troponin to look for heart injury given the shortness of breath.  We will get a preadmission COVID test and anticipate she will need admission for new A. fib with RVR with evidence of developing fluid overload and shortness of breath.  Patient was placed on oxygen for significant work of breathing that has started to improve.  Anticipate admission after work-up  2:22 PM Patient's work-up began to return and BNP is more elevated at 951.  Troponin is elevated at 49 but this is slightly decreased and similar to prior.  Metabolic panel does show a mild AKI with a creatinine of 1.45 but CBC shows no leukocytosis or anemia.  Ammonia not elevated.  Chest x-ray shows similar cardiomegaly but no evidence of acute fluid overload.  EKG continues to show A. fib with RVR with rate in the 160s now.  It has increased from the 170s but she is now on the diltiazem drip that has been titrated up.  Will call cardiology to discuss further management as it appears she has allergy/intolerance to some beta-blockers but anticipate admission to medicine.  Spoke with  [place] cardiology who recommended continuing the diltiazem drip and not adding any other medications yet.  They requested admission to medicine service and they will see upon arrival.  Patient admitted to medicine service for further management of A. fib with RVR causing fluid overload and exertional shortness of breath.   Final Clinical Impression(s) / ED Diagnoses Final diagnoses:  Atrial fibrillation with RVR (HCC)  Peripheral edema  Exertional shortness of breath      Clinical Impression: 1. Atrial fibrillation with RVR (HCC)   2. Peripheral edema   3. Exertional shortness of breath     Disposition: Admit  This note was prepared with assistance of Dragon voice recognition software. Occasional wrong-word or sound-a-like substitutions may have occurred due to the inherent limitations of voice recognition software.     Jobin Montelongo, Canary Brim, MD 06/19/21 [phone]

## 2021-06-19 NOTE — Progress Notes (Signed)
   06/19/21 1900  Assess: MEWS Score  BP (!) 102/92  Pulse Rate (!) 106  ECG Heart Rate (!) 139  Resp (!) 22  SpO2 96 %  Assess: MEWS Score  MEWS Temp 0  MEWS Systolic 0  MEWS Pulse 3  MEWS RR 1  MEWS LOC 0  MEWS Score 4  MEWS Score Color Red  Assess: if the MEWS score is Yellow or Red  Were vital signs taken at a resting state? Yes  Focused Assessment No change from prior assessment  Early Detection of Sepsis Score *See Row Information* Low  MEWS guidelines implemented *See Row Information* Yes  Treat  MEWS Interventions Escalated (See documentation below)  Pain Scale 0-10  Pain Score 0  Take Vital Signs  Increase Vital Sign Frequency  Red: Q 1hr X 4 then Q 4hr X 4, if remains red, continue Q 4hrs  Escalate  MEWS: Escalate Red: discuss with charge nurse/RN and provider, consider discussing with RRT  Notify: Charge Nurse/RN  Name of Charge Nurse/RN Notified April Cooper  Date Charge Nurse/RN Notified 06/19/21  Time Charge Nurse/RN Notified 2009  Notify: Provider  Provider Name/Title Dr.Patwardin  Date Provider Notified 06/19/21  Notification Type  (Pt admitted with elevated HR, RR, being treated)  Notification Reason Other (Comment) (no change in status)  Notify: Rapid Response  Name of Rapid Response RN Notified Mindy, RN  Date Rapid Response Notified 06/19/21  Time Rapid Response Notified 2011

## 2021-06-19 NOTE — Progress Notes (Signed)
ANTICOAGULATION CONSULT NOTE - Initial Consult  Pharmacy Consult for Heparin Indication: atrial fibrillation  Allergies  Allergen Reactions   Bystolic [Nebivolol Hcl]     Bradycardia    Coreg [Carvedilol]     bradycardia   Minoxidil     Abdominal pain    Patient Measurements: Height: 5\' 7"  (170.2 cm) Weight: 121 kg (266 lb 12.1 oz) IBW/kg (Calculated) : 61.6 Heparin Dosing Weight: 90 kg  Vital Signs: Temp: 98 F (36.7 C) (10/01 1734) Temp Source: Oral (10/01 1734) BP: 123/99 (10/01 1734) Pulse Rate: 72 (10/01 1734)  Labs: Recent Labs    06/19/21 1145 06/19/21 1411  HGB 13.6  --   HCT 42.2  --   PLT 189  --   CREATININE 1.45*  --   TROPONINIHS 49* 46*    Estimated Creatinine Clearance: 45.9 mL/min (A) (by C-G formula based on SCr of 1.45 mg/dL (H)).   Medical History: Past Medical History:  Diagnosis Date   Essential hypertension 11/23/2018   Obesity 11/23/2018   Stroke [facility])       Assessment: 75 yo W with afib RVR. No anticoagulation prior to admission. Pharmacy consulted for heparin.  H/H, plt stable.   Goal of Therapy:  Heparin level 0.3-0.7 units/ml Monitor platelets by anticoagulation protocol: Yes   Plan:  Heparin bolus 4000 units then 1250 units/hr F/u 6hr HL  Monitor daily HL, CBC/plt Monitor for signs/symptoms of bleeding    66, PharmD, BCPS, BCCP Clinical Pharmacist  Please check AMION for all [facility] Pharmacy phone numbers After 10:00 PM, call Main Pharmacy [phone]

## 2021-06-19 NOTE — Consult Note (Signed)
CARDIOLOGY CONSULT NOTE  Patient ID: Ashley [name] MRN: [id number] DOB/AGE: 75-Dec-1947 75 y.o.  Admit date: 06/19/2021 Referring Physician: [facility] hospital[facility] Reason for Consultation:  Afib  HPI:   75 y.o. African American female  with hypertension, type 2 DM, obesity, h/o atrial tachycardia, h/o stroke (03/2020), now admitted with Afib RVR  Patient has been experiencing bilateral leg swelling for last couple of days.  Today, she also experience shortness of breath intermittently exertion.  She presented to [facility] and was found to be in A. fib RVR, and was therefore transferred to [facility].  Patient is currently comfortable, denies any chest pain or shortness of breath at rest.  She is requiring supplemental oxygen.  She denies any other complaints at this time.  Of note, patient has had resting bradycardia with heart rate 30-60-minute, noted on previous EKGs.     Past Medical History:  Diagnosis Date   Essential hypertension 11/23/2018   Obesity 11/23/2018   Stroke [facility])      History reviewed. No pertinent surgical history.    Family History  Problem Relation Age of Onset   Stroke Mother    Hypertension Mother      Social History: Social History   Socioeconomic History   Marital status: Married    Spouse name: Not on file   Number of children: 1   Years of education: Not on file   Highest education level: Not on file  Occupational History   Not on file  Tobacco Use   Smoking status: Never   Smokeless tobacco: Never  Substance and Sexual Activity   Alcohol use: No   Drug use: No   Sexual activity: Not on file  Other Topics Concern   Not on file  Social History Narrative   Not on file   Social Determinants of Health   Financial Resource Strain: Not on file  Food Insecurity: Not on file  Transportation Needs: Not on file  Physical Activity: Not on file  Stress: Not on file  Social Connections: Not on file  Intimate Partner  Violence: Not on file     Medications Prior to Admission  Medication Sig Dispense Refill Last Dose   acetaminophen (TYLENOL) 500 MG tablet Take 500 mg by mouth daily as needed for mild pain or headache.      ASPIRIN 81 PO SMARTSIG:1 Tablet(s) By Mouth Daily      cetirizine (ZYRTEC) 10 MG tablet Take 10 mg by mouth daily as needed for allergies.      Cholecalciferol (VITAMIN D3) 5000 units CAPS Take 1 capsule by mouth daily.      cloNIDine (CATAPRES) 0.2 MG tablet TAKE 1 TABLET (0.2 MG TOTAL) BY MOUTH 2 (TWO) TIMES DAILY. 180 tablet 2    hydrochlorothiazide (MICROZIDE) 12.5 MG capsule Take 1 capsule (12.5 mg total) by mouth daily. 90 capsule 3    rosuvastatin (CRESTOR) 5 MG tablet TAKE 1 TABLET BY MOUTH EVERY DAY 90 tablet 2    spironolactone (ALDACTONE) 25 MG tablet TAKE 1 TABLET BY MOUTH EVERY DAY 90 tablet 1    valsartan-hydrochlorothiazide (DIOVAN-HCT) 320-12.5 MG tablet TAKE 1 TABLET BY MOUTH EVERY DAY 90 tablet 2     Review of Systems  Constitutional: Negative for decreased appetite, malaise/fatigue, weight gain and weight loss.  HENT:  Negative for congestion.   Eyes:  Negative for visual disturbance.  Cardiovascular:  Positive for dyspnea on exertion and leg swelling. Negative for chest pain, palpitations and syncope.  Respiratory:  Negative for cough.   Endocrine: Negative for cold intolerance.  Hematologic/Lymphatic: Does not bruise/bleed easily.  Skin:  Negative for itching and rash.  Musculoskeletal:  Negative for myalgias.  Gastrointestinal:  Negative for abdominal pain, nausea and vomiting.  Genitourinary:  Positive for dysuria.  Neurological:  Negative for dizziness and weakness.  Psychiatric/Behavioral:  The patient is not nervous/anxious.   All other systems reviewed and are negative.    Physical Exam: Physical Exam Vitals and nursing note reviewed.  Constitutional:      General: She is not in acute distress.    Appearance: She is well-developed.  HENT:      Head: Normocephalic and atraumatic.  Eyes:     Conjunctiva/sclera: Conjunctivae normal.     Pupils: Pupils are equal, round, and reactive to light.  Neck:     Vascular: JVD present.  Cardiovascular:     Rate and Rhythm: Tachycardia present. Rhythm irregular.     Pulses: Normal pulses and intact distal pulses.     Heart sounds: No murmur heard. Pulmonary:     Effort: Pulmonary effort is normal.     Breath sounds: Examination of the right-middle field reveals rales. Examination of the right-lower field reveals rales. Rales present. No wheezing.  Abdominal:     General: Bowel sounds are normal.     Palpations: Abdomen is soft.     Tenderness: There is no rebound.  Musculoskeletal:        General: No tenderness. Normal range of motion.     Right lower leg: Edema (2+) present.     Left lower leg: Edema (2+) present.  Lymphadenopathy:     Cervical: No cervical adenopathy.  Skin:    General: Skin is warm and dry.  Neurological:     Mental Status: She is alert and oriented to person, place, and time.     Cranial Nerves: No cranial nerve deficit.     Labs:   Lab Results  Component Value Date   WBC 6.7 06/19/2021   HGB 13.6 06/19/2021   HCT 42.2 06/19/2021   MCV 75.5 (L) 06/19/2021   PLT 189 06/19/2021    Recent Labs  Lab 06/19/21 1145  NA 139  K 3.8  CL 100  CO2 25  BUN 55*  CREATININE 1.45*  CALCIUM 10.1  PROT 7.1  BILITOT 1.8*  ALKPHOS 89  ALT 36  AST 31  GLUCOSE 149*    Lipid Panel     Component Value Date/Time   CHOL 163 04/05/2020 0334   TRIG 64 04/05/2020 0334   HDL 63 04/05/2020 0334   CHOLHDL 2.6 04/05/2020 0334   VLDL 13 04/05/2020 0334   LDLCALC 87 04/05/2020 0334    BNP (last 3 results) Recent Labs    06/19/21 1145  BNP 958.1*    HEMOGLOBIN A1C Lab Results  Component Value Date   HGBA1C 6.2 (H) 04/04/2020   MPG 131.24 04/04/2020    Cardiac Panel (last 3 results) No results for input(s): CKTOTAL, CKMB, RELINDX in the last 8760  hours.  Invalid input(s): TROPONINHS  No results found for: CKTOTAL, CKMB, CKMBINDEX   TSH Recent Labs    06/19/21 1229  TSH 1.412      Radiology: DG Chest Portable 1 View  Result Date: 06/19/2021 CLINICAL DATA:  New A. fib with RVR, new edema, worsening exertional shortness of breath rales on exam. EXAM: PORTABLE CHEST 1 VIEW COMPARISON:  None. FINDINGS: Cardiomegaly. No significant pleural effusion. No pneumothorax. No focal consolidation appreciated. No  overt pulmonary edema. No acute osseous abnormality. IMPRESSION: Cardiomegaly without overt pulmonary edema. No significant effusion or consolidative process in the lungs. Electronically Signed   By: Olive Bass M.D.   On: 06/19/2021 13:05    Scheduled Meds:  furosemide  40 mg Intravenous Q12H   Continuous Infusions:  amiodarone     amiodarone     PRN Meds:.metoprolol tartrate, potassium chloride  CARDIAC STUDIES:  EKG 06/19/2021: A. fib with RVR, nonspecific T wave changes  Echocardiogram 04/05/2020:  1. Left ventricular ejection fraction, by estimation, is 55 to 60%. The  left ventricle has normal function. The left ventricle has no regional  wall motion abnormalities. There is mild left ventricular hypertrophy.  Left ventricular diastolic parameters  are indeterminate.   2. Right ventricular systolic function is normal. The right ventricular  size is normal.   3. Left atrial size was moderately dilated.   4. Nosignificant valvular abnormality.   5. Agitated saline contrast bubble study was negative, with no evidence  of any interatrial shunt.     Assessment & Recommendations:   75 y.o. African American female  with hypertension, type 2 DM, obesity, h/o atrial tachycardia, h/o stroke (03/2020), now admitted with Afib RVR  A. fib with RVR: Prior history of atrial tachycardia and resting bradycardia.  A. fib is new diagnosis. Ventricular rate around 170 bpm at this time.  She is hemodynamically stable with  blood pressure of 140s/110s.  Heart rate has not come down with IV diltiazem drip. I recommend discontinuing diltiazem, and starting IV amiodarone bolus and infusion. I suspect she may have a component of systolic heart failure, either etiology of, or as result of A. fib with RVR. Given her resting bradycardia at baseline, I would avoid concurrent use of scheduled amiodarone and another AV nodal blocking agent.  That said, if she has persistent heart rate greater than 180 bpm for greater than 15 minutes, we could use IV metoprolol 5 mg every 6 hours as needed. Will obtain echocardiogram after getting her rate better controlled. Also recommend IV Lasix 40 mg twice daily. I anticipate her creatinine may increase further, with aggressive diuresis.  However, in the long run, I do expect the creatinine to improve with adequate diuresis. CHA2DS2VASc score 6, annual stroke risk 9% Recommend IV heparin for now.  If she does not have adequate rate control or conversion with a above management, she may need TEE guided cardioversion on Monday. No acute indication for emergent cardioversion, as her blood pressure is stable     Elder Negus, MD Pager: [phone] Office: [phone]

## 2021-06-19 NOTE — ED Notes (Signed)
Pt states she is unable to provide urine sample at this time.

## 2021-06-19 NOTE — ED Notes (Signed)
Pt states she does  not feel able to urinate yet at this time.

## 2021-06-19 NOTE — ED Triage Notes (Signed)
Pt reports for shortness of breath, bilateral lower extr swelling has been increasing for 3 weeks.

## 2021-06-19 NOTE — ED Notes (Signed)
Labs drawn and sent to hold until orders

## 2021-06-19 NOTE — ED Notes (Signed)
Her monitor shows AF with RVR with average heart rate of ~160. Pt. Is in no distress.

## 2021-06-19 NOTE — H&P (Addendum)
History and Physical    Ashley [name] BBC:[id number] DOB: Jul 30, 1946 DOA: 06/19/2021  PCP: Merri Brunette, MD Consultants:  cardiology: Dr. Jacinto Halim, neurology: [facility].  Patient coming from:  [place]. Lives at Home with her son   Chief Complaint: shortness of breath, fatigue and leg swelling   HPI: Ashley [name] is a 75 y.o. female with medical history significant of HTN, HLD, T2DM, atrial tachycardia, cryptogenic stroke presenting to Ed with 2 week history of fatigue, leg swelling and 1-2 day history of acute worsening dyspnea. She states over the last 2 weeks she has had fatigue and just feeling down. This week she started to have more noticeable swelling in both of her legs and has also had to sleep in her recliner instead of her bed in order to sleep. She has a cough in the morning. She then had acute onset dyspnea on Wednesday that prompted her family to bring her to the ED.    She denies any recent fever/chills, headache, dizziness, chest pain, palpitations, stomach pain, N/V/D, urinary urgency or frequency. Had some dysuria earlier this week, but it passed with some water.   ED Course: vitals: Afebrile, blood pressure 115/95, heart rate 172, respiratory rate 17, oxygen 100% on 4 L nasal cannula Pertinent labs: MCV 75.5, BUN 55, creatinine 1.45, troponin 49-->46, BNP 958, Chest x-ray shows cardiomegaly without overt pulmonary edema.  No significant effusion or consolidation.  He was started on a Cardizem drip and cardiology was consulted who recommended she stay on Cardizem drip as she has had severe bradycardia to beta-blockers in the past and did not want to start amiodarone at this time [facility] was called and asked to admit.  Review of Systems: As per HPI; otherwise review of systems reviewed and negative.   Ambulatory Status:  Ambulates without assistance   Past Medical History:  Diagnosis Date   Essential hypertension 11/23/2018   Obesity  11/23/2018   Stroke [facility])     History reviewed. No pertinent surgical history.  Social History   Socioeconomic History   Marital status: Married    Spouse name: Not on file   Number of children: 1   Years of education: Not on file   Highest education level: Not on file  Occupational History   Not on file  Tobacco Use   Smoking status: Never   Smokeless tobacco: Never  Substance and Sexual Activity   Alcohol use: No   Drug use: No   Sexual activity: Not on file  Other Topics Concern   Not on file  Social History Narrative   Not on file   Social Determinants of Health   Financial Resource Strain: Not on file  Food Insecurity: Not on file  Transportation Needs: Not on file  Physical Activity: Not on file  Stress: Not on file  Social Connections: Not on file  Intimate Partner Violence: Not on file    Allergies  Allergen Reactions   Bystolic [Nebivolol Hcl]     Bradycardia    Coreg [Carvedilol]     bradycardia   Minoxidil     Abdominal pain    Family History  Problem Relation Age of Onset   Stroke Mother    Hypertension Mother     Prior to Admission medications   Medication Sig Start Date End Date Taking? Authorizing Provider  acetaminophen (TYLENOL) 500 MG tablet Take 500 mg by mouth daily as needed for mild pain or headache.    [provider]  ASPIRIN  81 PO SMARTSIG:1 Tablet(s) By Mouth Daily 04/06/20   [provider]  cetirizine (ZYRTEC) 10 MG tablet Take 10 mg by mouth daily as needed for allergies.    [provider]  Cholecalciferol (VITAMIN D3) 5000 units CAPS Take 1 capsule by mouth daily.    [provider]  cloNIDine (CATAPRES) 0.2 MG tablet TAKE 1 TABLET (0.2 MG TOTAL) BY MOUTH 2 (TWO) TIMES DAILY. 08/31/20   Yates Decamp, MD  hydrochlorothiazide (MICROZIDE) 12.5 MG capsule Take 1 capsule (12.5 mg total) by mouth daily. 08/24/20 11/22/20  Cantwell, Celeste C, PA-C  rosuvastatin (CRESTOR) 5 MG tablet TAKE 1 TABLET BY  MOUTH EVERY DAY 11/20/20   Cantwell, Celeste C, PA-C  spironolactone (ALDACTONE) 25 MG tablet TAKE 1 TABLET BY MOUTH EVERY DAY 12/21/20   Cantwell, Celeste C, PA-C  valsartan-hydrochlorothiazide (DIOVAN-HCT) 320-12.5 MG tablet TAKE 1 TABLET BY MOUTH EVERY DAY 09/21/20   Yates Decamp, MD    Physical Exam: Vitals:   06/19/21 1409 06/19/21 1518 06/19/21 1734 06/19/21 1800  BP: (!) 139/101 (!) 130/97 (!) 123/99   Pulse: (!) 161 79 72   Resp: (!) 25 (!) 26 19   Temp:   98 F (36.7 C)   TempSrc:   Oral   SpO2: 99% 98% 90%   Weight:    121 kg  Height:    5\' 7"  (1.702 m)     General:  Appears calm and comfortable and is in NAD Eyes:  PERRL, EOMI, normal lids, iris ENT:  grossly normal hearing, lips & tongue, mmm; missing teeth  Neck:  no LAD, masses or thyromegaly; no carotid bruits. +JVD Cardiovascular:  irregularly, irregular, no m/r/g. +BLE to mid calf 2+ Respiratory: quiet right basilar crackles   Normal respiratory effort. Abdomen:  soft, NT, ND, NABS Back:   normal alignment, no CVAT Skin:  no rash or induration seen on limited exam Musculoskeletal:  grossly normal tone BUE/BLE, good ROM, no bony abnormality Lower extremity:  No LE edema.  Limited foot exam with no ulcerations.  2+ distal pulses. Psychiatric:  grossly normal mood and affect, speech fluent and appropriate, AOx3 Neurologic:  CN 2-12 grossly intact, moves all extremities in coordinated fashion, sensation intact    Radiological Exams on Admission: Independently reviewed - see discussion in A/P where applicable  DG Chest Portable 1 View  Result Date: 06/19/2021 CLINICAL DATA:  New A. fib with RVR, new edema, worsening exertional shortness of breath rales on exam. EXAM: PORTABLE CHEST 1 VIEW COMPARISON:  None. FINDINGS: Cardiomegaly. No significant pleural effusion. No pneumothorax. No focal consolidation appreciated. No overt pulmonary edema. No acute osseous abnormality. IMPRESSION: Cardiomegaly without overt pulmonary  edema. No significant effusion or consolidative process in the lungs. Electronically Signed   By: 08/19/2021 M.D.   On: 06/19/2021 13:05    EKG: Independently reviewed.  with rate 175; nonspecific ST changes with no evidence of acute ischemia   Labs on Admission: I have personally reviewed the available labs and imaging studies at the time of the admission.  Pertinent labs:  MCV 75.5,  BUN 55,  creatinine 1.45,  troponin 49-->46,  BNP 958,  Assessment/Plan Principal Problem:   New onset atrial fibrillation (HCC) 75 year old female presenting with leg swelling, fatigue and dyspnea found to be in afib with RVR at 175.  -admit to progressive as on amiodorone drip (changed from diltiazam by cardiology) -cardiology consulted and following. Appreciate assitance -TSH wnl  -echo when heart rate better controlled, likely some  CHF component. Diuresis per cardiology with lasix 40mg  bid.  -CHA2DsVASc2 score of 6, anticoagulation with heparin gtt per cards.  -if no improvement on amiodarone drip, will need cardioversion Monday unless urgently needed over weekend due to decompensation.  -History of severe bradycardia with beta-blockers.  Avoid AV nodal blocking agents with amiodarone.  Active Problems:   AKI (acute kidney injury) (HCC) Baseline creatinine normal to 1.05 Likely prerenal in setting of hypoperfusion from afib with rvr. Suspect improvement as heart rate gets under better control, do not suspect cardiorenal at this point.  Check urine sodium  Intake/output Watch renal function with diuresis -hold hctz/spironolactone     Elevated troponin -no ST elevation, no chest pain. Likely demand ischemia in setting of afib with RVR -on telemetry -delta relatively flat  Hx of CVA Continue ASA/statin Lipid panel 8/22: TC: 186, HDL: 73, LDL: 95, TG: 89    Essential hypertension Soft readings, hold home hctz and spironolactone in setting of AKI and soft bp.  Avoid beta blockers     Controlled diabetes mellitus type II without complication vs. Prediabetes  Recent a1c of 6.1 in 04/2021 Continue diabetic diet  -SSI and accuchecks per protocol    Microcytosis  Iron panel pending    Body mass index is 41.78 kg/m.   Level of care: Progressive DVT prophylaxis:  heparin gtt Code Status:  Full - confirmed with patient Family Communication: Niece at bedside: donna Clarke  Disposition Plan:  The patient is from: home  Anticipated d/c is to: home  Requires inpatient hospitalization and is at significant risk of  worsening, requires constant monitoring, assessment and MDM with specialists.    Patient is currently: stable  Consults called: cardiology   Admission status:  observation   Dragon dictation used in completing this note.    05/2021 MD [facility]   How to contact the [facility] Attending or Consulting provider 7A - 7P or covering provider during after hours 7P -7A, for this patient?  Check the care team in [facility] and look for a) attending/consulting [facility] provider listed and b) the [facility] team listed Log into [email or web address] and use [place]'s universal password to access. If you do not have the password, please contact the hospital operator. Locate the [facility] provider you are looking for under [facility] and page to a number that you can be directly reached. If you still have difficulty reaching the provider, please page the [facility] (Director on Call) for the Hospitalists listed on amion for assistance.   06/19/2021, 6:32 PM

## 2021-06-19 NOTE — ED Notes (Signed)
Called [facility] and advised bed was ready @ 16:20, [facility]-03

## 2021-06-19 NOTE — ED Notes (Signed)
Report called to inpatient unit RN.

## 2021-06-20 DIAGNOSIS — I2699 Other pulmonary embolism without acute cor pulmonale: Secondary | ICD-10-CM | POA: Diagnosis not present

## 2021-06-20 DIAGNOSIS — I4891 Unspecified atrial fibrillation: Secondary | ICD-10-CM | POA: Diagnosis present

## 2021-06-20 DIAGNOSIS — J90 Pleural effusion, not elsewhere classified: Secondary | ICD-10-CM | POA: Diagnosis not present

## 2021-06-20 DIAGNOSIS — R570 Cardiogenic shock: Secondary | ICD-10-CM | POA: Diagnosis not present

## 2021-06-20 DIAGNOSIS — J9811 Atelectasis: Secondary | ICD-10-CM | POA: Diagnosis not present

## 2021-06-20 DIAGNOSIS — I10 Essential (primary) hypertension: Secondary | ICD-10-CM | POA: Diagnosis not present

## 2021-06-20 DIAGNOSIS — I469 Cardiac arrest, cause unspecified: Secondary | ICD-10-CM | POA: Diagnosis not present

## 2021-06-20 DIAGNOSIS — I50811 Acute right heart failure: Secondary | ICD-10-CM | POA: Diagnosis not present

## 2021-06-20 DIAGNOSIS — Z20822 Contact with and (suspected) exposure to covid-19: Secondary | ICD-10-CM | POA: Diagnosis not present

## 2021-06-20 DIAGNOSIS — R0602 Shortness of breath: Secondary | ICD-10-CM | POA: Diagnosis present

## 2021-06-20 DIAGNOSIS — E119 Type 2 diabetes mellitus without complications: Secondary | ICD-10-CM | POA: Diagnosis not present

## 2021-06-20 DIAGNOSIS — R042 Hemoptysis: Secondary | ICD-10-CM | POA: Diagnosis not present

## 2021-06-20 DIAGNOSIS — N179 Acute kidney failure, unspecified: Secondary | ICD-10-CM | POA: Diagnosis not present

## 2021-06-20 DIAGNOSIS — I4819 Other persistent atrial fibrillation: Secondary | ICD-10-CM | POA: Diagnosis not present

## 2021-06-20 DIAGNOSIS — I82411 Acute embolism and thrombosis of right femoral vein: Secondary | ICD-10-CM | POA: Diagnosis present

## 2021-06-20 DIAGNOSIS — I130 Hypertensive heart and chronic kidney disease with heart failure and stage 1 through stage 4 chronic kidney disease, or unspecified chronic kidney disease: Secondary | ICD-10-CM | POA: Diagnosis not present

## 2021-06-20 DIAGNOSIS — N170 Acute kidney failure with tubular necrosis: Secondary | ICD-10-CM | POA: Diagnosis not present

## 2021-06-20 DIAGNOSIS — I517 Cardiomegaly: Secondary | ICD-10-CM | POA: Diagnosis not present

## 2021-06-20 DIAGNOSIS — I82442 Acute embolism and thrombosis of left tibial vein: Secondary | ICD-10-CM | POA: Diagnosis present

## 2021-06-20 DIAGNOSIS — I3139 Other pericardial effusion (noninflammatory): Secondary | ICD-10-CM | POA: Diagnosis present

## 2021-06-20 DIAGNOSIS — I82452 Acute embolism and thrombosis of left peroneal vein: Secondary | ICD-10-CM | POA: Diagnosis present

## 2021-06-20 DIAGNOSIS — I248 Other forms of acute ischemic heart disease: Secondary | ICD-10-CM | POA: Diagnosis present

## 2021-06-20 DIAGNOSIS — N1831 Chronic kidney disease, stage 3a: Secondary | ICD-10-CM | POA: Diagnosis present

## 2021-06-20 DIAGNOSIS — R778 Other specified abnormalities of plasma proteins: Secondary | ICD-10-CM | POA: Diagnosis not present

## 2021-06-20 DIAGNOSIS — R609 Edema, unspecified: Secondary | ICD-10-CM | POA: Diagnosis not present

## 2021-06-20 DIAGNOSIS — J9601 Acute respiratory failure with hypoxia: Secondary | ICD-10-CM | POA: Diagnosis not present

## 2021-06-20 DIAGNOSIS — G9341 Metabolic encephalopathy: Secondary | ICD-10-CM | POA: Diagnosis not present

## 2021-06-20 DIAGNOSIS — J9690 Respiratory failure, unspecified, unspecified whether with hypoxia or hypercapnia: Secondary | ICD-10-CM | POA: Diagnosis not present

## 2021-06-20 DIAGNOSIS — R578 Other shock: Secondary | ICD-10-CM | POA: Diagnosis not present

## 2021-06-20 DIAGNOSIS — E1122 Type 2 diabetes mellitus with diabetic chronic kidney disease: Secondary | ICD-10-CM | POA: Diagnosis present

## 2021-06-20 DIAGNOSIS — E785 Hyperlipidemia, unspecified: Secondary | ICD-10-CM | POA: Diagnosis present

## 2021-06-20 DIAGNOSIS — I5023 Acute on chronic systolic (congestive) heart failure: Secondary | ICD-10-CM | POA: Diagnosis not present

## 2021-06-20 DIAGNOSIS — Z538 Procedure and treatment not carried out for other reasons: Secondary | ICD-10-CM | POA: Diagnosis not present

## 2021-06-20 DIAGNOSIS — R55 Syncope and collapse: Secondary | ICD-10-CM | POA: Diagnosis not present

## 2021-06-20 DIAGNOSIS — I4892 Unspecified atrial flutter: Secondary | ICD-10-CM | POA: Diagnosis present

## 2021-06-20 DIAGNOSIS — Z6841 Body Mass Index (BMI) 40.0 and over, adult: Secondary | ICD-10-CM | POA: Diagnosis not present

## 2021-06-20 DIAGNOSIS — I2609 Other pulmonary embolism with acute cor pulmonale: Secondary | ICD-10-CM | POA: Diagnosis not present

## 2021-06-20 LAB — IRON AND TIBC
Iron: 29 ug/dL (ref 28–170)
Saturation Ratios: 12 % (ref 10.4–31.8)
TIBC: 251 ug/dL (ref 250–450)
UIBC: 222 ug/dL

## 2021-06-20 LAB — CBC
HCT: 38.5 % (ref 36.0–46.0)
Hemoglobin: 12.3 g/dL (ref 12.0–15.0)
MCH: 24.1 pg — ABNORMAL LOW (ref 26.0–34.0)
MCHC: 31.9 g/dL (ref 30.0–36.0)
MCV: 75.5 fL — ABNORMAL LOW (ref 80.0–100.0)
Platelets: 181 10*3/uL (ref 150–400)
RBC: 5.1 MIL/uL (ref 3.87–5.11)
RDW: 17.9 % — ABNORMAL HIGH (ref 11.5–15.5)
WBC: 7.6 10*3/uL (ref 4.0–10.5)
nRBC: 0.4 % — ABNORMAL HIGH (ref 0.0–0.2)

## 2021-06-20 LAB — BASIC METABOLIC PANEL WITH GFR
Anion gap: 11 (ref 5–15)
BUN: 50 mg/dL — ABNORMAL HIGH (ref 8–23)
CO2: 25 mmol/L (ref 22–32)
Calcium: 9.3 mg/dL (ref 8.9–10.3)
Chloride: 101 mmol/L (ref 98–111)
Creatinine, Ser: 1.44 mg/dL — ABNORMAL HIGH (ref 0.44–1.00)
GFR, Estimated: 38 mL/min — ABNORMAL LOW
Glucose, Bld: 134 mg/dL — ABNORMAL HIGH (ref 70–99)
Potassium: 3.7 mmol/L (ref 3.5–5.1)
Sodium: 137 mmol/L (ref 135–145)

## 2021-06-20 LAB — SODIUM, URINE, RANDOM: Sodium, Ur: 34 mmol/L

## 2021-06-20 LAB — HEPARIN LEVEL (UNFRACTIONATED): Heparin Unfractionated: 0.68 [IU]/mL (ref 0.30–0.70)

## 2021-06-20 LAB — FERRITIN: Ferritin: 164 ng/mL (ref 11–307)

## 2021-06-20 LAB — GLUCOSE, CAPILLARY
Glucose-Capillary: 129 mg/dL — ABNORMAL HIGH (ref 70–99)
Glucose-Capillary: 100 mg/dL — ABNORMAL HIGH (ref 70–99)
Glucose-Capillary: 120 mg/dL — ABNORMAL HIGH (ref 70–99)
Glucose-Capillary: 124 mg/dL — ABNORMAL HIGH (ref 70–99)

## 2021-06-20 MED ORDER — ASPIRIN EC 81 MG PO TBEC
81.0000 mg | DELAYED_RELEASE_TABLET | Freq: Every day | ORAL | Status: DC
  Administered 2021-06-20 – 2021-06-22 (×2): 81 mg via ORAL
  Filled 2021-06-20 (×3): qty 1

## 2021-06-20 MED ORDER — VITAMIN D 25 MCG (1000 UNIT) PO TABS
5000.0000 [IU] | ORAL_TABLET | Freq: Every day | ORAL | Status: DC
  Administered 2021-06-20 – 2021-06-27 (×7): 5000 [IU] via ORAL
  Filled 2021-06-20 (×7): qty 5

## 2021-06-20 MED ORDER — SODIUM CHLORIDE 0.9 % IV SOLN: INTRAVENOUS | Status: DC

## 2021-06-20 MED ORDER — ROSUVASTATIN CALCIUM 5 MG PO TABS
5.0000 mg | ORAL_TABLET | Freq: Every day | ORAL | Status: DC
  Administered 2021-06-20 – 2021-06-27 (×7): 5 mg via ORAL
  Filled 2021-06-20 (×7): qty 1

## 2021-06-20 MED ORDER — DILTIAZEM HCL ER COATED BEADS 120 MG PO CP24
120.0000 mg | ORAL_CAPSULE | Freq: Every day | ORAL | Status: DC
  Administered 2021-06-20: 120 mg via ORAL
  Filled 2021-06-20 (×2): qty 1

## 2021-06-20 NOTE — Progress Notes (Signed)
ANTICOAGULATION CONSULT NOTE   Pharmacy Consult for Heparin Indication: atrial fibrillation  Allergies  Allergen Reactions   Bystolic [Nebivolol Hcl]     Bradycardia    Coreg [Carvedilol]     bradycardia   Minoxidil     Abdominal pain    Patient Measurements: Height: 5\' 7"  (170.2 cm) Weight: 121 kg (266 lb 12.1 oz) IBW/kg (Calculated) : 61.6 Heparin Dosing Weight: 90 kg  Vital Signs: Temp: 98 F (36.7 C) (10/01 2330) Temp Source: Oral (10/01 2330) BP: 112/98 (10/02 0000) Pulse Rate: 101 (10/02 0000)  Labs: Recent Labs    06/19/21 1145 06/19/21 1411 06/20/21 0045  HGB 13.6  --  12.3  HCT 42.2  --  38.5  PLT 189  --  181  HEPARINUNFRC  --   --  0.68  CREATININE 1.45*  --  1.44*  TROPONINIHS 49* 46*  --      Estimated Creatinine Clearance: 46.2 mL/min (A) (by C-G formula based on SCr of 1.44 mg/dL (H)).   Medical History: Past Medical History:  Diagnosis Date   Essential hypertension 11/23/2018   Obesity 11/23/2018   Stroke [facility])       Assessment: 75 yo W with afib RVR. No anticoagulation prior to admission. Pharmacy consulted for heparin.    Heparin level therapeutic (0.58) on gtt at 1250 units/hr. No bleeding noted.  Goal of Therapy:  Heparin level 0.3-0.7 units/ml Monitor platelets by anticoagulation protocol: Yes   Plan:  Continue heparin infustion at 1250 units/hr F/u 6hr confirmatory heparin level  66, PharmD, BCPS Please see amion for complete clinical pharmacist phone list 06/20/2021 1:46 AM

## 2021-06-20 NOTE — Plan of Care (Signed)
  Problem: Education: Goal: Understanding of medication regimen will improve Outcome: Progressing   Problem: Health Behavior/Discharge Planning: Goal: Ability to safely manage health-related needs after discharge will improve Outcome: Progressing

## 2021-06-20 NOTE — Progress Notes (Signed)
ANTICOAGULATION CONSULT NOTE   Pharmacy Consult for Heparin Indication: atrial fibrillation  Allergies  Allergen Reactions   Bystolic [Nebivolol Hcl]     Bradycardia    Coreg [Carvedilol]     bradycardia   Minoxidil     Abdominal pain    Patient Measurements: Height: 5\' 7"  (170.2 cm) Weight: 121 kg (266 lb 12.1 oz) IBW/kg (Calculated) : 61.6 Heparin Dosing Weight: 90 kg  Vital Signs: Temp: 98 F (36.7 C) (10/02 1131) Temp Source: Oral (10/02 1131) BP: 122/97 (10/02 1131) Pulse Rate: 78 (10/02 1131)  Labs: Recent Labs    06/19/21 1145 06/19/21 1411 06/20/21 0045  HGB 13.6  --  12.3  HCT 42.2  --  38.5  PLT 189  --  181  HEPARINUNFRC  --   --  0.68  CREATININE 1.45*  --  1.44*  TROPONINIHS 49* 46*  --      Estimated Creatinine Clearance: 46.2 mL/min (A) (by C-G formula based on SCr of 1.44 mg/dL (H)).   Medical History: Past Medical History:  Diagnosis Date   Essential hypertension 11/23/2018   Obesity 11/23/2018   Stroke [facility])       Assessment: 75 yo W with afib RVR. No anticoagulation prior to admission. Pharmacy consulted for heparin.    Heparin level was drawn ~5 hrs after bolus and at high end of therapeutic (0.68) on gtt at 1250 units/hr. Unable to get repeat level today d/t hard stick and attempted x3. Last level was not at steady state. No reported bleeding. Will empirically decrease by 50 units/hr to keep in goal.   Goal of Therapy:  Heparin level 0.3-0.7 units/ml Monitor platelets by anticoagulation protocol: Yes   Plan:  Decrease heparin infustion to 1200 units/hr Monitor daily HL, CBC/plt Monitor for signs/symptoms of bleeding F/u if DCCV 10/3    66, PharmD, BCPS, [facility] Clinical Pharmacist  Please check AMION for all [facility] Pharmacy phone numbers After 10:00 PM, call Main Pharmacy [phone]

## 2021-06-20 NOTE — Progress Notes (Signed)
Pt admitted with A.fib RVR. MD aware of pt's HR. Plan for TEE/cardioversion tomorrow.

## 2021-06-20 NOTE — Progress Notes (Addendum)
ANTICOAGULATION CONSULT NOTE   Pharmacy Consult for Heparin Indication: atrial fibrillation  Allergies  Allergen Reactions   Bystolic [Nebivolol Hcl]     Bradycardia    Coreg [Carvedilol]     bradycardia   Minoxidil     Abdominal pain    Patient Measurements: Height: 5\' 7"  (170.2 cm) Weight: 121 kg (266 lb 12.1 oz) IBW/kg (Calculated) : 61.6 Heparin Dosing Weight: 90 kg  Vital Signs: Temp: 98 F (36.7 C) (10/02 1131) Temp Source: Oral (10/02 1131) BP: 122/97 (10/02 1131) Pulse Rate: 78 (10/02 1131)  Labs: Recent Labs    06/19/21 1145 06/19/21 1411 06/20/21 0045  HGB 13.6  --  12.3  HCT 42.2  --  38.5  PLT 189  --  181  HEPARINUNFRC  --   --  0.68  CREATININE 1.45*  --  1.44*  TROPONINIHS 49* 46*  --      Estimated Creatinine Clearance: 46.2 mL/min (A) (by C-G formula based on SCr of 1.44 mg/dL (H)).   Medical History: Past Medical History:  Diagnosis Date   Essential hypertension 11/23/2018   Obesity 11/23/2018   Stroke [facility])       Assessment: 75 yo W with afib RVR. No anticoagulation prior to admission. Pharmacy consulted for heparin.    Heparin level therapeutic (0.58) on gtt at 1250 units/hr. No bleeding noted.Repeat level - lab unable to get blood from patient (hard stick). Continued gtt at current rate given above and no symptoms of bleeding per RN.   Scheduled another heparin level tonight so lab will try and redraw given potential for DCCV 10/3.   Goal of Therapy:  Heparin level 0.3-0.7 units/ml Monitor platelets by anticoagulation protocol: Yes   Plan:  Continue heparin infustion at 1250 units/hr F/u 6hr confirmatory heparin level Daily CBC and HL  66, PharmD PGY2 Cardiology Pharmacy Resident [phone]  06/20/2021  2:46 PM Please check [email or web address] for unit-specific pharmacy phone numbers.

## 2021-06-20 NOTE — Progress Notes (Signed)
PROGRESS NOTE    Ashley [name]  QMV:[id number] DOB: [birth year] DOA: 06/19/2021 PCP: Merri Brunette, MD    Brief Narrative:  75 year old female with history of hypertension, hyperlipidemia, type 2 diabetes, atrial tachycardia, cryptogenic stroke presented to ER with 2 weeks of feeling fatigue, leg swelling and shortness of breath.  In the emergency room, she was found with heart rate of 172 and on A. fib.  She does see cardiology as outpatient with history of atrial tachycardia and intolerance of beta-blocker that caused bradycardia.  Started on Cardizem drip, changed to amiodarone with previous bradycardia, heparin infusion and admitted to the hospital.   Assessment & Plan:   Principal Problem:   New onset atrial fibrillation (HCC) Active Problems:   Essential hypertension   Controlled diabetes mellitus type II without complication (HCC)   Elevated troponin   AKI (acute kidney injury) (HCC)   Microcytosis  New onset rapid A. fib: With history of atrial tachycardia and bradycardia due to beta-blockers. Persistent A. fib. Continue amiodarone, metoprolol IV every 6 hours as needed, avoiding oral metoprolol or Cardizem. Continue heparin infusion. Followed by cardiology.  She will probably need cardioversion, may do elective cardioversion tomorrow as per cardiology.  Suspected diastolic heart failure/probably aggravated by rapid heart rate. Patient presents with new onset leg edema and shortness of breath.  Intermittent doses of Lasix given.  Echocardiogram pending.  Acute kidney injury: Suspect low perfusion due to rapid A. fib.  Intermittent diuretics.  Close monitoring renal functions.  Urine output is adequate.  Holding hydrochlorothiazide and aspirin lactone.  Type 2 diabetes without complications: Diet controlled.  Sliding scale in the hospital.  History of stroke: No new neurological deficit.  On aspirin and statin.  Now on heparin.    DVT prophylaxis: Place and  maintain sequential compression device Start: 06/19/21 1509   Code Status: Full code Family Communication: Niece on the phone Disposition Plan: Status is: Observation  The patient will require care spanning > 2 midnights and should be moved to inpatient because: Hemodynamically unstable and Inpatient level of care appropriate due to severity of illness  Dispo: The patient is from: Home              Anticipated d/c is to: Home              Patient currently is not medically stable to d/c.   Difficult to place patient No         Consultants:  Cardiology  Procedures:  None  Antimicrobials:  None   Subjective: Patient seen and examined.  Overnight remained tachycardic even on amiodarone infusion.  She does deny chest pain or shortness of breath.  Denies any dizziness lightheadedness. Not mobilized because of persistent high heart rate.  Objective: Vitals:   06/19/21 2330 06/20/21 0000 06/20/21 0546 06/20/21 0810  BP: 101/83 (!) 112/98 (!) 140/98 136/86  Pulse: 70 (!) 101 99 (!) 127  Resp: 20 (!) 25 19 (!) 25  Temp: 98 F (36.7 C)  97.6 F (36.4 C) 97.6 F (36.4 C)  TempSrc: Oral  Axillary Oral  SpO2: 97% 96% 93% 96%  Weight:   121 kg   Height:        Intake/Output Summary (Last 24 hours) at 06/20/2021 1117 Last data filed at 06/20/2021 0612 Gross per 24 hour  Intake 749.71 ml  Output 550 ml  Net 199.71 ml   Filed Weights   06/19/21 1800 06/20/21 0546  Weight: 121 kg 121 kg    Examination:  General exam: Appears calm and comfortable Looks fairly comfortable. Respiratory system: Clear to auscultation. Respiratory effort normal.  No added sounds. Cardiovascular system: S1 & S2 heard, RRR. No JVD, murmurs, rubs, gallops or clicks.  2+ bilateral pedal edema.   Gastrointestinal system: Abdomen is nondistended, soft and nontender. No organomegaly or masses felt. Normal bowel sounds heard. Central nervous system: Alert and oriented. No focal neurological  deficits. Extremities: Symmetric 5 x 5 power. Skin: No rashes, lesions or ulcers Psychiatry: Judgement and insight appear normal. Mood & affect appropriate.     Data Reviewed: I have personally reviewed following labs and imaging studies  CBC: Recent Labs  Lab 06/19/21 1145 06/20/21 0045  WBC 6.7 7.6  NEUTROABS 4.6  --   HGB 13.6 12.3  HCT 42.2 38.5  MCV 75.5* 75.5*  PLT 189 181   Basic Metabolic Panel: Recent Labs  Lab 06/19/21 1145 06/20/21 0045  NA 139 137  K 3.8 3.7  CL 100 101  CO2 25 25  GLUCOSE 149* 134*  BUN 55* 50*  CREATININE 1.45* 1.44*  CALCIUM 10.1 9.3  MG 2.2  --    GFR: Estimated Creatinine Clearance: 46.2 mL/min (A) (by C-G formula based on SCr of 1.44 mg/dL (H)). Liver Function Tests: Recent Labs  Lab 06/19/21 1145  AST 31  ALT 36  ALKPHOS 89  BILITOT 1.8*  PROT 7.1  ALBUMIN 3.7   No results for input(s): LIPASE, AMYLASE in the last 168 hours. Recent Labs  Lab 06/19/21 1255  AMMONIA 27   Coagulation Profile: No results for input(s): INR, PROTIME in the last 168 hours. Cardiac Enzymes: No results for input(s): CKTOTAL, CKMB, CKMBINDEX, TROPONINI in the last 168 hours. BNP (last 3 results) No results for input(s): PROBNP in the last 8760 hours. HbA1C: No results for input(s): HGBA1C in the last 72 hours. CBG: Recent Labs  Lab 06/19/21 1845 06/19/21 2148 06/20/21 0636  GLUCAP 185* 149* 129*   Lipid Profile: No results for input(s): CHOL, HDL, LDLCALC, TRIG, CHOLHDL, LDLDIRECT in the last 72 hours. Thyroid Function Tests: Recent Labs    06/19/21 1229  TSH 1.412   Anemia Panel: Recent Labs    06/20/21 0045  FERRITIN 164  TIBC 251  IRON 29   Sepsis Labs: No results for input(s): PROCALCITON, LATICACIDVEN in the last 168 hours.  Recent Results (from the past 240 hour(s))  Resp Panel by RT-PCR (Flu A&B, Covid) Nasopharyngeal Swab     Status: None   Collection Time: 06/19/21 12:29 PM   Specimen: Nasopharyngeal Swab;  Nasopharyngeal(NP) swabs in vial transport medium  Result Value Ref Range Status   SARS Coronavirus 2 by RT PCR NEGATIVE NEGATIVE Final    Comment: (NOTE) SARS-CoV-2 target nucleic acids are NOT DETECTED.  The SARS-CoV-2 RNA is generally detectable in upper respiratory specimens during the acute phase of infection. The lowest concentration of SARS-CoV-2 viral copies this assay can detect is 138 copies/mL. A negative result does not preclude SARS-Cov-2 infection and should not be used as the sole basis for treatment or other patient management decisions. A negative result may occur with  improper specimen collection/handling, submission of specimen other than nasopharyngeal swab, presence of viral mutation(s) within the areas targeted by this assay, and inadequate number of viral copies(<138 copies/mL). A negative result must be combined with clinical observations, patient history, and epidemiological information. The expected result is Negative.  Fact Sheet for Patients:  BloggerCourse.com  Fact Sheet for Healthcare Providers:  SeriousBroker.it  This test is  no t yet approved or cleared by the [place] and  has been authorized for detection and/or diagnosis of SARS-CoV-2 by FDA under an Emergency Use Authorization (EUA). This EUA will remain  in effect (meaning this test can be used) for the duration of the COVID-19 declaration under Section 564(b)(1) of the Act, 21 U.S.C.section 360bbb-3(b)(1), unless the authorization is terminated  or revoked sooner.       Influenza A by PCR NEGATIVE NEGATIVE Final   Influenza B by PCR NEGATIVE NEGATIVE Final    Comment: (NOTE) The Xpert Xpress SARS-CoV-2/FLU/RSV plus assay is intended as an aid in the diagnosis of influenza from Nasopharyngeal swab specimens and should not be used as a sole basis for treatment. Nasal washings and aspirates are unacceptable for Xpert Xpress  SARS-CoV-2/FLU/RSV testing.  Fact Sheet for Patients: BloggerCourse.com  Fact Sheet for Healthcare Providers: SeriousBroker.it  This test is not yet approved or cleared by the [place] FDA and has been authorized for detection and/or diagnosis of SARS-CoV-2 by FDA under an Emergency Use Authorization (EUA). This EUA will remain in effect (meaning this test can be used) for the duration of the COVID-19 declaration under Section 564(b)(1) of the Act, 21 U.S.C. section 360bbb-3(b)(1), unless the authorization is terminated or revoked.  Performed at [facility], [address], [place], [place] [address]          Radiology Studies: DG Chest Portable 1 View  Result Date: 06/19/2021 CLINICAL DATA:  New A. fib with RVR, new edema, worsening exertional shortness of breath rales on exam. EXAM: PORTABLE CHEST 1 VIEW COMPARISON:  None. FINDINGS: Cardiomegaly. No significant pleural effusion. No pneumothorax. No focal consolidation appreciated. No overt pulmonary edema. No acute osseous abnormality. IMPRESSION: Cardiomegaly without overt pulmonary edema. No significant effusion or consolidative process in the lungs. Electronically Signed   By: Olive Bass M.D.   On: 06/19/2021 13:05        Scheduled Meds:  aspirin EC  81 mg Oral Daily   cholecalciferol  5,000 Units Oral Daily   diltiazem  120 mg Oral Daily   furosemide  40 mg Intravenous Q12H   insulin aspart  0-9 Units Subcutaneous TID WC   rosuvastatin  5 mg Oral Daily   sodium chloride flush  3 mL Intravenous Q12H   Continuous Infusions:  sodium chloride     amiodarone 30 mg/hr (06/20/21 0612)   heparin 1,250 Units/hr (06/20/21 0612)     LOS: 0 days    Time spent: 34 minutes    Dorcas Carrow, MD [facility] Pager [phone]

## 2021-06-20 NOTE — Progress Notes (Addendum)
Subjective:  Feels tired Not much appetite  Objective:  Vital Signs in the last 24 hours: Temp:  [97.3 F (36.3 C)-98 F (36.7 C)] 98 F (36.7 C) (10/02 1131) Pulse Rate:  [70-172] 78 (10/02 1131) Resp:  [17-26] 22 (10/02 1131) BP: (101-153)/(72-109) 122/97 (10/02 1131) SpO2:  [90 %-100 %] 94 % (10/02 1131) Weight:  [097 kg] 121 kg (10/02 0546)  Intake/Output from previous day: 10/01 0701 - 10/02 0700 In: 749.7 [I.V.:749.7] Out: 550 [Urine:550]  Physical Exam Vitals and nursing note reviewed.  Constitutional:      General: She is not in acute distress.    Appearance: She is well-developed.  HENT:     Head: Normocephalic and atraumatic.  Eyes:     Conjunctiva/sclera: Conjunctivae normal.     Pupils: Pupils are equal, round, and reactive to light.  Neck:     Vascular: JVD present.  Cardiovascular:     Rate and Rhythm: Tachycardia present. Rhythm irregular.     Pulses: Normal pulses and intact distal pulses.     Heart sounds: No murmur heard. Pulmonary:     Effort: Pulmonary effort is normal.     Breath sounds: Examination of the right-lower field reveals rales. Rales present. No wheezing.  Abdominal:     General: Bowel sounds are normal.     Palpations: Abdomen is soft.     Tenderness: There is no rebound.  Musculoskeletal:        General: No tenderness. Normal range of motion.     Right lower leg: Edema (2+\) present.     Left lower leg: Edema (2+) present.  Lymphadenopathy:     Cervical: No cervical adenopathy.  Skin:    General: Skin is warm and dry.  Neurological:     Mental Status: She is alert and oriented to person, place, and time.     Cranial Nerves: No cranial nerve deficit.   Rate slightly slower than 10/1, otherwise no change in physical exam  Lab Results: BMP Recent Labs    06/19/21 1145 06/20/21 0045  NA 139 137  K 3.8 3.7  CL 100 101  CO2 25 25  GLUCOSE 149* 134*  BUN 55* 50*  CREATININE 1.45* 1.44*  CALCIUM 10.1 9.3  GFRNONAA 38*  38*    CBC Recent Labs  Lab 06/19/21 1145 06/20/21 0045  WBC 6.7 7.6  RBC 5.59* 5.10  HGB 13.6 12.3  HCT 42.2 38.5  PLT 189 181  MCV 75.5* 75.5*  MCH 24.3* 24.1*  MCHC 32.2 31.9  RDW 18.7* 17.9*  LYMPHSABS 1.5  --   MONOABS 0.6  --   EOSABS 0.0  --   BASOSABS 0.0  --     HEMOGLOBIN A1C Lab Results  Component Value Date   HGBA1C 6.2 (H) 04/04/2020   MPG 131.24 04/04/2020    Cardiac Panel (last 3 results) No results for input(s): CKTOTAL, CKMB, TROPONINI, RELINDX in the last 8760 hours.  BNP (last 3 results) Recent Labs    06/19/21 1145  BNP 958.1*    TSH Recent Labs    06/19/21 1229  TSH 1.412    Lipid Panel     Component Value Date/Time   CHOL 163 04/05/2020 0334   TRIG 64 04/05/2020 0334   HDL 63 04/05/2020 0334   CHOLHDL 2.6 04/05/2020 0334   VLDL 13 04/05/2020 0334   LDLCALC 87 04/05/2020 0334     Hepatic Function Panel Recent Labs    06/19/21 1145  PROT 7.1  ALBUMIN  3.7  AST 31  ALT 36  ALKPHOS 89  BILITOT 1.8*    EKG 06/19/2021: A. fib with RVR, nonspecific T wave changes   Echocardiogram 04/05/2020:  1. Left ventricular ejection fraction, by estimation, is 55 to 60%. The  left ventricle has normal function. The left ventricle has no regional  wall motion abnormalities. There is mild left ventricular hypertrophy.  Left ventricular diastolic parameters  are indeterminate.   2. Right ventricular systolic function is normal. The right ventricular  size is normal.   3. Left atrial size was moderately dilated.   4. Nosignificant valvular abnormality.   5. Agitated saline contrast bubble study was negative, with no evidence  of any interatrial shunt.        Assessment & Recommendations:     75 y.o. African American female  with hypertension, type 2 DM, obesity, h/o atrial tachycardia, h/o stroke (03/2020), now admitted with Afib RVR   A. fib with RVR: Remains in 140s on IV amiodarone Added diltiazem PO 120 mg daily. If not  controlled/converted by 10/3, will plan on performing TEE/cardioversion I will prefer complete TTE after she is converted or at least better rate controlle.d Will get an idea of EF on TEE. CHA2DS2VASc score 6, annual stroke risk 9%. Continue IV heparin for now. Will eventually need PO anticoagulation. Also recommend IV Lasix 40 mg twice daily. I anticipate her creatinine may increase further, with aggressive diuresis.  However, in the long run, I do expect the creatinine to improve with adequate diuresis. No acute indication for emergent cardioversion, as her blood pressure is stable  Elevated troponin: Secondary to Afib with RVR and possible systolic heart failure Echocardiogram pending   Hypertension: Controlled     Elder Negus, MD Pager: [phone] Office: [phone]

## 2021-06-20 NOTE — Plan of Care (Signed)
?  Problem: Education: ?Goal: Knowledge of disease or condition will improve ?Outcome: Progressing ?Goal: Understanding of medication regimen will improve ?Outcome: Progressing ?Goal: Individualized Educational Video(s) ?Outcome: Progressing ?  ?Problem: Activity: ?Goal: Ability to tolerate increased activity will improve ?Outcome: Progressing ?  ?Problem: Cardiac: ?Goal: Ability to achieve and maintain adequate cardiopulmonary perfusion will improve ?Outcome: Progressing ?  ?

## 2021-06-21 ENCOUNTER — Other Ambulatory Visit (HOSPITAL_COMMUNITY): Payer: Medicare Other

## 2021-06-21 ENCOUNTER — Inpatient Hospital Stay (HOSPITAL_COMMUNITY): Payer: Medicare Other

## 2021-06-21 ENCOUNTER — Other Ambulatory Visit (HOSPITAL_COMMUNITY): Payer: Self-pay

## 2021-06-21 ENCOUNTER — Encounter (HOSPITAL_COMMUNITY): Payer: Self-pay | Admitting: Internal Medicine

## 2021-06-21 ENCOUNTER — Encounter (HOSPITAL_COMMUNITY)
Admission: EM | Disposition: A | Discharge status: Home Health | Payer: Self-pay | Source: Home / Self Care | Attending: Internal Medicine

## 2021-06-21 ENCOUNTER — Inpatient Hospital Stay (HOSPITAL_COMMUNITY): Payer: Medicare Other | Admitting: Certified Registered Nurse Anesthetist

## 2021-06-21 ENCOUNTER — Inpatient Hospital Stay: Payer: Self-pay

## 2021-06-21 DIAGNOSIS — I4891 Unspecified atrial fibrillation: Secondary | ICD-10-CM

## 2021-06-21 HISTORY — PX: TEE WITHOUT CARDIOVERSION: SHX5443

## 2021-06-21 HISTORY — PX: CARDIOVERSION: SHX1299

## 2021-06-21 LAB — POCT I-STAT 7, (LYTES, BLD GAS, ICA,H+H)
Acid-Base Excess: 1 mmol/L (ref 0.0–2.0)
Acid-base deficit: 3 mmol/L — ABNORMAL HIGH (ref 0.0–2.0)
Bicarbonate: 23.8 mmol/L (ref 20.0–28.0)
Bicarbonate: 25.6 mmol/L (ref 20.0–28.0)
Calcium, Ion: 1.23 mmol/L (ref 1.15–1.40)
Calcium, Ion: 1.21 mmol/L (ref 1.15–1.40)
HCT: 43 % (ref 36.0–46.0)
HCT: 43 % (ref 36.0–46.0)
Hemoglobin: 14.6 g/dL (ref 12.0–15.0)
Hemoglobin: 14.6 g/dL (ref 12.0–15.0)
O2 Saturation: 99 %
O2 Saturation: 100 %
Patient temperature: 36.9
Potassium: 3.5 mmol/L (ref 3.5–5.1)
Potassium: 3.4 mmol/L — ABNORMAL LOW (ref 3.5–5.1)
Sodium: 140 mmol/L (ref 135–145)
Sodium: 139 mmol/L (ref 135–145)
TCO2: 25 mmol/L (ref 22–32)
TCO2: 27 mmol/L (ref 22–32)
pCO2 arterial: 47.4 mmHg (ref 32.0–48.0)
pCO2 arterial: 41.3 mmHg (ref 32.0–48.0)
pH, Arterial: 7.308 — ABNORMAL LOW (ref 7.350–7.450)
pH, Arterial: 7.4 (ref 7.350–7.450)
pO2, Arterial: 155 mmHg — ABNORMAL HIGH (ref 83.0–108.0)
pO2, Arterial: 270 mmHg — ABNORMAL HIGH (ref 83.0–108.0)

## 2021-06-21 LAB — BASIC METABOLIC PANEL
Anion gap: 13 (ref 5–15)
Anion gap: 12 (ref 5–15)
BUN: 45 mg/dL — ABNORMAL HIGH (ref 8–23)
BUN: 44 mg/dL — ABNORMAL HIGH (ref 8–23)
CO2: 26 mmol/L (ref 22–32)
CO2: 22 mmol/L (ref 22–32)
Calcium: 9.3 mg/dL (ref 8.9–10.3)
Calcium: 8.7 mg/dL — ABNORMAL LOW (ref 8.9–10.3)
Chloride: 99 mmol/L (ref 98–111)
Chloride: 101 mmol/L (ref 98–111)
Creatinine, Ser: 1.44 mg/dL — ABNORMAL HIGH (ref 0.44–1.00)
Creatinine, Ser: 1.32 mg/dL — ABNORMAL HIGH (ref 0.44–1.00)
GFR, Estimated: 38 mL/min — ABNORMAL LOW (ref >60)
GFR, Estimated: 42 mL/min — ABNORMAL LOW (ref >60)
Glucose, Bld: 121 mg/dL — ABNORMAL HIGH (ref 70–99)
Glucose, Bld: 150 mg/dL — ABNORMAL HIGH (ref 70–99)
Potassium: 3.6 mmol/L (ref 3.5–5.1)
Potassium: 3.2 mmol/L — ABNORMAL LOW (ref 3.5–5.1)
Sodium: 138 mmol/L (ref 135–145)
Sodium: 135 mmol/L (ref 135–145)

## 2021-06-21 LAB — URINE CULTURE

## 2021-06-21 LAB — MRSA NEXT GEN BY PCR, NASAL: MRSA by PCR Next Gen: NOT DETECTED

## 2021-06-21 LAB — GLUCOSE, CAPILLARY
Glucose-Capillary: 139 mg/dL — ABNORMAL HIGH (ref 70–99)
Glucose-Capillary: 129 mg/dL — ABNORMAL HIGH (ref 70–99)
Glucose-Capillary: 130 mg/dL — ABNORMAL HIGH (ref 70–99)
Glucose-Capillary: 118 mg/dL — ABNORMAL HIGH (ref 70–99)

## 2021-06-21 LAB — LACTIC ACID, PLASMA
Lactic Acid, Venous: 2.1 mmol/L (ref 0.5–1.9)
Lactic Acid, Venous: 2 mmol/L (ref 0.5–1.9)

## 2021-06-21 LAB — CBC
HCT: 39.5 % (ref 36.0–46.0)
HCT: 36 % (ref 36.0–46.0)
HCT: 37 % (ref 36.0–46.0)
Hemoglobin: 12.9 g/dL (ref 12.0–15.0)
Hemoglobin: 11.7 g/dL — ABNORMAL LOW (ref 12.0–15.0)
Hemoglobin: 12.1 g/dL (ref 12.0–15.0)
MCH: 24.3 pg — ABNORMAL LOW (ref 26.0–34.0)
MCH: 24.4 pg — ABNORMAL LOW (ref 26.0–34.0)
MCH: 24.4 pg — ABNORMAL LOW (ref 26.0–34.0)
MCHC: 32.7 g/dL (ref 30.0–36.0)
MCHC: 32.5 g/dL (ref 30.0–36.0)
MCHC: 32.7 g/dL (ref 30.0–36.0)
MCV: 74.4 fL — ABNORMAL LOW (ref 80.0–100.0)
MCV: 75 fL — ABNORMAL LOW (ref 80.0–100.0)
MCV: 74.6 fL — ABNORMAL LOW (ref 80.0–100.0)
Platelets: 201 10*3/uL (ref 150–400)
Platelets: 201 10*3/uL (ref 150–400)
Platelets: 192 10*3/uL (ref 150–400)
RBC: 5.31 MIL/uL — ABNORMAL HIGH (ref 3.87–5.11)
RBC: 4.8 MIL/uL (ref 3.87–5.11)
RBC: 4.96 MIL/uL (ref 3.87–5.11)
RDW: 18.1 % — ABNORMAL HIGH (ref 11.5–15.5)
RDW: 18 % — ABNORMAL HIGH (ref 11.5–15.5)
RDW: 17.6 % — ABNORMAL HIGH (ref 11.5–15.5)
WBC: 7.2 10*3/uL (ref 4.0–10.5)
WBC: 8 10*3/uL (ref 4.0–10.5)
WBC: 8.1 10*3/uL (ref 4.0–10.5)
nRBC: 0.7 % — ABNORMAL HIGH (ref 0.0–0.2)
nRBC: 0.4 % — ABNORMAL HIGH (ref 0.0–0.2)
nRBC: 0.4 % — ABNORMAL HIGH (ref 0.0–0.2)

## 2021-06-21 LAB — PROTIME-INR
INR: 1.6 — ABNORMAL HIGH (ref 0.8–1.2)
INR: 1.7 — ABNORMAL HIGH (ref 0.8–1.2)
Prothrombin Time: 18.9 seconds — ABNORMAL HIGH (ref 11.4–15.2)
Prothrombin Time: 20.2 seconds — ABNORMAL HIGH (ref 11.4–15.2)

## 2021-06-21 LAB — MAGNESIUM: Magnesium: 1.9 mg/dL (ref 1.7–2.4)

## 2021-06-21 LAB — TROPONIN I (HIGH SENSITIVITY): Troponin I (High Sensitivity): 62 ng/L — ABNORMAL HIGH (ref <18)

## 2021-06-21 LAB — HEPARIN LEVEL (UNFRACTIONATED)
Heparin Unfractionated: >1.1 IU/mL — ABNORMAL HIGH (ref 0.30–0.70)
Heparin Unfractionated: 0.42 IU/mL (ref 0.30–0.70)

## 2021-06-21 LAB — APTT
aPTT: 55 seconds — ABNORMAL HIGH (ref 24–36)
aPTT: 39 seconds — ABNORMAL HIGH (ref 24–36)

## 2021-06-21 LAB — SODIUM, URINE, RANDOM: Sodium, Ur: 15 mmol/L

## 2021-06-21 SURGERY — ECHOCARDIOGRAM, TRANSESOPHAGEAL
Anesthesia: General

## 2021-06-21 MED ORDER — SODIUM CHLORIDE 0.9 % IV SOLN: 250.0000 mL | Freq: Once | INTRAVENOUS | Status: DC

## 2021-06-21 MED ORDER — FENTANYL CITRATE PF 50 MCG/ML IJ SOSY
25.0000 ug | PREFILLED_SYRINGE | INTRAMUSCULAR | Status: DC | PRN
  Administered 2021-06-21: 50 ug via INTRAVENOUS
  Filled 2021-06-21: qty 1

## 2021-06-21 MED ORDER — SODIUM CHLORIDE 0.9% FLUSH: 10.0000 mL | INTRAVENOUS | Status: DC | PRN

## 2021-06-21 MED ORDER — PROPOFOL 500 MG/50ML IV EMUL
INTRAVENOUS | Status: DC | PRN
  Administered 2021-06-21: 100 ug/kg/min via INTRAVENOUS

## 2021-06-21 MED ORDER — POLYETHYLENE GLYCOL 3350 17 G PO PACK
17.0000 g | PACK | Freq: Every day | ORAL | Status: DC
  Administered 2021-06-22: 17 g
  Filled 2021-06-21: qty 1

## 2021-06-21 MED ORDER — LIDOCAINE 2% (20 MG/ML) 5 ML SYRINGE
INTRAMUSCULAR | Status: DC | PRN
  Administered 2021-06-21: 40 mg via INTRAVENOUS

## 2021-06-21 MED ORDER — PROPOFOL 10 MG/ML IV BOLUS
INTRAVENOUS | Status: DC | PRN
  Administered 2021-06-21: 30 mg via INTRAVENOUS

## 2021-06-21 MED ORDER — SODIUM CHLORIDE 0.9% FLUSH
10.0000 mL | Freq: Two times a day (BID) | INTRAVENOUS | Status: DC
  Administered 2021-06-22: 20 mL
  Administered 2021-06-22 – 2021-06-25 (×7): 10 mL

## 2021-06-21 MED ORDER — POTASSIUM CHLORIDE 20 MEQ PO PACK
40.0000 meq | PACK | Freq: Once | ORAL | Status: DC
  Administered 2021-06-21: 40 meq via ORAL
  Filled 2021-06-21: qty 2

## 2021-06-21 MED ORDER — SODIUM CHLORIDE 0.9 % IV SOLN
12.5000 mg | Freq: Once | INTRAVENOUS | Status: DC
  Filled 2021-06-21: qty 0.5

## 2021-06-21 MED ORDER — NOREPINEPHRINE 4 MG/250ML-% IV SOLN
INTRAVENOUS | Status: DC | PRN
  Administered 2021-06-21: 4 ug/min via INTRAVENOUS

## 2021-06-21 MED ORDER — SODIUM CHLORIDE 0.9 % IV SOLN: INTRAVENOUS | Status: DC | PRN

## 2021-06-21 MED ORDER — FENTANYL CITRATE PF 50 MCG/ML IJ SOSY: 25.0000 ug | PREFILLED_SYRINGE | INTRAMUSCULAR | Status: DC | PRN

## 2021-06-21 MED ORDER — FENTANYL CITRATE (PF) 100 MCG/2ML IJ SOLN: 25.0000 ug | INTRAMUSCULAR | Status: DC | PRN

## 2021-06-21 MED ORDER — PANTOPRAZOLE SODIUM 40 MG IV SOLR
40.0000 mg | Freq: Every day | INTRAVENOUS | Status: DC
  Administered 2021-06-22: 40 mg via INTRAVENOUS
  Filled 2021-06-21: qty 40

## 2021-06-21 MED ORDER — PANTOPRAZOLE SODIUM 40 MG IV SOLR
40.0000 mg | Freq: Two times a day (BID) | INTRAVENOUS | Status: DC
  Administered 2021-06-22 (×3): 40 mg via INTRAVENOUS
  Filled 2021-06-21 (×4): qty 40

## 2021-06-21 MED ORDER — HEPARIN (PORCINE) 25000 UT/250ML-% IV SOLN
1000.0000 [IU]/h | INTRAVENOUS | Status: DC
  Administered 2021-06-21: 650 [IU]/h via INTRAVENOUS
  Administered 2021-06-22: 800 [IU]/h via INTRAVENOUS
  Administered 2021-06-22: 650 [IU]/h via INTRAVENOUS
  Filled 2021-06-21 (×2): qty 250

## 2021-06-21 MED ORDER — PROPOFOL 1000 MG/100ML IV EMUL
0.0000 ug/kg/min | INTRAVENOUS | Status: DC
  Administered 2021-06-21: 25 ug/kg/min via INTRAVENOUS
  Administered 2021-06-21: 5 ug/kg/min via INTRAVENOUS
  Administered 2021-06-22: 25 ug/kg/min via INTRAVENOUS
  Administered 2021-06-22: 20 ug/kg/min via INTRAVENOUS
  Filled 2021-06-21 (×3): qty 100

## 2021-06-21 MED ORDER — ALTEPLASE (PULMONARY EMBOLISM) INFUSION
50.0000 mg | Freq: Once | INTRAVENOUS | Status: AC
  Administered 2021-06-21: 50 mg via INTRAVENOUS
  Filled 2021-06-21: qty 50

## 2021-06-21 MED ORDER — INSULIN ASPART 100 UNIT/ML IJ SOLN
0.0000 [IU] | INTRAMUSCULAR | Status: DC
  Administered 2021-06-21: 2 [IU] via SUBCUTANEOUS
  Administered 2021-06-22 (×3): 3 [IU] via SUBCUTANEOUS
  Administered 2021-06-23: 2 [IU] via SUBCUTANEOUS
  Administered 2021-06-23: 3 [IU] via SUBCUTANEOUS

## 2021-06-21 MED ORDER — NOREPINEPHRINE 4 MG/250ML-% IV SOLN
2.0000 ug/min | INTRAVENOUS | Status: DC
  Administered 2021-06-21: 2 ug/min via INTRAVENOUS

## 2021-06-21 MED ORDER — HEPARIN (PORCINE) 25000 UT/250ML-% IV SOLN: 800.0000 [IU]/h | INTRAVENOUS | Status: DC

## 2021-06-21 MED ORDER — SODIUM CHLORIDE 0.9 % IV SOLN
250.0000 mL | INTRAVENOUS | Status: DC
  Administered 2021-06-21 – 2021-06-22 (×2): 250 mL via INTRAVENOUS

## 2021-06-21 MED ORDER — ROCURONIUM BROMIDE 100 MG/10ML IV SOLN
INTRAVENOUS | Status: DC | PRN
  Administered 2021-06-21 (×2): 50 mg via INTRAVENOUS

## 2021-06-21 MED ORDER — CHLORHEXIDINE GLUCONATE CLOTH 2 % EX PADS
6.0000 | MEDICATED_PAD | Freq: Every day | CUTANEOUS | Status: DC
  Administered 2021-06-21 – 2021-06-26 (×6): 6 via TOPICAL

## 2021-06-21 MED ORDER — IOHEXOL 350 MG/ML SOLN
75.0000 mL | Freq: Once | INTRAVENOUS | Status: AC | PRN
  Administered 2021-06-21: 75 mL via INTRAVENOUS

## 2021-06-21 MED ORDER — PHENYLEPHRINE 40 MCG/ML (10ML) SYRINGE FOR IV PUSH (FOR BLOOD PRESSURE SUPPORT)
PREFILLED_SYRINGE | INTRAVENOUS | Status: DC | PRN
  Administered 2021-06-21 (×2): 80 ug via INTRAVENOUS
  Administered 2021-06-21 (×2): 200 ug via INTRAVENOUS

## 2021-06-21 MED ORDER — PHENYLEPHRINE HCL-NACL 20-0.9 MG/250ML-% IV SOLN: 0.0000 ug/min | INTRAVENOUS | Status: DC

## 2021-06-21 MED ORDER — POTASSIUM CHLORIDE 10 MEQ/100ML IV SOLN
10.0000 meq | INTRAVENOUS | Status: AC
Start: 2021-06-22 — End: 2021-06-22
  Administered 2021-06-22 (×2): 10 meq via INTRAVENOUS
  Filled 2021-06-21 (×2): qty 100

## 2021-06-21 MED ORDER — EPHEDRINE SULFATE-NACL 50-0.9 MG/10ML-% IV SOSY
PREFILLED_SYRINGE | INTRAVENOUS | Status: DC | PRN
  Administered 2021-06-21: 25 mg via INTRAVENOUS

## 2021-06-21 MED ORDER — ALTEPLASE (PULMONARY EMBOLISM) INFUSION
100.0000 mg | Freq: Once | INTRAVENOUS | Status: DC
  Filled 2021-06-21: qty 100

## 2021-06-21 MED ORDER — PHENYLEPHRINE CONCENTRATED 100MG/250ML (0.4 MG/ML) INFUSION SIMPLE
25.0000 ug/min | INTRAVENOUS | Status: DC
  Administered 2021-06-22: 35 ug/min via INTRAVENOUS
  Filled 2021-06-21: qty 250

## 2021-06-21 MED ORDER — PHENYLEPHRINE HCL-NACL 20-0.9 MG/250ML-% IV SOLN
25.0000 ug/min | INTRAVENOUS | Status: DC
  Administered 2021-06-21: 25 ug/min via INTRAVENOUS

## 2021-06-21 MED ORDER — DOCUSATE SODIUM 50 MG/5ML PO LIQD
100.0000 mg | Freq: Two times a day (BID) | ORAL | Status: DC
  Administered 2021-06-21 – 2021-06-22 (×2): 100 mg
  Filled 2021-06-21 (×2): qty 10

## 2021-06-21 MED ORDER — VASOPRESSIN 20 UNIT/ML IV SOLN
INTRAVENOUS | Status: DC | PRN
  Administered 2021-06-21: 5 [IU] via INTRAVENOUS
  Administered 2021-06-21 (×2): 2 [IU] via INTRAVENOUS
  Administered 2021-06-21: 5 [IU] via INTRAVENOUS
  Administered 2021-06-21: 2 [IU] via INTRAVENOUS

## 2021-06-21 NOTE — Progress Notes (Signed)
PROGRESS NOTE    Ashley [name]  CHE:[id number] DOB: [birth year] DOA: 06/19/2021 PCP: Merri Brunette, MD    Brief Narrative:  75 year old female with history of hypertension, hyperlipidemia, type 2 diabetes, atrial tachycardia, cryptogenic stroke presented to ER with 2 weeks of feeling fatigue, leg swelling and shortness of breath.  In the emergency room, she was found with heart rate of 172 and on A. fib.  She does see cardiology as outpatient with history of atrial tachycardia and intolerance of beta-blocker that caused bradycardia.  Started on Cardizem drip, changed to amiodarone with previous bradycardia, heparin infusion and admitted to the hospital.   Assessment & Plan:   Principal Problem:   New onset atrial fibrillation (HCC) Active Problems:   Essential hypertension   Controlled diabetes mellitus type II without complication (HCC)   Elevated troponin   AKI (acute kidney injury) (HCC)   Microcytosis   Atrial fibrillation with RVR (HCC)  New onset rapid A. fib: With history of atrial tachycardia and bradycardia due to beta-blockers. Persistent rapid A. fib despite being on amiodarone. Currently on amiodarone and Cardizem. Continue heparin infusion. Due to inadequate rate control despite maximizing therapies, patient is scheduled for TEE and cardioversion today.  Suspected diastolic heart failure/probably aggravated by rapid heart rate. Patient presents with new onset leg edema and shortness of breath.  Intermittent doses of Lasix given.  Echocardiogram with TEE.  Currently on IV Lasix.  Check BMP today.  Acute kidney injury: Suspect low perfusion due to rapid A. fib.  Intermittent diuretics.  Close monitoring renal functions.  Urine output is adequate.  Holding hydrochlorothiazide and Aldactone. Renal function test today and needs daily monitoring.  Type 2 diabetes without complications: Diet controlled.  Sliding scale in the hospital.  History of stroke: No new  neurological deficit.  On aspirin and statin.  Now on heparin.  Patient will mobilize and work with physical therapy after cardioversion.  Currently not stable enough to    DVT prophylaxis: Place and maintain sequential compression device Start: 06/19/21 1509.  Heparin drip   Code Status: Full code Family Communication: None Disposition Plan: Status is: Inpatient   Dispo: The patient is from: Home              Anticipated d/c is to: Home              Patient currently is not medically stable to d/c.   Difficult to place patient No         Consultants:  Cardiology  Procedures:  None  Antimicrobials:  None   Subjective: Seen and examined.  Denies any chest pain or shortness of breath.  Not mobilized yet.  She does not feel any chest pain or palpitation, however her heart rate remains persistently elevated overnight.  Ranges anywhere from 80-150.  Objective: Vitals:   06/20/21 2356 06/21/21 0459 06/21/21 0758 06/21/21 0800  BP: 117/65 109/87  120/79  Pulse: 79 77  67  Resp: 20 20 20  (!) 21  Temp: (!) 97.4 F (36.3 C) (!) 97.2 F (36.2 C) (!) 97 F (36.1 C) (!) 97.3 F (36.3 C)  TempSrc: Oral Oral Oral Oral  SpO2: 96% 97%  94%  Weight:      Height:        Intake/Output Summary (Last 24 hours) at 06/21/2021 1024 Last data filed at 06/21/2021 0759 Gross per 24 hour  Intake 577.25 ml  Output 925 ml  Net -347.75 ml   Filed Weights   06/19/21 1800  06/20/21 0546  Weight: 121 kg 121 kg    Examination:  General exam: Appears calm and comfortable.  Anxious today. Respiratory system: Clear to auscultation. Respiratory effort normal.  No added sounds. Cardiovascular system: S1 & S2 heard, irregularly irregular.  Tachycardic.  2+ bilateral pedal edema.   Gastrointestinal system: Abdomen is nondistended, soft and nontender. No organomegaly or masses felt. Normal bowel sounds heard. Central nervous system: Alert and oriented. No focal neurological  deficits. Extremities: Symmetric 5 x 5 power. Skin: No rashes, lesions or ulcers Psychiatry: Judgement and insight appear normal. Mood & affect appropriate.     Data Reviewed: I have personally reviewed following labs and imaging studies  CBC: Recent Labs  Lab 06/19/21 1145 06/20/21 0045 06/21/21 0037  WBC 6.7 7.6 7.2  NEUTROABS 4.6  --   --   HGB 13.6 12.3 12.9  HCT 42.2 38.5 39.5  MCV 75.5* 75.5* 74.4*  PLT 189 181 201   Basic Metabolic Panel: Recent Labs  Lab 06/19/21 1145 06/20/21 0045  NA 139 137  K 3.8 3.7  CL 100 101  CO2 25 25  GLUCOSE 149* 134*  BUN 55* 50*  CREATININE 1.45* 1.44*  CALCIUM 10.1 9.3  MG 2.2  --    GFR: Estimated Creatinine Clearance: 46.2 mL/min (A) (by C-G formula based on SCr of 1.44 mg/dL (H)). Liver Function Tests: Recent Labs  Lab 06/19/21 1145  AST 31  ALT 36  ALKPHOS 89  BILITOT 1.8*  PROT 7.1  ALBUMIN 3.7   No results for input(s): LIPASE, AMYLASE in the last 168 hours. Recent Labs  Lab 06/19/21 1255  AMMONIA 27   Coagulation Profile: No results for input(s): INR, PROTIME in the last 168 hours. Cardiac Enzymes: No results for input(s): CKTOTAL, CKMB, CKMBINDEX, TROPONINI in the last 168 hours. BNP (last 3 results) No results for input(s): PROBNP in the last 8760 hours. HbA1C: No results for input(s): HGBA1C in the last 72 hours. CBG: Recent Labs  Lab 06/20/21 0636 06/20/21 1124 06/20/21 1647 06/20/21 2115 06/21/21 0601  GLUCAP 129* 100* 120* 124* 139*   Lipid Profile: No results for input(s): CHOL, HDL, LDLCALC, TRIG, CHOLHDL, LDLDIRECT in the last 72 hours. Thyroid Function Tests: Recent Labs    06/19/21 1229  TSH 1.412   Anemia Panel: Recent Labs    06/20/21 0045  FERRITIN 164  TIBC 251  IRON 29   Sepsis Labs: No results for input(s): PROCALCITON, LATICACIDVEN in the last 168 hours.  Recent Results (from the past 240 hour(s))  Resp Panel by RT-PCR (Flu A&B, Covid) Nasopharyngeal Swab      Status: None   Collection Time: 06/19/21 12:29 PM   Specimen: Nasopharyngeal Swab; Nasopharyngeal(NP) swabs in vial transport medium  Result Value Ref Range Status   SARS Coronavirus 2 by RT PCR NEGATIVE NEGATIVE Final    Comment: (NOTE) SARS-CoV-2 target nucleic acids are NOT DETECTED.  The SARS-CoV-2 RNA is generally detectable in upper respiratory specimens during the acute phase of infection. The lowest concentration of SARS-CoV-2 viral copies this assay can detect is 138 copies/mL. A negative result does not preclude SARS-Cov-2 infection and should not be used as the sole basis for treatment or other patient management decisions. A negative result may occur with  improper specimen collection/handling, submission of specimen other than nasopharyngeal swab, presence of viral mutation(s) within the areas targeted by this assay, and inadequate number of viral copies(<138 copies/mL). A negative result must be combined with clinical observations, patient history, and epidemiological  information. The expected result is Negative.  Fact Sheet for Patients:  BloggerCourse.com  Fact Sheet for Healthcare Providers:  SeriousBroker.it  This test is no t yet approved or cleared by the [place] FDA and  has been authorized for detection and/or diagnosis of SARS-CoV-2 by FDA under an Emergency Use Authorization (EUA). This EUA will remain  in effect (meaning this test can be used) for the duration of the COVID-19 declaration under Section 564(b)(1) of the Act, 21 U.S.C.section 360bbb-3(b)(1), unless the authorization is terminated  or revoked sooner.       Influenza A by PCR NEGATIVE NEGATIVE Final   Influenza B by PCR NEGATIVE NEGATIVE Final    Comment: (NOTE) The Xpert Xpress SARS-CoV-2/FLU/RSV plus assay is intended as an aid in the diagnosis of influenza from Nasopharyngeal swab specimens and should not be used as a sole basis  for treatment. Nasal washings and aspirates are unacceptable for Xpert Xpress SARS-CoV-2/FLU/RSV testing.  Fact Sheet for Patients: BloggerCourse.com  Fact Sheet for Healthcare Providers: SeriousBroker.it  This test is not yet approved or cleared by the [place] FDA and has been authorized for detection and/or diagnosis of SARS-CoV-2 by FDA under an Emergency Use Authorization (EUA). This EUA will remain in effect (meaning this test can be used) for the duration of the COVID-19 declaration under Section 564(b)(1) of the Act, 21 U.S.C. section 360bbb-3(b)(1), unless the authorization is terminated or revoked.  Performed at [facility], [address] [place], [place] [address]   Urine Culture     Status: Abnormal   Collection Time: 06/20/21  1:37 AM   Specimen: Urine, Clean Catch  Result Value Ref Range Status   Specimen Description URINE, CLEAN CATCH  Final   Special Requests   Final    NONE Performed at [facility], [address] [place], [place] [address]    Culture MULTIPLE SPECIES PRESENT, SUGGEST RECOLLECTION (A)  Final   Report Status 06/21/2021 FINAL  Final  MRSA Next Gen by PCR, Nasal     Status: None   Collection Time: 06/21/21  2:54 AM   Specimen: Nasal Mucosa; Nasal Swab  Result Value Ref Range Status   MRSA by PCR Next Gen NOT DETECTED NOT DETECTED Final    Comment: (NOTE) The GeneXpert MRSA Assay (FDA approved for NASAL specimens only), is one component of a comprehensive MRSA colonization surveillance program. It is not intended to diagnose MRSA infection nor to guide or monitor treatment for MRSA infections. Test performance is not FDA approved in patients less than 56 years old. Performed at [facility], [address], [place], [place] [address]          Radiology Studies: DG Chest Portable 1 View  Result Date: 06/19/2021 CLINICAL DATA:  New A. fib with  RVR, new edema, worsening exertional shortness of breath rales on exam. EXAM: PORTABLE CHEST 1 VIEW COMPARISON:  None. FINDINGS: Cardiomegaly. No significant pleural effusion. No pneumothorax. No focal consolidation appreciated. No overt pulmonary edema. No acute osseous abnormality. IMPRESSION: Cardiomegaly without overt pulmonary edema. No significant effusion or consolidative process in the lungs. Electronically Signed   By: Olive Bass M.D.   On: 06/19/2021 13:05        Scheduled Meds:  aspirin EC  81 mg Oral Daily   cholecalciferol  5,000 Units Oral Daily   diltiazem  120 mg Oral Daily   furosemide  40 mg Intravenous Q12H   insulin aspart  0-9 Units Subcutaneous TID  WC   rosuvastatin  5 mg Oral Daily   sodium chloride flush  3 mL Intravenous Q12H   Continuous Infusions:  sodium chloride     sodium chloride     amiodarone 30 mg/hr (06/21/21 0613)   heparin 800 Units/hr (06/21/21 0326)   promethazine (PHENERGAN) injection (IM or IVPB)       LOS: 1 day    Time spent: 30 minutes    Dorcas Carrow, MD [facility] Pager [phone]

## 2021-06-21 NOTE — Anesthesia Procedure Notes (Signed)
Arterial Line Insertion Start/End10/11/2020 2:30 PM, 06/21/2021 2:35 PM Performed by: Adria Dill, CRNA, CRNA  Patient location: OOR procedure area. Preanesthetic checklist: patient identified Emergency situation Left, radial was placed Catheter size: 20 G Hand hygiene performed , maximum sterile barriers used  and Seldinger technique used Allen's test indicative of satisfactory collateral circulation Attempts: 1 Procedure performed without using ultrasound guided technique. Following insertion, dressing applied and Biopatch. Post procedure assessment: normal and unchanged  Patient tolerated the procedure well with no immediate complications.

## 2021-06-21 NOTE — Progress Notes (Addendum)
eLink Physician-Brief Progress Note Patient Name: Ashley [name] DOB: [birth year] MRN: [id number]   Date of Service  06/21/2021  HPI/Events of Note  Notified of rapid atrial fibrillation on amiodarone gtt and levophed at 2 mcg. Pt received lasix this morning with K at 3.6.  eICU Interventions  Start neosynephrine and titrate levophed to off.  Check BMP and Mg now and replete as needed.  Continue amiodarone gtt.     Intervention Category Intermediate Interventions: Arrhythmia - evaluation and management  Larinda Buttery 06/21/2021, 7:56 PM  10:02 PM On follow up, HR in the 100s-117.  BP 102/69.  K 3.2, crea 1.32.   Plan> Quad strength neosynephrine.  Insert PICC line. Central line fell out earlier today. Continue amiodarone gtt.  Replete K - ordered.  11:01 PM Notified of bright red blood through the OGT as pt was giving KCL. Via tube. Pt with PE s/p TPA and is currently on heparin gtt.   Plan> Hold heparn gtt.  Increase protonix to 40mg  IV BID.  Give Kcl IV.  12:27 AM Notified of small amount of blood suctioned through the ETT.   PICC is in place.  CXR reviewed.   Plan> Ok to use PICC. Send AM labs sooner.

## 2021-06-21 NOTE — Progress Notes (Signed)
  Echocardiogram Echocardiogram Transesophageal has been performed.  Delcie Roch 06/21/2021, 3:31 PM

## 2021-06-21 NOTE — Progress Notes (Signed)
Dear Doctor: This patient has been identified as a candidate for PICC / CVC for the following reason (s): poor veins/poor circulatory system (CHF, COPD, emphysema, diabetes, steroid use, IV drug abuse, etc.), restarts due to phlebitis and infiltration in 24 hours, and incompatible drugs (aminophyllin, TPN, heparin, given with an antibiotic) If you agree, please write an order for the indicated device.   Thank you for supporting the early vascular access assessment program.

## 2021-06-21 NOTE — H&P (View-Only) (Signed)
Subjective:  Nausea this morning, resolved now Leg swelling better Breathing unchanged  Objective:  Vital Signs in the last 24 hours: Temp:  [97 F (36.1 C)-98 F (36.7 C)] 97 F (36.1 C) (10/03 0758) Pulse Rate:  [67-127] 67 (10/03 0800) Resp:  [20-26] 21 (10/03 0800) BP: (109-136)/(65-105) 120/79 (10/03 0800) SpO2:  [92 %-97 %] 94 % (10/03 0800)  Intake/Output from previous day: 10/02 0701 - 10/03 0700 In: 302.6 [P.O.:60; I.V.:242.6] Out: 925 [Urine:925]  Physical Exam Vitals and nursing note reviewed.  Constitutional:      General: She is not in acute distress.    Appearance: She is well-developed.  HENT:     Head: Normocephalic and atraumatic.  Eyes:     Conjunctiva/sclera: Conjunctivae normal.     Pupils: Pupils are equal, round, and reactive to light.  Neck:     Vascular: JVD present.  Cardiovascular:     Rate and Rhythm: Tachycardia present. Rhythm irregular.     Pulses: Normal pulses and intact distal pulses.     Heart sounds: No murmur heard. Pulmonary:     Effort: Pulmonary effort is normal.     Breath sounds: Examination of the right-lower field reveals rales. Rales present. No wheezing.  Abdominal:     General: Bowel sounds are normal.     Palpations: Abdomen is soft.     Tenderness: There is no rebound.  Musculoskeletal:        General: No tenderness. Normal range of motion.     Right lower leg: Edema (1+) present.     Left lower leg: Edema (1+) present.  Lymphadenopathy:     Cervical: No cervical adenopathy.  Skin:    General: Skin is warm and dry.  Neurological:     Mental Status: She is alert and oriented to person, place, and time.     Cranial Nerves: No cranial nerve deficit.   Rate slightly slower than 10/1, otherwise no change in physical exam  Lab Results: BMP Recent Labs    06/19/21 1145 06/20/21 0045  NA 139 137  K 3.8 3.7  CL 100 101  CO2 25 25  GLUCOSE 149* 134*  BUN 55* 50*  CREATININE 1.45* 1.44*  CALCIUM 10.1 9.3   GFRNONAA 38* 38*     CBC Recent Labs  Lab 06/19/21 1145 06/20/21 0045 06/21/21 0037  WBC 6.7   < > 7.2  RBC 5.59*   < > 5.31*  HGB 13.6   < > 12.9  HCT 42.2   < > 39.5  PLT 189   < > 201  MCV 75.5*   < > 74.4*  MCH 24.3*   < > 24.3*  MCHC 32.2   < > 32.7  RDW 18.7*   < > 18.1*  LYMPHSABS 1.5  --   --   MONOABS 0.6  --   --   EOSABS 0.0  --   --   BASOSABS 0.0  --   --    < > = values in this interval not displayed.     HEMOGLOBIN A1C Lab Results  Component Value Date   HGBA1C 6.2 (H) 04/04/2020   MPG 131.24 04/04/2020    Cardiac Panel (last 3 results) No results for input(s): CKTOTAL, CKMB, TROPONINI, RELINDX in the last 8760 hours.  BNP (last 3 results) Recent Labs    06/19/21 1145  BNP 958.1*     TSH Recent Labs    06/19/21 1229  TSH 1.412     Lipid  Panel     Component Value Date/Time   CHOL 163 04/05/2020 0334   TRIG 64 04/05/2020 0334   HDL 63 04/05/2020 0334   CHOLHDL 2.6 04/05/2020 0334   VLDL 13 04/05/2020 0334   LDLCALC 87 04/05/2020 0334     Hepatic Function Panel Recent Labs    06/19/21 1145  PROT 7.1  ALBUMIN 3.7  AST 31  ALT 36  ALKPHOS 89  BILITOT 1.8*     EKG 06/19/2021: A. fib with RVR, nonspecific T wave changes   Echocardiogram 04/05/2020:  1. Left ventricular ejection fraction, by estimation, is 55 to 60%. The  left ventricle has normal function. The left ventricle has no regional  wall motion abnormalities. There is mild left ventricular hypertrophy.  Left ventricular diastolic parameters  are indeterminate.   2. Right ventricular systolic function is normal. The right ventricular  size is normal.   3. Left atrial size was moderately dilated.   4. Nosignificant valvular abnormality.   5. Agitated saline contrast bubble study was negative, with no evidence  of any interatrial shunt.        Assessment & Recommendations:     75 y.o. African American female  with hypertension, type 2 DM, obesity, h/o  atrial tachycardia, h/o stroke (03/2020), now admitted with Afib RVR   A. fib with RVR: Remains in 140s on IV amiodarone and PO diltiazem 120 mg daily. Plan for TEE cardioversion at 1:30 AM today. Keep NPO. I will prefer complete TTE after she is converted or at least better rate controlled. Will get an idea of EF on TEE. CHA2DS2VASc score 6, annual stroke risk 9%. Continue IV heparin for now. Will eventually need PO anticoagulation. Also recommend IV Lasix 40 mg twice daily. Strict I/O. I anticipate her creatinine may increase further, with aggressive diuresis.  However, in the long run, I do expect the creatinine to improve with adequate diuresis. BMP ordered and pending.  Elevated troponin: Secondary to Afib with RVR and possible systolic heart failure Echocardiogram pending   Hypertension: Controlled     Elder Negus, MD Pager: [phone] Office: [phone]

## 2021-06-21 NOTE — Progress Notes (Signed)
Peripherally Inserted Central Catheter Placement  The IV Nurse has discussed with the patient and/or persons authorized to consent for the patient, the purpose of this procedure and the potential benefits and risks involved with this procedure.  The benefits include less needle sticks, lab draws from the catheter, and the patient may be discharged home with the catheter. Risks include, but not limited to, infection, bleeding, blood clot (thrombus formation), and puncture of an artery; nerve damage and irregular heartbeat and possibility to perform a PICC exchange if needed/ordered by physician.  Alternatives to this procedure were also discussed.  Bard Power PICC patient education guide, fact sheet on infection prevention and patient information card has been provided to patient /or left at bedside.Placed by Burnard Bunting RN.    PICC Placement Documentation  PICC Triple Lumen 06/21/21 PICC Right Basilic 43 cm 0 cm (Active)  Indication for Insertion or Continuance of Line Limited venous access - need for IV therapy >5 days (PICC only) 06/21/21 2339  Exposed Catheter (cm) 0 cm 06/21/21 2339  Site Assessment Clean;Dry;Intact 06/21/21 2339  Lumen #1 Status Blood return noted;Flushed;Saline locked 06/21/21 2339  Lumen #2 Status Blood return noted;Flushed;Saline locked 06/21/21 2339  Lumen #3 Status Blood return noted;Flushed;Saline locked 06/21/21 2339  Dressing Type Transparent;Securing device 06/21/21 2339  Dressing Status Clean;Dry;Intact 06/21/21 2339  Antimicrobial disc in place? Yes 06/21/21 2339  Safety Lock Not Applicable 06/21/21 2339  Line Adjustment (NICU/IV Team Only) No 06/21/21 2339  Dressing Intervention New dressing 06/21/21 2339  Dressing Change Due 06/28/21 06/21/21 2339       Christeen Douglas 06/21/2021, 11:45 PM

## 2021-06-21 NOTE — Interval H&P Note (Signed)
History and Physical Interval Note:  06/21/2021 9:11 AM  Ashley [name]  has presented today for surgery, with the diagnosis of afib with RVR.  The various methods of treatment have been discussed with the patient and family. After consideration of risks, benefits and other options for treatment, the patient has consented to  Procedure(s): TRANSESOPHAGEAL ECHOCARDIOGRAM (TEE) (N/A) CARDIOVERSION (N/A) as a surgical intervention.  The patient's history has been reviewed, patient examined, no change in status, stable for surgery.  I have reviewed the patient's chart and labs.  Questions were answered to the patient's satisfaction.     Demesha Boorman J Samika Vetsch

## 2021-06-21 NOTE — Procedures (Addendum)
Addendum: catheter fell out after turning somehow; Bps okay for now with minimal levophed, has 3 PIVs.  Will hold off placing another one for now, discussed with night team.  Central Venous Catheter Insertion Procedure Note  Ashley [name]  [id number]  1946/02/18  Date:06/21/21  Time:4:06 PM   Provider Performing:Gizella [place] Erby Pian   Procedure: Insertion of Non-tunneled Central Venous [phone]) with US guidance ([id number])   Indication(s) Medication administration and Difficult access  Consent Verbal from son, semi-emergent  Anesthesia Topical only with 1% lidocaine   Timeout Verified patient identification, verified procedure, site/side was marked, verified correct patient position, special equipment/implants available, medications/allergies/relevant history reviewed, required imaging and test results available.  Sterile Technique Maximal sterile technique including full sterile barrier drape, hand hygiene, sterile gown, sterile gloves, mask, hair covering, sterile ultrasound probe cover (if used).  Procedure Description Area of catheter insertion was cleaned with chlorhexidine and draped in sterile fashion.  With real-time ultrasound guidance a central venous catheter was placed into the left internal jugular vein. Nonpulsatile blood flow and easy flushing noted in all ports.  The catheter was sutured in place and sterile dressing applied.  Complications/Tolerance None; patient tolerated the procedure well. Chest X-ray is ordered to verify placement for internal jugular or subclavian cannulation.   Chest x-ray is not ordered for femoral cannulation.  EBL Minimal  Specimen(s) None

## 2021-06-21 NOTE — TOC Benefit Eligibility Note (Signed)
Patient Product/process development scientist completed.    The patient is currently admitted and upon discharge could be taking Eliquis 5 mg.  The current 30 day co-pay is, $0.00.   The patient is currently admitted and upon discharge could be taking Xarelto 20 mg.  The current 30 day co-pay is, $0.00.   The patient is insured through [employer]     Ashley [name], CPhT Pharmacy Patient Advocate Specialist [facility] Antimicrobial Stewardship Team Direct Number: [phone]  Fax: [phone]

## 2021-06-21 NOTE — Anesthesia Preprocedure Evaluation (Signed)
Anesthesia Evaluation  Patient identified by MRN, date of birth, ID band Patient awake    Reviewed: Allergy & Precautions, NPO status , Patient's Chart, lab work & pertinent test results  Airway Mallampati: III  TM Distance: >3 FB Neck ROM: Full    Dental  (+) Poor Dentition, Missing   Pulmonary neg pulmonary ROS,    Pulmonary exam normal breath sounds clear to auscultation       Cardiovascular hypertension, Pt. on medications + dysrhythmias Atrial Fibrillation  Rhythm:Irregular Rate:Tachycardia     Neuro/Psych CVA    GI/Hepatic negative GI ROS, Neg liver ROS,   Endo/Other  Morbid obesity  Renal/GU negative Renal ROS     Musculoskeletal negative musculoskeletal ROS (+)   Abdominal (+) + obese,   Peds  Hematology HLD   Anesthesia Other Findings A-fib with RVR  Reproductive/Obstetrics                             Anesthesia Physical Anesthesia Plan  ASA: 4  Anesthesia Plan: General   Post-op Pain Management:    Induction: Intravenous  PONV Risk Score and Plan: 3 and Propofol infusion and Treatment may vary due to age or medical condition  Airway Management Planned: Nasal Cannula  Additional Equipment:   Intra-op Plan:   Post-operative Plan:   Informed Consent: I have reviewed the patients History and Physical, chart, labs and discussed the procedure including the risks, benefits and alternatives for the proposed anesthesia with the patient or authorized representative who has indicated his/her understanding and acceptance.     Dental advisory given  Plan Discussed with: CRNA  Anesthesia Plan Comments:         Anesthesia Quick Evaluation

## 2021-06-21 NOTE — Progress Notes (Signed)
Pt is complaining of nausea and tearful when this RN entered the room about the TEE/cardioversion procedure that is scheduled for this a.m. Toniann Fail, MD made aware. Will continue to monitor.    Bari Edward, RN

## 2021-06-21 NOTE — Progress Notes (Addendum)
ANTICOAGULATION CONSULT NOTE - Follow Up Consult  Pharmacy Consult for heparin Indication: afib, PE  Patient Measurements: Height: 5\' 7"  (170.2 cm) Weight: 121 kg (266 lb 12.1 oz) IBW/kg (Calculated) : 61.6 Heparin Dosing Weight: 90 kg  Labs: Recent Labs    06/19/21 1145 06/19/21 1411 06/20/21 0045 06/21/21 0037 06/21/21 1151 06/21/21 1447 06/21/21 1600 06/21/21 1830 06/21/21 1832 06/21/21 2018  HGB 13.6  --  12.3 12.9  --    < > 11.7* 14.6 12.1  --   HCT 42.2  --  38.5 39.5  --    < > 36.0 43.0 37.0  --   PLT 189  --  181 201  --   --  201  --  192  --   APTT  --   --   --   --   --   --  55*  --  39*  --   LABPROT  --   --   --   --   --   --  18.9*  --  20.2*  --   INR  --   --   --   --   --   --  1.6*  --  1.7*  --   HEPARINUNFRC  --   --  0.68 >1.10* 0.42  --   --   --   --   --   CREATININE 1.45*  --  1.44*  --  1.44*  --   --   --   --  1.32*  TROPONINIHS 49* 46*  --   --   --   --  62*  --   --   --    < > = values in this interval not displayed.     Assessment: 75 years of age female with atrial fibrillation on IV Heparin therapy. Patient with pulmonary embolism on CTA with severe RA/RV enlargement to receive IV Alteplase. Heparin held and plan to restart post IV Alteplase once aPTT <80 seconds. Note heparin was therapeutic prior to being held at 800 units/hr (~8 units/kg/hr). Discussed with MD that patient did have a stroke in November 2021 and ok to proceed.   Alteplase given 10/3 1647 aPTT drawn 30 minutes after infusion is <80. Heparin resumed for PE.  INR 1.7. H/H is stable. Platelets within normal limits. At this time some oozing from line site and some blood on suctioning per RN.   Update (2330): Pt now with bleeding from OGT - bright red blood (~73ml). Dr 45m (CCM) wants to hold heparin for now.   Goal of Therapy:  Heparin level 0.3-0.5 units/ml (for 24 hrs post TPA so until  10/4 1700)   Plan:  Heparin on hold Will f/u 0100 CBC and re-eval  bleeding at this time  12/4, PharmD, BCPS Please see amion for complete clinical pharmacist phone list 06/21/2021,11:40 PM  Addendum (0240): Hgb 11.6 (down a bit, plt 197 (stable). RN notes that bleeding seems to be better, still small amount of bright red blood when pt is suctioned. D/w Dr. 08/21/2021 and she is ok to restart heparin with no bolus.  Plan: Restart heparin at 650 units/hr Will f/u 8 hr heparin level F/u bleeding closely  Valora Piccolo, PharmD, BCPS Please see amion for complete clinical pharmacist phone list 06/22/2021 2:41 AM

## 2021-06-21 NOTE — Consult Note (Addendum)
NAME:  Ashley [name], MRN:  [id number], DOB:  1945-10-07, LOS: 1 ADMISSION DATE:  06/19/2021, CONSULTATION DATE:  06/21/21 REFERRING MD:  Rosemary Holms, CHIEF COMPLAINT:  SOB, leg swelling   History of Present Illness:  75 year old woman with hx of HTN, HLD, DM2, atrial flutter and prior stroke presenting with worsening heart failure symptoms found to be in florid heart failure with Afib refractory to medical therapy planned for TEE cardioversion today.  Unfortunately BP dropped quite a bit during procedure so aborted. Combination of TTE and TEE views do confirm no ventriclar or atrial clots.  Severe right ventricular systolic dysfunction and dilation noted.  Plan for CTA chest and admission to ICU.  Pertinent  Medical History  DM2 Afib/flutter CKD 3a HTN HLD Prior stroke  Significant Hospital Events: Including procedures, antibiotic start and stop dates in addition to other pertinent events   10/3 TEE started with plans of cardioversion complicated by patient becoming severely hypotensive and hypoxic requiring brief period of CPR for possible PEA and intubation.  Cardioversion was not performed. 10/3 central venous catheter placement followed by TPA   Interim History / Subjective:  Patient intubated. Remains in sinus tachycardia.  CTA showed substantial right-sided embolic burden, central venous catheter was placed followed by tPA administration with no complications.  Objective   Blood pressure (!) 145/107, pulse (!) 142, temperature (!) 97 F (36.1 C), temperature source Temporal, resp. rate (!) 25, height 5\' 7"  (1.702 m), weight 121 kg, SpO2 96 %.        Intake/Output Summary (Last 24 hours) at 06/21/2021 1448 Last data filed at 06/21/2021 0759 Gross per 24 hour  Intake 517.25 ml  Output 575 ml  Net -57.75 ml   Filed Weights   06/19/21 1800 06/20/21 0546  Weight: 121 kg 121 kg    Examination: General: ill appearing, NAD HENT: pupils reactive Lungs: intubated,  bilateral crackles, L>R Cardiovascular: tachycardia, regular rhythm, no murmurs  Abdomen: soft, non-distended, non tender Extremities: b/l LE 2+edema Neuro: responds to voice and pain Skin: no rashes, warm and dry  Resolved Hospital Problem list   None  Assessment & Plan:   Likely PEA secondary to 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  Found to be in atrial fibrillation with RVR with plans for cardioversion today during a TEE which unfortunately became hypotensive and possibly had PEA, brief episode of CPR and was intubated.  Of note she has a history of atrial tachycardia with poor response to beta-blockers in the past.  During this hospitalization she was being anticoagulated with heparin.  TEE showed RV with severe systolic dysfunction and dilation.  Cardioversion was not performed due to severe RV dysfunction.  CTA demonstrated substantial right-sided pan lobar pulmonary emboli with evidence of right heart strain.  Central venous catheter placed and tPA was administered without any complications. 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  On mechanical ventilation, ABG pH 7.3 PCO2 47 PO2 155 bicarb 23.8.  Pending patient's clinical improvement can try daily SBTs tomorrow. -Pulmonary hygiene  -Wean PEEP/FiO2 for SpO2 92-98% -VAP bundle  AKI, prerenal Creatinine 1.44, baseline around 0.8-1.  Likely due to hypoperfusion in the  setting of cardiac arrest and afib w/ RVR. Will check urine studies.  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 -Continue aspirin 81 mg and rosuvastatin 5 mg daily  Essential hypertension Continue to hold hydrochlorothiazide and Aldactone.  Type 2 diabetes -Blood glucose goal of 140-180 -SSI   Best Practice (right click and "Reselect all SmartList Selections" daily)   Diet/type: NPO DVT prophylaxis: systemic heparin GI prophylaxis: PPI Lines: N/A Foley:  Yes, and it is still needed Code Status:  full code Last date of multidisciplinary goals of care discussion [10/3]  Labs   CBC: Recent Labs  Lab 06/19/21 1145 06/20/21 0045 06/21/21 0037  WBC 6.7 7.6 7.2  NEUTROABS 4.6  --   --   HGB 13.6 12.3 12.9  HCT 42.2 38.5 39.5  MCV 75.5* 75.5* 74.4*  PLT 189 181 201    Basic Metabolic Panel: Recent Labs  Lab 06/19/21 1145 06/20/21 0045 06/21/21 1151  NA 139 137 138  K 3.8 3.7 3.6  CL 100 101 99  CO2 25 25 26   GLUCOSE 149* 134* 121*  BUN 55* 50* 45*  CREATININE 1.45* 1.44* 1.44*  CALCIUM 10.1 9.3 9.3  MG 2.2  --   --    GFR: Estimated Creatinine Clearance: 46.2 mL/min (A) (by C-G formula based on SCr of 1.44 mg/dL (H)). Recent Labs  Lab 06/19/21 1145 06/20/21 0045 06/21/21 0037  WBC 6.7 7.6 7.2    Liver Function Tests: Recent Labs  Lab 06/19/21 1145  AST 31  ALT 36  ALKPHOS 89  BILITOT 1.8*  PROT 7.1  ALBUMIN 3.7   No results for input(s): LIPASE, AMYLASE in the last 168 hours. Recent Labs  Lab 06/19/21 1255  AMMONIA 27    ABG    Component Value Date/Time   TCO2 29 04/04/2020 1946     Coagulation Profile: No results for input(s): INR, PROTIME in the last 168 hours.  Cardiac Enzymes: No results for input(s): CKTOTAL, CKMB, CKMBINDEX, TROPONINI in the last 168 hours.  HbA1C: Hgb A1c MFr Bld  Date/Time Value Ref Range Status  04/04/2020 07:25 PM 6.2 (H) 4.8 - 5.6 % Final    Comment:    (NOTE) Pre diabetes:          5.7%-6.4%  Diabetes:              >6.4%  Glycemic control for   <7.0% adults with diabetes      CBG: Recent Labs  Lab 06/20/21 1124 06/20/21 1647 06/20/21 2115 06/21/21 0601 06/21/21 1136  GLUCAP 100* 120* 124* 139* 129*    Review of Systems:   Unable to obtain  Past Medical History:  She,  has a past medical history of Essential hypertension (11/23/2018), Obesity (11/23/2018), and Stroke (HCC).   Surgical History:  History reviewed. No pertinent surgical history.   Social History:   reports that she has never smoked. She has never used smokeless tobacco. She reports that she does not drink alcohol and does not use drugs.   Family History:  Her family history includes Hypertension in her mother; Stroke in her mother.   Allergies Allergies  Allergen Reactions   Bystolic [Nebivolol Hcl]     Bradycardia    Coreg [Carvedilol]     bradycardia   Minoxidil     Abdominal pain     Home Medications  Prior to Admission medications   Medication Sig Start Date End Date  Taking? Authorizing Provider  acetaminophen (TYLENOL) 500 MG tablet Take 500 mg by mouth daily as needed for mild pain or headache.   Yes [provider]  aspirin 81 MG EC tablet Take 81 mg by mouth daily. 04/06/20  Yes [provider]  cetirizine (ZYRTEC) 10 MG tablet Take 10 mg by mouth daily as needed for allergies.   Yes [provider]  Cholecalciferol (VITAMIN D3) 5000 units CAPS Take 1 capsule by mouth daily.   Yes [provider]  hydrochlorothiazide (MICROZIDE) 12.5 MG capsule Take 1 capsule (12.5 mg total) by mouth daily. 08/24/20 06/19/21 Yes Cantwell, Celeste C, PA-C  rosuvastatin (CRESTOR) 5 MG tablet TAKE 1 TABLET BY MOUTH EVERY DAY Patient taking differently: Take 5 mg by mouth daily. 11/20/20  Yes Cantwell, Celeste C, PA-C  spironolactone (ALDACTONE) 25 MG tablet TAKE 1 TABLET BY MOUTH EVERY DAY Patient taking differently: Take 25 mg by mouth daily. 12/21/20  Yes Cantwell, Celeste C, PA-C  cloNIDine (CATAPRES) 0.2 MG tablet TAKE 1 TABLET (0.2 MG TOTAL) BY  MOUTH 2 (TWO) TIMES DAILY. Patient not taking: Reported on 06/19/2021 08/31/20   Yates Decamp, MD  valsartan-hydrochlorothiazide (DIOVAN-HCT) 320-12.5 MG tablet TAKE 1 TABLET BY MOUTH EVERY DAY Patient not taking: Reported on 06/19/2021 09/21/20   Yates Decamp, MD     Rosine Solecki, DO PGY-3 IMTS

## 2021-06-21 NOTE — Anesthesia Procedure Notes (Signed)
Procedure Name: MAC Date/Time: 06/21/2021 1:52 PM Performed by: Reece Agar, CRNA Pre-anesthesia Checklist: Patient identified, Emergency Drugs available, Suction available and Patient being monitored Patient Re-evaluated:Patient Re-evaluated prior to induction Oxygen Delivery Method: Nasal cannula

## 2021-06-21 NOTE — Progress Notes (Signed)
BUE assessed with [place]. No appropriate vein found for PIV placement or Midline. Veins either to deep or small. Requested nurse to follow up with MD. VU. Tomasita Morrow, RN VAST

## 2021-06-21 NOTE — Anesthesia Procedure Notes (Addendum)
Procedure Name: Intubation Date/Time: 06/21/2021 2:27 PM Performed by: Reece Agar, CRNA Pre-anesthesia Checklist: Emergency Drugs available, Suction available, Patient being monitored and Patient identified Patient Re-evaluated:Patient Re-evaluated prior to induction Oxygen Delivery Method: Circle System Utilized Preoxygenation: Pre-oxygenation with 100% oxygen Ventilation: Mask ventilation without difficulty and Oral airway inserted - appropriate to patient size Laryngoscope Size: Mac and 4 Grade View: Grade I Tube type: Oral Tube size: 7.5 mm Number of attempts: 1 Airway Equipment and Method: Stylet and Oral airway Placement Confirmation: ETT inserted through vocal cords under direct vision, positive ETCO2 and breath sounds checked- equal and bilateral Secured at: 23 cm Tube secured with: Tape Dental Injury: Teeth and Oropharynx as per pre-operative assessment

## 2021-06-21 NOTE — Progress Notes (Signed)
ANTICOAGULATION CONSULT NOTE - Follow Up Consult  Pharmacy Consult for heparin Indication: atrial fibrillation  Patient Measurements: Height: 5\' 7"  (170.2 cm) Weight: 121 kg (266 lb 12.1 oz) IBW/kg (Calculated) : 61.6 Heparin Dosing Weight: 90 kg  Labs: Recent Labs    06/19/21 1145 06/19/21 1411 06/20/21 0045 06/21/21 0037 06/21/21 1151 06/21/21 1447 06/21/21 1600 06/21/21 1830 06/21/21 1832  HGB 13.6  --  12.3 12.9  --    < > 11.7* 14.6 12.1  HCT 42.2  --  38.5 39.5  --    < > 36.0 43.0 37.0  PLT 189  --  181 201  --   --  201  --  192  APTT  --   --   --   --   --   --  55*  --  39*  LABPROT  --   --   --   --   --   --  18.9*  --  20.2*  INR  --   --   --   --   --   --  1.6*  --  1.7*  HEPARINUNFRC  --   --  0.68 >1.10* 0.42  --   --   --   --   CREATININE 1.45*  --  1.44*  --  1.44*  --   --   --   --   TROPONINIHS 49* 46*  --   --   --   --  62*  --   --    < > = values in this interval not displayed.    Assessment: 75 years of age female with atrial fibrillation on IV Heparin therapy. Patient with pulmonary embolism on CTA with severe RA/RV enlargement to receive IV Alteplase. Heparin held and plan to restart post IV Alteplase once aPTT <80 seconds. Note heparin was therapeutic prior to being held at 800 units/hr (~8 units/kg/hr). Discussed with MD that patient did have a stroke in November 2021 and ok to proceed.   aPTT drawn 30 minutes after infusion is <80. Will resume IV Heparin for PE.  INR 1.7. H/H is stable. Platelets within normal limits.  No overt bleeding noted - some oozing from line site and some blood on suctioning per RN. Will monitor closely   Goal of Therapy:  Heparin level 0.3-0.7 units/ml   Plan:  aPTT is now <80 seconds Resume IV Heparin at 650 units/hr (6.5 mL/hr).  Heparin level in 6 hours.  Daily Heparin level and CBC.   December 2021, PharmD, BCPS, BCCCP Clinical Pharmacist Please refer to [facility] for [facility] Pharmacy numbers   06/21/2021,7:39 PM

## 2021-06-21 NOTE — Progress Notes (Signed)
ANTICOAGULATION CONSULT NOTE - Follow Up Consult  Pharmacy Consult for heparin Indication: atrial fibrillation  Patient Measurements: Height: 5\' 7"  (170.2 cm) Weight: 121 kg (266 lb 12.1 oz) IBW/kg (Calculated) : 61.6 Heparin Dosing Weight: 90 kg  Labs: Recent Labs    06/19/21 1145 06/19/21 1411 06/20/21 0045 06/21/21 0037 06/21/21 1151 06/21/21 1447  HGB 13.6  --  12.3 12.9  --  14.6  HCT 42.2  --  38.5 39.5  --  43.0  PLT 189  --  181 201  --   --   HEPARINUNFRC  --   --  0.68 >1.10* 0.42  --   CREATININE 1.45*  --  1.44*  --  1.44*  --   TROPONINIHS 49* 46*  --   --   --   --     Assessment: 75 years of age female with atrial fibrillation on IV Heparin therapy. Patient with pulmonary embolism on CTA with severe RA/RV enlargement to receive IV Alteplase. Heparin held and plan to restart post IV Alteplase once aPTT <80 seconds. Note heparin was therapeutic prior to being held at 800 units/hr (~8 units/kg/hr). Discussed with MD that patient did have a stroke in November 2021 and ok to proceed.    Goal of Therapy:  Heparin level 0.3-0.7 units/ml   Plan:  Heparin on hold status post Alteplase. Follow-up aPTT and restart IV Heparin when aPTT <80 seconds.   December 2021, PharmD, BCPS, BCCCP Clinical Pharmacist Please refer to [facility] for [facility] Pharmacy numbers  06/21/2021,3:45 PM

## 2021-06-21 NOTE — Progress Notes (Addendum)
TEE was started with the intention of performing cardioversion. Limited TEE images show severely dilated RA, RV with severe RV systolic dysfunction. LV could not be adequately visualized. During the TEE< patient became severely hypotensive 39/25 mmHg and hypoxic in 60s requiring intubation be anesthesia. She briefly received CPR for possible PEA, but pulse was regained soon. I did not perform cardioversion given severe RV dysfunction. High suspicion for PE. Will transfer to 2 Heart. Currently on norepi drip. Appreciate anesthesia and CCM assistance.  Updated her son Iantha Fallen.  CRITICAL CARE Performed by: Truett Mainland   Total critical care time: 35 minutes   Critical care time was exclusive of separately billable procedures and treating other patients.   Critical care was necessary to treat or prevent imminent or life-threatening deterioration.   Critical care was time spent personally by me on the following activities: development of treatment plan with patient and/or surrogate as well as nursing, discussions with consultants, evaluation of patient's response to treatment, examination of patient, obtaining history from patient or surrogate, ordering and performing treatments and interventions, ordering and review of laboratory studies, ordering and review of radiographic studies, pulse oximetry and re-evaluation of patient's condition.      Elder Negus, MD Pager: [phone] Office: [phone]

## 2021-06-21 NOTE — Progress Notes (Signed)
ANTICOAGULATION CONSULT NOTE - Follow Up Consult  Pharmacy Consult for heparin Indication: atrial fibrillation  Labs: Recent Labs    06/19/21 1145 06/19/21 1411 06/20/21 0045 06/21/21 0037  HGB 13.6  --  12.3 12.9  HCT 42.2  --  38.5 39.5  PLT 189  --  181 201  HEPARINUNFRC  --   --  0.68 >1.10*  CREATININE 1.45*  --  1.44*  --   TROPONINIHS 49* 46*  --   --     Assessment: 74yo female supratherapeutic on heparin after one level at upper end of goal; no infusion issues or signs of bleeding per RN.  Goal of Therapy:  Heparin level 0.3-0.7 units/ml   Plan:  Will hold heparin infusion x1h then decrease heparin infusion by 4 units/kg/hr to 800 units/hr and check level in 8 hours.    Vernard Gambles, PharmD, BCPS  06/21/2021,1:48 AM

## 2021-06-21 NOTE — Progress Notes (Signed)
Patient's son consented to central line and TPA after hearing risks and benefits.  All questions answered.  Myrla Halsted MD PCCM

## 2021-06-21 NOTE — Anesthesia Postprocedure Evaluation (Signed)
Anesthesia Post Note  Patient: Biochemist, clinical  Procedure(s) Performed: TRANSESOPHAGEAL ECHOCARDIOGRAM (TEE) CARDIOVERSION     Patient location during evaluation: ICU Anesthesia Type: General Level of consciousness: sedated Pain management: pain level controlled Vital Signs Assessment: post-procedure vital signs reviewed and stable Respiratory status: patient remains intubated per anesthesia plan Cardiovascular status: stable Postop Assessment: no apparent nausea or vomiting Anesthetic complications: no   No notable events documented.  Last Vitals:  Vitals:   06/21/21 1830 06/21/21 1845  BP:    Pulse:    Resp: (!) 24 (!) 21  Temp: 36.9 C 36.7 C  SpO2: 100%     Last Pain:  Vitals:   06/21/21 1307  TempSrc: Temporal  PainSc: 0-No pain                 Abuk Selleck P Chani Ghanem

## 2021-06-21 NOTE — Transfer of Care (Signed)
Immediate Anesthesia Transfer of Care Note  Patient: Ashley [name]  Procedure(s) Performed: TRANSESOPHAGEAL ECHOCARDIOGRAM (TEE) CARDIOVERSION  Patient Location: ICU  Anesthesia Type:General  Level of Consciousness: Patient remains intubated per anesthesia plan  Airway & Oxygen Therapy: Code called during TEE. Pt required intubation and vasoactive gtt. Pt plan to remain intubated and go to CT then ICU to follow. Concern for new RV Failure/ PE.   Post-op Assessment: Report given to RN and Post -op Vital signs reviewed and stable  Post vital signs: Reviewed and stable  Last Vitals:  Vitals Value Taken Time  BP 115/74 06/21/21 1538  Temp    Pulse 122 06/21/21 1538  Resp 18 06/21/21 1538  SpO2 98 % 06/21/21 1538  Vitals shown include unvalidated device data.  Last Pain:  Vitals:   06/21/21 1307  TempSrc: Temporal  PainSc: 0-No pain      Patients Stated Pain Goal: 0 (06/20/21 2147)  Complications: No notable events documented.

## 2021-06-21 NOTE — Progress Notes (Addendum)
Subjective:  Nausea this morning, resolved now Leg swelling better Breathing unchanged  Objective:  Vital Signs in the last 24 hours: Temp:  [97 F (36.1 C)-98 F (36.7 C)] 97 F (36.1 C) (10/03 0758) Pulse Rate:  [67-127] 67 (10/03 0800) Resp:  [20-26] 21 (10/03 0800) BP: (109-136)/(65-105) 120/79 (10/03 0800) SpO2:  [92 %-97 %] 94 % (10/03 0800)  Intake/Output from previous day: 10/02 0701 - 10/03 0700 In: 302.6 [P.O.:60; I.V.:242.6] Out: 925 [Urine:925]  Physical Exam Vitals and nursing note reviewed.  Constitutional:      General: She is not in acute distress.    Appearance: She is well-developed.  HENT:     Head: Normocephalic and atraumatic.  Eyes:     Conjunctiva/sclera: Conjunctivae normal.     Pupils: Pupils are equal, round, and reactive to light.  Neck:     Vascular: JVD present.  Cardiovascular:     Rate and Rhythm: Tachycardia present. Rhythm irregular.     Pulses: Normal pulses and intact distal pulses.     Heart sounds: No murmur heard. Pulmonary:     Effort: Pulmonary effort is normal.     Breath sounds: Examination of the right-lower field reveals rales. Rales present. No wheezing.  Abdominal:     General: Bowel sounds are normal.     Palpations: Abdomen is soft.     Tenderness: There is no rebound.  Musculoskeletal:        General: No tenderness. Normal range of motion.     Right lower leg: Edema (1+) present.     Left lower leg: Edema (1+) present.  Lymphadenopathy:     Cervical: No cervical adenopathy.  Skin:    General: Skin is warm and dry.  Neurological:     Mental Status: She is alert and oriented to person, place, and time.     Cranial Nerves: No cranial nerve deficit.   Rate slightly slower than 10/1, otherwise no change in physical exam  Lab Results: BMP Recent Labs    06/19/21 1145 06/20/21 0045  NA 139 137  K 3.8 3.7  CL 100 101  CO2 25 25  GLUCOSE 149* 134*  BUN 55* 50*  CREATININE 1.45* 1.44*  CALCIUM 10.1 9.3   GFRNONAA 38* 38*     CBC Recent Labs  Lab 06/19/21 1145 06/20/21 0045 06/21/21 0037  WBC 6.7   < > 7.2  RBC 5.59*   < > 5.31*  HGB 13.6   < > 12.9  HCT 42.2   < > 39.5  PLT 189   < > 201  MCV 75.5*   < > 74.4*  MCH 24.3*   < > 24.3*  MCHC 32.2   < > 32.7  RDW 18.7*   < > 18.1*  LYMPHSABS 1.5  --   --   MONOABS 0.6  --   --   EOSABS 0.0  --   --   BASOSABS 0.0  --   --    < > = values in this interval not displayed.     HEMOGLOBIN A1C Lab Results  Component Value Date   HGBA1C 6.2 (H) 04/04/2020   MPG 131.24 04/04/2020    Cardiac Panel (last 3 results) No results for input(s): CKTOTAL, CKMB, TROPONINI, RELINDX in the last 8760 hours.  BNP (last 3 results) Recent Labs    06/19/21 1145  BNP 958.1*     TSH Recent Labs    06/19/21 1229  TSH 1.412     Lipid  Panel     Component Value Date/Time   CHOL 163 04/05/2020 0334   TRIG 64 04/05/2020 0334   HDL 63 04/05/2020 0334   CHOLHDL 2.6 04/05/2020 0334   VLDL 13 04/05/2020 0334   LDLCALC 87 04/05/2020 0334     Hepatic Function Panel Recent Labs    06/19/21 1145  PROT 7.1  ALBUMIN 3.7  AST 31  ALT 36  ALKPHOS 89  BILITOT 1.8*     EKG 06/19/2021: A. fib with RVR, nonspecific T wave changes   Echocardiogram 04/05/2020:  1. Left ventricular ejection fraction, by estimation, is 55 to 60%. The  left ventricle has normal function. The left ventricle has no regional  wall motion abnormalities. There is mild left ventricular hypertrophy.  Left ventricular diastolic parameters  are indeterminate.   2. Right ventricular systolic function is normal. The right ventricular  size is normal.   3. Left atrial size was moderately dilated.   4. Nosignificant valvular abnormality.   5. Agitated saline contrast bubble study was negative, with no evidence  of any interatrial shunt.        Assessment & Recommendations:     75 y.o. African American female  with hypertension, type 2 DM, obesity, h/o  atrial tachycardia, h/o stroke (03/2020), now admitted with Afib RVR   A. fib with RVR: Remains in 140s on IV amiodarone and PO diltiazem 120 mg daily. Plan for TEE cardioversion at 1:30 AM today. Keep NPO. I will prefer complete TTE after she is converted or at least better rate controlled. Will get an idea of EF on TEE. CHA2DS2VASc score 6, annual stroke risk 9%. Continue IV heparin for now. Will eventually need PO anticoagulation. Also recommend IV Lasix 40 mg twice daily. Strict I/O. I anticipate her creatinine may increase further, with aggressive diuresis.  However, in the long run, I do expect the creatinine to improve with adequate diuresis. BMP ordered and pending.  Elevated troponin: Secondary to Afib with RVR and possible systolic heart failure Echocardiogram pending   Hypertension: Controlled     Elder Negus, MD Pager: [phone] Office: [phone]

## 2021-06-21 NOTE — CV Procedure (Signed)
Limited TEE. Severe RA/RV enlargement Procedure aborted due to hypoxia, hypotension, requiring emergent intubation Full report to follow   Elder Negus, MD Pager: [phone] Office: [phone]

## 2021-06-22 ENCOUNTER — Inpatient Hospital Stay (HOSPITAL_COMMUNITY): Payer: Medicare Other

## 2021-06-22 DIAGNOSIS — I2699 Other pulmonary embolism without acute cor pulmonale: Secondary | ICD-10-CM

## 2021-06-22 DIAGNOSIS — I4891 Unspecified atrial fibrillation: Secondary | ICD-10-CM | POA: Diagnosis not present

## 2021-06-22 LAB — CBC
HCT: 36.5 % (ref 36.0–46.0)
Hemoglobin: 11.6 g/dL — ABNORMAL LOW (ref 12.0–15.0)
MCH: 24 pg — ABNORMAL LOW (ref 26.0–34.0)
MCHC: 31.8 g/dL (ref 30.0–36.0)
MCV: 75.6 fL — ABNORMAL LOW (ref 80.0–100.0)
Platelets: 197 10*3/uL (ref 150–400)
RBC: 4.83 MIL/uL (ref 3.87–5.11)
RDW: 17.7 % — ABNORMAL HIGH (ref 11.5–15.5)
WBC: 8.8 10*3/uL (ref 4.0–10.5)
nRBC: 0.6 % — ABNORMAL HIGH (ref 0.0–0.2)

## 2021-06-22 LAB — ECHOCARDIOGRAM COMPLETE
Calc EF: 40.1 %
Height: 67 in
S' Lateral: 4.1 cm
Single Plane A2C EF: 38.1 %
Single Plane A4C EF: 48.2 %
Weight: 4268.11 oz

## 2021-06-22 LAB — BASIC METABOLIC PANEL
Anion gap: 9 (ref 5–15)
BUN: 43 mg/dL — ABNORMAL HIGH (ref 8–23)
CO2: 25 mmol/L (ref 22–32)
Calcium: 8.6 mg/dL — ABNORMAL LOW (ref 8.9–10.3)
Chloride: 101 mmol/L (ref 98–111)
Creatinine, Ser: 1.26 mg/dL — ABNORMAL HIGH (ref 0.44–1.00)
GFR, Estimated: 45 mL/min — ABNORMAL LOW (ref >60)
Glucose, Bld: 163 mg/dL — ABNORMAL HIGH (ref 70–99)
Potassium: 4.5 mmol/L (ref 3.5–5.1)
Sodium: 135 mmol/L (ref 135–145)

## 2021-06-22 LAB — GLUCOSE, CAPILLARY
Glucose-Capillary: 164 mg/dL — ABNORMAL HIGH (ref 70–99)
Glucose-Capillary: 162 mg/dL — ABNORMAL HIGH (ref 70–99)
Glucose-Capillary: 117 mg/dL — ABNORMAL HIGH (ref 70–99)
Glucose-Capillary: 109 mg/dL — ABNORMAL HIGH (ref 70–99)
Glucose-Capillary: 105 mg/dL — ABNORMAL HIGH (ref 70–99)
Glucose-Capillary: 170 mg/dL — ABNORMAL HIGH (ref 70–99)

## 2021-06-22 LAB — HEPARIN LEVEL (UNFRACTIONATED)
Heparin Unfractionated: <0.1 IU/mL — ABNORMAL LOW (ref 0.30–0.70)
Heparin Unfractionated: 0.17 IU/mL — ABNORMAL LOW (ref 0.30–0.70)

## 2021-06-22 LAB — TRIGLYCERIDES: Triglycerides: 95 mg/dL (ref <150)

## 2021-06-22 MED ORDER — DILTIAZEM HCL ER COATED BEADS 120 MG PO CP24
120.0000 mg | ORAL_CAPSULE | Freq: Every day | ORAL | Status: DC
  Administered 2021-06-22: 120 mg via ORAL
  Filled 2021-06-22: qty 1

## 2021-06-22 MED ORDER — ORAL CARE MOUTH RINSE
15.0000 mL | OROMUCOSAL | Status: DC
  Administered 2021-06-22: 15 mL via OROMUCOSAL

## 2021-06-22 MED ORDER — FUROSEMIDE 10 MG/ML IJ SOLN
40.0000 mg | Freq: Two times a day (BID) | INTRAMUSCULAR | Status: DC
  Administered 2021-06-22 – 2021-06-23 (×3): 40 mg via INTRAVENOUS
  Filled 2021-06-22 (×3): qty 4

## 2021-06-22 MED ORDER — PERFLUTREN LIPID MICROSPHERE
1.0000 mL | INTRAVENOUS | Status: AC | PRN
  Administered 2021-06-22: 2 mL via INTRAVENOUS
  Filled 2021-06-22: qty 10

## 2021-06-22 MED ORDER — CHLORHEXIDINE GLUCONATE 0.12% ORAL RINSE (MEDLINE KIT)
15.0000 mL | Freq: Two times a day (BID) | OROMUCOSAL | Status: DC
  Administered 2021-06-22: 15 mL via OROMUCOSAL

## 2021-06-22 MED ORDER — ORAL CARE MOUTH RINSE
15.0000 mL | Freq: Two times a day (BID) | OROMUCOSAL | Status: DC
  Administered 2021-06-22 – 2021-06-27 (×10): 15 mL via OROMUCOSAL

## 2021-06-22 NOTE — Progress Notes (Signed)
OT Cancellation Note  Patient Details Name: Ashley [name] MRN: [id number] DOB: September 10, 1946   Cancelled Treatment:    Reason Eval/Treat Not Completed: Active bedrest order (Post TPA, available for eval at 15:30) OT will continue to follow for appropriateness of therapy evaluation.  [name] 06/22/2021, 8:20 AM  [name] OTR/L Acute Rehabilitation Services Pager: [phone] Office: [phone]

## 2021-06-22 NOTE — Progress Notes (Signed)
PT Cancellation Note  Patient Details Name: Ashley [name] MRN: [id number] DOB: [birth year]   Cancelled Treatment:    Reason Eval/Treat Not Completed: Active bedrest order (post-TPA); will follow-up for PT Evaluation as schedule permits.  Ina Homes, PT, DPT Acute Rehabilitation Services  Pager [phone] Office [phone]  Malachy Chamber 06/22/2021, 9:00 AM

## 2021-06-22 NOTE — Progress Notes (Signed)
  Echocardiogram 2D Echocardiogram with contrast has been performed.  Ashley [name] F 06/22/2021, 1:45 PM

## 2021-06-22 NOTE — Progress Notes (Signed)
Subjective:  Intubated, sedated.  TEE aborted due to hypotension, hypoxia Cardioversion not performed CTA performed given RV dilatation on TEE, found to have right sided massive PE S/p tPA for massive PE 10/3 Currently on neosynephrine 55 mcg/min  Objective:  Vital Signs in the last 24 hours: Temp:  [97 F (36.1 C)-98.6 F (37 C)] 98.4 F (36.9 C) (10/04 0751) Pulse Rate:  [65-143] 102 (10/04 0751) Resp:  [19-30] 21 (10/04 0751) BP: (67-145)/(54-114) 107/82 (10/04 0751) SpO2:  [48 %-100 %] 100 % (10/04 0751) Arterial Line BP: (80-158)/(49-92) 107/76 (10/04 0700) FiO2 (%):  [40 %-100 %] 40 % (10/04 0751)  Intake/Output from previous day: 10/03 0701 - 10/04 0700 In: 1857.1 [I.V.:1657.1; IV Piggyback:200] Out: 475 [Urine:475]  Physical Exam Vitals and nursing note reviewed.  Constitutional:      General: She is not in acute distress.    Appearance: She is well-developed.  HENT:     Head: Normocephalic and atraumatic.  Eyes:     Conjunctiva/sclera: Conjunctivae normal.     Pupils: Pupils are equal, round, and reactive to light.  Neck:     Vascular: JVD present.  Cardiovascular:     Rate and Rhythm: Normal rate. Rhythm irregular.     Pulses: Intact distal pulses.     Heart sounds: No murmur heard.    Comments: Distal pulses difficult to palpate due to edema Pulmonary:     Effort: Pulmonary effort is normal.     Breath sounds: Examination of the right-lower field reveals decreased breath sounds. Examination of the left-lower field reveals decreased breath sounds. Decreased breath sounds present. No wheezing.  Abdominal:     General: Bowel sounds are normal.     Palpations: Abdomen is soft.     Tenderness: There is no rebound.  Musculoskeletal:        General: No tenderness. Normal range of motion.     Right lower leg: Edema (1+) present.     Left lower leg: Edema (1+) present.  Lymphadenopathy:     Cervical: No cervical adenopathy.  Skin:    General: Skin is warm  and dry.  Neurological:     Mental Status: She is alert.     Cranial Nerves: No cranial nerve deficit.     Comments: Intubated, sedated Opens eyes to verbal stimuli    Lab Results: BMP Recent Labs    06/21/21 1151 06/21/21 1447 06/21/21 1830 06/21/21 2018 06/22/21 0302  NA 138   < > 139 135 135  K 3.6   < > 3.4* 3.2* 4.5  CL 99  --   --  101 101  CO2 26  --   --  22 25  GLUCOSE 121*  --   --  150* 163*  BUN 45*  --   --  44* 43*  CREATININE 1.44*  --   --  1.32* 1.26*  CALCIUM 9.3  --   --  8.7* 8.6*  GFRNONAA 38*  --   --  42* 45*   < > = values in this interval not displayed.     CBC Recent Labs  Lab 06/19/21 1145 06/20/21 0045 06/22/21 0016  WBC 6.7   < > 8.8  RBC 5.59*   < > 4.83  HGB 13.6   < > 11.6*  HCT 42.2   < > 36.5  PLT 189   < > 197  MCV 75.5*   < > 75.6*  MCH 24.3*   < > 24.0*  MCHC 32.2   < >  31.8  RDW 18.7*   < > 17.7*  LYMPHSABS 1.5  --   --   MONOABS 0.6  --   --   EOSABS 0.0  --   --   BASOSABS 0.0  --   --    < > = values in this interval not displayed.     HEMOGLOBIN A1C Lab Results  Component Value Date   HGBA1C 6.2 (H) 04/04/2020   MPG 131.24 04/04/2020    Cardiac Panel (last 3 results) No results for input(s): CKTOTAL, CKMB, TROPONINI, RELINDX in the last 8760 hours.  BNP (last 3 results) Recent Labs    06/19/21 1145  BNP 958.1*     TSH Recent Labs    06/19/21 1229  TSH 1.412     Lipid Panel     Component Value Date/Time   CHOL 163 04/05/2020 0334   TRIG 95 06/22/2021 0302   HDL 63 04/05/2020 0334   CHOLHDL 2.6 04/05/2020 0334   VLDL 13 04/05/2020 0334   LDLCALC 87 04/05/2020 0334     Hepatic Function Panel Recent Labs    06/19/21 1145  PROT 7.1  ALBUMIN 3.7  AST 31  ALT 36  ALKPHOS 89  BILITOT 1.8*     EKG 06/19/2021: A. fib with RVR, nonspecific T wave changes   Echocardiogram 04/05/2020:  1. Left ventricular ejection fraction, by estimation, is 55 to 60%. The  left ventricle has normal  function. The left ventricle has no regional  wall motion abnormalities. There is mild left ventricular hypertrophy.  Left ventricular diastolic parameters  are indeterminate.   2. Right ventricular systolic function is normal. The right ventricular  size is normal.   3. Left atrial size was moderately dilated.   4. Nosignificant valvular abnormality.   5. Agitated saline contrast bubble study was negative, with no evidence  of any interatrial shunt.        Assessment & Recommendations:     75 y.o. African American female  with hypertension, type 2 DM, obesity, h/o atrial tachycardia, h/o stroke (03/2020), now admitted with Afib RVR, subsequently found to have massive right sided PE  PE:  S/p tPA Weaning off neosynephrine Lactic acid 2/1-->2.0 MAPS around 90 Wean off neosynephrine, as tolerated Persistent right heart failure symptoms with b/l leg edema. Recommend IV lasxi 40 mg bid (ordered) Consider DVT workup  Will get complete TTE today  A. Fib: Persistent this admission Rate around 100 this morning Continue IV amiodarone. No immediate plans for cardioversion at this time. Discontinued PO diltiazem CHA2DS2VASc score 6, annual stroke risk 9%. Continue IV heparin for now. Will eventually need PO anticoagulation.  Elevated troponin: Secondary to Afib with RVR and PE   CRITICAL CARE Performed by: Truett Mainland   Total critical care time: 40 minutes   Critical care time was exclusive of separately billable procedures and treating other patients.   Critical care was necessary to treat or prevent imminent or life-threatening deterioration.   Critical care was time spent personally by me on the following activities: development of treatment plan with patient and/or surrogate as well as nursing, discussions with consultants, evaluation of patient's response to treatment, examination of patient, obtaining history from patient or surrogate, ordering and performing treatments  and interventions, ordering and review of laboratory studies, ordering and review of radiographic studies, pulse oximetry and re-evaluation of patient's condition.       Elder Negus, MD Pager: [phone] Office: [phone]

## 2021-06-22 NOTE — Progress Notes (Signed)
ANTICOAGULATION CONSULT NOTE - Follow Up Consult  Pharmacy Consult for heparin Indication: afib, PE  Patient Measurements: Height: 5\' 7"  (170.2 cm) Weight: 121 kg (266 lb 12.1 oz) IBW/kg (Calculated) : 61.6 Heparin Dosing Weight: 90 kg  Labs: Recent Labs    06/21/21 0037 06/21/21 1151 06/21/21 1447 06/21/21 1600 06/21/21 1830 06/21/21 1832 06/21/21 2018 06/22/21 0016 06/22/21 0302 06/22/21 1439  HGB 12.9  --    < > 11.7* 14.6 12.1  --  11.6*  --   --   HCT 39.5  --    < > 36.0 43.0 37.0  --  36.5  --   --   PLT 201  --   --  201  --  192  --  197  --   --   APTT  --   --   --  55*  --  39*  --   --   --   --   LABPROT  --   --   --  18.9*  --  20.2*  --   --   --   --   INR  --   --   --  1.6*  --  1.7*  --   --   --   --   HEPARINUNFRC >1.10* 0.42  --   --   --   --   --   --   --  <0.10*  CREATININE  --  1.44*  --   --   --   --  1.32*  --  1.26*  --   TROPONINIHS  --   --   --  62*  --   --   --   --   --   --    < > = values in this interval not displayed.     Assessment: 75 years of age female with atrial fibrillation on IV Heparin therapy. Patient with pulmonary embolism on CTA with severe RA/RV enlargement to receive IV Alteplase.   Alteplase given 10/3 1647  Heparin held and restarted post IV Alteplase once aPTT <80 seconds. Note heparin was therapeutic prior to being held at 800 units/hr (~8 units/kg/hr). Discussed with MD that patient did have a stroke in November 2021 and ok to proceed.   After heparin resumed patient experienced oozing from IV site and resumed at 650 units/hour. Then developed BRB coming from OGT ~26ml. Heparin held, restarted after bleeding improved - heparin restarted with no bolus at 650 units/hr.   Heparin level now undetectable at <0.1. Heparin running at 650 units/hr via PICC and heparin level drawn from peripheral on opposite arm. Will increase to 800 units/hr based upon current therapeutic dosing- no bolus given recent tPA and  bleeding.   No more bleeding issues, discussed with RN. H/h stable.   Goal of Therapy:  Heparin level 0.3-0.5 units/ml (for 24 hrs post TPA so until  10/4 1700)   Plan:  Increase heparin to 800 units/hr Check HL @ 2200 Daily HL and CBC  12/4, PharmD PGY2 Cardiology Pharmacy Resident Phone: [phone] 06/22/2021  3:51 PM  Please check [email or web address] for unit-specific pharmacy phone numbers.

## 2021-06-22 NOTE — Progress Notes (Signed)
   NAME:  Ashley [name], MRN:  [id number], DOB:  [birth year], LOS: 2 ADMISSION DATE:  06/19/2021, CONSULTATION DATE:  06/21/21 REFERRING MD:  Rosemary Holms, CHIEF COMPLAINT:  SOB, leg swelling   History of Present Illness:  75 year old woman with hx of HTN, HLD, DM2, atrial flutter and prior stroke presenting with worsening heart failure symptoms found to be in florid heart failure with Afib refractory to medical therapy planned for TEE cardioversion today.  Unfortunately BP dropped quite a bit during procedure so aborted. Combination of TTE and TEE views do confirm no ventriclar or atrial clots.  Severe right ventricular systolic dysfunction and dilation noted.  Plan for CTA chest and admission to ICU.  Pertinent  Medical History  DM2 Afib/flutter CKD 3a HTN HLD Prior stroke  Significant Hospital Events: Including procedures, antibiotic start and stop dates in addition to other pertinent events   10/3 TEE started with plans of cardioversion complicated by patient becoming severely hypotensive and hypoxic requiring brief period of CPR for possible PEA and intubation.  Cardioversion was not performed. 10/3 systemic TPA, PICC  Interim History / Subjective:  No events.  Levophed changed to phenylephrine with improvement in HR.  Sedated and on small amount of pressors.  Some oral bleeding overnight for which heparin was briefly paused now resumed.  Objective   Blood pressure 98/83, pulse (!) 102, temperature 98.4 F (36.9 C), resp. rate (!) 24, height 5\' 7"  (1.702 m), weight 121 kg, SpO2 91 %. CVP:  [9 mmHg-14 mmHg] 12 mmHg  Vent Mode: PRVC FiO2 (%):  [40 %-100 %] 40 % Set Rate:  [24 bmp] 24 bmp Vt Set:  [490 mL] 490 mL PEEP:  [5 cmH20] 5 cmH20 Plateau Pressure:  [19 cmH20-21 cmH20] 19 cmH20   Intake/Output Summary (Last 24 hours) at 06/22/2021 0736 Last data filed at 06/22/2021 0700 Gross per 24 hour  Intake 1857.08 ml  Output 475 ml  Net 1382.08 ml    Filed Weights   06/19/21  1800 06/20/21 0546  Weight: 121 kg 121 kg    Examination: No distress Pupils pinpoint but reactive RASS -5 Ext warm 3+ LE edema persists Lungs diminished at bases with crackles  CBC okay Cr holding steady   Resolved Hospital Problem list   None  Assessment & Plan:  Massive PE with some signs of chronic underlying pulmonary HTN s/p systemic TPA 10/3  Volume overload  Stroke, 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  Mild AKI  - Continue heparin drip - Titrate pressors, suspect ongoing need due to sedation - Continue amio drip - Wearn sedation, consider SAT/SBT - Would benefit from diuresis and cardioversion at some point if Bps allow, will d/w cardiology  Best Practice (right click and "Reselect all SmartList Selections" daily)   Diet/type: NPO, TF if does not extubate DVT prophylaxis: systemic heparin GI prophylaxis: PPI Lines: N/A Foley:  Yes, and it is still needed Code Status:  full code Last date of multidisciplinary goals of care discussion [10/3]   Patient critically ill due to shock, respiratory failure Interventions to address this today vent and pressor titration Risk of deterioration without these interventions is high  I personally spent 41 minutes providing critical care not including any separately billable procedures  Jan 2012 MD [facility]  Prefer epic messenger for cross cover needs If after hours, please call E-link

## 2021-06-22 NOTE — Progress Notes (Signed)
ANTICOAGULATION CONSULT NOTE - Follow Up Consult  Pharmacy Consult for heparin Indication: afib, PE  Patient Measurements: Height: 5\' 7"  (170.2 cm) Weight: 121 kg (266 lb 12.1 oz) IBW/kg (Calculated) : 61.6 Heparin Dosing Weight: 90 kg  Labs: Recent Labs    06/21/21 1151 06/21/21 1447 06/21/21 1600 06/21/21 1830 06/21/21 1832 06/21/21 2018 06/22/21 0016 06/22/21 0302 06/22/21 1439 06/22/21 2110  HGB  --    < > 11.7* 14.6 12.1  --  11.6*  --   --   --   HCT  --    < > 36.0 43.0 37.0  --  36.5  --   --   --   PLT  --   --  201  --  192  --  197  --   --   --   APTT  --   --  55*  --  39*  --   --   --   --   --   LABPROT  --   --  18.9*  --  20.2*  --   --   --   --   --   INR  --   --  1.6*  --  1.7*  --   --   --   --   --   HEPARINUNFRC 0.42  --   --   --   --   --   --   --  <0.10* 0.17*  CREATININE 1.44*  --   --   --   --  1.32*  --  1.26*  --   --   TROPONINIHS  --   --  62*  --   --   --   --   --   --   --    < > = values in this interval not displayed.     Assessment: 75 years of age female with atrial fibrillation on IV Heparin therapy. Patient with pulmonary embolism on CTA with severe RA/RV enlargement to receive IV Alteplase.   Alteplase given 10/3 1647  Heparin held and restarted post IV Alteplase once aPTT <80 seconds. Note heparin was therapeutic prior to being held at 800 units/hr (~8 units/kg/hr). Discussed with MD that patient did have a stroke in November 2021 and ok to proceed.   After heparin resumed patient experienced oozing from IV site and resumed at 650 units/hour. Then developed BRB coming from OGT ~3ml. Heparin held, restarted after bleeding improved - heparin restarted with no bolus at 650 units/hr.   Heparin level remains < goal at 0.17 on heparin drip 800 uts/hr.  no bolus given recent tPA and bleeding.     Goal of Therapy:  Heparin level 0.3-0.5 units/ml (for 24 hrs post TPA so until  10/4 1700)   Plan:  Increase heparin to 1000  units/hr Daily heparin level and CBC   12/4 Pharm.D. CPP, BCPS Clinical Pharmacist [phone] 06/22/2021 10:39 PM    Please check [email or web address] for unit-specific pharmacy phone numbers.

## 2021-06-22 NOTE — Procedures (Signed)
Extubation Procedure Note  Patient Details:   Name: Ashley [name] DOB: 05/29/1946 MRN: [id number]   Airway Documentation:    Vent end date: 06/22/21 Vent end time: 1106   Evaluation  O2 sats: stable throughout Complications: No apparent complications Patient did tolerate procedure well. Bilateral Breath Sounds: Clear, Diminished   Yes  Positive cuff leak noted. Patient placed on [place] 4L with humidity, no stridor noted. Patient has a productive cough with a moderate amount of bloody secretions. MD notified.  IS 500 mL.  Rayburn Felt 06/22/2021, 12:44 PM

## 2021-06-23 ENCOUNTER — Inpatient Hospital Stay (HOSPITAL_COMMUNITY): Payer: Medicare Other

## 2021-06-23 ENCOUNTER — Encounter (HOSPITAL_COMMUNITY): Payer: Self-pay | Admitting: Cardiology

## 2021-06-23 ENCOUNTER — Other Ambulatory Visit (HOSPITAL_COMMUNITY): Payer: Self-pay

## 2021-06-23 ENCOUNTER — Telehealth: Payer: Self-pay | Admitting: Pulmonary Disease

## 2021-06-23 DIAGNOSIS — I4891 Unspecified atrial fibrillation: Secondary | ICD-10-CM | POA: Diagnosis not present

## 2021-06-23 DIAGNOSIS — R778 Other specified abnormalities of plasma proteins: Secondary | ICD-10-CM

## 2021-06-23 DIAGNOSIS — I2699 Other pulmonary embolism without acute cor pulmonale: Secondary | ICD-10-CM

## 2021-06-23 LAB — HEPARIN LEVEL (UNFRACTIONATED)
Heparin Unfractionated: >1.1 IU/mL — ABNORMAL HIGH (ref 0.30–0.70)
Heparin Unfractionated: 0.37 IU/mL (ref 0.30–0.70)

## 2021-06-23 LAB — CBC
HCT: 36.3 % (ref 36.0–46.0)
Hemoglobin: 11.9 g/dL — ABNORMAL LOW (ref 12.0–15.0)
MCH: 24.5 pg — ABNORMAL LOW (ref 26.0–34.0)
MCHC: 32.8 g/dL (ref 30.0–36.0)
MCV: 74.7 fL — ABNORMAL LOW (ref 80.0–100.0)
Platelets: 194 10*3/uL (ref 150–400)
RBC: 4.86 MIL/uL (ref 3.87–5.11)
RDW: 17.7 % — ABNORMAL HIGH (ref 11.5–15.5)
WBC: 10.4 10*3/uL (ref 4.0–10.5)
nRBC: 0.6 % — ABNORMAL HIGH (ref 0.0–0.2)

## 2021-06-23 LAB — GLUCOSE, CAPILLARY
Glucose-Capillary: 104 mg/dL — ABNORMAL HIGH (ref 70–99)
Glucose-Capillary: 107 mg/dL — ABNORMAL HIGH (ref 70–99)
Glucose-Capillary: 109 mg/dL — ABNORMAL HIGH (ref 70–99)
Glucose-Capillary: 120 mg/dL — ABNORMAL HIGH (ref 70–99)
Glucose-Capillary: 128 mg/dL — ABNORMAL HIGH (ref 70–99)
Glucose-Capillary: 131 mg/dL — ABNORMAL HIGH (ref 70–99)
Glucose-Capillary: 137 mg/dL — ABNORMAL HIGH (ref 70–99)

## 2021-06-23 LAB — BASIC METABOLIC PANEL
Anion gap: 9 (ref 5–15)
BUN: 41 mg/dL — ABNORMAL HIGH (ref 8–23)
CO2: 28 mmol/L (ref 22–32)
Calcium: 8.7 mg/dL — ABNORMAL LOW (ref 8.9–10.3)
Chloride: 100 mmol/L (ref 98–111)
Creatinine, Ser: 1.21 mg/dL — ABNORMAL HIGH (ref 0.44–1.00)
GFR, Estimated: 47 mL/min — ABNORMAL LOW (ref >60)
Glucose, Bld: 130 mg/dL — ABNORMAL HIGH (ref 70–99)
Potassium: 3.3 mmol/L — ABNORMAL LOW (ref 3.5–5.1)
Sodium: 137 mmol/L (ref 135–145)

## 2021-06-23 MED ORDER — POTASSIUM CHLORIDE 10 MEQ/50ML IV SOLN
10.0000 meq | INTRAVENOUS | Status: AC
Start: 2021-06-23 — End: 2021-06-23
  Administered 2021-06-23 (×4): 10 meq via INTRAVENOUS
  Filled 2021-06-23 (×4): qty 50

## 2021-06-23 MED ORDER — AMIODARONE IV BOLUS ONLY 150 MG/100ML: 150.0000 mg | Freq: Once | INTRAVENOUS | Status: DC

## 2021-06-23 MED ORDER — DIGOXIN 125 MCG PO TABS
0.1250 mg | ORAL_TABLET | Freq: Every day | ORAL | Status: DC
  Administered 2021-06-24: 0.125 mg via ORAL
  Filled 2021-06-23: qty 1

## 2021-06-23 MED ORDER — POTASSIUM CHLORIDE CRYS ER 20 MEQ PO TBCR
20.0000 meq | EXTENDED_RELEASE_TABLET | ORAL | Status: AC
  Administered 2021-06-23 (×2): 20 meq via ORAL
  Filled 2021-06-23: qty 1

## 2021-06-23 MED ORDER — INSULIN ASPART 100 UNIT/ML IJ SOLN
0.0000 [IU] | Freq: Three times a day (TID) | INTRAMUSCULAR | Status: DC
  Administered 2021-06-24 (×3): 2 [IU] via SUBCUTANEOUS

## 2021-06-23 MED ORDER — DIGOXIN 125 MCG PO TABS
0.2500 mg | ORAL_TABLET | Freq: Once | ORAL | Status: AC
  Administered 2021-06-23: 0.25 mg via ORAL
  Filled 2021-06-23: qty 2

## 2021-06-23 MED ORDER — APIXABAN 5 MG PO TABS
10.0000 mg | ORAL_TABLET | Freq: Two times a day (BID) | ORAL | Status: DC
Start: 2021-06-23 — End: 2021-06-27
  Administered 2021-06-23 – 2021-06-27 (×9): 10 mg via ORAL
  Filled 2021-06-23 (×9): qty 2

## 2021-06-23 MED ORDER — APIXABAN 5 MG PO TABS: 5.0000 mg | ORAL_TABLET | Freq: Two times a day (BID) | ORAL | Status: DC

## 2021-06-23 MED ORDER — DILTIAZEM HCL 60 MG PO TABS
30.0000 mg | ORAL_TABLET | Freq: Three times a day (TID) | ORAL | Status: DC
  Administered 2021-06-23 – 2021-06-24 (×4): 30 mg via ORAL
  Filled 2021-06-23 (×4): qty 1

## 2021-06-23 MED ORDER — AMIODARONE LOAD VIA INFUSION
150.0000 mg | Freq: Once | INTRAVENOUS | Status: AC
  Administered 2021-06-23: 150 mg via INTRAVENOUS
  Filled 2021-06-23: qty 83.34

## 2021-06-23 MED ORDER — INFLUENZA VAC A&B SA ADJ QUAD 0.5 ML IM PRSY
0.5000 mL | PREFILLED_SYRINGE | INTRAMUSCULAR | Status: DC
  Filled 2021-06-23: qty 0.5

## 2021-06-23 MED ORDER — FUROSEMIDE 10 MG/ML IJ SOLN
40.0000 mg | Freq: Every day | INTRAMUSCULAR | Status: DC
  Administered 2021-06-24: 40 mg via INTRAVENOUS
  Filled 2021-06-23 (×3): qty 4

## 2021-06-23 MED ORDER — GUAIFENESIN-DM 100-10 MG/5ML PO SYRP
5.0000 mL | ORAL_SOLUTION | ORAL | Status: DC | PRN
  Administered 2021-06-23 – 2021-06-26 (×5): 5 mL via ORAL
  Filled 2021-06-23 (×6): qty 5

## 2021-06-23 MED ORDER — AMIODARONE HCL 200 MG PO TABS
200.0000 mg | ORAL_TABLET | Freq: Two times a day (BID) | ORAL | Status: DC
  Administered 2021-06-23 – 2021-06-27 (×9): 200 mg via ORAL
  Filled 2021-06-23 (×9): qty 1

## 2021-06-23 MED ORDER — PANTOPRAZOLE SODIUM 40 MG PO TBEC
40.0000 mg | DELAYED_RELEASE_TABLET | Freq: Two times a day (BID) | ORAL | Status: DC
  Administered 2021-06-23 – 2021-06-27 (×9): 40 mg via ORAL
  Filled 2021-06-23 (×9): qty 1

## 2021-06-23 MED ORDER — METOPROLOL TARTRATE 25 MG PO TABS
25.0000 mg | ORAL_TABLET | Freq: Two times a day (BID) | ORAL | Status: DC
  Administered 2021-06-23 – 2021-06-24 (×3): 25 mg via ORAL
  Filled 2021-06-23 (×3): qty 1

## 2021-06-23 MED ORDER — SPIRONOLACTONE 25 MG PO TABS
25.0000 mg | ORAL_TABLET | Freq: Every day | ORAL | Status: DC
  Administered 2021-06-23 – 2021-06-27 (×5): 25 mg via ORAL
  Filled 2021-06-23 (×5): qty 1

## 2021-06-23 MED ORDER — FUROSEMIDE 10 MG/ML IJ SOLN
20.0000 mg | Freq: Once | INTRAMUSCULAR | Status: AC
  Administered 2021-06-23: 20 mg via INTRAVENOUS
  Filled 2021-06-23: qty 2

## 2021-06-23 NOTE — Discharge Instructions (Addendum)
Follow with Primary MD Merri Brunette, MD in 7 days   Get CBC, CMP, 2 view Chest X ray -  checked next visit within 1 week by Primary MD    Activity: As tolerated with Full fall precautions use walker/cane & assistance as needed  Disposition Home   Diet: Soft - Heart Healthy , with feeding assistance and aspiration precautions.  Strict 1.5 L fluid restriction per day.   Special Instructions: If you have smoked or chewed Tobacco  in the last 2 yrs please stop smoking, stop any regular Alcohol  and or any Recreational drug use.  On your next visit with your primary care physician please Get Medicines reviewed and adjusted.  Please request your Prim.MD to go over all Hospital Tests and Procedure/Radiological results at the follow up, please get all Hospital records sent to your Prim MD by signing hospital release before you go home.  If you experience worsening of your admission symptoms, develop shortness of breath, life threatening emergency, suicidal or homicidal thoughts you must seek medical attention immediately by calling 911 or calling your MD immediately  if symptoms less severe.  You Must read complete instructions/literature along with all the possible adverse reactions/side effects for all the Medicines you take and that have been prescribed to you. Take any new Medicines after you have completely understood and accpet all the possible adverse reactions/side effects.    Information on my medicine - ELIQUIS (apixaban)  This medication education was reviewed with me or my healthcare representative as part of my discharge preparation.  Why was Eliquis prescribed for you? Eliquis was prescribed for you to treat the blood clot in your lungs known as a pulmonary embolus.  What do You need to know about Eliquis ? Take your Eliquis TWICE DAILY - one tablet in the morning and one tablet in the evening with or without food.  It would be best to take the doses about the same time each  day.  If you have difficulty swallowing the tablet whole please discuss with your pharmacist how to take the medication safely.  Take Eliquis exactly as prescribed by your doctor and DO NOT stop taking Eliquis without talking to the doctor who prescribed the medication.  Stopping may increase your risk of developing a new clot or stroke.  Refill your prescription before you run out.  After discharge, you should have regular check-up appointments with your healthcare provider that is prescribing your Eliquis.  In the future your dose may need to be changed if your kidney function or weight changes by a significant amount or as you get older.  What do you do if you miss a dose? If you miss a dose, take it as soon as you remember on the same day and resume taking twice daily.  Do not take more than one dose of ELIQUIS at the same time.  Important Safety Information A possible side effect of Eliquis is bleeding. You should call your healthcare provider right away if you experience any of the following: Bleeding from an injury or your nose that does not stop. Unusual colored urine (red or dark brown) or unusual colored stools (red or black). Unusual bruising for unknown reasons. A serious fall or if you hit your head (even if there is no bleeding).  Some medicines may interact with Eliquis and might increase your risk of bleeding or clotting while on Eliquis. To help avoid this, consult your healthcare provider or pharmacist prior to using any new prescription  or non-prescription medications, including herbals, vitamins, non-steroidal anti-inflammatory drugs (NSAIDs) and supplements.  This website has more information on Eliquis (apixaban): [email or web address].com.cy.

## 2021-06-23 NOTE — Evaluation (Signed)
Occupational Therapy Evaluation Patient Details Name: Ashley [name] MRN: [id number] DOB: May 15, 1946 Today's Date: 06/23/2021   History of Present Illness Ashley [name] is a 75 year old woman admitted 06/19/21 with worsening heart failure symptoms found to be in heart failure with Afib refractory to medical therapy planned for TEE cardioversion but BP dropped during procedure so aborted. Combination of TTE and TEE views do confirm no ventriclar or atrial clots.  Severe right ventricular systolic dysfunction and dilation noted.  Plan for CTA chest. Pt intubated 06/21/21. PMH includes: HTN, HLD, DM2, atrial flutter and CVA   Clinical Impression   Pt lives alone and is independent at baseline. Her 6 nieces help as needed. Pt presents with generalized weakness, impaired standing balance, poor activity tolerance and decreased awareness of safety and deficits. She requires set up to max assist for ADL and min assist for OOB to chair. Activity limited due to HR to 131 with transfer this visit. Recommending ST rehab in SNF. Pt currently is refusing.      Recommendations for follow up therapy are one component of a multi-disciplinary discharge planning process, led by the attending physician.  Recommendations may be updated based on patient status, additional functional criteria and insurance authorization.   Follow Up Recommendations  SNF;Supervision/Assistance - 24 hour    Equipment Recommendations  3 in 1 bedside commode    Recommendations for Other Services       Precautions / Restrictions Precautions Precautions: Fall Precaution Comments: monitor HR      Mobility Bed Mobility Overal bed mobility: Needs Assistance Bed Mobility: Supine to Sit     Supine to sit: Min assist     General bed mobility comments: min assist to raise trunk, HOB up, increased time    Transfers Overall transfer level: Needs assistance Equipment used: Rolling walker (2 wheeled) Transfers: Sit to/from  [employer] Sit to Stand: Min guard Stand pivot transfers: Min assist       General transfer comment: steadying assist, pt with tendency to sit before aligned with sitting surface    Balance Overall balance assessment: Needs assistance   Sitting balance-Leahy Scale: Fair     Standing balance support: Bilateral upper extremity supported Standing balance-Leahy Scale: Poor Standing balance comment: reliant on RW                           ADL either performed or assessed with clinical judgement   ADL Overall ADL's : Needs assistance/impaired Eating/Feeding: Set up;Sitting   Grooming: Sitting;Set up   Upper Body Bathing: Minimal assistance;Sitting   Lower Body Bathing: Maximal assistance;Sit to/from stand   Upper Body Dressing : Minimal assistance;Sitting   Lower Body Dressing: Maximal assistance;Sit to/from stand   Toilet Transfer: Minimal assistance;Stand-pivot;BSC;RW   Toileting- Clothing Manipulation and Hygiene: Maximal assistance;Sit to/from stand               Vision Baseline Vision/History: 1 Wears glasses Patient Visual Report: No change from baseline       Perception     Praxis      Pertinent Vitals/Pain Pain Assessment: Faces Faces Pain Scale: No hurt     Hand Dominance Right   Extremity/Trunk Assessment Upper Extremity Assessment Upper Extremity Assessment: Generalized weakness   Lower Extremity Assessment Lower Extremity Assessment: Defer to PT evaluation   Cervical / Trunk Assessment Cervical / Trunk Assessment: Other exceptions Cervical / Trunk Exceptions: obesity   Communication Communication Communication: No difficulties  Cognition Arousal/Alertness: Awake/alert Behavior During Therapy: WFL for tasks assessed/performed Overall Cognitive Status: Impaired/Different from baseline Area of Impairment: Safety/judgement                         Safety/Judgement: Decreased awareness of  deficits;Decreased awareness of safety     General Comments: pt with some decreased insight into deficits and ability to manage at home independently   General Comments       Exercises     Shoulder Instructions      Home Living Family/patient expects to be discharged to:: Private residence Living Arrangements: Alone Available Help at Discharge: Family;Available PRN/intermittently (may be able to get niece to stay with her) Type of Home: House Home Access: Level entry     Home Layout: One level     Bathroom Shower/Tub: Producer, television/film/video: Handicapped height     Home Equipment: Shower seat          Prior Functioning/Environment Level of Independence: Independent        Comments: drives        OT Problem List: Impaired balance (sitting and/or standing);Decreased strength;Decreased safety awareness;Decreased knowledge of use of DME or AE;Cardiopulmonary status limiting activity;Obesity;Decreased activity tolerance      OT Treatment/Interventions: Self-care/ADL training;DME and/or AE instruction;Energy conservation;Therapeutic activities;Patient/family education;Balance training    OT Goals(Current goals can be found in the care plan section) Acute Rehab OT Goals Patient Stated Goal: to return home OT Goal Formulation: With patient Time For Goal Achievement: 07/07/21 Potential to Achieve Goals: Good  OT Frequency: Min 2X/week   Barriers to D/C: Decreased caregiver support          Co-evaluation PT/OT/SLP Co-Evaluation/Treatment: Yes Reason for Co-Treatment: Complexity of the patient's impairments (multi-system involvement);For patient/therapist safety   OT goals addressed during session: ADL's and self-care      AM-PAC OT "6 Clicks" Daily Activity     Outcome Measure Help from another person eating meals?: None Help from another person taking care of personal grooming?: A Little Help from another person toileting, which includes using  toliet, bedpan, or urinal?: A Lot Help from another person bathing (including washing, rinsing, drying)?: A Lot Help from another person to put on and taking off regular upper body clothing?: A Little Help from another person to put on and taking off regular lower body clothing?: A Lot 6 Click Score: 16   End of Session Equipment Utilized During Treatment: Rolling walker;Oxygen (4L)  Activity Tolerance: Treatment limited secondary to medical complications (Comment) (HR elevates to 131 with transfer) Patient left: in chair;with call bell/phone within reach  OT Visit Diagnosis: Unsteadiness on feet (R26.81);Other abnormalities of gait and mobility (R26.89);Muscle weakness (generalized) (M62.81)                Time: 1740-8144 OT Time Calculation (min): 28 min Charges:  OT General Charges $OT Visit: 1 Visit OT Evaluation $OT Eval Moderate Complexity: 1 Mod  Martie Round, OTR/L Acute Rehabilitation Services Pager: [phone] Office: [phone]  Evern Bio 06/23/2021, 12:31 PM

## 2021-06-23 NOTE — Progress Notes (Signed)
[facility] ADULT ICU REPLACEMENT PROTOCOL   The patient does apply for the [facility] Adult ICU Electrolyte Replacment Protocol based on the criteria listed below:   1.Exclusion criteria: TCTS patients, ECMO patients and Hypothermia Protocol, and   Dialysis patients 2. Is GFR >/= 30 ml/min? Yes.    Patient's GFR today is 47 3. Is SCr </= 2? No. Patient's SCr is 1.21 mg/dL 4. Did SCr increase >/= 0.5 in 24 hours? No. 5.Pt's weight >40kg  Yes.   6. Abnormal electrolyte(s): K+ 3.3  7. Electrolytes replaced per protocol 8.  Call MD STAT for K+ </= 2.5, Phos </= 1, or Mag </= 1 Physician:  n/a   Melvern Banker 06/23/2021 4:41 AM

## 2021-06-23 NOTE — Telephone Encounter (Signed)
MH referral Received: Today Lorin Glass, MD  P Lbpu Triage Pool Cc: Hunsucker, Lesia Sago, MD Nonurgent hospital f/u for Hunsucker (1-2 mo fine), multifactorial pulmonary HTN   Thanks,  Dan     Pt is currently admitted to the hospital. Pt's HFU with Dr. Judeth Horn has been scheduled so that way when pt is discharged from the hospital, the appt will show up on AVS. If the appt needs to be changed, pt can call the office to get appt rescheduled. Nothing further needed.

## 2021-06-23 NOTE — TOC Initial Note (Addendum)
Transition of Care [facility]) - Initial/Assessment Note    Patient Details  Name: Ashley [name] MRN: [id number] Date of Birth: September 28, 1945  [facility] CM/SW Contact:    Delilah Shan, LCSWA Phone Number: 06/23/2021, 3:46 PM  Clinical Narrative:                  CSW received consult for possible SNF placement at time of discharge. CSW spoke with patient at bedside and patients niece regarding PT recommendation of SNF placement at time of discharge. Patient reports she comes from home with son. Patient expressed understanding of PT recommendation and declined  SNF placement at time of discharge. Patient reports her son can help provide 24/7 supervision at home. Patient is interested in [facility] services and would like for case manager to speak with her and her son before making final decision.CSW informed case Production designer, theatre/television/film.No further questions reported at this time.   Expected Discharge Plan: Home w Home Health Services (possily home or with home health services) Barriers to Discharge: Continued Medical Work up   Patient Goals and CMS Choice Patient states their goals for this hospitalization and ongoing recovery are:: to go home CMS Medicare.gov Compare Post Acute Care list provided to:: Patient Choice offered to / list presented to : Patient  Expected Discharge Plan and Services Expected Discharge Plan: Home w Home Health Services (possily home or with home health services) In-house Referral: Clinical Social Work     Living arrangements for the past 2 months: Single Family Home                                      Prior Living Arrangements/Services Living arrangements for the past 2 months: Single Family Home Lives with:: Self, Adult Children (patient lives with son) Patient language and need for interpreter reviewed:: Yes Do you feel safe going back to the place where you live?: Yes      Need for Family Participation in Patient Care: Yes (Comment) Care giver  support system in place?: Yes (comment)   Criminal Activity/Legal Involvement Pertinent to Current Situation/Hospitalization: No - Comment as needed  Activities of Daily Living Home Assistive Devices/Equipment: None ADL Screening (condition at time of admission) Patient's cognitive ability adequate to safely complete daily activities?: Yes Is the patient deaf or have difficulty hearing?: No Does the patient have difficulty seeing, even when wearing glasses/contacts?: No Does the patient have difficulty concentrating, remembering, or making decisions?: No Patient able to express need for assistance with ADLs?: Yes Does the patient have difficulty dressing or bathing?: No Independently performs ADLs?: Yes (appropriate for developmental age) Does the patient have difficulty walking or climbing stairs?: No Weakness of Legs: None Weakness of Arms/Hands: None  Permission Sought/Granted Permission sought to share information with : Case Manager, Family Supports, Magazine features editor                Emotional Assessment Appearance:: Appears stated age Attitude/Demeanor/Rapport: Gracious Affect (typically observed): Calm Orientation: : Oriented to Self, Oriented to Place, Oriented to  Time, Oriented to Situation Alcohol / Substance Use: Not Applicable Psych Involvement: No (comment)  Admission diagnosis:  Peripheral edema [R60.9] New onset atrial fibrillation (HCC) [I48.91] Atrial fibrillation with RVR (HCC) [I48.91] Exertional shortness of breath [R06.02] Patient Active Problem List   Diagnosis Date Noted   Pulmonary embolus (HCC)    Atrial fibrillation with RVR (HCC) 06/20/2021   New onset  atrial fibrillation (HCC) 06/19/2021   AKI (acute kidney injury) (HCC) 06/19/2021   Microcytosis 06/19/2021   Cerebral embolism with cerebral infarction 04/05/2020   Bradycardia 04/04/2020   Acute metabolic encephalopathy 04/04/2020   Elevated troponin 04/04/2020   Essential  hypertension 11/23/2018   Controlled diabetes mellitus type II without complication (HCC) 11/23/2018   Obesity 11/23/2018   PCP:  Merri Brunette, MD Pharmacy:   [facility] [address], [place] - [address] [place] [address] Phone: [phone] Fax: [phone]  [facility] [address] [place] [address] Phone: [phone] Fax: [phone]     Social Determinants of Health (SDOH) Interventions    Readmission Risk Interventions No flowsheet data found.

## 2021-06-23 NOTE — Progress Notes (Signed)
eLink Physician-Brief Progress Note Patient Name: Ashley [name] DOB: 1946/01/10 MRN: [id number]   Date of Service  06/23/2021  HPI/Events of Note  Patient is on an Amiodarone gtt for atrial fibrillation with RVR but her heart rate is still 130's to 140's.  eICU Interventions  Amiodarone 150 mg iv bolus ordered x 1.        Thomasene Lot Laurajean Hosek 06/23/2021, 3:43 AM

## 2021-06-23 NOTE — Progress Notes (Signed)
CH visited pt. while rounding in 2H; pt. says she was admitted to [facility] ED for a procedure which required an additional procedure which "didn't go well" leading pt. to current admission to [facility].  Pt. requested prayer for recovery and strength.  Chaplains remain available as needed.

## 2021-06-23 NOTE — Evaluation (Signed)
Physical Therapy Evaluation Patient Details Name: Ashley [name] MRN: [id number] DOB: [birth year] Today's Date: 06/23/2021  History of Present Illness  Ashley [name] is a 75 year old woman admitted 06/19/21 with worsening heart failure symptoms found to be in heart failure with Afib refractory to medical therapy planned for TEE cardioversion but BP dropped during procedure so aborted. Combination of TTE and TEE views do confirm no ventriclar or atrial clots.  Severe right ventricular systolic dysfunction and dilation noted.  Plan for CTA chest. Pt intubated 06/21/21. PMH includes: HTN, HLD, DM2, atrial flutter and CVA  Clinical Impression  Patient presents with decreased mobility due to decreased activity tolerance, decreased safety awareness, decreased balance and generalized weakness.  Currently needing min A overall for up to chair from bed limited per nursing due to HR and BP.  Patient does fatigue, but also demonstrating decreased safety sitting prior to getting centered in chair.  She is refusing post-acute inpatient rehab, but feel she will need time to return to independent.  PT to continue to follow.  Currently recommending SNF level rehab due to limited caregiver support.        Recommendations for follow up therapy are one component of a multi-disciplinary discharge planning process, led by the attending physician.  Recommendations may be updated based on patient status, additional functional criteria and insurance authorization.  Follow Up Recommendations SNF;Supervision/Assistance - 24 hour    Equipment Recommendations  None recommended by PT    Recommendations for Other Services       Precautions / Restrictions Precautions Precautions: Fall Precaution Comments: monitor HR      Mobility  Bed Mobility Overal bed mobility: Needs Assistance Bed Mobility: Supine to Sit     Supine to sit: Min assist;HOB elevated     General bed mobility comments: min assist to raise  trunk, HOB up, increased time    Transfers Overall transfer level: Needs assistance Equipment used: Rolling walker (2 wheeled) Transfers: Sit to/from [employer] Sit to Stand: Min guard Stand pivot transfers: Min assist       General transfer comment: steadying assist, pt with tendency to sit before aligned with sitting surface  Ambulation/Gait                Stairs            Wheelchair Mobility    Modified Rankin (Stroke Patients Only)       Balance Overall balance assessment: Needs assistance   Sitting balance-Leahy Scale: Fair     Standing balance support: Bilateral upper extremity supported Standing balance-Leahy Scale: Poor Standing balance comment: reliant on RW                             Pertinent Vitals/Pain Pain Assessment: Faces Faces Pain Scale: No hurt    Home Living Family/patient expects to be discharged to:: Private residence Living Arrangements: Alone Available Help at Discharge: Family;Available PRN/intermittently (may be able to get neice to stay) Type of Home: House Home Access: Level entry     Home Layout: One level Home Equipment: Shower seat      Prior Function Level of Independence: Independent         Comments: drives     Hand Dominance   Dominant Hand: Right    Extremity/Trunk Assessment   Upper Extremity Assessment Upper Extremity Assessment: Defer to OT evaluation    Lower Extremity Assessment Lower Extremity Assessment: RLE deficits/detail;LLE deficits/detail RLE  Deficits / Details: AROM WFL, strength hip flexion 3-/5, knee extension 4-/5, ankle DF 4+/5 RLE Sensation: WNL LLE Deficits / Details: AAROM WFL, strength hip flexion 2/5, knee extension 3+/5, ankle DF 4/5 LLE Sensation: WNL    Cervical / Trunk Assessment Cervical / Trunk Assessment: Other exceptions Cervical / Trunk Exceptions: obesity  Communication   Communication: No difficulties  Cognition  Arousal/Alertness: Awake/alert Behavior During Therapy: WFL for tasks assessed/performed Overall Cognitive Status: Impaired/Different from baseline Area of Impairment: Safety/judgement                         Safety/Judgement: Decreased awareness of safety;Decreased awareness of deficits     General Comments: pt with some decreased insight into deficits and ability to manage at home independently      General Comments General comments (skin integrity, edema, etc.): some edema in periphery    Exercises     Assessment/Plan    PT Assessment Patient needs continued PT services  PT Problem List Decreased strength;Decreased mobility;Decreased safety awareness;Decreased balance;Decreased activity tolerance;Decreased knowledge of use of DME;Cardiopulmonary status limiting activity       PT Treatment Interventions DME instruction;Therapeutic activities;Patient/family education;Therapeutic exercise;Gait training;Stair training;Balance training;Functional mobility training    PT Goals (Current goals can be found in the Care Plan section)  Acute Rehab PT Goals Patient Stated Goal: to return home PT Goal Formulation: With patient Time For Goal Achievement: 07/07/21 Potential to Achieve Goals: Fair    Frequency Min 3X/week   Barriers to discharge Decreased caregiver support      Co-evaluation PT/OT/SLP Co-Evaluation/Treatment: Yes Reason for Co-Treatment: For patient/therapist safety;To address functional/ADL transfers PT goals addressed during session: Mobility/safety with mobility;Balance;Proper use of DME OT goals addressed during session: ADL's and self-care       AM-PAC PT "6 Clicks" Mobility  Outcome Measure Help needed turning from your back to your side while in a flat bed without using bedrails?: None Help needed moving from lying on your back to sitting on the side of a flat bed without using bedrails?: A Little Help needed moving to and from a bed to a chair  (including a wheelchair)?: A Little Help needed standing up from a chair using your arms (e.g., wheelchair or bedside chair)?: A Little Help needed to walk in hospital room?: A Little Help needed climbing 3-5 steps with a railing? : A Little 6 Click Score: 19    End of Session   Activity Tolerance: Treatment limited secondary to medical complications (Comment) (RN wanted in room only due to some issues with BP/HR) Patient left: in chair;with call bell/phone within reach   PT Visit Diagnosis: Other abnormalities of gait and mobility (R26.89);Muscle weakness (generalized) (M62.81)    Time: 4235-3614 PT Time Calculation (min) (ACUTE ONLY): 28 min   Charges:   PT Evaluation $PT Eval Moderate Complexity: 1 Mod          Cyndi Giulietta Prokop, PT Acute Rehabilitation Services [phone] Office:[phone] 06/23/2021   Elray Mcgregor 06/23/2021, 1:22 PM

## 2021-06-23 NOTE — Progress Notes (Signed)
   NAME:  Ashley [name], MRN:  [id number], DOB:  29-Jul-1946, LOS: 3 ADMISSION DATE:  06/19/2021, CONSULTATION DATE:  06/21/21 REFERRING MD:  Rosemary Holms, CHIEF COMPLAINT:  SOB, leg swelling   History of Present Illness:  75 year old woman with hx of HTN, HLD, DM2, atrial flutter and prior stroke presenting with worsening heart failure symptoms found to be in florid heart failure with Afib refractory to medical therapy planned for TEE cardioversion today.  Unfortunately BP dropped quite a bit during procedure so aborted. Combination of TTE and TEE views do confirm no ventriclar or atrial clots.  Severe right ventricular systolic dysfunction and dilation noted.  Plan for CTA chest and admission to ICU.  Pertinent  Medical History  DM2 Afib/flutter CKD 3a HTN HLD Prior stroke  Significant Hospital Events: Including procedures, antibiotic start and stop dates in addition to other pertinent events   10/3 TEE started with plans of cardioversion complicated by patient becoming severely hypotensive and hypoxic requiring brief period of CPR for possible PEA and intubation.  Cardioversion was not performed. 10/3 systemic TPA, PICC 10/4 extubated  Interim History / Subjective:  No events. Weak, O2 needs are okay, breathing stable, still afib/RVR, off pressors.  Objective   Blood pressure 109/77, pulse 91, temperature 99 F (37.2 C), resp. rate (!) 21, height 5\' 7"  (1.702 m), weight 121 kg, SpO2 99 %. CVP:  [9 mmHg-14 mmHg] 14 mmHg      Intake/Output Summary (Last 24 hours) at 06/23/2021 0858 Last data filed at 06/23/2021 0800 Gross per 24 hour  Intake 678.38 ml  Output 1035 ml  Net -356.62 ml    Filed Weights   06/19/21 1800 06/20/21 0546  Weight: 121 kg 121 kg    Examination: No distress Lungs with crackles bases, small R effusion with 08/20/21 Moves all 4 ext to command Global anasarca, needs diuresis  CBC okay Cr holding steady   Resolved Hospital Problem list    None  Assessment & Plan:  Massive PE with some signs of chronic underlying pulmonary HTN s/p systemic TPA 10/3  Volume overload  Stroke, 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  Mild AKI  - Continue heparin/amio gtt; I still think may benefit from cardioversion, would prefer to do this in ICU with A line in place, will d/w cardiology - If not cardioversion candidate, would consider digoxin and transitioning to eliquis - Needs diuresis but defer to cardiology - PT/OT/IS/chair - Sleep study if she is willing to use PAP - Potential transfer out of ICU to [facility] depending on discussion with cardiology  Best Practice (right click and "Reselect all SmartList Selections" daily)   Diet/type: cardiac DVT prophylaxis: systemic heparin GI prophylaxis: PPI Lines: N/A Foley:  Yes, and it is still needed Code Status:  full code Last date of multidisciplinary goals of care discussion [10/3]   [facility] MD [place] [facility]  Prefer epic messenger for cross cover needs If after hours, please call E-link

## 2021-06-23 NOTE — Progress Notes (Signed)
Lower extremity venous bilateral study completed.  Preliminary results relayed to Smith, MD.  See CV Proc for preliminary results report.   Sidi Dzikowski, RDMS, RVT  

## 2021-06-23 NOTE — Progress Notes (Signed)
ANTICOAGULATION CONSULT NOTE - Follow Up Consult  Pharmacy Consult for heparin Indication: afib, PE  Patient Measurements: Height: 5\' 7"  (170.2 cm) Weight: 121 kg (266 lb 12.1 oz) IBW/kg (Calculated) : 61.6 Heparin Dosing Weight: 90 kg  Labs: Recent Labs    06/21/21 1600 06/21/21 1830 06/21/21 1832 06/21/21 2018 06/22/21 0016 06/22/21 0302 06/22/21 1439 06/22/21 2110 06/23/21 0310 06/23/21 0447  HGB 11.7*   < > 12.1  --  11.6*  --   --   --  11.9*  --   HCT 36.0   < > 37.0  --  36.5  --   --   --  36.3  --   PLT 201  --  192  --  197  --   --   --  194  --   APTT 55*  --  39*  --   --   --   --   --   --   --   LABPROT 18.9*  --  20.2*  --   --   --   --   --   --   --   INR 1.6*  --  1.7*  --   --   --   --   --   --   --   HEPARINUNFRC  --   --   --   --   --   --    < > 0.17* >1.10* 0.37  CREATININE  --   --   --  1.32*  --  1.26*  --   --  1.21*  --   TROPONINIHS 62*  --   --   --   --   --   --   --   --   --    < > = values in this interval not displayed.     Assessment: 75 years of age female with atrial fibrillation on IV Heparin therapy. Patient with pulmonary embolism on CTA with severe RA/RV enlargement to receive IV Alteplase.   Alteplase given 10/3 1647  Heparin held and restarted post IV Alteplase once aPTT <80 seconds. Note heparin was therapeutic prior to being held at 800 units/hr (~8 units/kg/hr). Discussed with MD that patient did have a stroke in November 2021 and ok to proceed.   After heparin resumed patient experienced oozing from IV site and resumed at 650 units/hour. Then developed BRB coming from OGT ~84ml. Heparin held, restarted after bleeding improved - heparin restarted with no bolus at 650 units/hr.   Heparin level now therapeutic at 0.37. Heparin running at 1000 units/hr via PICC and heparin level drawn from peripheral on opposite arm.  No more bleeding issues, discussed with RN. H/h stable.   No plan to DCCV this admission, so will  transition to apixiban for PE.  Plan:  Stop heparin Start apixiban 10 mg BID x 7 days then 5 mg BID Daily CBC  45m, PharmD PGY2 Cardiology Pharmacy Resident Phone: [phone] 06/23/2021  9:11 AM  Please check [email or web address] for unit-specific pharmacy phone numbers.

## 2021-06-23 NOTE — Progress Notes (Addendum)
eLink Physician-Brief Progress Note Patient Name: Ashley [name] DOB: [birth year] MRN: [id number]   Date of Service  06/23/2021  HPI/Events of Note  Per bedside RN patient sounds congested with crackles.  eICU Interventions  Lasix 20 mg iv x 1 ordered. Incentive spirometer ordered.        [name] 06/23/2021, 12:42 AM

## 2021-06-23 NOTE — Progress Notes (Signed)
Subjective:  Feels much better, shortness of breath has improved significantly.  She is sitting up in the bed.  No chest pain.  Still continues to have leg swelling.  Intake/Output from previous day:  I/O last 3 completed shifts: In: 1473.9 [I.V.:1195.2; IV Piggyback:278.7] Out: 9150 [Urine:1275; Emesis/NG output:170] Total I/O In: 68.3 [I.V.:26.7; IV Piggyback:41.6] Out: 65 [Urine:65]  Blood pressure (!) 120/93, pulse (!) 102, temperature 98.8 F (37.1 C), resp. rate 18, height '5\' 7"'  (1.702 m), weight 121 kg, SpO2 98 %. Physical Exam Constitutional:      Comments: Morbidly obese in no acute distress.  HENT:     Head: Atraumatic.  Neck:     Vascular: No carotid bruit or JVD.  Cardiovascular:     Rate and Rhythm: Tachycardia present. Rhythm irregular.     Heart sounds: Normal heart sounds. No murmur heard.   No gallop. No S3 or S4 sounds.     Comments: Unable to feel her femoral or popliteal pulse due to body habitus, pedal pulses could not be felt due to significant leg edema, soft blood pressure and tachycardia.  Doppler pulses heard at the pedal site. Pulmonary:     Effort: Pulmonary effort is normal.     Breath sounds: Normal breath sounds.  Abdominal:     General: Bowel sounds are normal.     Palpations: Abdomen is soft.     Comments: Obese. Pannus present  Musculoskeletal:        General: No swelling.     Right lower leg: Edema (2-3+ pitting) present.     Left lower leg: Edema (2-3+ pitting) present.    Lab Results: BMP BNP (last 3 results) Recent Labs    06/19/21 1145  BNP 958.1*    ProBNP (last 3 results) No results for input(s): PROBNP in the last 8760 hours. BMP Latest Ref Rng & Units 06/23/2021 06/22/2021 06/21/2021  Glucose 70 - 99 mg/dL 130(H) 163(H) 150(H)  BUN 8 - 23 mg/dL 41(H) 43(H) 44(H)  Creatinine 0.44 - 1.00 mg/dL 1.21(H) 1.26(H) 1.32(H)  Sodium 135 - 145 mmol/L 137 135 135  Potassium 3.5 - 5.1 mmol/L 3.3(L) 4.5 3.2(L)  Chloride 98 - 111 mmol/L  100 101 101  CO2 22 - 32 mmol/L '28 25 22  ' Calcium 8.9 - 10.3 mg/dL 8.7(L) 8.6(L) 8.7(L)   Hepatic Function Latest Ref Rng & Units 06/19/2021 04/06/2020 04/05/2020  Total Protein 6.5 - 8.1 g/dL 7.1 7.1 7.0  Albumin 3.5 - 5.0 g/dL 3.7 3.4(L) 3.5  AST 15 - 41 U/L 31 53(H) 64(H)  ALT 0 - 44 U/L 36 38 43  Alk Phosphatase 38 - 126 U/L 89 64 61  Total Bilirubin 0.3 - 1.2 mg/dL 1.8(H) 1.1 0.7   CBC Latest Ref Rng & Units 06/23/2021 06/22/2021 06/21/2021  WBC 4.0 - 10.5 K/uL 10.4 8.8 8.1  Hemoglobin 12.0 - 15.0 g/dL 11.9(L) 11.6(L) 12.1  Hematocrit 36.0 - 46.0 % 36.3 36.5 37.0  Platelets 150 - 400 K/uL 194 197 192   Lipid Panel     Component Value Date/Time   CHOL 163 04/05/2020 0334   TRIG 95 06/22/2021 0302   HDL 63 04/05/2020 0334   CHOLHDL 2.6 04/05/2020 0334   VLDL 13 04/05/2020 0334   LDLCALC 87 04/05/2020 0334   Cardiac Panel (last 3 results) No results for input(s): CKTOTAL, CKMB, TROPONINI, RELINDX in the last 72 hours.  HEMOGLOBIN A1C Lab Results  Component Value Date   HGBA1C 6.2 (H) 04/04/2020   MPG 131.24 04/04/2020  TSH Recent Labs    06/19/21 1229  TSH 1.412   Imaging: CT Angio Chest Pulmonary Embolism (PE) W or WO Contrast  Result Date: 06/21/2021 CLINICAL DATA:  High suspicion for pulmonary embolus. Respiratory failure. EXAM: CT ANGIOGRAPHY CHEST WITH CONTRAST TECHNIQUE: Multidetector CT imaging of the chest was performed using the standard protocol during bolus administration of intravenous contrast. Multiplanar CT image reconstructions and MIPs were obtained to evaluate the vascular anatomy. CONTRAST:  25m OMNIPAQUE IOHEXOL 350 MG/ML SOLN COMPARISON:  Chest radiograph 06/19/2021 FINDINGS: Cardiovascular: There is abnormal filling defect in the right upper lobe pulmonary artery, right middle lobe pulmonary artery, and right lower lobe pulmonary artery compatible with acute pulmonary embolus. Questionable subsegmental embolus in the left lower lobe pulmonary artery.  Right ventricular left ventricular ratio 1.2. Moderate cardiomegaly noted. Mediastinum/Nodes: Left supraclavicular node 0.9 cm in short axis on image 11 series 5. Lungs/Pleura: Moderate right pleural effusion with associated passive atelectasis. Patchy airspace opacity in the right middle lobe and right lower lobe potentially from pulmonary hemorrhage or pneumonia. Moderate atelectasis in the left lower lobe. Endotracheal tube satisfactorily positioned in the trachea. Upper Abdomen: Unremarkable Musculoskeletal: Unremarkable Review of the MIP images confirms the above findings. IMPRESSION: 1. Substantial pulmonary embolus in the right lung with lobar involvement of the upper lobe, lower lobe, and middle lobe pulmonary arteries. Positive for acute PE with CT evidence of right heart strain (RV/LV Ratio = 1.2) consistent with at least submassive (intermediate risk) PE. The presence of right heart strain has been associated with an increased risk of morbidity and mortality. Please refer to the "PE Focused" order set in EPIC. 2. Moderate cardiomegaly. 3. Moderate right pleural effusion. 4. Patchy airspace opacity in the right middle lobe and right lower lobe could be from hemorrhage or pneumonia. 5. Moderate atelectasis in the left lower lobe. Critical Value/emergent results were called by telephone at the time of interpretation on 06/21/2021 at 3:37 pm to provider Dr. DIna Homes, who verbally acknowledged these results. Electronically Signed   By: WVan ClinesM.D.   On: 06/21/2021 15:38   DG CHEST PORT 1 VIEW  Result Date: 06/22/2021 CLINICAL DATA:  Right-sided PICC line placement. EXAM: PORTABLE CHEST 1 VIEW COMPARISON:  June 19, 2021 FINDINGS: An endotracheal tube is noted with its distal tip approximately 5.1 cm from the carina. A nasogastric tube is present with its distal end overlying the distal aspect of the esophagus. A right-sided PICC line is seen. Its distal tip is noted at the junction of the  superior vena cava and right atrium. Mild to moderate severity areas of bibasilar atelectasis and/or infiltrate are seen. There are small bilateral pleural effusions. No pneumothorax is identified. The cardiac silhouette is markedly enlarged and unchanged in size the visualized skeletal structures are unremarkable. IMPRESSION: 1. Mild to moderate severity bibasilar atelectasis and/or infiltrate. 2. Small bilateral pleural effusions. 3. A right-sided PICC line is seen with its distal tip at the junction of the superior vena cava and right atrium. 4. Endotracheal and nasogastric tube positioning, as described above. Further advancement of the NG tube by approximately 8 cm is recommended to decrease the risk of aspiration. Electronically Signed   By: TVirgina NorfolkM.D.   On: 06/22/2021 00:08   ECHOCARDIOGRAM COMPLETE  Result Date: 06/22/2021    ECHOCARDIOGRAM REPORT   [facility] Medical Rec #:  [id number]           Height:  67.0 in Accession #:    [id number]           Weight:       266.8 lb Date of Birth:  1946-08-12            BSA:          2.286 m Patient Age:    82 years             BP:           109/84 mmHg Patient Gender: F                    HR:           116 bpm. Exam Location:  Inpatient Procedure: 2D Echo, Color Doppler, Cardiac Doppler and Intracardiac            Opacification Agent Indications:    Atrial fibrillation I48.91  History:        Patient has prior history of Echocardiogram examinations, most                 recent 06/21/2021. Risk Factors:Hypertension.  Sonographer:    Merrie Roof RDCS Referring Phys: [id number] [place]  1. Left ventricular ejection fraction, by estimation, is 40 to 45%. The left ventricle has mildly decreased function. LVEF is likely underestimated due to interentricular septal flatterning in systole and diastole s/o RV pressure and volume overload. There is moderate left ventricular hypertrophy.  Left ventricular diastolic function could not be evaluated. There is the interventricular septum is flattened in systole and diastole, consistent with right ventricular pressure and volume overload.  2. Right ventricular systolic function is moderately reduced. The right ventricular size is mildly enlarged. There is mildly elevated pulmonary artery systolic pressure.  3. Left atrial size was severely dilated.  4. Right atrial size was severely dilated.  5. A small pericardial effusion is present. There is no evidence of cardiac tamponade.  6. The mitral valve is grossly normal. Moderate to severe mitral valve regurgitation.  7. Tricuspid valve regurgitation is severe.  8. The aortic valve is tricuspid. Aortic valve regurgitation is not visualized.  9. Above abnormalities are new compared to previous TTE in 2021. FINDINGS  Left Ventricle: Left ventricular ejection fraction, by estimation, is 40 to 45%. The left ventricle has mildly decreased function. The left ventricle demonstrates regional wall motion abnormalities. The left ventricular internal cavity size was normal in size. There is moderate left ventricular hypertrophy. The interventricular septum is flattened in systole and diastole, consistent with right ventricular pressure and volume overload. Left ventricular diastolic function could not be evaluated. Right Ventricle: The right ventricular size is mildly enlarged. No increase in right ventricular wall thickness. Right ventricular systolic function is moderately reduced. There is mildly elevated pulmonary artery systolic pressure. The tricuspid regurgitant velocity is 2.63 m/s, and with an assumed right atrial pressure of 15 mmHg, the estimated right ventricular systolic pressure is 27.0 mmHg. Left Atrium: Left atrial size was severely dilated. Right Atrium: Right atrial size was severely dilated. Pericardium: A small pericardial effusion is present. There is no evidence of cardiac tamponade. Mitral Valve:  The mitral valve is grossly normal. Moderate to severe mitral valve regurgitation. Tricuspid Valve: The tricuspid valve is grossly normal. Tricuspid valve regurgitation is severe. Aortic Valve: The aortic valve is tricuspid. Aortic valve regurgitation is not visualized. Pulmonic Valve: The pulmonic valve was grossly normal. Pulmonic valve regurgitation is mild. Aorta: The aortic root is normal in size and  structure. Venous: The inferior vena cava was not well visualized. IAS/Shunts: No atrial level shunt detected by color flow Doppler.  LEFT VENTRICLE PLAX 2D LVIDd:         4.70 cm LVIDs:         4.10 cm LV PW:         1.40 cm LV IVS:        1.40 cm LVOT diam:     2.10 cm LV SV:         28 LV SV Index:   12 LVOT Area:     3.46 cm  LV Volumes (MOD) LV vol d, MOD A2C: 151.0 ml LV vol d, MOD A4C: 126.0 ml LV vol s, MOD A2C: 93.5 ml LV vol s, MOD A4C: 65.3 ml LV SV MOD A2C:     57.5 ml LV SV MOD A4C:     126.0 ml LV SV MOD BP:      53.8 ml RIGHT VENTRICLE            IVC RV Basal diam:  4.40 cm    IVC diam: 2.60 cm RV Mid diam:    2.70 cm RV S prime:     8.49 cm/s TAPSE (M-mode): 1.2 cm LEFT ATRIUM              Index       RIGHT ATRIUM           Index LA diam:        5.40 cm  2.36 cm/m  RA Area:     33.10 cm LA Vol (A2C):   131.0 ml 57.32 ml/m RA Volume:   134.00 ml 58.63 ml/m LA Vol (A4C):   127.0 ml 55.57 ml/m LA Biplane Vol: 144.0 ml 63.00 ml/m  AORTIC VALVE LVOT Vmax:   60.10 cm/s LVOT Vmean:  41.800 cm/s LVOT VTI:    0.081 m  AORTA Ao Root diam: 2.80 cm Ao Asc diam:  3.90 cm TRICUSPID VALVE TR Peak grad:   27.7 mmHg TR Vmax:        263.00 cm/s  SHUNTS Systemic VTI:  0.08 m Systemic Diam: 2.10 cm Vernell Leep MD Electronically signed by Vernell Leep MD Signature Date/Time: 06/22/2021/5:28:07 PM    Final    ECHO TEE  Result Date: 06/21/2021    TRANSESOPHOGEAL ECHO REPORT   Patient Name:   Ashley [name] [facility] Date of Exam: 06/21/2021 Medical Rec #:  [id number]            Height:       67.0 in  Accession #:    [id number]           Weight:       266.8 lb Date of Birth:  10/20/1945            BSA:          2.286 m Patient Age:    75 years             BP:           109/93 mmHg Patient Gender: F                    HR:           106 bpm. Exam Location:  Inpatient Procedure: Transesophageal Echo Indications:     atrial fibrillation  History:         Patient has prior history of Echocardiogram examinations.  Sonographer:     Johny Chess  RDCS Referring Phys:  [id number] [facility] Diagnosing Phys: Vernell Leep MD PROCEDURE: After discussion of the risks and benefits of a TEE, an informed consent was obtained from the patient. The transesophogeal probe was passed without difficulty through the esophogus of the patient. Sedation performed by different physician. The patient developed Hypxic respiratory failure, PEA arrest during the procedure. IMPRESSIONS  1. Left ventricular ejection fraction, by estimation, is 30 to 35%. The left ventricle has moderately decreased function. Left ventricular endocardial border not optimally defined to evaluate regional wall motion.  2. Right ventricular systolic function is severely reduced. The right ventricular size is severely enlarged.  3. Left atrial size was mildly dilated. No left atrial/left atrial appendage thrombus was detected.  4. Right atrial size was severely dilated.  5. A small pericardial effusion is present.  6. The mitral valve is grossly normal. Mild mitral valve regurgitation.  7. Tricuspid valve regurgitation is severe.  8. The aortic valve is tricuspid. Aortic valve regurgitation is not visualized.  9. RV enlargement is new, compared to previous study in 2021. LVEF assessment is limited due to RV enlargment and tachycardia. Conser repeat TTE in a few days. FINDINGS  Left Ventricle: LV not well vidualized due to RV enlargement. LVEF assessmentdifficult due to rapid ventricular rate. Left ventricular ejection fraction, by estimation, is 30 to  35%. The left ventricle has moderately decreased function. Left ventricular  endocardial border not optimally defined to evaluate regional wall motion. The left ventricular internal cavity size was normal in size. Right Ventricle: The right ventricular size is severely enlarged. No increase in right ventricular wall thickness. Right ventricular systolic function is severely reduced. Left Atrium: Left atrial size was mildly dilated. No left atrial/left atrial appendage thrombus was detected. Right Atrium: Right atrial size was severely dilated. Pericardium: A small pericardial effusion is present. Mitral Valve: The mitral valve is grossly normal. Mild mitral valve regurgitation. Tricuspid Valve: The tricuspid valve is grossly normal. Tricuspid valve regurgitation is severe. Aortic Valve: The aortic valve is tricuspid. Aortic valve regurgitation is not visualized. Pulmonic Valve: The pulmonic valve was not well visualized. Pulmonic valve regurgitation is not visualized. Aorta: The aortic root was not well visualized. IAS/Shunts: No atrial level shunt detected by color flow Doppler. Vernell Leep MD Electronically signed by Vernell Leep MD Signature Date/Time: 06/21/2021/4:23:10 PM    Final    [place] EKG SITE RITE  Result Date: 06/21/2021 If Site Rite image not attached, placement could not be confirmed due to current cardiac rhythm.   Cardiac Studies:  EKG 07/08/2021: Atrial fibrillation with rapid ventricular response at rate of 175 bpm, anteroseptal infarct old, diffuse nonspecific ST-T abnormality.  Echocardiogram 04/05/2020:  1. Left ventricular ejection fraction, by estimation, is 55 to 60%. The  left ventricle has normal function. The left ventricle has no regional  wall motion abnormalities. There is mild left ventricular hypertrophy.  Left ventricular diastolic parameters  are indeterminate.   2. Right ventricular systolic function is normal. The right ventricular  size is normal.   3.  Left atrial size was moderately dilated.   4. Nosignificant valvular abnormality.   5. Agitated saline contrast bubble study was negative, with no evidence  of any interatrial shunt.   TEE 06/22/2021: 1. Left ventricular ejection fraction, by estimation, is 40 to 45%. The left ventricle has mildly decreased function. LVEF is likely underestimated due to interentricular septal flatterning in systole and diastole s/o RV pressure and volume overload.  There is moderate left ventricular hypertrophy.  Left ventricular diastolic function could not be evaluated. There is the interventricular septum is flattened in systole and diastole, consistent with right ventricular pressure and volume overload.  2. Right ventricular systolic function is moderately reduced. The right ventricular size is mildly enlarged. There is mildly elevated pulmonary artery systolic pressure.  3. Left atrial size was severely dilated.  4. Right atrial size was severely dilated.  5. A small pericardial effusion is present. There is no evidence of cardiac tamponade.  6. The mitral valve is grossly normal. Moderate to severe mitral valve regurgitation.  7. Tricuspid valve regurgitation is severe. The tricuspid  regurgitant velocity is 2.63 m/s, and with an assumed right atrial pressure of 15 mmHg, the estimated right ventricular systolic pressure is 08.0 mmHg.  8. The aortic valve is tricuspid. Aortic valve regurgitation is not visualized.  9. Above abnormalities are new compared to previous TTE in 2021.    Scheduled Meds:  apixaban  10 mg Oral BID   Followed by   Derrill Memo ON 06/30/2021] apixaban  5 mg Oral BID   Chlorhexidine Gluconate Cloth  6 each Topical Daily   cholecalciferol  5,000 Units Oral Daily   [START ON 06/24/2021] digoxin  0.125 mg Oral Daily   digoxin  0.25 mg Oral Once   diltiazem  30 mg Oral Q8H   [START ON 06/24/2021] furosemide  40 mg Intravenous Daily   [START ON 06/24/2021] influenza vaccine adjuvanted  0.5  mL Intramuscular Tomorrow-1000   insulin aspart  0-15 Units Subcutaneous Q4H   mouth rinse  15 mL Mouth Rinse BID   metoprolol tartrate  25 mg Oral BID   pantoprazole (PROTONIX) IV  40 mg Intravenous Q12H   potassium chloride  40 mEq Oral Once   rosuvastatin  5 mg Oral Daily   sodium chloride flush  10-40 mL Intracatheter Q12H   sodium chloride flush  3 mL Intravenous Q12H   spironolactone  25 mg Oral Daily   Continuous Infusions:  sodium chloride 10 mL/hr at 06/21/21 1900   sodium chloride Stopped (06/22/21 0302)   potassium chloride 10 mEq (06/23/21 0902)   promethazine (PHENERGAN) injection (IM or IVPB)     PRN Meds:.sodium chloride, acetaminophen **OR** acetaminophen, potassium chloride, sodium chloride flush, sodium chloride flush  Assessment/Plan:  1.  Massive PE with cardiogenic syncope: CT Chest 06/21/2021: Substantial pulmonary embolus in the right lung with lobar involvement of the upper lobe, lower lobe, and middle lobe pulmonary arteries.  2.  A. fib with RVR 3.  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 fib with RVR.  She is now back on Eliquis at PE dose, discontinue aspirin.  I will add diltiazem plain 30 mg tablets 3 times daily along with metoprolol tartrate 25 mg p.o. twice daily as her blood pressure is now stable.  I will also add digoxin 0.125 mg daily for A. fib rate control and switch her to p.o. amiodarone.  Today there does not appear to be acute decompensated heart failure although she does have 3+ bilateral leg edema.  I will also add spironolactone 25 mg daily, will reduce the dose of furosemide to 40 mg daily and will follow-up on the BMP.  Will check lower extremity venous duplex.  Patient can be transferred to the floor.  This is a 40 minutes of critical care, greater than 50% of the time spent on direct face-to-face interaction with the patient discussing life-threatening situation.  Decisions  regarding complex arrhythmias,  heart failure and renal insufficiency.    Adrian Prows, MD, [facility] 06/23/2021, 9:50 AM Office: [phone] Fax: [phone] Pager: [phone]

## 2021-06-23 NOTE — TOC Benefit Eligibility Note (Signed)
Patient Product/process development scientist completed.    The patient is currently admitted and upon discharge could be taking Farxiga 10 mg.  The current 30 day co-pay is, $0.00.   The patient is currently admitted and upon discharge could be taking Jardiance 10 mg.  The current 30 day co-pay is, $0.00.   The patient is insured through [employer]     Roland Earl, CPhT Pharmacy Patient Advocate Specialist [facility] Antimicrobial Stewardship Team Direct Number: [phone]  Fax: [phone]

## 2021-06-24 ENCOUNTER — Other Ambulatory Visit: Payer: Self-pay | Admitting: Cardiology

## 2021-06-24 DIAGNOSIS — I4891 Unspecified atrial fibrillation: Secondary | ICD-10-CM | POA: Diagnosis not present

## 2021-06-24 DIAGNOSIS — N179 Acute kidney failure, unspecified: Secondary | ICD-10-CM | POA: Diagnosis not present

## 2021-06-24 DIAGNOSIS — E119 Type 2 diabetes mellitus without complications: Secondary | ICD-10-CM

## 2021-06-24 LAB — GLUCOSE, CAPILLARY
Glucose-Capillary: 125 mg/dL — ABNORMAL HIGH (ref 70–99)
Glucose-Capillary: 130 mg/dL — ABNORMAL HIGH (ref 70–99)
Glucose-Capillary: 134 mg/dL — ABNORMAL HIGH (ref 70–99)
Glucose-Capillary: 142 mg/dL — ABNORMAL HIGH (ref 70–99)

## 2021-06-24 LAB — CBC
HCT: 36.3 % (ref 36.0–46.0)
Hemoglobin: 11.8 g/dL — ABNORMAL LOW (ref 12.0–15.0)
MCH: 24.3 pg — ABNORMAL LOW (ref 26.0–34.0)
MCHC: 32.5 g/dL (ref 30.0–36.0)
MCV: 74.8 fL — ABNORMAL LOW (ref 80.0–100.0)
Platelets: 188 10*3/uL (ref 150–400)
RBC: 4.85 MIL/uL (ref 3.87–5.11)
RDW: 17.9 % — ABNORMAL HIGH (ref 11.5–15.5)
WBC: 11.1 10*3/uL — ABNORMAL HIGH (ref 4.0–10.5)
nRBC: 0.3 % — ABNORMAL HIGH (ref 0.0–0.2)

## 2021-06-24 LAB — BASIC METABOLIC PANEL
Anion gap: 7 (ref 5–15)
BUN: 37 mg/dL — ABNORMAL HIGH (ref 8–23)
CO2: 28 mmol/L (ref 22–32)
Calcium: 9 mg/dL (ref 8.9–10.3)
Chloride: 101 mmol/L (ref 98–111)
Creatinine, Ser: 1.09 mg/dL — ABNORMAL HIGH (ref 0.44–1.00)
GFR, Estimated: 53 mL/min — ABNORMAL LOW (ref >60)
Glucose, Bld: 136 mg/dL — ABNORMAL HIGH (ref 70–99)
Potassium: 4.3 mmol/L (ref 3.5–5.1)
Sodium: 136 mmol/L (ref 135–145)

## 2021-06-24 LAB — MAGNESIUM: Magnesium: 2 mg/dL (ref 1.7–2.4)

## 2021-06-24 MED ORDER — METOPROLOL TARTRATE 25 MG PO TABS: 25.0000 mg | ORAL_TABLET | Freq: Three times a day (TID) | ORAL | Status: DC

## 2021-06-24 MED ORDER — METOPROLOL TARTRATE 50 MG PO TABS
50.0000 mg | ORAL_TABLET | Freq: Three times a day (TID) | ORAL | Status: DC
  Administered 2021-06-24 (×2): 50 mg via ORAL
  Filled 2021-06-24 (×3): qty 1

## 2021-06-24 MED ORDER — DIGOXIN 125 MCG PO TABS
0.2500 mg | ORAL_TABLET | Freq: Every day | ORAL | Status: DC
  Administered 2021-06-25 – 2021-06-27 (×3): 0.25 mg via ORAL
  Filled 2021-06-24 (×3): qty 2

## 2021-06-24 MED ORDER — ENSURE ENLIVE PO LIQD
237.0000 mL | Freq: Two times a day (BID) | ORAL | Status: DC
  Administered 2021-06-24: 237 mL via ORAL

## 2021-06-24 MED FILL — Epinephrine Soln Prefilled Syringe 0.1 MG/10ML (10 MCG/ML): INTRAVENOUS | Qty: 10 | Status: AC

## 2021-06-24 NOTE — Progress Notes (Signed)
PROGRESS NOTE        PATIENT DETAILS Name: Ashley [name] Age: 75 y.o. Sex: female Date of Birth: [birth year] Admit Date: 06/19/2021 Admitting Physician Dorcas Carrow, MD SAY:TKZSW, Zollie Beckers, MD  Brief Narrative: Patient is a 75 y.o. female with history of DM-2, HTN, HLD,CVA, atrial tachycardia-admitted with A. fib with RVR-subsequently evaluated by cardiology-underwent TEE (limited study showed severe RV dysfunction) which was complicated by severe hypoxia/hypotension-intermittent PEA-requiring emergent intubation and transfer to the ICU.  Further work-up revealed submassive PE-patient was given tPA.  Upon stability-patient was transferred to the [facility] service on 10/6.  Major events: 10/1>> admit for A. fib RVR 10/3>> while at TEE-severely hypotensive/hypoxic-intermittent PEA-intubated-transferred to ICU 10/3>> CT angio showed submassive PE-received systemic tPA 10/4>> extubated 10/6>> transfer to [facility]  Subjective: Lying comfortably in bed-denies any chest pain or shortness of breath.  Still with significant swelling in legs.  Objective: Vitals: Blood pressure 116/79, pulse (!) 103, temperature 98.1 F (36.7 C), resp. rate (!) 24, height 5\' 7"  (1.702 m), weight 124 kg, SpO2 92 %.   Exam: Gen Exam:Alert awake-not in any distress HEENT:atraumatic, normocephalic Chest: B/L clear to auscultation anteriorly CVS:S1S2 irregular Abdomen:soft non tender, non distended Extremities:++ edema Neurology: Non focal Skin: no rash  Pertinent Labs/Radiology: WBC: 11.1 Hb: 11.8 PLT: 188 Na: 136 K: 4.3 Creatinine: 1.09  10/1>> TSH: 1.4  10/2>>Urine Culture: Multiple species  10/3>> CT angio chest: Submassive PE, moderate right pleural effusion 10/3>> Limited TEE: EF 30-35%, RV systolic function severely reduced. 10/4>> TTE: EF 40-45%, RV systolic function moderately reduced, small pericardial effusion. 10/5>> bilateral lower extremity Doppler: Acute DVT  right common femoral vein, age-indeterminate DVT involving left posterior tibial/left peroneal vein.  Assessment/Plan: A. fib with RVR: Rate relatively well controlled-on amiodarone, digoxin, Cardizem and metoprolol.  Anticoagulated with Eliquis-cardiology following-we will await further recommendations.  HFrEF with exacerbation: Volume overloaded-some amount of lower extremity edema is from DVT as well.  Continue IV Lasix-Aldactone-follow electrolytes/volume status.  Acute hypoxic respiratory failure due to submassive PE: Intubated on 10/3-extubated on 10/4-Stable on 3-4 L of nasal cannula.  Continue to titrate down FiO2.  Submassive PE with RV strain and PEA arrest: S/p tPA on 10/3-currently on Eliquis.  HLD: Continue statin  DM-2: CBG stable-continue SSI  Recent Labs    06/23/21 1553 06/23/21 2241 06/24/21 0703  GLUCAP 104* 131* 130*    History of cryptogenic CVA  Debility/deconditioning: Await PT/OT eval.  Other issues: Remove Foley catheter today-voiding trial.  Morbid Obesity: Estimated body mass index is 42.82 kg/m as calculated from the following:   Height as of this encounter: 5\' 7"  (1.702 m).   Weight as of this encounter: 124 kg.    Procedures: None Consults: PCCM, cardiology DVT Prophylaxis: Eliquis Code Status:Full code Family Communication: Son at bedside  Time spent: 43 minutes-Greater than 50% of this time was spent in counseling, explanation of diagnosis, planning of further management, and coordination of care.  Diet: Diet Order             DIET DYS 3 Room service appropriate? Yes; Fluid consistency: Thin  Diet effective now                      Disposition Plan: Status is: Inpatient  Remains inpatient appropriate because:Inpatient level of care appropriate due to severity of illness  Dispo: The patient is  from: Home              Anticipated d/c is to: SNF              Patient currently is not medically stable to d/c.   Difficult to  place patient No     Barriers to Discharge: A. fib RVR-rate not adequately controlled-decompensated heart failure still with volume overload-not yet stable for discharge.  Antimicrobial agents: Anti-infectives (From admission, onward)    None        MEDICATIONS: Scheduled Meds:  amiodarone  200 mg Oral BID   apixaban  10 mg Oral BID   Followed by   Melene Muller ON 06/30/2021] apixaban  5 mg Oral BID   Chlorhexidine Gluconate Cloth  6 each Topical Daily   cholecalciferol  5,000 Units Oral Daily   digoxin  0.125 mg Oral Daily   diltiazem  30 mg Oral Q8H   furosemide  40 mg Intravenous Daily   influenza vaccine adjuvanted  0.5 mL Intramuscular Tomorrow-1000   insulin aspart  0-15 Units Subcutaneous TID PC & HS   mouth rinse  15 mL Mouth Rinse BID   metoprolol tartrate  25 mg Oral BID   pantoprazole  40 mg Oral BID   potassium chloride  40 mEq Oral Once   rosuvastatin  5 mg Oral Daily   sodium chloride flush  10-40 mL Intracatheter Q12H   sodium chloride flush  3 mL Intravenous Q12H   spironolactone  25 mg Oral Daily   Continuous Infusions:  sodium chloride 10 mL/hr at 06/21/21 1900   sodium chloride Stopped (06/22/21 0302)   promethazine (PHENERGAN) injection (IM or IVPB)     PRN Meds:.sodium chloride, acetaminophen **OR** acetaminophen, guaiFENesin-dextromethorphan, potassium chloride, sodium chloride flush, sodium chloride flush   I have personally reviewed following labs and imaging studies  LABORATORY DATA: CBC: Recent Labs  Lab 06/19/21 1145 06/20/21 0045 06/21/21 1600 06/21/21 1830 06/21/21 1832 06/22/21 0016 06/23/21 0310 06/24/21 0358  WBC 6.7   < > 8.0  --  8.1 8.8 10.4 11.1*  NEUTROABS 4.6  --   --   --   --   --   --   --   HGB 13.6   < > 11.7* 14.6 12.1 11.6* 11.9* 11.8*  HCT 42.2   < > 36.0 43.0 37.0 36.5 36.3 36.3  MCV 75.5*   < > 75.0*  --  74.6* 75.6* 74.7* 74.8*  PLT 189   < > 201  --  192 197 194 188   < > = values in this interval not  displayed.    Basic Metabolic Panel: Recent Labs  Lab 06/19/21 1145 06/20/21 0045 06/21/21 1151 06/21/21 1447 06/21/21 1830 06/21/21 2018 06/22/21 0302 06/23/21 0310 06/24/21 0358  NA 139   < > 138   < > 139 135 135 137 136  K 3.8   < > 3.6   < > 3.4* 3.2* 4.5 3.3* 4.3  CL 100   < > 99  --   --  101 101 100 101  CO2 25   < > 26  --   --  GLUCOSE 149*   < > 121*  --   --  150* 163* 130* 136*  BUN 55*   < > 45*  --   --  44* 43* 41* 37*  CREATININE 1.45*   < > 1.44*  --   --  1.32* 1.26* 1.21* 1.09*  CALCIUM 10.1   < > 9.3  --   --  8.7* 8.6* 8.7* 9.0  MG 2.2  --   --   --   --  1.9  --   --  2.0   < > = values in this interval not displayed.    GFR: Estimated Creatinine Clearance: 61.9 mL/min (A) (by C-G formula based on SCr of 1.09 mg/dL (H)).  Liver Function Tests: Recent Labs  Lab 06/19/21 1145  AST 31  ALT 36  ALKPHOS 89  BILITOT 1.8*  PROT 7.1  ALBUMIN 3.7   No results for input(s): LIPASE, AMYLASE in the last 168 hours. Recent Labs  Lab 06/19/21 1255  AMMONIA 27    Coagulation Profile: Recent Labs  Lab 06/21/21 1600 06/21/21 1832  INR 1.6* 1.7*    Cardiac Enzymes: No results for input(s): CKTOTAL, CKMB, CKMBINDEX, TROPONINI in the last 168 hours.  BNP (last 3 results) No results for input(s): PROBNP in the last 8760 hours.  Lipid Profile: Recent Labs    06/22/21 0302  TRIG 95    Thyroid Function Tests: No results for input(s): TSH, T4TOTAL, FREET4, T3FREE, THYROIDAB in the last 72 hours.  Anemia Panel: No results for input(s): VITAMINB12, FOLATE, FERRITIN, TIBC, IRON, RETICCTPCT in the last 72 hours.  Urine analysis:    Component Value Date/Time   COLORURINE YELLOW 06/19/2021 1520   APPEARANCEUR CLEAR 06/19/2021 1520   LABSPEC 1.021 06/19/2021 1520   PHURINE 5.0 06/19/2021 1520   GLUCOSEU NEGATIVE 06/19/2021 1520   HGBUR TRACE (A) 06/19/2021 1520   BILIRUBINUR NEGATIVE 06/19/2021 1520   KETONESUR NEGATIVE  06/19/2021 1520   PROTEINUR 30 (A) 06/19/2021 1520   NITRITE NEGATIVE 06/19/2021 1520   LEUKOCYTESUR SMALL (A) 06/19/2021 1520    Sepsis Labs: Lactic Acid, Venous    Component Value Date/Time   LATICACIDVEN 2.0 (HH) 06/21/2021 1832    MICROBIOLOGY: Recent Results (from the past 240 hour(s))  Resp Panel by RT-PCR (Flu A&B, Covid) Nasopharyngeal Swab     Status: None   Collection Time: 06/19/21 12:29 PM   Specimen: Nasopharyngeal Swab; Nasopharyngeal(NP) swabs in vial transport medium  Result Value Ref Range Status   SARS Coronavirus 2 by RT PCR NEGATIVE NEGATIVE Final    Comment: (NOTE) SARS-CoV-2 target nucleic acids are NOT DETECTED.  The SARS-CoV-2 RNA is generally detectable in upper respiratory specimens during the acute phase of infection. The lowest concentration of SARS-CoV-2 viral copies this assay can detect is 138 copies/mL. A negative result does not preclude SARS-Cov-2 infection and should not be used as the sole basis for treatment or other patient management decisions. A negative result may occur with  improper specimen collection/handling, submission of specimen other than nasopharyngeal swab, presence of viral mutation(s) within the areas targeted by this assay, and inadequate number of viral copies(<138 copies/mL). A negative result must be combined with clinical observations, patient history, and epidemiological information. The expected result is Negative.  Fact Sheet for Patients:  BloggerCourse.com  Fact Sheet for Healthcare Providers:  SeriousBroker.it  This test is no t yet approved or cleared by the [place] FDA and  has been authorized for detection and/or diagnosis of SARS-CoV-2 by FDA under an Emergency Use Authorization (EUA). This EUA will remain  in effect (meaning this test can be used) for the duration of the COVID-19 declaration under Section 564(b)(1) of the Act, 21 U.S.C.section  360bbb-3(b)(1), unless the authorization is terminated  or revoked sooner.       Influenza  A by PCR NEGATIVE NEGATIVE Final   Influenza B by PCR NEGATIVE NEGATIVE Final    Comment: (NOTE) The Xpert Xpress SARS-CoV-2/FLU/RSV plus assay is intended as an aid in the diagnosis of influenza from Nasopharyngeal swab specimens and should not be used as a sole basis for treatment. Nasal washings and aspirates are unacceptable for Xpert Xpress SARS-CoV-2/FLU/RSV testing.  Fact Sheet for Patients: BloggerCourse.com  Fact Sheet for Healthcare Providers: SeriousBroker.it  This test is not yet approved or cleared by the [place] FDA and has been authorized for detection and/or diagnosis of SARS-CoV-2 by FDA under an Emergency Use Authorization (EUA). This EUA will remain in effect (meaning this test can be used) for the duration of the COVID-19 declaration under Section 564(b)(1) of the Act, 21 U.S.C. section 360bbb-3(b)(1), unless the authorization is terminated or revoked.  Performed at [facility], [address], [place], [place] [address]   Urine Culture     Status: Abnormal   Collection Time: 06/20/21  1:37 AM   Specimen: Urine, Clean Catch  Result Value Ref Range Status   Specimen Description URINE, CLEAN CATCH  Final   Special Requests   Final    NONE Performed at [facility], [address] [place], [place] [address]    Culture MULTIPLE SPECIES PRESENT, SUGGEST RECOLLECTION (A)  Final   Report Status 06/21/2021 FINAL  Final  MRSA Next Gen by PCR, Nasal     Status: None   Collection Time: 06/21/21  2:54 AM   Specimen: Nasal Mucosa; Nasal Swab  Result Value Ref Range Status   MRSA by PCR Next Gen NOT DETECTED NOT DETECTED Final    Comment: (NOTE) The GeneXpert MRSA Assay (FDA approved for NASAL specimens only), is one component of a comprehensive MRSA colonization surveillance program. It is  not intended to diagnose MRSA infection nor to guide or monitor treatment for MRSA infections. Test performance is not FDA approved in patients less than 21 years old. Performed at [facility], [address], [place], [place] [address]     RADIOLOGY STUDIES/RESULTS: ECHOCARDIOGRAM COMPLETE  Result Date: 06/22/2021    ECHOCARDIOGRAM REPORT   Patient Name:   Ashley [name] [facility] Date of Exam: 06/22/2021 Medical Rec #:  [id number]            Height:       67.0 in Accession #:    [id number]           Weight:       266.8 lb Date of Birth:  12/06/1945            BSA:          2.286 m Patient Age:    74 years             BP:           109/84 mmHg Patient Gender: F                    HR:           116 bpm. Exam Location:  Inpatient Procedure: 2D Echo, Color Doppler, Cardiac Doppler and Intracardiac            Opacification Agent Indications:    Atrial fibrillation I48.91  History:        Patient has prior history of Echocardiogram examinations, most                 recent 06/21/2021. Risk Factors:Hypertension.  Sonographer:    Roosvelt Maser RDCS Referring Phys: [id number] [facility] J PATWARDHAN IMPRESSIONS  1. Left ventricular ejection fraction, by estimation, is 40 to 45%. The left ventricle has mildly decreased function. LVEF is likely underestimated due to interentricular septal flatterning in systole and diastole s/o RV pressure and volume overload. There is moderate left ventricular hypertrophy. Left ventricular diastolic function could not be evaluated. There is the interventricular septum is flattened in systole and diastole, consistent with right ventricular pressure and volume overload.  2. Right ventricular systolic function is moderately reduced. The right ventricular size is mildly enlarged. There is mildly elevated pulmonary artery systolic pressure.  3. Left atrial size was severely dilated.  4. Right atrial size was severely dilated.  5. A small pericardial effusion is present. There is no evidence of  cardiac tamponade.  6. The mitral valve is grossly normal. Moderate to severe mitral valve regurgitation.  7. Tricuspid valve regurgitation is severe.  8. The aortic valve is tricuspid. Aortic valve regurgitation is not visualized.  9. Above abnormalities are new compared to previous TTE in 2021. FINDINGS  Left Ventricle: Left ventricular ejection fraction, by estimation, is 40 to 45%. The left ventricle has mildly decreased function. The left ventricle demonstrates regional wall motion abnormalities. The left ventricular internal cavity size was normal in size. There is moderate left ventricular hypertrophy. The interventricular septum is flattened in systole and diastole, consistent with right ventricular pressure and volume overload. Left ventricular diastolic function could not be evaluated. Right Ventricle: The right ventricular size is mildly enlarged. No increase in right ventricular wall thickness. Right ventricular systolic function is moderately reduced. There is mildly elevated pulmonary artery systolic pressure. The tricuspid regurgitant velocity is 2.63 m/s, and with an assumed right atrial pressure of 15 mmHg, the estimated right ventricular systolic pressure is 42.7 mmHg. Left Atrium: Left atrial size was severely dilated. Right Atrium: Right atrial size was severely dilated. Pericardium: A small pericardial effusion is present. There is no evidence of cardiac tamponade. Mitral Valve: The mitral valve is grossly normal. Moderate to severe mitral valve regurgitation. Tricuspid Valve: The tricuspid valve is grossly normal. Tricuspid valve regurgitation is severe. Aortic Valve: The aortic valve is tricuspid. Aortic valve regurgitation is not visualized. Pulmonic Valve: The pulmonic valve was grossly normal. Pulmonic valve regurgitation is mild. Aorta: The aortic root is normal in size and structure. Venous: The inferior vena cava was not well visualized. IAS/Shunts: No atrial level shunt detected by  color flow Doppler.  LEFT VENTRICLE PLAX 2D LVIDd:         4.70 cm LVIDs:         4.10 cm LV PW:         1.40 cm LV IVS:        1.40 cm LVOT diam:     2.10 cm LV SV:         28 LV SV Index:   12 LVOT Area:     3.46 cm  LV Volumes (MOD) LV vol d, MOD A2C: 151.0 ml LV vol d, MOD A4C: 126.0 ml LV vol s, MOD A2C: 93.5 ml LV vol s, MOD A4C: 65.3 ml LV SV MOD A2C:     57.5 ml LV SV MOD A4C:     126.0 ml LV SV MOD BP:      53.8 ml RIGHT VENTRICLE            IVC RV Basal diam:  4.40 cm    IVC diam: 2.60 cm  RV Mid diam:    2.70 cm RV S prime:     8.49 cm/s TAPSE (M-mode): 1.2 cm LEFT ATRIUM              Index       RIGHT ATRIUM           Index LA diam:        5.40 cm  2.36 cm/m  RA Area:     33.10 cm LA Vol (A2C):   131.0 ml 57.32 ml/m RA Volume:   134.00 ml 58.63 ml/m LA Vol (A4C):   127.0 ml 55.57 ml/m LA Biplane Vol: 144.0 ml 63.00 ml/m  AORTIC VALVE LVOT Vmax:   60.10 cm/s LVOT Vmean:  41.800 cm/s LVOT VTI:    0.081 m  AORTA Ao Root diam: 2.80 cm Ao Asc diam:  3.90 cm TRICUSPID VALVE TR Peak grad:   27.7 mmHg TR Vmax:        263.00 cm/s  SHUNTS Systemic VTI:  0.08 m Systemic Diam: 2.10 cm Truett Mainland MD Electronically signed by Truett Mainland MD Signature Date/Time: 06/22/2021/5:28:07 PM    Final    VAS [place] LOWER EXTREMITY VENOUS (DVT)  Result Date: 06/23/2021  Lower Venous DVT Study Patient Name:  Ashley [name] [facility]  Date of Exam:   06/23/2021 Medical Rec #: [id number]             Accession #:    [id number] Date of Birth: [birth year]             Patient Gender: F Patient Age:   60 years Exam Location:  [facility] Procedure:      VAS [place] LOWER EXTREMITY VENOUS (DVT) Referring Phys: Yates Decamp --------------------------------------------------------------------------------  Indications: Pulmonary embolism.  Anticoagulation: Heparin. Limitations: Poor ultrasound/tissue interface and body habitus. Comparison Study: No prior studies. Performing Technologist: Jean Rosenthal RDMS, RVT  Examination  Guidelines: A complete evaluation includes B-mode imaging, spectral Doppler, color Doppler, and power Doppler as needed of all accessible portions of each vessel. Bilateral testing is considered an integral part of a complete examination. Limited examinations for reoccurring indications may be performed as noted. The reflux portion of the exam is performed with the patient in reverse Trendelenburg.  +---------+---------------+---------+-----------+----------+-------------------+ RIGHT    CompressibilityPhasicitySpontaneityPropertiesThrombus Aging      +---------+---------------+---------+-----------+----------+-------------------+ CFV      Partial        Yes      Yes                  Acute               +---------+---------------+---------+-----------+----------+-------------------+ SFJ      Partial                                                          +---------+---------------+---------+-----------+----------+-------------------+ FV Prox                 Yes      Yes                                      +---------+---------------+---------+-----------+----------+-------------------+ FV Mid                  Yes  Yes                                      +---------+---------------+---------+-----------+----------+-------------------+ FV Distal               Yes      Yes                                      +---------+---------------+---------+-----------+----------+-------------------+ PFV                     Yes      Yes                                      +---------+---------------+---------+-----------+----------+-------------------+ POP                     Yes      Yes                                      +---------+---------------+---------+-----------+----------+-------------------+ PTV                                                   Not well visualized +---------+---------------+---------+-----------+----------+-------------------+ PERO                                                   Not well visualized +---------+---------------+---------+-----------+----------+-------------------+   +---------+---------------+---------+-----------+----------+--------------+ LEFT     CompressibilityPhasicitySpontaneityPropertiesThrombus Aging +---------+---------------+---------+-----------+----------+--------------+ CFV      Full           Yes      Yes                                 +---------+---------------+---------+-----------+----------+--------------+ SFJ      Full                                                        +---------+---------------+---------+-----------+----------+--------------+ FV Prox  Full                                                        +---------+---------------+---------+-----------+----------+--------------+ FV Mid   Full                                                        +---------+---------------+---------+-----------+----------+--------------+ FV DistalFull                                                        +---------+---------------+---------+-----------+----------+--------------+  PFV      Full                                                        +---------+---------------+---------+-----------+----------+--------------+ POP      Full           Yes      Yes                                 +---------+---------------+---------+-----------+----------+--------------+ PTV      None                                                        +---------+---------------+---------+-----------+----------+--------------+ PERO     None                                                        +---------+---------------+---------+-----------+----------+--------------+     Summary: RIGHT: - Findings consistent with acute deep vein thrombosis involving the right common femoral vein. - No cystic structure found in the popliteal fossa.  LEFT: - Findings consistent  with age indeterminate deep vein thrombosis involving the left posterior tibial veins, and left peroneal veins. - No cystic structure found in the popliteal fossa.  *See table(s) above for measurements and observations. Electronically signed by Coral Else MD on 06/23/2021 at 4:53:11 PM.    Final      LOS: 4 days   Jeoffrey Massed, MD  [facility]    To contact the attending provider between 7A-7P or the covering provider during after hours 7P-7A, please log into the web site [email or web address] and access using universal [place] password for that web site. If you do not have the password, please call the hospital operator.  06/24/2021, 10:41 AM

## 2021-06-24 NOTE — Progress Notes (Signed)
Initial Nutrition Assessment  DOCUMENTATION CODES:   Obesity unspecified  INTERVENTION:   Ensure Enlive po BID, each supplement provides 350 kcal and 20 grams of protein   NUTRITION DIAGNOSIS:   Inadequate oral intake related to decreased appetite, early satiety as evidenced by per patient/family report.  GOAL:   Patient will meet greater than or equal to 90% of their needs  MONITOR:   PO intake, Supplement acceptance, Labs, Weight trends  REASON FOR ASSESSMENT:   Consult Assessment of nutrition requirement/status, Poor PO  ASSESSMENT:   75 yo female admitted with afib with RVR, CHF, acute respiratory failure with submassive PE. PMH includes HTN, stroke  10/01 Admitted 10/03 Intubated 10/04 Extubated  Pt reports poor appetite at present but starting to improve. Pt actually wanting to eat lunch today. Pt agreeable to Ensure supplements currently. Pt ate 50% at breakfast, eating lunch on visit. Chopped pork chop too tough for patient to eat, RN ordered tilapia for pt instead.   Pt reports feeling poorly for several weeks prior to admission with weight gain related to fluid. Pt reported early satiety and decreased appetite as well.   Current wt 124 kg; pt with significant edema on exam, unsure of dry weight  Labs: CBGs 104-131 Meds: lasix, ss novolog, Vit D   NUTRITION - FOCUSED PHYSICAL EXAM:  Flowsheet Row Most Recent Value  Orbital Region No depletion  Upper Arm Region No depletion  Thoracic and Lumbar Region No depletion  Buccal Region No depletion  Temple Region No depletion  Clavicle Bone Region No depletion  Clavicle and Acromion Bone Region No depletion  Scapular Bone Region No depletion  Dorsal Hand Unable to assess  Patellar Region Unable to assess  Anterior Thigh Region Unable to assess  Posterior Calf Region Unable to assess  Edema (RD Assessment) Severe       Diet Order:   Diet Order             DIET DYS 3 Room service appropriate? Yes;  Fluid consistency: Thin  Diet effective now                   EDUCATION NEEDS:   Education needs have been addressed  Skin:  Skin Assessment: Reviewed RN Assessment  Last BM:  10/5  Height:   Ht Readings from Last 1 Encounters:  06/21/21 5\' 7"  (1.702 m)    Weight:   Wt Readings from Last 1 Encounters:  06/24/21 124 kg     BMI:  Body mass index is 42.82 kg/m.  Estimated Nutritional Needs:   Kcal:  1800-2000 kcals  Protein:  90-100 g  Fluid:  >/= 1.8 L   08/24/21 MS, RDN, LDN, CNSC Registered Dietitian III Clinical Nutrition RD Pager and On-Call Pager Number Located in [place]

## 2021-06-24 NOTE — Progress Notes (Signed)
PT Cancellation Note  Patient Details Name: Ashley [name] MRN: [id number] DOB: [birth year]   Cancelled Treatment:    Reason Eval/Treat Not Completed: Patient declined, no reason specified this afternoon. PT attempting to see to progress mobility and further plan for d/c as noted that pt declining SNF rehab. Pt states she feels fatigued after transferring rooms and therefore would like to rest until tomorrow. PT will continue to follow and attempt to progress pt mobility to allow for safe d/c home to align with pt preference.   Vickki Muff, PT, DPT   Acute Rehabilitation Department Pager #: [phone]  [name] 06/24/2021, 5:13 PM

## 2021-06-24 NOTE — Progress Notes (Signed)
Subjective:  Patient has been having mild hemoptysis.  States that her leg edema is still present but slightly improved from yesterday.  Intake/Output from previous day:  I/O last 3 completed shifts: In: 481.7 [I.V.:361.5; IV Piggyback:120.3] Out: 2263 [Urine:1850] Total I/O In: 240 [P.O.:240] Out: -   Blood pressure 126/84, pulse (!) 121, temperature 97.6 F (36.4 C), temperature source Oral, resp. rate 20, height '5\' 7"'  (1.702 m), weight 124 kg, SpO2 98 %. Physical Exam Constitutional:      Comments: Morbidly obese in no acute distress.  HENT:     Head: Atraumatic.  Neck:     Vascular: JVD present. No carotid bruit.  Cardiovascular:     Rate and Rhythm: Tachycardia present. Rhythm irregular.     Heart sounds: Normal heart sounds. No murmur heard.   No gallop. No S3 or S4 sounds.     Comments: Unable to feel her femoral or popliteal pulse due to body habitus, pedal pulses could not be felt due to significant leg edema, soft blood pressure and tachycardia.  Doppler pulses heard at the pedal site. Pulmonary:     Effort: Pulmonary effort is normal.     Breath sounds: Normal breath sounds.  Abdominal:     General: Bowel sounds are normal.     Palpations: Abdomen is soft.     Comments: Obese. Pannus present  Musculoskeletal:        General: No swelling.     Right lower leg: Edema (2+ pitting) present.     Left lower leg: Edema (2+ pitting) present.    Lab Results: BMP BNP (last 3 results) Recent Labs    06/19/21 1145  BNP 958.1*    ProBNP (last 3 results) No results for input(s): PROBNP in the last 8760 hours. BMP Latest Ref Rng & Units 06/24/2021 06/23/2021 06/22/2021  Glucose 70 - 99 mg/dL 136(H) 130(H) 163(H)  BUN 8 - 23 mg/dL 37(H) 41(H) 43(H)  Creatinine 0.44 - 1.00 mg/dL 1.09(H) 1.21(H) 1.26(H)  Sodium 135 - 145 mmol/L 136 137 135  Potassium 3.5 - 5.1 mmol/L 4.3 3.3(L) 4.5  Chloride 98 - 111 mmol/L 101 100 101  CO2 22 - 32 mmol/L '28 28 25  ' Calcium 8.9 - 10.3  mg/dL 9.0 8.7(L) 8.6(L)   Hepatic Function Latest Ref Rng & Units 06/19/2021 04/06/2020 04/05/2020  Total Protein 6.5 - 8.1 g/dL 7.1 7.1 7.0  Albumin 3.5 - 5.0 g/dL 3.7 3.4(L) 3.5  AST 15 - 41 U/L 31 53(H) 64(H)  ALT 0 - 44 U/L 36 38 43  Alk Phosphatase 38 - 126 U/L 89 64 61  Total Bilirubin 0.3 - 1.2 mg/dL 1.8(H) 1.1 0.7   CBC Latest Ref Rng & Units 06/24/2021 06/23/2021 06/22/2021  WBC 4.0 - 10.5 K/uL 11.1(H) 10.4 8.8  Hemoglobin 12.0 - 15.0 g/dL 11.8(L) 11.9(L) 11.6(L)  Hematocrit 36.0 - 46.0 % 36.3 36.3 36.5  Platelets 150 - 400 K/uL 188 194 197   Lipid Panel     Component Value Date/Time   CHOL 163 04/05/2020 0334   TRIG 95 06/22/2021 0302   HDL 63 04/05/2020 0334   CHOLHDL 2.6 04/05/2020 0334   VLDL 13 04/05/2020 0334   LDLCALC 87 04/05/2020 0334   Cardiac Panel (last 3 results) No results for input(s): CKTOTAL, CKMB, TROPONINI, RELINDX in the last 72 hours.  HEMOGLOBIN A1C Lab Results  Component Value Date   HGBA1C 6.2 (H) 04/04/2020   MPG 131.24 04/04/2020   TSH Recent Labs    06/19/21 1229  TSH 1.412   Imaging: VAS [place] LOWER EXTREMITY VENOUS (DVT)  Result Date: 06/23/2021  Lower Venous DVT Study Patient Name:  Ashley [name] [facility]  Date of Exam:   06/23/2021 Medical Rec #: [id number]             Accession #:    [id number] Date of Birth: 1946/07/13             Patient Gender: F Patient Age:   25 years Exam Location:  [facility] Procedure:      VAS [place] LOWER EXTREMITY VENOUS (DVT) Referring Phys: Adrian Prows --------------------------------------------------------------------------------  Indications: Pulmonary embolism.  Anticoagulation: Heparin. Limitations: Poor ultrasound/tissue interface and body habitus. Comparison Study: No prior studies. Performing Technologist: Darlin Coco RDMS, RVT  Examination Guidelines: A complete evaluation includes B-mode imaging, spectral Doppler, color Doppler, and power Doppler as needed of all accessible portions of each vessel.  Bilateral testing is considered an integral part of a complete examination. Limited examinations for reoccurring indications may be performed as noted. The reflux portion of the exam is performed with the patient in reverse Trendelenburg.  +---------+---------------+---------+-----------+----------+-------------------+ RIGHT    CompressibilityPhasicitySpontaneityPropertiesThrombus Aging      +---------+---------------+---------+-----------+----------+-------------------+ CFV      Partial        Yes      Yes                  Acute               +---------+---------------+---------+-----------+----------+-------------------+ SFJ      Partial                                                          +---------+---------------+---------+-----------+----------+-------------------+ FV Prox                 Yes      Yes                                      +---------+---------------+---------+-----------+----------+-------------------+ FV Mid                  Yes      Yes                                      +---------+---------------+---------+-----------+----------+-------------------+ FV Distal               Yes      Yes                                      +---------+---------------+---------+-----------+----------+-------------------+ PFV                     Yes      Yes                                      +---------+---------------+---------+-----------+----------+-------------------+ POP                     Yes  Yes                                      +---------+---------------+---------+-----------+----------+-------------------+ PTV                                                   Not well visualized +---------+---------------+---------+-----------+----------+-------------------+ PERO                                                  Not well visualized +---------+---------------+---------+-----------+----------+-------------------+    +---------+---------------+---------+-----------+----------+--------------+ LEFT     CompressibilityPhasicitySpontaneityPropertiesThrombus Aging +---------+---------------+---------+-----------+----------+--------------+ CFV      Full           Yes      Yes                                 +---------+---------------+---------+-----------+----------+--------------+ SFJ      Full                                                        +---------+---------------+---------+-----------+----------+--------------+ FV Prox  Full                                                        +---------+---------------+---------+-----------+----------+--------------+ FV Mid   Full                                                        +---------+---------------+---------+-----------+----------+--------------+ FV DistalFull                                                        +---------+---------------+---------+-----------+----------+--------------+ PFV      Full                                                        +---------+---------------+---------+-----------+----------+--------------+ POP      Full           Yes      Yes                                 +---------+---------------+---------+-----------+----------+--------------+ PTV      None                                                        +---------+---------------+---------+-----------+----------+--------------+  PERO     None                                                        +---------+---------------+---------+-----------+----------+--------------+     Summary: RIGHT: - Findings consistent with acute deep vein thrombosis involving the right common femoral vein. - No cystic structure found in the popliteal fossa.  LEFT: - Findings consistent with age indeterminate deep vein thrombosis involving the left posterior tibial veins, and left peroneal veins. - No cystic structure found in the popliteal  fossa.  *See table(s) above for measurements and observations. Electronically signed by Harold Barban MD on 06/23/2021 at 4:53:11 PM.    Final     Cardiac Studies:  EKG 07/08/2021: Atrial fibrillation with rapid ventricular response at rate of 175 bpm, anteroseptal infarct old, diffuse nonspecific ST-T abnormality.  Echocardiogram 04/05/2020:  1. Left ventricular ejection fraction, by estimation, is 55 to 60%. The  left ventricle has normal function. The left ventricle has no regional  wall motion abnormalities. There is mild left ventricular hypertrophy.  Left ventricular diastolic parameters  are indeterminate.   2. Right ventricular systolic function is normal. The right ventricular  size is normal.   3. Left atrial size was moderately dilated.   4. Nosignificant valvular abnormality.   5. Agitated saline contrast bubble study was negative, with no evidence  of any interatrial shunt.   TEE 06/22/2021: 1. Left ventricular ejection fraction, by estimation, is 40 to 45%. The left ventricle has mildly decreased function. LVEF is likely underestimated due to interentricular septal flatterning in systole and diastole s/o RV pressure and volume overload.  There is moderate left ventricular hypertrophy. Left ventricular diastolic function could not be evaluated. There is the interventricular septum is flattened in systole and diastole, consistent with right ventricular pressure and volume overload.  2. Right ventricular systolic function is moderately reduced. The right ventricular size is mildly enlarged. There is mildly elevated pulmonary artery systolic pressure.  3. Left atrial size was severely dilated.  4. Right atrial size was severely dilated.  5. A small pericardial effusion is present. There is no evidence of cardiac tamponade.  6. The mitral valve is grossly normal. Moderate to severe mitral valve regurgitation.  7. Tricuspid valve regurgitation is severe. The tricuspid  regurgitant  velocity is 2.63 m/s, and with an assumed right atrial pressure of 15 mmHg, the estimated right ventricular systolic pressure is 13.2 mmHg.  8. The aortic valve is tricuspid. Aortic valve regurgitation is not visualized.  9. Above abnormalities are new compared to previous TTE in 2021.    Scheduled Meds:  amiodarone  200 mg Oral BID   apixaban  10 mg Oral BID   Followed by   Derrill Memo ON 06/30/2021] apixaban  5 mg Oral BID   Chlorhexidine Gluconate Cloth  6 each Topical Daily   cholecalciferol  5,000 Units Oral Daily   [START ON 06/25/2021] digoxin  0.25 mg Oral Daily   feeding supplement  237 mL Oral BID BM   furosemide  40 mg Intravenous Daily   influenza vaccine adjuvanted  0.5 mL Intramuscular Tomorrow-1000   insulin aspart  0-15 Units Subcutaneous TID PC & HS   mouth rinse  15 mL Mouth Rinse BID   metoprolol tartrate  50 mg Oral TID   pantoprazole  40 mg  Oral BID   potassium chloride  40 mEq Oral Once   rosuvastatin  5 mg Oral Daily   sodium chloride flush  10-40 mL Intracatheter Q12H   sodium chloride flush  3 mL Intravenous Q12H   spironolactone  25 mg Oral Daily   Continuous Infusions:  sodium chloride 10 mL/hr at 06/21/21 1900   sodium chloride Stopped (06/22/21 0302)   promethazine (PHENERGAN) injection (IM or IVPB)     PRN Meds:.sodium chloride, acetaminophen **OR** acetaminophen, guaiFENesin-dextromethorphan, potassium chloride, sodium chloride flush, sodium chloride flush  Assessment/Plan:  1.  Massive PE with cardiogenic syncope: CT Chest 06/21/2021: Substantial pulmonary embolus in the right lung with lobar involvement of the upper lobe, lower lobe, and middle lobe pulmonary arteries.  2.  A. fib with RVR 3.  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  We will increase digoxin to 0.25 mg daily, renal function  fortunately has remained stable in spite of addition of spironolactone.  Would not be too aggressive in diuresis as the right ventricle is volume dependent.  Will discontinue diltiazem in view of RV systolic dysfunction and heart rate has not improved, will increase metoprolol to 50 mg 3 times daily and put parameters for holding for heart rate <60 and SBP <90 mmHg.  We can trend her BMP over the next 2 to 3 days.  Mild to moderate hemoptysis which is expected is happening, no hemodynamic compromise from pulmonary embolism presently.   Adrian Prows, MD, [facility] 06/24/2021, 3:46 PM Office: [phone] Fax: [phone] Pager: [phone]

## 2021-06-24 NOTE — Progress Notes (Signed)
   06/24/21 1450  Assess: MEWS Score  Temp 97.6 F (36.4 C)  BP 126/84  Pulse Rate (!) 121  Resp 20  SpO2 98 %  O2 Device Nasal Cannula  O2 Flow Rate (L/min) 4 L/min  Assess: MEWS Score  MEWS Temp 0  MEWS Systolic 0  MEWS Pulse 2  MEWS RR 0  MEWS LOC 0  MEWS Score 2  MEWS Score Color Yellow  Assess: if the MEWS score is Yellow or Red  Were vital signs taken at a resting state? Yes  Focused Assessment No change from prior assessment (new admission)  Early Detection of Sepsis Score *See Row Information* Low  MEWS guidelines implemented *See Row Information* Yes  Treat  Pain Scale 0-10  Pain Score 0  Take Vital Signs  Increase Vital Sign Frequency  Yellow: Q 2hr X 2 then Q 4hr X 2, if remains yellow, continue Q 4hrs  Escalate  MEWS: Escalate Yellow: discuss with charge nurse/RN and consider discussing with provider and RRT  Notify: Charge Nurse/RN  Name of Charge Nurse/RN Notified Ship broker  Date Charge Nurse/RN Notified 06/24/21  Time Charge Nurse/RN Notified 1450   Patient arrived to 4E18 from 2H. CHG bath completed. Vital signs recorded. Patient placed on telemetry and CCMD notified. Bed low and locked with bed alarm engaged. Call bell and patient's belongings left within reach.

## 2021-06-25 LAB — CBC
HCT: 36.7 % (ref 36.0–46.0)
Hemoglobin: 11.4 g/dL — ABNORMAL LOW (ref 12.0–15.0)
MCH: 23.4 pg — ABNORMAL LOW (ref 26.0–34.0)
MCHC: 31.1 g/dL (ref 30.0–36.0)
MCV: 75.4 fL — ABNORMAL LOW (ref 80.0–100.0)
Platelets: 199 10*3/uL (ref 150–400)
RBC: 4.87 MIL/uL (ref 3.87–5.11)
RDW: 17.5 % — ABNORMAL HIGH (ref 11.5–15.5)
WBC: 9.3 10*3/uL (ref 4.0–10.5)
nRBC: 0.2 % (ref 0.0–0.2)

## 2021-06-25 LAB — BRAIN NATRIURETIC PEPTIDE: B Natriuretic Peptide: 726.8 pg/mL — ABNORMAL HIGH (ref 0.0–100.0)

## 2021-06-25 LAB — GLUCOSE, CAPILLARY
Glucose-Capillary: 119 mg/dL — ABNORMAL HIGH (ref 70–99)
Glucose-Capillary: 100 mg/dL — ABNORMAL HIGH (ref 70–99)
Glucose-Capillary: 132 mg/dL — ABNORMAL HIGH (ref 70–99)
Glucose-Capillary: 147 mg/dL — ABNORMAL HIGH (ref 70–99)

## 2021-06-25 LAB — HEMOGLOBIN A1C
Hgb A1c MFr Bld: 5.9 % — ABNORMAL HIGH (ref 4.8–5.6)
Mean Plasma Glucose: 122.63 mg/dL

## 2021-06-25 LAB — BASIC METABOLIC PANEL
Anion gap: 6 (ref 5–15)
BUN: 35 mg/dL — ABNORMAL HIGH (ref 8–23)
CO2: 27 mmol/L (ref 22–32)
Calcium: 9.1 mg/dL (ref 8.9–10.3)
Chloride: 103 mmol/L (ref 98–111)
Creatinine, Ser: 0.88 mg/dL (ref 0.44–1.00)
GFR, Estimated: >60 mL/min (ref >60)
Glucose, Bld: 106 mg/dL — ABNORMAL HIGH (ref 70–99)
Potassium: 4.5 mmol/L (ref 3.5–5.1)
Sodium: 136 mmol/L (ref 135–145)

## 2021-06-25 LAB — MAGNESIUM: Magnesium: 2.1 mg/dL (ref 1.7–2.4)

## 2021-06-25 MED ORDER — TORSEMIDE 20 MG PO TABS
20.0000 mg | ORAL_TABLET | Freq: Every day | ORAL | Status: DC
  Administered 2021-06-25 – 2021-06-26 (×2): 20 mg via ORAL
  Filled 2021-06-25 (×2): qty 1

## 2021-06-25 MED ORDER — METOPROLOL TARTRATE 100 MG PO TABS
100.0000 mg | ORAL_TABLET | Freq: Two times a day (BID) | ORAL | Status: DC
  Administered 2021-06-25 – 2021-06-27 (×5): 100 mg via ORAL
  Filled 2021-06-25 (×5): qty 1

## 2021-06-25 MED ORDER — INSULIN ASPART 100 UNIT/ML IJ SOLN
0.0000 [IU] | Freq: Three times a day (TID) | INTRAMUSCULAR | Status: DC
  Administered 2021-06-25 – 2021-06-27 (×3): 2 [IU] via SUBCUTANEOUS

## 2021-06-25 NOTE — Care Management Important Message (Signed)
Important Message  Patient Details  Name: Ashley [name] MRN: [id number] Date of Birth: 18-Aug-1946   Medicare Important Message Given:  Yes     Renie Ora 06/25/2021, 10:34 AM

## 2021-06-25 NOTE — Progress Notes (Addendum)
PROGRESS NOTE        PATIENT DETAILS Name: Ashley [name] Age: 75 y.o. Sex: female Date of Birth: 1946/04/11 Admit Date: 06/19/2021 Admitting Physician Dorcas Carrow, MD [id number], Zollie Beckers, MD  Brief Narrative: Patient is a 75 y.o. female with history of DM-2, HTN, HLD,CVA, atrial tachycardia-admitted with A. fib with RVR-subsequently evaluated by cardiology-underwent TEE  which was complicated by severe hypoxia/hypotension-intermittent PEA-requiring emergent intubation and transfer to the ICU.  TEE was limited but it did show severe RV dysfunction-subsequent CTA chest showed submassive PE-she was subsequently given systemic tPA.    Upon stability-patient was transferred to the [facility] service on 10/6.  Major events: 10/1>> admit for A. fib RVR 10/3>> while at TEE-severely hypotensive/hypoxic-intermittent PEA-intubated-transferred to ICU 10/3>> CT angio showed submassive PE-received systemic tPA 10/4>> extubated 10/6>> transfer to [facility]  Subjective: Feeling better-after I saw her-she finally was able to work with physical therapy for the first time during this hospitalization-heart rate was in the 140-150s range.  Objective: Vitals: Blood pressure 123/87, pulse (!) 115, temperature 97.8 F (36.6 C), temperature source Oral, resp. rate 20, height 5\' 7"  (1.702 m), weight 124 kg, SpO2 93 %.   Exam: Gen Exam:Alert awake-not in any distress HEENT:atraumatic, normocephalic Chest: B/L clear to auscultation anteriorly CVS:S1S2 regular Abdomen:soft non tender, non distended Extremities:++ edema Neurology: Non focal Skin: no rash   Pertinent Labs/Radiology: WBC: 9.3 Hb: 11.4 PLT: 199 Na: 136 K: 4.5 Creatinine: 0.88  10/1>> TSH: 1.4  10/2>>Urine Culture: Multiple species  10/3>> CT angio chest: Submassive PE, moderate right pleural effusion 10/3>> Limited TEE: EF 30-35%, RV systolic function severely reduced. 10/4>> TTE: EF 40-45%, RV systolic  function moderately reduced, small pericardial effusion. 10/5>> bilateral lower extremity Doppler: Acute DVT right common femoral vein, age-indeterminate DVT involving left posterior tibial/left peroneal vein.  Assessment/Plan: A. fib with RVR: Overall much improved-rate controlled at rest-but does creep up to the 140s-150s with ambulation (deconditioned as well).  Cardiology following-remains on amiodarone/digoxin and metoprolol.  Anticoagulated with Eliquis.  Discussed with Dr. unexpected to have tachycardia with ambulation given acute illness/deconditioning-plan is to continue to observe another 1-2 days before consideration of discharge.   HFrEF with exacerbation: Volume status gradually improving-she continues to have lower extremity edema-cardiology has switched her to oral Demadex-she remains on spironolactone.  Continue to follow closely.  Suspect some amount of lower extremity edema is from bilateral DVTs as well.   Acute hypoxic respiratory failure due to submassive PE: Intubated on 10/3-extubated on 10/4-she was on 2 L of oxygen this morning-have asked RN to titrate her FiO2 down as tolerated.    Bilateral lower extremity DVT along with submassive PE with RV strain/acute cor pulmonale and PEA arrest: S/p tPA on 10/3-currently on Eliquis.  Given significant clot burden-suspect may require indefinite anticoagulation.  HLD: Continue statin  DM-2: CBG stable-continue SSI  Recent Labs    06/24/21 2349 06/25/21 0753 06/25/21 1133  GLUCAP 125* 119* 100*     History of cryptogenic CVA  Debility/deconditioning: Due to acute illness-being evaluated by PT/OT with recommendations for home health on discharge.  Other issues:  Foley catheter removed on 10/6-voiding spontaneously.  Morbid Obesity: Estimated body mass index is 42.82 kg/m as calculated from the following:   Height as of this encounter: 5\' 7"  (1.702 m).   Weight as of this encounter: 124 kg.  Procedures:  None Consults: PCCM, cardiology DVT Prophylaxis: Eliquis Code Status:Full code Family Communication: None at bedside  Time spent: 35 minutes-Greater than 50% of this time was spent in counseling, explanation of diagnosis, planning of further management, and coordination of care.  Diet: Diet Order             DIET DYS 3 Room service appropriate? Yes; Fluid consistency: Thin  Diet effective now                      Disposition Plan: Status is: Inpatient  Remains inpatient appropriate because:Inpatient level of care appropriate due to severity of illness  Dispo: The patient is from: Home              Anticipated d/c is to: SNF              Patient currently is not medically stable to d/c.   Difficult to place patient No     Barriers to Discharge: A. fib RVR-rate not adequately controlled-decompensated heart failure still with volume overload-not yet stable for discharge.  Antimicrobial agents: Anti-infectives (From admission, onward)    None        MEDICATIONS: Scheduled Meds:  amiodarone  200 mg Oral BID   apixaban  10 mg Oral BID   Followed by   Melene Muller ON 06/30/2021] apixaban  5 mg Oral BID   Chlorhexidine Gluconate Cloth  6 each Topical Daily   cholecalciferol  5,000 Units Oral Daily   digoxin  0.25 mg Oral Daily   influenza vaccine adjuvanted  0.5 mL Intramuscular Tomorrow-1000   insulin aspart  0-15 Units Subcutaneous TID AC & HS   mouth rinse  15 mL Mouth Rinse BID   metoprolol tartrate  100 mg Oral BID   pantoprazole  40 mg Oral BID   potassium chloride  40 mEq Oral Once   rosuvastatin  5 mg Oral Daily   sodium chloride flush  10-40 mL Intracatheter Q12H   sodium chloride flush  3 mL Intravenous Q12H   spironolactone  25 mg Oral Daily   torsemide  20 mg Oral Daily   Continuous Infusions:  sodium chloride 10 mL/hr at 06/21/21 1900   sodium chloride Stopped (06/22/21 0302)   promethazine (PHENERGAN) injection (IM or IVPB)     PRN Meds:.sodium  chloride, acetaminophen **OR** acetaminophen, guaiFENesin-dextromethorphan, potassium chloride, sodium chloride flush, sodium chloride flush   I have personally reviewed following labs and imaging studies  LABORATORY DATA: CBC: Recent Labs  Lab 06/19/21 1145 06/20/21 0045 06/21/21 1832 06/22/21 0016 06/23/21 0310 06/24/21 0358 06/25/21 0152  WBC 6.7   < > 8.1 8.8 10.4 11.1* 9.3  NEUTROABS 4.6  --   --   --   --   --   --   HGB 13.6   < > 12.1 11.6* 11.9* 11.8* 11.4*  HCT 42.2   < > 37.0 36.5 36.3 36.3 36.7  MCV 75.5*   < > 74.6* 75.6* 74.7* 74.8* 75.4*  PLT 189   < > 192 197 194 188 199   < > = values in this interval not displayed.     Basic Metabolic Panel: Recent Labs  Lab 06/19/21 1145 06/20/21 0045 06/21/21 2018 06/22/21 0302 06/23/21 0310 06/24/21 0358 06/25/21 0152  NA 139   < > 135 135 137 136 136  K 3.8   < > 3.2* 4.5 3.3* 4.3 4.5  CL 100   < > 101 101 100 101 103  CO2 25   < > GLUCOSE 149*   < > 150* 163* 130* 136* 106*  BUN 55*   < > 44* 43* 41* 37* 35*  CREATININE 1.45*   < > 1.32* 1.26* 1.21* 1.09* 0.88  CALCIUM 10.1   < > 8.7* 8.6* 8.7* 9.0 9.1  MG 2.2  --  1.9  --   --  2.0 2.1   < > = values in this interval not displayed.     GFR: Estimated Creatinine Clearance: 76.7 mL/min (by C-G formula based on SCr of 0.88 mg/dL).  Liver Function Tests: Recent Labs  Lab 06/19/21 1145  AST 31  ALT 36  ALKPHOS 89  BILITOT 1.8*  PROT 7.1  ALBUMIN 3.7    No results for input(s): LIPASE, AMYLASE in the last 168 hours. Recent Labs  Lab 06/19/21 1255  AMMONIA 27     Coagulation Profile: Recent Labs  Lab 06/21/21 1600 06/21/21 1832  INR 1.6* 1.7*     Cardiac Enzymes: No results for input(s): CKTOTAL, CKMB, CKMBINDEX, TROPONINI in the last 168 hours.  BNP (last 3 results) No results for input(s): PROBNP in the last 8760 hours.  Lipid Profile: No results for input(s): CHOL, HDL, LDLCALC, TRIG, CHOLHDL, LDLDIRECT in the  last 72 hours.   Thyroid Function Tests: No results for input(s): TSH, T4TOTAL, FREET4, T3FREE, THYROIDAB in the last 72 hours.  Anemia Panel: No results for input(s): VITAMINB12, FOLATE, FERRITIN, TIBC, IRON, RETICCTPCT in the last 72 hours.  Urine analysis:    Component Value Date/Time   COLORURINE YELLOW 06/19/2021 1520   APPEARANCEUR CLEAR 06/19/2021 1520   LABSPEC 1.021 06/19/2021 1520   PHURINE 5.0 06/19/2021 1520   GLUCOSEU NEGATIVE 06/19/2021 1520   HGBUR TRACE (A) 06/19/2021 1520   BILIRUBINUR NEGATIVE 06/19/2021 1520   KETONESUR NEGATIVE 06/19/2021 1520   PROTEINUR 30 (A) 06/19/2021 1520   NITRITE NEGATIVE 06/19/2021 1520   LEUKOCYTESUR SMALL (A) 06/19/2021 1520    Sepsis Labs: Lactic Acid, Venous    Component Value Date/Time   LATICACIDVEN 2.0 (HH) 06/21/2021 1832    MICROBIOLOGY: Recent Results (from the past 240 hour(s))  Resp Panel by RT-PCR (Flu A&B, Covid) Nasopharyngeal Swab     Status: None   Collection Time: 06/19/21 12:29 PM   Specimen: Nasopharyngeal Swab; Nasopharyngeal(NP) swabs in vial transport medium  Result Value Ref Range Status   SARS Coronavirus 2 by RT PCR NEGATIVE NEGATIVE Final    Comment: (NOTE) SARS-CoV-2 target nucleic acids are NOT DETECTED.  The SARS-CoV-2 RNA is generally detectable in upper respiratory specimens during the acute phase of infection. The lowest concentration of SARS-CoV-2 viral copies this assay can detect is 138 copies/mL. A negative result does not preclude SARS-Cov-2 infection and should not be used as the sole basis for treatment or other patient management decisions. A negative result may occur with  improper specimen collection/handling, submission of specimen other than nasopharyngeal swab, presence of viral mutation(s) within the areas targeted by this assay, and inadequate number of viral copies(<138 copies/mL). A negative result must be combined with clinical observations, patient history, and  epidemiological information. The expected result is Negative.  Fact Sheet for Patients:  BloggerCourse.com  Fact Sheet for Healthcare Providers:  SeriousBroker.it  This test is no t yet approved or cleared by the [place] FDA and  has been authorized for detection and/or diagnosis of SARS-CoV-2 by FDA under an Emergency Use Authorization (EUA). This EUA will remain  in effect (meaning this test can be used) for the duration of the COVID-19 declaration under Section 564(b)(1) of the Act, 21 U.S.C.section 360bbb-3(b)(1), unless the authorization is terminated  or revoked sooner.       Influenza A by PCR NEGATIVE NEGATIVE Final   Influenza B by PCR NEGATIVE NEGATIVE Final    Comment: (NOTE) The Xpert Xpress SARS-CoV-2/FLU/RSV plus assay is intended as an aid in the diagnosis of influenza from Nasopharyngeal swab specimens and should not be used as a sole basis for treatment. Nasal washings and aspirates are unacceptable for Xpert Xpress SARS-CoV-2/FLU/RSV testing.  Fact Sheet for Patients: BloggerCourse.com  Fact Sheet for Healthcare Providers: SeriousBroker.it  This test is not yet approved or cleared by the [place] FDA and has been authorized for detection and/or diagnosis of SARS-CoV-2 by FDA under an Emergency Use Authorization (EUA). This EUA will remain in effect (meaning this test can be used) for the duration of the COVID-19 declaration under Section 564(b)(1) of the Act, 21 U.S.C. section 360bbb-3(b)(1), unless the authorization is terminated or revoked.  Performed at [facility], [address] [place], [place] [address]   Urine Culture     Status: Abnormal   Collection Time: 06/20/21  1:37 AM   Specimen: Urine, Clean Catch  Result Value Ref Range Status   Specimen Description URINE, CLEAN CATCH  Final   Special Requests   Final     NONE Performed at [facility], [address] [place], [place] [address]    Culture MULTIPLE SPECIES PRESENT, SUGGEST RECOLLECTION (A)  Final   Report Status 06/21/2021 FINAL  Final  MRSA Next Gen by PCR, Nasal     Status: None   Collection Time: 06/21/21  2:54 AM   Specimen: Nasal Mucosa; Nasal Swab  Result Value Ref Range Status   MRSA by PCR Next Gen NOT DETECTED NOT DETECTED Final    Comment: (NOTE) The GeneXpert MRSA Assay (FDA approved for NASAL specimens only), is one component of a comprehensive MRSA colonization surveillance program. It is not intended to diagnose MRSA infection nor to guide or monitor treatment for MRSA infections. Test performance is not FDA approved in patients less than 70 years old. Performed at [facility], [address] [place], [place] [address]     RADIOLOGY STUDIES/RESULTS: VAS [place] LOWER EXTREMITY VENOUS (DVT)  Result Date: 06/23/2021  Lower Venous DVT Study Patient Name:  YANELLE SOUSA [facility]  Date of Exam:   06/23/2021 Medical Rec #: [id number]             Accession #:    [id number] Date of Birth: 1946/02/26             Patient Gender: F Patient Age:   1 years Exam Location:  [facility] Procedure:      VAS [place] LOWER EXTREMITY VENOUS (DVT) Referring Phys: Yates Decamp --------------------------------------------------------------------------------  Indications: Pulmonary embolism.  Anticoagulation: Heparin. Limitations: Poor ultrasound/tissue interface and body habitus. Comparison Study: No prior studies. Performing Technologist: Jean Rosenthal RDMS, RVT  Examination Guidelines: A complete evaluation includes B-mode imaging, spectral Doppler, color Doppler, and power Doppler as needed of all accessible portions of each vessel. Bilateral testing is considered an integral part of a complete examination. Limited examinations for reoccurring indications may be performed as noted. The reflux portion of the exam is performed with the patient in  reverse Trendelenburg.  +---------+---------------+---------+-----------+----------+-------------------+ RIGHT    CompressibilityPhasicitySpontaneityPropertiesThrombus Aging      +---------+---------------+---------+-----------+----------+-------------------+ CFV  Partial        Yes      Yes                  Acute               +---------+---------------+---------+-----------+----------+-------------------+ SFJ      Partial                                                          +---------+---------------+---------+-----------+----------+-------------------+ FV Prox                 Yes      Yes                                      +---------+---------------+---------+-----------+----------+-------------------+ FV Mid                  Yes      Yes                                      +---------+---------------+---------+-----------+----------+-------------------+ FV Distal               Yes      Yes                                      +---------+---------------+---------+-----------+----------+-------------------+ PFV                     Yes      Yes                                      +---------+---------------+---------+-----------+----------+-------------------+ POP                     Yes      Yes                                      +---------+---------------+---------+-----------+----------+-------------------+ PTV                                                   Not well visualized +---------+---------------+---------+-----------+----------+-------------------+ PERO                                                  Not well visualized +---------+---------------+---------+-----------+----------+-------------------+   +---------+---------------+---------+-----------+----------+--------------+ LEFT     CompressibilityPhasicitySpontaneityPropertiesThrombus Aging  +---------+---------------+---------+-----------+----------+--------------+ CFV      Full           Yes      Yes                                 +---------+---------------+---------+-----------+----------+--------------+  SFJ      Full                                                        +---------+---------------+---------+-----------+----------+--------------+ FV Prox  Full                                                        +---------+---------------+---------+-----------+----------+--------------+ FV Mid   Full                                                        +---------+---------------+---------+-----------+----------+--------------+ FV DistalFull                                                        +---------+---------------+---------+-----------+----------+--------------+ PFV      Full                                                        +---------+---------------+---------+-----------+----------+--------------+ POP      Full           Yes      Yes                                 +---------+---------------+---------+-----------+----------+--------------+ PTV      None                                                        +---------+---------------+---------+-----------+----------+--------------+ PERO     None                                                        +---------+---------------+---------+-----------+----------+--------------+     Summary: RIGHT: - Findings consistent with acute deep vein thrombosis involving the right common femoral vein. - No cystic structure found in the popliteal fossa.  LEFT: - Findings consistent with age indeterminate deep vein thrombosis involving the left posterior tibial veins, and left peroneal veins. - No cystic structure found in the popliteal fossa.  *See table(s) above for measurements and observations. Electronically signed by Coral Else MD on 06/23/2021 at 4:53:11 PM.    Final       LOS: 5 days   Jeoffrey Massed, MD  [facility]    To contact the attending provider between 7A-7P or the covering provider  during after hours 7P-7A, please log into the web site [email or web address] and access using universal [place] password for that web site. If you do not have the password, please call the hospital operator.  06/25/2021, 1:40 PM

## 2021-06-25 NOTE — Progress Notes (Signed)
Physical Therapy Treatment Patient Details Name: Ashley [name] MRN: [id number] DOB: 07-07-1946 Today's Date: 06/25/2021   History of Present Illness Pt is a 75 y.o. female admitted 06/19/21 with worsening heart failure symptoms; workup for HF with Afib. Pt for TEE cardioversion but BP dropped during procedure so aborted. Combination of TTE and TEE views do confirm no ventriclar or atrial clots; severe right ventricular systolic dysfunction and dilation noted.  CTA 10/3 showed submassive PE; given systemic tPA. ETT 10/3-10/4. PMH includes HTN, HLD, DM2, atrial flutter, CVA   PT Comments    Pt progressing with mobility. Today's session focused on transfer and gait training with RW. Pt remains limited by generalized weakness, decreased activity tolerance, and impaired balance strategies/postural reactions. Pt endorses plan to return home, reports she will have necessary assist from multiple family members. Recommend follow-up with HHPT services to maximize functional mobility and independence. Will continue to follow acutely to address established goals.  HR up to 146 with activity SpO2 >/94% on RA    Recommendations for follow up therapy are one component of a multi-disciplinary discharge planning process, led by the attending physician.  Recommendations may be updated based on patient status, additional functional criteria and insurance authorization.  Follow Up Recommendations  Home health PT;Supervision for mobility/OOB     Equipment Recommendations  None recommended by PT    Recommendations for Other Services       Precautions / Restrictions Precautions Precautions: Fall;Other (comment) Precaution Comments: Watch HR (tachy) Restrictions Weight Bearing Restrictions: No     Mobility  Bed Mobility Overal bed mobility: Modified Independent Bed Mobility: Supine to Sit           General bed mobility comments: HOB elevated, use of bed rail and momentum     Transfers Overall transfer level: Needs assistance Equipment used: 1 person hand held assist;Rolling walker (2 wheeled) Transfers: Sit to/from Stand Sit to Stand: Min assist         General transfer comment: Initial minA for HHA to pull to standing from low bed height, pivotals steps to recliner; additional stand from recliner, max verbal cues for sequencing and technique as pt almost scooting off edge of seat, min guard for trunk elevation to RW  Ambulation/Gait Ambulation/Gait assistance: Min guard Gait Distance (Feet): 40 Feet Assistive device: Rolling walker (2 wheeled) Gait Pattern/deviations: Step-through pattern;Decreased stride length;Wide base of support Gait velocity: Decreased   General Gait Details: Slow, mostly steady gait with RW and min guard for balance, further distance limited by fatigue and SOB, pt requiring seated rest; DOE 3/4 with HR up to 146   Stairs             Wheelchair Mobility    Modified Rankin (Stroke Patients Only)       Balance Overall balance assessment: Needs assistance Sitting-balance support: No upper extremity supported;Feet supported Sitting balance-Leahy Scale: Fair     Standing balance support: Bilateral upper extremity supported;No upper extremity supported Standing balance-Leahy Scale: Fair Standing balance comment: Can static stand without UE support; static and dynamic stability improved with RW                            Cognition Arousal/Alertness: Awake/alert Behavior During Therapy: WFL for tasks assessed/performed Overall Cognitive Status: No family/caregiver present to determine baseline cognitive functioning Area of Impairment: Following commands;Awareness;Problem solving;Safety/judgement  Following Commands: Follows one step commands with increased time Safety/Judgement: Decreased awareness of deficits Awareness: Emergent Problem Solving: Slow processing;Requires  verbal cues        Exercises      General Comments General comments (skin integrity, edema, etc.): Noted bilateral lower leg edema, pt reports compression socks ordered by RN this AM. Pt endorses plan to return home (instead of SNF); reports family available to assist as needed (son works during day, but nieces/nephews also help)      Pertinent Vitals/Pain Pain Assessment: Faces Faces Pain Scale: Hurts a little bit Pain Location: bilateral thighs Pain Descriptors / Indicators: Sore Pain Intervention(s): Monitored during session    Home Living                      Prior Function            PT Goals (current goals can now be found in the care plan section) Progress towards PT goals: Progressing toward goals    Frequency    Min 3X/week      PT Plan Discharge plan needs to be updated    Co-evaluation              AM-PAC PT "6 Clicks" Mobility   Outcome Measure  Help needed turning from your back to your side while in a flat bed without using bedrails?: A Little Help needed moving from lying on your back to sitting on the side of a flat bed without using bedrails?: A Little Help needed moving to and from a bed to a chair (including a wheelchair)?: A Little Help needed standing up from a chair using your arms (e.g., wheelchair or bedside chair)?: A Little Help needed to walk in hospital room?: A Little Help needed climbing 3-5 steps with a railing? : A Little 6 Click Score: 18    End of Session Equipment Utilized During Treatment: Gait belt Activity Tolerance: Patient tolerated treatment well;Patient limited by fatigue Patient left: in chair;with call bell/phone within reach;with chair alarm set Nurse Communication: Mobility status PT Visit Diagnosis: Other abnormalities of gait and mobility (R26.89);Muscle weakness (generalized) (M62.81)     Time: 0814-4818 PT Time Calculation (min) (ACUTE ONLY): 26 min  Charges:  $Gait Training: 8-22  mins $Therapeutic Activity: 8-22 mins                     Ina Homes, PT, DPT Acute Rehabilitation Services  Pager [phone] Office [phone]  Ashley [name] 06/25/2021, 10:52 AM

## 2021-06-25 NOTE — Progress Notes (Signed)
Subjective:  Patient feels much improved, was able to ambulate with the help of physical therapy.  Still has mild hemoptysis but has improved significantly.  No chest pain.  Dyspnea stable.  States that she was able to sleep better and leg edema is improved.  Intake/Output from previous day:  I/O last 3 completed shifts: In: 240 [P.O.:240] Out: 1600 [Urine:1600] No intake/output data recorded.  Blood pressure 116/89, pulse 90, temperature 97.7 F (36.5 C), temperature source Oral, resp. rate 19, height '5\' 7"'  (1.702 m), weight 124 kg, SpO2 100 %. Physical Exam Constitutional:      Comments: Morbidly obese in no acute distress.  HENT:     Head: Atraumatic.  Neck:     Vascular: JVD present. No carotid bruit.  Cardiovascular:     Rate and Rhythm: Tachycardia present. Rhythm irregular.     Heart sounds: Normal heart sounds. No murmur heard.   No gallop. No S3 or S4 sounds.     Comments: Unable to feel her femoral or popliteal pulse due to body habitus, pedal pulses could not be felt due to significant leg edema, soft blood pressure and tachycardia.  Doppler pulses heard at the pedal site. Pulmonary:     Effort: Pulmonary effort is normal.     Breath sounds: Normal breath sounds.  Abdominal:     General: Bowel sounds are normal.     Palpations: Abdomen is soft.     Comments: Obese. Pannus present  Musculoskeletal:        General: No swelling.     Right lower leg: Edema (2+ pitting) present.     Left lower leg: Edema (2+ pitting) present.  No change in physical exam from yesterday.  Heart rate has improved.  Lab Results: BMP BNP (last 3 results) Recent Labs    06/19/21 1145 06/25/21 0152  BNP 958.1* 726.8*     ProBNP (last 3 results) No results for input(s): PROBNP in the last 8760 hours. BMP Latest Ref Rng & Units 06/25/2021 06/24/2021 06/23/2021  Glucose 70 - 99 mg/dL 106(H) 136(H) 130(H)  BUN 8 - 23 mg/dL 35(H) 37(H) 41(H)  Creatinine 0.44 - 1.00 mg/dL 0.88 1.09(H)  1.21(H)  Sodium 135 - 145 mmol/L 136 136 137  Potassium 3.5 - 5.1 mmol/L 4.5 4.3 3.3(L)  Chloride 98 - 111 mmol/L 103 101 100  CO2 22 - 32 mmol/L '27 28 28  ' Calcium 8.9 - 10.3 mg/dL 9.1 9.0 8.7(L)   Hepatic Function Latest Ref Rng & Units 06/19/2021 04/06/2020 04/05/2020  Total Protein 6.5 - 8.1 g/dL 7.1 7.1 7.0  Albumin 3.5 - 5.0 g/dL 3.7 3.4(L) 3.5  AST 15 - 41 U/L 31 53(H) 64(H)  ALT 0 - 44 U/L 36 38 43  Alk Phosphatase 38 - 126 U/L 89 64 61  Total Bilirubin 0.3 - 1.2 mg/dL 1.8(H) 1.1 0.7   CBC Latest Ref Rng & Units 06/25/2021 06/24/2021 06/23/2021  WBC 4.0 - 10.5 K/uL 9.3 11.1(H) 10.4  Hemoglobin 12.0 - 15.0 g/dL 11.4(L) 11.8(L) 11.9(L)  Hematocrit 36.0 - 46.0 % 36.7 36.3 36.3  Platelets 150 - 400 K/uL 199 188 194   Lipid Panel     Component Value Date/Time   CHOL 163 04/05/2020 0334   TRIG 95 06/22/2021 0302   HDL 63 04/05/2020 0334   CHOLHDL 2.6 04/05/2020 0334   VLDL 13 04/05/2020 0334   LDLCALC 87 04/05/2020 0334   Cardiac Panel (last 3 results) No results for input(s): CKTOTAL, CKMB, TROPONINI, RELINDX in the last  72 hours.  HEMOGLOBIN A1C Lab Results  Component Value Date   HGBA1C 5.9 (H) 06/25/2021   MPG 122.63 06/25/2021   TSH Recent Labs    06/19/21 1229  TSH 1.412    Imaging: VAS [place] LOWER EXTREMITY VENOUS (DVT)  Result Date: 06/23/2021  Lower Venous DVT Study Patient Name:  EMMAMARIE KLUENDER [facility]  Date of Exam:   06/23/2021 Medical Rec #: [id number]             Accession #:    [id number] Date of Birth: 04-28-1946             Patient Gender: F Patient Age:   75 years Exam Location:  [facility] Procedure:      VAS [place] LOWER EXTREMITY VENOUS (DVT) Referring Phys: Adrian Prows --------------------------------------------------------------------------------  Indications: Pulmonary embolism.  Anticoagulation: Heparin. Limitations: Poor ultrasound/tissue interface and body habitus. Comparison Study: No prior studies. Performing Technologist: Darlin Coco RDMS, RVT   Examination Guidelines: A complete evaluation includes B-mode imaging, spectral Doppler, color Doppler, and power Doppler as needed of all accessible portions of each vessel. Bilateral testing is considered an integral part of a complete examination. Limited examinations for reoccurring indications may be performed as noted. The reflux portion of the exam is performed with the patient in reverse Trendelenburg.  +---------+---------------+---------+-----------+----------+-------------------+ RIGHT    CompressibilityPhasicitySpontaneityPropertiesThrombus Aging      +---------+---------------+---------+-----------+----------+-------------------+ CFV      Partial        Yes      Yes                  Acute               +---------+---------------+---------+-----------+----------+-------------------+ SFJ      Partial                                                          +---------+---------------+---------+-----------+----------+-------------------+ FV Prox                 Yes      Yes                                      +---------+---------------+---------+-----------+----------+-------------------+ FV Mid                  Yes      Yes                                      +---------+---------------+---------+-----------+----------+-------------------+ FV Distal               Yes      Yes                                      +---------+---------------+---------+-----------+----------+-------------------+ PFV                     Yes      Yes                                      +---------+---------------+---------+-----------+----------+-------------------+  POP                     Yes      Yes                                      +---------+---------------+---------+-----------+----------+-------------------+ PTV                                                   Not well visualized  +---------+---------------+---------+-----------+----------+-------------------+ PERO                                                  Not well visualized +---------+---------------+---------+-----------+----------+-------------------+   +---------+---------------+---------+-----------+----------+--------------+ LEFT     CompressibilityPhasicitySpontaneityPropertiesThrombus Aging +---------+---------------+---------+-----------+----------+--------------+ CFV      Full           Yes      Yes                                 +---------+---------------+---------+-----------+----------+--------------+ SFJ      Full                                                        +---------+---------------+---------+-----------+----------+--------------+ FV Prox  Full                                                        +---------+---------------+---------+-----------+----------+--------------+ FV Mid   Full                                                        +---------+---------------+---------+-----------+----------+--------------+ FV DistalFull                                                        +---------+---------------+---------+-----------+----------+--------------+ PFV      Full                                                        +---------+---------------+---------+-----------+----------+--------------+ POP      Full           Yes      Yes                                 +---------+---------------+---------+-----------+----------+--------------+  PTV      None                                                        +---------+---------------+---------+-----------+----------+--------------+ PERO     None                                                        +---------+---------------+---------+-----------+----------+--------------+     Summary: RIGHT: - Findings consistent with acute deep vein thrombosis involving the right common femoral  vein. - No cystic structure found in the popliteal fossa.  LEFT: - Findings consistent with age indeterminate deep vein thrombosis involving the left posterior tibial veins, and left peroneal veins. - No cystic structure found in the popliteal fossa.  *See table(s) above for measurements and observations. Electronically signed by Harold Barban MD on 06/23/2021 at 4:53:11 PM.    Final     Cardiac Studies:  EKG 07/08/2021: Atrial fibrillation with rapid ventricular response at rate of 175 bpm, anteroseptal infarct old, diffuse nonspecific ST-T abnormality.  Echocardiogram 04/05/2020:  1. Left ventricular ejection fraction, by estimation, is 55 to 60%. The  left ventricle has normal function. The left ventricle has no regional  wall motion abnormalities. There is mild left ventricular hypertrophy.  Left ventricular diastolic parameters  are indeterminate.   2. Right ventricular systolic function is normal. The right ventricular  size is normal.   3. Left atrial size was moderately dilated.   4. Nosignificant valvular abnormality.   5. Agitated saline contrast bubble study was negative, with no evidence  of any interatrial shunt.   TEE 06/22/2021: 1. Left ventricular ejection fraction, by estimation, is 40 to 45%. The left ventricle has mildly decreased function. LVEF is likely underestimated due to interentricular septal flatterning in systole and diastole s/o RV pressure and volume overload.  There is moderate left ventricular hypertrophy. Left ventricular diastolic function could not be evaluated. There is the interventricular septum is flattened in systole and diastole, consistent with right ventricular pressure and volume overload.  2. Right ventricular systolic function is moderately reduced. The right ventricular size is mildly enlarged. There is mildly elevated pulmonary artery systolic pressure.  3. Left atrial size was severely dilated.  4. Right atrial size was severely dilated.  5. A  small pericardial effusion is present. There is no evidence of cardiac tamponade.  6. The mitral valve is grossly normal. Moderate to severe mitral valve regurgitation.  7. Tricuspid valve regurgitation is severe. The tricuspid  regurgitant velocity is 2.63 m/s, and with an assumed right atrial pressure of 15 mmHg, the estimated right ventricular systolic pressure is 82.5 mmHg.  8. The aortic valve is tricuspid. Aortic valve regurgitation is not visualized.  9. Above abnormalities are new compared to previous TTE in 2021.    Scheduled Meds: . amiodarone  200 mg Oral BID  . apixaban  10 mg Oral BID   Followed by  . [START ON 06/30/2021] apixaban  5 mg Oral BID  . Chlorhexidine Gluconate Cloth  6 each Topical Daily  . cholecalciferol  5,000 Units Oral Daily  . digoxin  0.25 mg Oral Daily  . influenza vaccine adjuvanted  0.5 mL Intramuscular Tomorrow-1000  . insulin aspart  0-15 Units Subcutaneous TID AC & HS  . mouth rinse  15 mL Mouth Rinse BID  . metoprolol tartrate  50 mg Oral TID  . pantoprazole  40 mg Oral BID  . potassium chloride  40 mEq Oral Once  . rosuvastatin  5 mg Oral Daily  . sodium chloride flush  10-40 mL Intracatheter Q12H  . sodium chloride flush  3 mL Intravenous Q12H  . spironolactone  25 mg Oral Daily  . torsemide  20 mg Oral Daily   Continuous Infusions: . sodium chloride 10 mL/hr at 06/21/21 1900  . sodium chloride Stopped (06/22/21 0302)  . promethazine (PHENERGAN) injection (IM or IVPB)     PRN Meds:.sodium chloride, acetaminophen **OR** acetaminophen, guaiFENesin-dextromethorphan, potassium chloride, sodium chloride flush, sodium chloride flush  Assessment/Plan:  1.  Massive PE with cardiogenic syncope:  CT Chest 06/21/2021: Substantial pulmonary embolus in the right lung with lobar involvement of the upper lobe, lower lobe, and middle lobe pulmonary arteries.  2.  A. fib with RVR 3.  Acute renal failure secondary to cardiogenic shock and ATN now  resolved 4.  Acute on chronic diastolic heart failure  Recommendation:  Patient is presently doing well, ambulated with the help of physical therapy and appears to be very stable for potential discharge soon.  She will need home health and home physical therapy.  With regard to A. fib with RVR, I will increase metoprolol dose to 100 mg p.o. twice daily, continue amiodarone and digoxin for now.  Will consider direct-current cardioversion probably in 3 to 4 weeks from now in the hopes of maintaining sinus rhythm.  Heart rate has significantly improved but did go up to 140 bpm with physical therapy.  Blood pressure is stable.  She is tolerating spironolactone, will discontinue IV Lasix and switch over to torsemide 20 mg daily.  Renal failure has resolved.  Still in fluid overload state, with elevated BNP which is trending down.  Will discharge home on the present medications and we will follow-up in the office in 1 week to 10 days post discharge for a TOC visit.    Adrian Prows, MD, [facility] 06/25/2021, 11:14 AM Office: [phone] Fax: [phone] Pager: [phone]

## 2021-06-25 NOTE — Progress Notes (Signed)
Occupational Therapy Treatment Patient Details Name: Ashley [name] MRN: [id number] DOB: 1945/10/05 Today's Date: 06/25/2021   History of present illness Pt is a 75 y.o. female admitted 06/19/21 with worsening heart failure symptoms; workup for HF with Afib. Pt for TEE cardioversion but BP dropped during procedure so aborted. Combination of TTE and TEE views do confirm no ventriclar or atrial clots; severe right ventricular systolic dysfunction and dilation noted.  CTA 10/3 showed submassive PE; given systemic tPA. ETT 10/3-10/4. PMH includes HTN, HLD, DM2, atrial flutter, CVA   OT comments  Patient up in recliner and agreeable to OT treatment. Patient asked to address self care tasks and she stated she had completed earlier with nursing. Patient asked to attempt to donn non-skid socks and she states she is uncomfortable attempting at this time. Transfers training performed with chair to chair transfers to simulate toilet transfers with min assist to stand and min guard assist to supervision when up. Patient completed session in bed with min assist for sit to supine due to difficulty getting BLEs into bed.  Acute OT to continue to follow.    Recommendations for follow up therapy are one component of a multi-disciplinary discharge planning process, led by the attending physician.  Recommendations may be updated based on patient status, additional functional criteria and insurance authorization.    Follow Up Recommendations  SNF;Supervision/Assistance - 24 hour    Equipment Recommendations  3 in 1 bedside commode    Recommendations for Other Services      Precautions / Restrictions Precautions Precautions: Fall;Other (comment) Precaution Comments: Watch HR (tachy) Restrictions Weight Bearing Restrictions: No       Mobility Bed Mobility Overal bed mobility: Needs Assistance Bed Mobility: Sit to Supine       Sit to supine: Min assist   General bed mobility comments: required  assistance with getting BLE into bed    Transfers Overall transfer level: Needs assistance Equipment used: Rolling walker (2 wheeled) Transfers: Sit to/from Stand Sit to Stand: Min assist Stand pivot transfers: Min assist       General transfer comment: required verbal cues for hand placement    Balance Overall balance assessment: Needs assistance Sitting-balance support: No upper extremity supported;Feet supported Sitting balance-Leahy Scale: Fair     Standing balance support: Bilateral upper extremity supported Standing balance-Leahy Scale: Fair Standing balance comment: performed static standing with BUE support                           ADL either performed or assessed with clinical judgement   ADL                                               Vision       Perception     Praxis      Cognition Arousal/Alertness: Awake/alert Behavior During Therapy: WFL for tasks assessed/performed Overall Cognitive Status: No family/caregiver present to determine baseline cognitive functioning Area of Impairment: Following commands;Awareness;Problem solving;Safety/judgement                       Following Commands: Follows one step commands with increased time Safety/Judgement: Decreased awareness of deficits Awareness: Emergent Problem Solving: Slow processing;Requires verbal cues General Comments: cues for hand placement for sit to stands        Exercises  Shoulder Instructions       General Comments Noted bilateral lower leg edema, pt reports compression socks ordered by RN this AM. Pt endorses plan to return home (instead of SNF); reports family available to assist as needed (son works during day, but nieces/nephews also help)    Pertinent Vitals/ Pain       Pain Assessment: Faces Faces Pain Scale: Hurts a little bit Pain Location: bilateral thighs Pain Descriptors / Indicators: Sore Pain Intervention(s): Monitored  during session  Home Living                                          Prior Functioning/Environment              Frequency  Min 2X/week        Progress Toward Goals  OT Goals(current goals can now be found in the care plan section)  Progress towards OT goals: Progressing toward goals  Acute Rehab OT Goals Patient Stated Goal: to return home OT Goal Formulation: With patient Time For Goal Achievement: 07/07/21 Potential to Achieve Goals: Good ADL Goals Pt Will Perform Grooming: with supervision;standing Pt Will Perform Lower Body Bathing: with supervision;sit to/from stand Pt Will Perform Lower Body Dressing: with supervision;sit to/from stand Pt Will Transfer to Toilet: with supervision;ambulating Pt Will Perform Toileting - Clothing Manipulation and hygiene: with supervision;sit to/from stand Additional ADL Goal #1: Pt will state at least 3 energy conservation strategies as instructed.  Plan Discharge plan remains appropriate    Co-evaluation                 AM-PAC OT "6 Clicks" Daily Activity     Outcome Measure   Help from another person eating meals?: None Help from another person taking care of personal grooming?: A Little Help from another person toileting, which includes using toliet, bedpan, or urinal?: A Lot Help from another person bathing (including washing, rinsing, drying)?: A Lot Help from another person to put on and taking off regular upper body clothing?: A Little Help from another person to put on and taking off regular lower body clothing?: A Lot 6 Click Score: 16    End of Session Equipment Utilized During Treatment: Rolling walker  OT Visit Diagnosis: Unsteadiness on feet (R26.81);Other abnormalities of gait and mobility (R26.89);Muscle weakness (generalized) (M62.81)   Activity Tolerance Patient tolerated treatment well   Patient Left in bed;with call bell/phone within reach;with bed alarm set   Nurse  Communication Other (comment) (notified nursing of IV site bleeding on LUE)        Time: 5916-3846 OT Time Calculation (min): 36 min  Charges: OT General Charges $OT Visit: 1 Visit OT Treatments $Therapeutic Activity: 23-37 mins  Alfonse Flavors, OTA   Rayanna Matusik Jeannett Senior 06/25/2021, 1:56 PM

## 2021-06-26 LAB — CBC
HCT: 36 % (ref 36.0–46.0)
Hemoglobin: 11.5 g/dL — ABNORMAL LOW (ref 12.0–15.0)
MCH: 24 pg — ABNORMAL LOW (ref 26.0–34.0)
MCHC: 31.9 g/dL (ref 30.0–36.0)
MCV: 75 fL — ABNORMAL LOW (ref 80.0–100.0)
Platelets: 215 10*3/uL (ref 150–400)
RBC: 4.8 MIL/uL (ref 3.87–5.11)
RDW: 17.2 % — ABNORMAL HIGH (ref 11.5–15.5)
WBC: 8.7 10*3/uL (ref 4.0–10.5)
nRBC: 0.2 % (ref 0.0–0.2)

## 2021-06-26 LAB — GLUCOSE, CAPILLARY
Glucose-Capillary: 101 mg/dL — ABNORMAL HIGH (ref 70–99)
Glucose-Capillary: 120 mg/dL — ABNORMAL HIGH (ref 70–99)
Glucose-Capillary: 98 mg/dL (ref 70–99)

## 2021-06-26 LAB — BRAIN NATRIURETIC PEPTIDE: B Natriuretic Peptide: 1617.9 pg/mL — ABNORMAL HIGH (ref 0.0–100.0)

## 2021-06-26 LAB — MAGNESIUM: Magnesium: 2 mg/dL (ref 1.7–2.4)

## 2021-06-26 MED ORDER — INFLUENZA VAC A&B SA ADJ QUAD 0.5 ML IM PRSY: 0.5000 mL | PREFILLED_SYRINGE | INTRAMUSCULAR | Status: DC

## 2021-06-26 NOTE — Progress Notes (Signed)
PROGRESS NOTE        PATIENT DETAILS Name: Ashley [name] Age: 75 y.o. Sex: female Date of Birth: 1946/05/24 Admit Date: 06/19/2021 Admitting Physician Dorcas Carrow, MD LKT:GYBWL, Zollie Beckers, MD  Brief Narrative: Patient is a 75 y.o. female with history of DM-2, HTN, HLD,CVA, atrial tachycardia-admitted with A. fib with RVR-subsequently evaluated by cardiology-underwent TEE  which was complicated by severe hypoxia/hypotension-intermittent PEA-requiring emergent intubation and transfer to the ICU.  TEE was limited but it did show severe RV dysfunction-subsequent CTA chest showed submassive PE-she was subsequently given systemic tPA.    Upon stability-patient was transferred to the [facility] service on 10/6.  Major events: 10/1>> admit for A. fib RVR 10/3>> while at TEE-severely hypotensive/hypoxic-intermittent PEA-intubated-transferred to ICU 10/3>> CT angio showed submassive PE-received systemic tPA 10/4>> extubated 10/6>> transfer to [facility]   Pertinent Labs/Radiology: WBC: 9.3 Hb: 11.4 PLT: 199 Na: 136 K: 4.5 Creatinine: 0.88  10/1>> TSH: 1.4  10/2>>Urine Culture: Multiple species  10/3>> CT angio chest: Submassive PE, moderate right pleural effusion 10/3>> Limited TEE: EF 30-35%, RV systolic function severely reduced. 10/4>> TTE: EF 40-45%, RV systolic function moderately reduced, small pericardial effusion. 10/5>> bilateral lower extremity Doppler: Acute DVT right common femoral vein, age-indeterminate DVT involving left posterior tibial/left peroneal vein.   Subjective: Patient in bed, appears comfortable, denies any headache, no fever, no chest pain or pressure, no shortness of breath , no abdominal pain. No new focal weakness.   Objective: Vitals: Blood pressure 102/78, pulse 69, temperature 98.4 F (36.9 C), temperature source Oral, resp. rate 15, height 5\' 7"  (1.702 m), weight 124 kg, SpO2 93 %.   Exam:  Awake Alert, No new F.N deficits,  Normal affect [place].AT,PERRAL Supple Neck,No JVD, No cervical lymphadenopathy appriciated.  Symmetrical Chest wall movement, Good air movement bilaterally, CTAB iRRR,No Gallops, Rubs or new Murmurs, No Parasternal Heave +ve B.Sounds, Abd Soft, No tenderness, No organomegaly appriciated, No rebound - guarding or rigidity. No Cyanosis, Clubbing or edema, No new Rash or bruise    Assessment/Plan: A. fib with RVR: Overall much improved-rate controlled at rest-but does creep up to the 140s-150s with ambulation (deconditioned as well).  Cardiology following-remains on amiodarone/digoxin and metoprolol.  Anticoagulated with Eliquis.  Discussed with Dr. unexpected to have tachycardia with ambulation given acute illness/deconditioning-plan is to continue to observe another 1-2 days before consideration of discharge.   HFrEF with exacerbation EF 45% : Volume status gradually improving-she continues to have lower extremity edema-cardiology has switched her to oral Demadex-she remains on spironolactone.  Continue to follow closely.  Suspect some amount of lower extremity edema is from bilateral DVTs as well.   Acute hypoxic respiratory failure due to submassive PE: Intubated on 10/3-extubated on 10/4-she was on 2 L of oxygen this morning-have asked RN to titrate her FiO2 down as tolerated.    Bilateral lower extremity DVT along with submassive PE with RV strain/acute cor pulmonale and PEA arrest: S/p tPA on 10/3-currently on Eliquis.  Given significant clot burden-suspect may require indefinite anticoagulation.  HLD: Continue statin  History of cryptogenic CVA  Debility/deconditioning: Due to acute illness-being evaluated by PT/OT with recommendations for home health on discharge.  Other issues:  Foley catheter removed on 10/6-voiding spontaneously.  Morbid Obesity: BMI 42, follow with PCP.   DM-2: CBG stable-continue SSI  CBG (last 3)  Recent Labs  06/25/21 2058 06/26/21 0624  06/26/21 1124  GLUCAP 147* 101* 120*     Consults: PCCM, cardiology DVT Prophylaxis: Eliquis Code Status:Full code Family Communication: None at bedside  Time spent: 35 minutes-Greater than 50% of this time was spent in counseling, explanation of diagnosis, planning of further management, and coordination of care.  Diet: Diet Order             DIET DYS 3 Room service appropriate? Yes with Assist; Fluid consistency: Thin  Diet effective now                      Disposition Plan: Status is: Inpatient  Remains inpatient appropriate because:Inpatient level of care appropriate due to severity of illness  Dispo: The patient is from: Home              Anticipated d/c is to: SNF              Patient currently is not medically stable to d/c.   Difficult to place patient No     Barriers to Discharge: A. fib RVR-rate not adequately controlled-decompensated heart failure still with volume overload-not yet stable for discharge.  Antimicrobial agents: Anti-infectives (From admission, onward)    None        MEDICATIONS: Scheduled Meds:  amiodarone  200 mg Oral BID   apixaban  10 mg Oral BID   Followed by   Melene Muller ON 06/30/2021] apixaban  5 mg Oral BID   Chlorhexidine Gluconate Cloth  6 each Topical Daily   cholecalciferol  5,000 Units Oral Daily   digoxin  0.25 mg Oral Daily   [START ON 06/28/2021] influenza vaccine adjuvanted  0.5 mL Intramuscular Tomorrow-1000   insulin aspart  0-15 Units Subcutaneous TID AC & HS   mouth rinse  15 mL Mouth Rinse BID   metoprolol tartrate  100 mg Oral BID   pantoprazole  40 mg Oral BID   rosuvastatin  5 mg Oral Daily   spironolactone  25 mg Oral Daily   torsemide  20 mg Oral Daily   Continuous Infusions:  sodium chloride Stopped (06/22/21 0302)   PRN Meds:.acetaminophen **OR** acetaminophen, guaiFENesin-dextromethorphan, potassium chloride   I have personally reviewed following labs and imaging studies  LABORATORY  DATA:  Recent Labs  Lab 06/19/21 1145 06/20/21 0045 06/22/21 0016 06/23/21 0310 06/24/21 0358 06/25/21 0152 06/26/21 0500  WBC 6.7   < > 8.8 10.4 11.1* 9.3 8.7  HGB 13.6   < > 11.6* 11.9* 11.8* 11.4* 11.5*  HCT 42.2   < > 36.5 36.3 36.3 36.7 36.0  PLT 189   < > 197 194 188 199 215  MCV 75.5*   < > 75.6* 74.7* 74.8* 75.4* 75.0*  MCH 24.3*   < > 24.0* 24.5* 24.3* 23.4* 24.0*  MCHC 32.2   < > 31.8 32.8 32.5 31.1 31.9  RDW 18.7*   < > 17.7* 17.7* 17.9* 17.5* 17.2*  LYMPHSABS 1.5  --   --   --   --   --   --   MONOABS 0.6  --   --   --   --   --   --   EOSABS 0.0  --   --   --   --   --   --   BASOSABS 0.0  --   --   --   --   --   --    < > = values in this interval not displayed.  Recent Labs  Lab 06/19/21 1145 06/19/21 1229 06/19/21 1255 06/20/21 0045 06/21/21 1600 06/21/21 1630 06/21/21 1830 06/21/21 1832 06/21/21 2018 06/22/21 0302 06/23/21 0310 06/24/21 0358 06/25/21 0152 06/26/21 0500  NA 139  --   --    < >  --   --    < >  --  135 135 137 136 136  --   K 3.8  --   --    < >  --   --    < >  --  3.2* 4.5 3.3* 4.3 4.5  --   CL 100  --   --    < >  --   --   --   --  101 101 100 101 103  --   CO2 25  --   --    < >  --   --   --   --  22 25 28 28 27   --   GLUCOSE 149*  --   --    < >  --   --   --   --  150* 163* 130* 136* 106*  --   BUN 55*  --   --    < >  --   --   --   --  44* 43* 41* 37* 35*  --   CREATININE 1.45*  --   --    < >  --   --   --   --  1.32* 1.26* 1.21* 1.09* 0.88  --   CALCIUM 10.1  --   --    < >  --   --   --   --  8.7* 8.6* 8.7* 9.0 9.1  --   AST 31  --   --   --   --   --   --   --   --   --   --   --   --   --   ALT 36  --   --   --   --   --   --   --   --   --   --   --   --   --   ALKPHOS 89  --   --   --   --   --   --   --   --   --   --   --   --   --   BILITOT 1.8*  --   --   --   --   --   --   --   --   --   --   --   --   --   ALBUMIN 3.7  --   --   --   --   --   --   --   --   --   --   --   --   --   MG 2.2  --   --   --    --   --   --   --  1.9  --   --  2.0 2.1 2.0  LATICACIDVEN  --   --   --   --   --  2.1*  --  2.0*  --   --   --   --   --   --   INR  --   --   --   --  1.6*  --   --  1.7*  --   --   --   --   --   --   TSH  --  1.412  --   --   --   --   --   --   --   --   --   --   --   --   HGBA1C  --   --   --   --   --   --   --   --   --   --   --   --  5.9*  --   AMMONIA  --   --  27  --   --   --   --   --   --   --   --   --   --   --   BNP 958.1*  --   --   --   --   --   --   --   --   --   --   --  726.8* 1,617.9*   < > = values in this interval not displayed.          RADIOLOGY STUDIES/RESULTS: No results found.   LOS: 6 days   Signature  Susa Raring M.D on 06/26/2021 at 11:34 AM   -  To page go to [email or web address]

## 2021-06-26 NOTE — Progress Notes (Addendum)
Mobility Specialist: Progress Note   06/26/21 1823  Mobility  Activity Ambulated in hall  Level of Assistance Minimal assist, patient does 75% or more  Assistive Device Front wheel walker  Distance Ambulated (ft) 130 ft  Mobility Ambulated with assistance in hallway  Mobility Response Tolerated well  Mobility performed by Mobility specialist  $Mobility charge 1 Mobility   Pre-Mobility: 92 HR, 94% SpO2 During Mobility: 93%SpO2 Post-Mobility: 96% SpO2  Pt required minA to stand from chair and contact guard during ambulation, asx throughout. Pt to bed after walk per request with call bell and phone in reach.   SATURATION QUALIFICATIONS: (This note is used to comply with regulatory documentation for home oxygen)  Patient Saturations on Room Air at Rest = 94%  Patient Saturations on Room Air while Ambulating = 93%  Patient Saturations on 0 Liters of oxygen while Ambulating = 96%  Production designer, theatre/television/film Phone: [phone]

## 2021-06-27 LAB — MAGNESIUM: Magnesium: 1.9 mg/dL (ref 1.7–2.4)

## 2021-06-27 LAB — COMPREHENSIVE METABOLIC PANEL
ALT: 23 U/L (ref 0–44)
AST: 27 U/L (ref 15–41)
Albumin: 2.2 g/dL — ABNORMAL LOW (ref 3.5–5.0)
Alkaline Phosphatase: 86 U/L (ref 38–126)
Anion gap: 6 (ref 5–15)
BUN: 26 mg/dL — ABNORMAL HIGH (ref 8–23)
CO2: 28 mmol/L (ref 22–32)
Calcium: 9.1 mg/dL (ref 8.9–10.3)
Chloride: 100 mmol/L (ref 98–111)
Creatinine, Ser: 0.95 mg/dL (ref 0.44–1.00)
GFR, Estimated: >60 mL/min (ref >60)
Glucose, Bld: 117 mg/dL — ABNORMAL HIGH (ref 70–99)
Potassium: 4.2 mmol/L (ref 3.5–5.1)
Sodium: 134 mmol/L — ABNORMAL LOW (ref 135–145)
Total Bilirubin: 1.6 mg/dL — ABNORMAL HIGH (ref 0.3–1.2)
Total Protein: 6.1 g/dL — ABNORMAL LOW (ref 6.5–8.1)

## 2021-06-27 LAB — CBC
HCT: 37.6 % (ref 36.0–46.0)
Hemoglobin: 11.9 g/dL — ABNORMAL LOW (ref 12.0–15.0)
MCH: 23.7 pg — ABNORMAL LOW (ref 26.0–34.0)
MCHC: 31.6 g/dL (ref 30.0–36.0)
MCV: 74.9 fL — ABNORMAL LOW (ref 80.0–100.0)
Platelets: 241 10*3/uL (ref 150–400)
RBC: 5.02 MIL/uL (ref 3.87–5.11)
RDW: 17.2 % — ABNORMAL HIGH (ref 11.5–15.5)
WBC: 8.3 10*3/uL (ref 4.0–10.5)
nRBC: 0.2 % (ref 0.0–0.2)

## 2021-06-27 LAB — DIGOXIN LEVEL: Digoxin Level: 1.1 ng/mL (ref 0.8–2.0)

## 2021-06-27 LAB — BRAIN NATRIURETIC PEPTIDE: B Natriuretic Peptide: 1914.8 pg/mL — ABNORMAL HIGH (ref 0.0–100.0)

## 2021-06-27 LAB — GLUCOSE, CAPILLARY
Glucose-Capillary: 95 mg/dL (ref 70–99)
Glucose-Capillary: 131 mg/dL — ABNORMAL HIGH (ref 70–99)

## 2021-06-27 MED ORDER — APIXABAN 5 MG PO TABS: 10.0000 mg | ORAL_TABLET | Freq: Two times a day (BID) | ORAL | 0 refills | Status: DC

## 2021-06-27 MED ORDER — SPIRONOLACTONE 25 MG PO TABS: 25.0000 mg | ORAL_TABLET | Freq: Every day | ORAL | 0 refills | Status: DC

## 2021-06-27 MED ORDER — FAMOTIDINE 20 MG PO TABS: 20.0000 mg | ORAL_TABLET | Freq: Every day | ORAL | 0 refills | Status: DC

## 2021-06-27 MED ORDER — TORSEMIDE 20 MG PO TABS: 20.0000 mg | ORAL_TABLET | Freq: Every day | ORAL | Status: DC

## 2021-06-27 MED ORDER — FUROSEMIDE 10 MG/ML IJ SOLN
60.0000 mg | Freq: Once | INTRAMUSCULAR | Status: AC
  Administered 2021-06-27: 60 mg via INTRAVENOUS
  Filled 2021-06-27: qty 6

## 2021-06-27 MED ORDER — DIGOXIN 250 MCG PO TABS: 0.2500 mg | ORAL_TABLET | Freq: Every day | ORAL | 0 refills | Status: DC

## 2021-06-27 MED ORDER — TORSEMIDE 20 MG PO TABS: 20.0000 mg | ORAL_TABLET | Freq: Every day | ORAL | 0 refills | Status: DC

## 2021-06-27 MED ORDER — LOSARTAN POTASSIUM 25 MG PO TABS: 12.5000 mg | ORAL_TABLET | Freq: Every day | ORAL | 0 refills | Status: DC

## 2021-06-27 MED ORDER — METOPROLOL TARTRATE 100 MG PO TABS: 100.0000 mg | ORAL_TABLET | Freq: Two times a day (BID) | ORAL | 0 refills | Status: DC

## 2021-06-27 MED ORDER — AMIODARONE HCL 200 MG PO TABS: ORAL_TABLET | ORAL | 0 refills | Status: DC

## 2021-06-27 MED ORDER — APIXABAN 5 MG PO TABS: 5.0000 mg | ORAL_TABLET | Freq: Two times a day (BID) | ORAL | 0 refills | Status: DC

## 2021-06-27 NOTE — TOC Progression Note (Addendum)
Transition of Care [facility]) - Progression Note    Patient Details  Name: Ashley [name] MRN: [id number] Date of Birth: Oct 24, 1945  [facility] CM/SW Contact  Kallie Locks, RN Phone Number: [phone] 06/27/2021, 9:07 AM  Clinical Narrative:   Spoke with patient who states she will have assistance at home. States her niece will stay with her. States several of her family members are hospital employees. States she has family member who is a Engineer, civil (consulting). Discussed recommendations for [facility] PT/RN. Denies needing HHC RN. Reluctantly agrees to [facility] PT services. No preference. Writer to follow up with home health agencies to see who can accept.   Patient also denies needing 3 in 1 bedside commode or any other DME.   Spoke with Angie with [facility] to see if she can accept referral. Will await for Angie's return call on whether she can accept referral or not.   Addendum: Angie with [facility] called back to make writer aware that she will be able to accept referral for [facility] PT. Sent message to nursing and MD to make aware patient declined [facility] RN.     Expected Discharge Plan: Home w Home Health Services (possily home or with home health services) Barriers to Discharge: No Barriers Identified  Expected Discharge Plan and Services Expected Discharge Plan: Home w Home Health Services (possily home or with home health services) In-house Referral: Clinical Social Work     Living arrangements for the past 2 months: Single Family Home Expected Discharge Date: 06/27/21               DME Arranged:  (declined DME)         HH Arranged: PT (declined RN) HH Agency: [facility] Date HH Agency Contacted: 06/27/21 Time HH Agency Contacted: [phone] Representative spoke with at [facility] Agency: Angie will call her main office to see if they can accept [facility]   Social Determinants of Health (SDOH) Interventions    Readmission Risk Interventions No flowsheet data found.

## 2021-06-27 NOTE — Discharge Summary (Signed)
[address] [place] ZOX:[id number] DOB: 1945-10-04 DOA: 06/19/2021  PCP: Merri Brunette, MD  Admit date: 06/19/2021  Discharge date: 06/27/2021  Admitted From: Home Disposition:  Home   Recommendations for Outpatient Follow-up:   Follow up with PCP in 1-2 weeks  PCP Please obtain BMP/CBC, 2 view CXR in 1week,  (see Discharge instructions)   PCP Please follow up on the following pending results: Monitor blood pressure, check CBC, CMP chest x-ray in 5 to 7 days.   Home Health: Patient refused Equipment/Devices: Patient refused  Consultations: PCCM, Cards Discharge Condition: Stable    CODE STATUS: Full    Diet Recommendation: Soft Heart Healthy - with 1.5 striction.  Chief Complaint  Patient presents with   Shortness of Breath     Brief history of present illness from the day of admission and additional interim summary    Patient is a 75 y.o. female with history of DM-2, HTN, HLD,CVA, atrial tachycardia-admitted with A. fib with RVR-subsequently evaluated by cardiology-underwent TEE  which was complicated by severe hypoxia/hypotension-intermittent PEA-requiring emergent intubation and transfer to the ICU.  TEE was limited but it did show severe RV dysfunction-subsequent CTA chest showed submassive PE-she was subsequently given systemic tPA.    Upon stability-patient was transferred to the [facility] service on 10/6.   Major events: 10/1>> admit for A. fib RVR 10/3>> while at TEE-severely hypotensive/hypoxic-intermittent PEA-intubated-transferred to ICU 10/3>> CT angio showed submassive PE-received systemic tPA 10/4>> extubated 10/6>> transfer to [facility]     Pertinent Labs/Radiology:  10/3>> CT angio chest: Submassive PE, moderate right pleural effusion 10/3>> Limited TEE: EF 30-35%, RV systolic function severely  reduced. 10/4>> TTE: EF 40-45%, RV systolic function moderately reduced, small pericardial effusion. 10/5>> bilateral lower extremity Doppler: Acute DVT right common femoral vein, age-indeterminate DVT involving left posterior tibial/left peroneal vein.                                                                 Hospital Course    A. fib with RVR: Overall much improved-rate controlled at rest-but does creep up to the 140s-150s with ambulation (deconditioned as well).  Cardiology following-remains on amiodarone/digoxin and metoprolol.  Anticoagulated with Eliquis.  Stable will be discharged home with outpatient cardiology follow-up.   HFrEF with exacerbation EF 45% : Volume status gradually improving-she is adequately diuresed placed on Demadex along with beta-blocker, low-dose ARB and Aldactone.  Request PCP to monitor blood pressure, electrolytes CMP, back within 5 to 7 days of discharge and also follow-up with cardiology outpatient   Acute hypoxic respiratory failure due to submassive PE: Intubated on 10/3-extubated on 10/4-resolved and stable on room air.   Bilateral lower extremity DVT along with submassive PE with RV strain/acute cor pulmonale and PEA arrest: S/p tPA on 10/3-currently on Eliquis.  Given significant clot burden-suspect  may require indefinite anticoagulation.   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  Aspirin stopped.  Continue statin.   Morbid Obesity: BMI 42, follow with PCP.   ? DM-2: Stable A1c continue appointment with PCP for monitoring.  Lab Results  Component Value Date   HGBA1C 5.9 (H) 06/25/2021    Discharge diagnosis     Principal Problem:   New onset atrial fibrillation (HCC) Active Problems:   Essential hypertension   Controlled diabetes mellitus type II without complication (HCC)   Elevated troponin   AKI (acute kidney injury) (HCC)   Microcytosis   Atrial fibrillation with RVR (HCC)   Pulmonary embolus  (HCC)    Discharge instructions    Discharge Instructions     Diet - low sodium heart healthy   Complete by: As directed    Discharge instructions   Complete by: As directed    Follow with Primary MD Merri Brunette, MD in 7 days   Get CBC, CMP, 2 view Chest X ray -  checked next visit within 1 week by Primary MD    Activity: As tolerated with Full fall precautions use walker/cane & assistance as needed  Disposition Home   Diet: Soft - Heart Healthy , with feeding assistance and aspiration precautions.  Strict 1.5 L fluid restriction per day.   Special Instructions: If you have smoked or chewed Tobacco  in the last 2 yrs please stop smoking, stop any regular Alcohol  and or any Recreational drug use.  On your next visit with your primary care physician please Get Medicines reviewed and adjusted.  Please request your Prim.MD to go over all Hospital Tests and Procedure/Radiological results at the follow up, please get all Hospital records sent to your Prim MD by signing hospital release before you go home.  If you experience worsening of your admission symptoms, develop shortness of breath, life threatening emergency, suicidal or homicidal thoughts you must seek medical attention immediately by calling 911 or calling your MD immediately  if symptoms less severe.  You Must read complete instructions/literature along with all the possible adverse reactions/side effects for all the Medicines you take and that have been prescribed to you. Take any new Medicines after you have completely understood and accpet all the possible adverse reactions/side effects.   Increase activity slowly   Complete by: As directed        Discharge Medications   Allergies as of 06/27/2021       Reactions   Bystolic [nebivolol Hcl]    Bradycardia   Coreg [carvedilol]    bradycardia   Minoxidil    Abdominal pain        Medication List     STOP taking these medications    aspirin 81 MG EC  tablet   cloNIDine 0.2 MG tablet Commonly known as: CATAPRES   hydrochlorothiazide 12.5 MG capsule Commonly known as: MICROZIDE   valsartan-hydrochlorothiazide 320-12.5 MG tablet Commonly known as: DIOVAN-HCT       TAKE these medications    acetaminophen 500 MG tablet Commonly known as: TYLENOL Take 500 mg by mouth daily as needed for mild pain or headache.   amiodarone 200 MG tablet Commonly known as: PACERONE Take 200 mg twice a day for 5 days then ONCE a day   apixaban 5 MG Tabs tablet Commonly known as: ELIQUIS Take 2 tablets (10 mg total) by mouth 2 (two) times daily for 2 days.   apixaban 5 MG Tabs tablet  Commonly known as: ELIQUIS Take 1 tablet (5 mg total) by mouth 2 (two) times daily. Start taking on: June 30, 2021   cetirizine 10 MG tablet Commonly known as: ZYRTEC Take 10 mg by mouth daily as needed for allergies.   digoxin 0.25 MG tablet Commonly known as: LANOXIN Take 1 tablet (0.25 mg total) by mouth daily. Start taking on: June 28, 2021   famotidine 20 MG tablet Commonly known as: PEPCID Take 1 tablet (20 mg total) by mouth daily.   losartan 25 MG tablet Commonly known as: Cozaar Take 0.5 tablets (12.5 mg total) by mouth daily.   metoprolol tartrate 100 MG tablet Commonly known as: LOPRESSOR Take 1 tablet (100 mg total) by mouth 2 (two) times daily.   rosuvastatin 5 MG tablet Commonly known as: CRESTOR TAKE 1 TABLET BY MOUTH EVERY DAY   spironolactone 25 MG tablet Commonly known as: ALDACTONE Take 1 tablet (25 mg total) by mouth daily.   torsemide 20 MG tablet Commonly known as: DEMADEX Take 1 tablet (20 mg total) by mouth daily. Start taking on: June 28, 2021   Vitamin D3 125 MCG (5000 UT) Caps Take 1 capsule by mouth daily.         Follow-up Information     [facility] Follow up.   Specialty: Home Health Services Why: Home Health PT services. Agency will contact you to schedule visit. Contact  information: [address] [place] [address] [phone]         Merri Brunette, MD. Schedule an appointment as soon as possible for a visit in 1 week(s).   Specialty: Internal Medicine Contact information: [address] [place] [address] [phone]         Yates Decamp, MD. Schedule an appointment as soon as possible for a visit in 1 week(s).   Specialty: Cardiology Contact information: [address] [place] [address] [phone]         Josephine Igo, DO. Schedule an appointment as soon as possible for a visit in 2 week(s).   Specialty: Pulmonary Disease Contact information: [address] [place] [address] [phone]                 Major procedures and Radiology Reports - PLEASE review detailed and final reports thoroughly  -       CT Angio Chest Pulmonary Embolism (PE) W or WO Contrast  Result Date: 06/21/2021 CLINICAL DATA:  High suspicion for pulmonary embolus. Respiratory failure. EXAM: CT ANGIOGRAPHY CHEST WITH CONTRAST TECHNIQUE: Multidetector CT imaging of the chest was performed using the standard protocol during bolus administration of intravenous contrast. Multiplanar CT image reconstructions and MIPs were obtained to evaluate the vascular anatomy. CONTRAST:  63mL OMNIPAQUE IOHEXOL 350 MG/ML SOLN COMPARISON:  Chest radiograph 06/19/2021 FINDINGS: Cardiovascular: There is abnormal filling defect in the right upper lobe pulmonary artery, right middle lobe pulmonary artery, and right lower lobe pulmonary artery compatible with acute pulmonary embolus. Questionable subsegmental embolus in the left lower lobe pulmonary artery. Right ventricular left ventricular ratio 1.2. Moderate cardiomegaly noted. Mediastinum/Nodes: Left supraclavicular node 0.9 cm in short axis on image 11 series 5. Lungs/Pleura: Moderate right pleural effusion with associated passive atelectasis. Patchy airspace  opacity in the right middle lobe and right lower lobe potentially from pulmonary hemorrhage or pneumonia. Moderate atelectasis in the left lower lobe. Endotracheal tube satisfactorily positioned in the trachea. Upper Abdomen: Unremarkable Musculoskeletal: Unremarkable Review  of the MIP images confirms the above findings. IMPRESSION: 1. Substantial pulmonary embolus in the right lung with lobar involvement of the upper lobe, lower lobe, and middle lobe pulmonary arteries. Positive for acute PE with CT evidence of right heart strain (RV/LV Ratio = 1.2) consistent with at least submassive (intermediate risk) PE. The presence of right heart strain has been associated with an increased risk of morbidity and mortality. Please refer to the "PE Focused" order set in EPIC. 2. Moderate cardiomegaly. 3. Moderate right pleural effusion. 4. Patchy airspace opacity in the right middle lobe and right lower lobe could be from hemorrhage or pneumonia. 5. Moderate atelectasis in the left lower lobe. Critical Value/emergent results were called by telephone at the time of interpretation on 06/21/2021 at 3:37 pm to provider Dr. Levon Hedger , who verbally acknowledged these results. Electronically Signed   By: Gaylyn Rong M.D.   On: 06/21/2021 15:38   DG CHEST PORT 1 VIEW  Result Date: 06/22/2021 CLINICAL DATA:  Right-sided PICC line placement. EXAM: PORTABLE CHEST 1 VIEW COMPARISON:  June 19, 2021 FINDINGS: An endotracheal tube is noted with its distal tip approximately 5.1 cm from the carina. A nasogastric tube is present with its distal end overlying the distal aspect of the esophagus. A right-sided PICC line is seen. Its distal tip is noted at the junction of the superior vena cava and right atrium. Mild to moderate severity areas of bibasilar atelectasis and/or infiltrate are seen. There are small bilateral pleural effusions. No pneumothorax is identified. The cardiac silhouette is markedly enlarged and unchanged in  size the visualized skeletal structures are unremarkable. IMPRESSION: 1. Mild to moderate severity bibasilar atelectasis and/or infiltrate. 2. Small bilateral pleural effusions. 3. A right-sided PICC line is seen with its distal tip at the junction of the superior vena cava and right atrium. 4. Endotracheal and nasogastric tube positioning, as described above. Further advancement of the NG tube by approximately 8 cm is recommended to decrease the risk of aspiration. Electronically Signed   By: Aram Candela M.D.   On: 06/22/2021 00:08   DG Chest Portable 1 View  Result Date: 06/19/2021 CLINICAL DATA:  New A. fib with RVR, new edema, worsening exertional shortness of breath rales on exam. EXAM: PORTABLE CHEST 1 VIEW COMPARISON:  None. FINDINGS: Cardiomegaly. No significant pleural effusion. No pneumothorax. No focal consolidation appreciated. No overt pulmonary edema. No acute osseous abnormality. IMPRESSION: Cardiomegaly without overt pulmonary edema. No significant effusion or consolidative process in the lungs. Electronically Signed   By: Olive Bass M.D.   On: 06/19/2021 13:05   ECHOCARDIOGRAM COMPLETE  Result Date: 06/22/2021    ECHOCARDIOGRAM REPORT   Patient Name:   Ashley [name] [facility] Date of Exam: 06/22/2021 Medical Rec #:  [id number]            Height:       67.0 in Accession #:    [id number]           Weight:       266.8 lb Date of Birth:  1946-08-18            BSA:          2.286 m Patient Age:    74 years             BP:           109/84 mmHg Patient Gender: F  HR:           116 bpm. Exam Location:  Inpatient Procedure: 2D Echo, Color Doppler, Cardiac Doppler and Intracardiac            Opacification Agent Indications:    Atrial fibrillation I48.91  History:        Patient has prior history of Echocardiogram examinations, most                 recent 06/21/2021. Risk Factors:Hypertension.  Sonographer:    Roosvelt Maser RDCS Referring Phys: [id number] [facility] J PATWARDHAN  IMPRESSIONS  1. Left ventricular ejection fraction, by estimation, is 40 to 45%. The left ventricle has mildly decreased function. LVEF is likely underestimated due to interentricular septal flatterning in systole and diastole s/o RV pressure and volume overload. There is moderate left ventricular hypertrophy. Left ventricular diastolic function could not be evaluated. There is the interventricular septum is flattened in systole and diastole, consistent with right ventricular pressure and volume overload.  2. Right ventricular systolic function is moderately reduced. The right ventricular size is mildly enlarged. There is mildly elevated pulmonary artery systolic pressure.  3. Left atrial size was severely dilated.  4. Right atrial size was severely dilated.  5. A small pericardial effusion is present. There is no evidence of cardiac tamponade.  6. The mitral valve is grossly normal. Moderate to severe mitral valve regurgitation.  7. Tricuspid valve regurgitation is severe.  8. The aortic valve is tricuspid. Aortic valve regurgitation is not visualized.  9. Above abnormalities are new compared to previous TTE in 2021. FINDINGS  Left Ventricle: Left ventricular ejection fraction, by estimation, is 40 to 45%. The left ventricle has mildly decreased function. The left ventricle demonstrates regional wall motion abnormalities. The left ventricular internal cavity size was normal in size. There is moderate left ventricular hypertrophy. The interventricular septum is flattened in systole and diastole, consistent with right ventricular pressure and volume overload. Left ventricular diastolic function could not be evaluated. Right Ventricle: The right ventricular size is mildly enlarged. No increase in right ventricular wall thickness. Right ventricular systolic function is moderately reduced. There is mildly elevated pulmonary artery systolic pressure. The tricuspid regurgitant velocity is 2.63 m/s, and with an assumed  right atrial pressure of 15 mmHg, the estimated right ventricular systolic pressure is 42.7 mmHg. Left Atrium: Left atrial size was severely dilated. Right Atrium: Right atrial size was severely dilated. Pericardium: A small pericardial effusion is present. There is no evidence of cardiac tamponade. Mitral Valve: The mitral valve is grossly normal. Moderate to severe mitral valve regurgitation. Tricuspid Valve: The tricuspid valve is grossly normal. Tricuspid valve regurgitation is severe. Aortic Valve: The aortic valve is tricuspid. Aortic valve regurgitation is not visualized. Pulmonic Valve: The pulmonic valve was grossly normal. Pulmonic valve regurgitation is mild. Aorta: The aortic root is normal in size and structure. Venous: The inferior vena cava was not well visualized. IAS/Shunts: No atrial level shunt detected by color flow Doppler.  LEFT VENTRICLE PLAX 2D LVIDd:         4.70 cm LVIDs:         4.10 cm LV PW:         1.40 cm LV IVS:        1.40 cm LVOT diam:     2.10 cm LV SV:         28 LV SV Index:   12 LVOT Area:     3.46 cm  LV Volumes (MOD) LV vol  d, MOD A2C: 151.0 ml LV vol d, MOD A4C: 126.0 ml LV vol s, MOD A2C: 93.5 ml LV vol s, MOD A4C: 65.3 ml LV SV MOD A2C:     57.5 ml LV SV MOD A4C:     126.0 ml LV SV MOD BP:      53.8 ml RIGHT VENTRICLE            IVC RV Basal diam:  4.40 cm    IVC diam: 2.60 cm RV Mid diam:    2.70 cm RV S prime:     8.49 cm/s TAPSE (M-mode): 1.2 cm LEFT ATRIUM              Index       RIGHT ATRIUM           Index LA diam:        5.40 cm  2.36 cm/m  RA Area:     33.10 cm LA Vol (A2C):   131.0 ml 57.32 ml/m RA Volume:   134.00 ml 58.63 ml/m LA Vol (A4C):   127.0 ml 55.57 ml/m LA Biplane Vol: 144.0 ml 63.00 ml/m  AORTIC VALVE LVOT Vmax:   60.10 cm/s LVOT Vmean:  41.800 cm/s LVOT VTI:    0.081 m  AORTA Ao Root diam: 2.80 cm Ao Asc diam:  3.90 cm TRICUSPID VALVE TR Peak grad:   27.7 mmHg TR Vmax:        263.00 cm/s  SHUNTS Systemic VTI:  0.08 m Systemic Diam: 2.10 cm  Truett Mainland MD Electronically signed by Truett Mainland MD Signature Date/Time: 06/22/2021/5:28:07 PM    Final    ECHO TEE  Result Date: 06/21/2021    TRANSESOPHOGEAL ECHO REPORT   Patient Name:   Ashley [name] [facility] Date of Exam: 06/21/2021 Medical Rec #:  [id number]            Height:       67.0 in Accession #:    [id number]           Weight:       266.8 lb Date of Birth:  04/28/1946            BSA:          2.286 m Patient Age:    74 years             BP:           109/93 mmHg Patient Gender: F                    HR:           106 bpm. Exam Location:  Inpatient Procedure: Transesophageal Echo Indications:     atrial fibrillation  History:         Patient has prior history of Echocardiogram examinations.  Sonographer:     Delcie Roch RDCS Referring Phys:  [id number] [facility] J PATWARDHAN Diagnosing Phys: Truett Mainland MD PROCEDURE: After discussion of the risks and benefits of a TEE, an informed consent was obtained from the patient. The transesophogeal probe was passed without difficulty through the esophogus of the patient. Sedation performed by different physician. The patient developed Hypxic respiratory failure, PEA arrest during the procedure. IMPRESSIONS  1. Left ventricular ejection fraction, by estimation, is 30 to 35%. The left ventricle has moderately decreased function. Left ventricular endocardial border not optimally defined to evaluate regional wall motion.  2. Right ventricular systolic function is severely reduced. The right ventricular size is severely enlarged.  3. Left atrial size was mildly dilated. No left atrial/left atrial appendage thrombus was detected.  4. Right atrial size was severely dilated.  5. A small pericardial effusion is present.  6. The mitral valve is grossly normal. Mild mitral valve regurgitation.  7. Tricuspid valve regurgitation is severe.  8. The aortic valve is tricuspid. Aortic valve regurgitation is not visualized.  9. RV enlargement is new, compared to  previous study in 2021. LVEF assessment is limited due to RV enlargment and tachycardia. Conser repeat TTE in a few days. FINDINGS  Left Ventricle: LV not well vidualized due to RV enlargement. LVEF assessmentdifficult due to rapid ventricular rate. Left ventricular ejection fraction, by estimation, is 30 to 35%. The left ventricle has moderately decreased function. Left ventricular  endocardial border not optimally defined to evaluate regional wall motion. The left ventricular internal cavity size was normal in size. Right Ventricle: The right ventricular size is severely enlarged. No increase in right ventricular wall thickness. Right ventricular systolic function is severely reduced. Left Atrium: Left atrial size was mildly dilated. No left atrial/left atrial appendage thrombus was detected. Right Atrium: Right atrial size was severely dilated. Pericardium: A small pericardial effusion is present. Mitral Valve: The mitral valve is grossly normal. Mild mitral valve regurgitation. Tricuspid Valve: The tricuspid valve is grossly normal. Tricuspid valve regurgitation is severe. Aortic Valve: The aortic valve is tricuspid. Aortic valve regurgitation is not visualized. Pulmonic Valve: The pulmonic valve was not well visualized. Pulmonic valve regurgitation is not visualized. Aorta: The aortic root was not well visualized. IAS/Shunts: No atrial level shunt detected by color flow Doppler. Truett Mainland MD Electronically signed by Truett Mainland MD Signature Date/Time: 06/21/2021/4:23:10 PM    Final    VAS [place] LOWER EXTREMITY VENOUS (DVT)  Result Date: 06/23/2021  Lower Venous DVT Study Patient Name:  Ashley [name] [facility]  Date of Exam:   06/23/2021 Medical Rec #: [id number]             Accession #:    [id number] Date of Birth: 1946-01-29             Patient Gender: F Patient Age:   33 years Exam Location:  [facility] Procedure:      VAS [place] LOWER EXTREMITY VENOUS (DVT) Referring Phys: Yates Decamp  --------------------------------------------------------------------------------  Indications: Pulmonary embolism.  Anticoagulation: Heparin. Limitations: Poor ultrasound/tissue interface and body habitus. Comparison Study: No prior studies. Performing Technologist: Jean Rosenthal RDMS, RVT  Examination Guidelines: A complete evaluation includes B-mode imaging, spectral Doppler, color Doppler, and power Doppler as needed of all accessible portions of each vessel. Bilateral testing is considered an integral part of a complete examination. Limited examinations for reoccurring indications may be performed as noted. The reflux portion of the exam is performed with the patient in reverse Trendelenburg.  +---------+---------------+---------+-----------+----------+-------------------+ RIGHT    CompressibilityPhasicitySpontaneityPropertiesThrombus Aging      +---------+---------------+---------+-----------+----------+-------------------+ CFV      Partial        Yes      Yes                  Acute               +---------+---------------+---------+-----------+----------+-------------------+ SFJ      Partial                                                          +---------+---------------+---------+-----------+----------+-------------------+  FV Prox                 Yes      Yes                                      +---------+---------------+---------+-----------+----------+-------------------+ FV Mid                  Yes      Yes                                      +---------+---------------+---------+-----------+----------+-------------------+ FV Distal               Yes      Yes                                      +---------+---------------+---------+-----------+----------+-------------------+ PFV                     Yes      Yes                                      +---------+---------------+---------+-----------+----------+-------------------+ POP                      Yes      Yes                                      +---------+---------------+---------+-----------+----------+-------------------+ PTV                                                   Not well visualized +---------+---------------+---------+-----------+----------+-------------------+ PERO                                                  Not well visualized +---------+---------------+---------+-----------+----------+-------------------+   +---------+---------------+---------+-----------+----------+--------------+ LEFT     CompressibilityPhasicitySpontaneityPropertiesThrombus Aging +---------+---------------+---------+-----------+----------+--------------+ CFV      Full           Yes      Yes                                 +---------+---------------+---------+-----------+----------+--------------+ SFJ      Full                                                        +---------+---------------+---------+-----------+----------+--------------+ FV Prox  Full                                                        +---------+---------------+---------+-----------+----------+--------------+  FV Mid   Full                                                        +---------+---------------+---------+-----------+----------+--------------+ FV DistalFull                                                        +---------+---------------+---------+-----------+----------+--------------+ PFV      Full                                                        +---------+---------------+---------+-----------+----------+--------------+ POP      Full           Yes      Yes                                 +---------+---------------+---------+-----------+----------+--------------+ PTV      None                                                        +---------+---------------+---------+-----------+----------+--------------+ PERO     None                                                         +---------+---------------+---------+-----------+----------+--------------+     Summary: RIGHT: - Findings consistent with acute deep vein thrombosis involving the right common femoral vein. - No cystic structure found in the popliteal fossa.  LEFT: - Findings consistent with age indeterminate deep vein thrombosis involving the left posterior tibial veins, and left peroneal veins. - No cystic structure found in the popliteal fossa.  *See table(s) above for measurements and observations. Electronically signed by Coral Else MD on 06/23/2021 at 4:53:11 PM.    Final    [place] EKG SITE RITE  Result Date: 06/21/2021 If Site Rite image not attached, placement could not be confirmed due to current cardiac rhythm.    Today   Subjective    Ashley [name] today has no headache,no chest abdominal pain,no new weakness tingling or numbness, feels much better wants to go home today.     Objective   Blood pressure 145/90, pulse (P) 89, temperature 97.7 F (36.5 C), temperature source Oral, resp. rate 16, height  (1.702 m), weight 124 kg, SpO2 (P) 98 %.   Intake/Output Summary (Last 24 hours) at 06/27/2021 1104 Last data filed at 06/27/2021 0300 Gross per 24 hour  Intake --  Output 450 ml  Net -450 ml    Exam  Awake Alert, No new F.N deficits, Normal affect [place].AT,PERRAL Supple Neck,No JVD,   Symmetrical Chest wall movement, Good air movement  bilaterally, CTAB RRR,No Gallops,Rubs or new Murmurs, No Parasternal Heave +ve B.Sounds, Abd Soft, Non tender,   No Cyanosis, trace edema   Data Review   CBC w Diff:  Lab Results  Component Value Date   WBC 8.3 06/27/2021   HGB 11.9 (L) 06/27/2021   HCT 37.6 06/27/2021   PLT 241 06/27/2021   LYMPHOPCT 23 06/19/2021   MONOPCT 9 06/19/2021   EOSPCT 0 06/19/2021   BASOPCT 0 06/19/2021    CMP:  Lab Results  Component Value Date   NA 134 (L) 06/27/2021   K 4.2 06/27/2021   CL 100 06/27/2021   CO2 28  06/27/2021   BUN 26 (H) 06/27/2021   CREATININE 0.95 06/27/2021   PROT 6.1 (L) 06/27/2021   ALBUMIN 2.2 (L) 06/27/2021   BILITOT 1.6 (H) 06/27/2021   ALKPHOS 86 06/27/2021   AST 27 06/27/2021   ALT 23 06/27/2021  .   Total Time in preparing paper work, data evaluation and todays exam - 35 minutes  Susa Raring M.D on 06/27/2021 at 11:04 AM  [facility]

## 2021-06-28 DIAGNOSIS — J9601 Acute respiratory failure with hypoxia: Secondary | ICD-10-CM | POA: Diagnosis not present

## 2021-06-28 DIAGNOSIS — I82401 Acute embolism and thrombosis of unspecified deep veins of right lower extremity: Secondary | ICD-10-CM | POA: Diagnosis not present

## 2021-06-28 DIAGNOSIS — I110 Hypertensive heart disease with heart failure: Secondary | ICD-10-CM | POA: Diagnosis not present

## 2021-06-28 DIAGNOSIS — I4891 Unspecified atrial fibrillation: Secondary | ICD-10-CM | POA: Diagnosis not present

## 2021-06-28 DIAGNOSIS — I5032 Chronic diastolic (congestive) heart failure: Secondary | ICD-10-CM | POA: Diagnosis not present

## 2021-06-28 DIAGNOSIS — I2699 Other pulmonary embolism without acute cor pulmonale: Secondary | ICD-10-CM | POA: Diagnosis not present

## 2021-06-29 ENCOUNTER — Other Ambulatory Visit (HOSPITAL_COMMUNITY): Payer: Self-pay

## 2021-06-29 ENCOUNTER — Other Ambulatory Visit: Payer: Self-pay

## 2021-06-29 MED ORDER — DIGOXIN 250 MCG PO TABS
0.2500 mg | ORAL_TABLET | Freq: Every day | ORAL | 3 refills | Status: DC
  Filled 2021-06-29 (×3): qty 30, 30d supply, fill #0

## 2021-07-01 DIAGNOSIS — I2699 Other pulmonary embolism without acute cor pulmonale: Secondary | ICD-10-CM | POA: Diagnosis not present

## 2021-07-01 DIAGNOSIS — J9601 Acute respiratory failure with hypoxia: Secondary | ICD-10-CM | POA: Diagnosis not present

## 2021-07-01 DIAGNOSIS — I82401 Acute embolism and thrombosis of unspecified deep veins of right lower extremity: Secondary | ICD-10-CM | POA: Diagnosis not present

## 2021-07-01 DIAGNOSIS — I5032 Chronic diastolic (congestive) heart failure: Secondary | ICD-10-CM | POA: Diagnosis not present

## 2021-07-01 DIAGNOSIS — I4891 Unspecified atrial fibrillation: Secondary | ICD-10-CM | POA: Diagnosis not present

## 2021-07-01 DIAGNOSIS — I110 Hypertensive heart disease with heart failure: Secondary | ICD-10-CM | POA: Diagnosis not present

## 2021-07-02 ENCOUNTER — Other Ambulatory Visit: Payer: Self-pay

## 2021-07-02 ENCOUNTER — Encounter: Payer: Self-pay | Admitting: Student

## 2021-07-02 ENCOUNTER — Ambulatory Visit: Payer: Medicare Other | Admitting: Student

## 2021-07-02 VITALS — BP 169/93 | HR 73 | Temp 98.5°F | Resp 17 | Ht 67.0 in | Wt 265.0 lb

## 2021-07-02 DIAGNOSIS — I10 Essential (primary) hypertension: Secondary | ICD-10-CM

## 2021-07-02 DIAGNOSIS — Z8673 Personal history of transient ischemic attack (TIA), and cerebral infarction without residual deficits: Secondary | ICD-10-CM

## 2021-07-02 DIAGNOSIS — I5020 Unspecified systolic (congestive) heart failure: Secondary | ICD-10-CM

## 2021-07-02 MED ORDER — ROSUVASTATIN CALCIUM 5 MG PO TABS: 5.0000 mg | ORAL_TABLET | Freq: Every day | ORAL | 3 refills | Status: DC

## 2021-07-02 NOTE — Progress Notes (Signed)
Primary Physician/Referring:  Deland Pretty, MD  Patient ID: Ashley [name], female    DOB: 13-Apr-1946, 75 y.o.   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 female  with  morbid obesity, hypertension, chronic dyspnea on exertion, abnormal EKG with a negative nuclear stress test in 2016. 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  Patient presents for hospital follow. She presented to [facility] emergency department 04/04/2020 with complaint of confusion and found to be bradycardic however she was seen by Dr. Virgina Jock who felt presentation could not be explained by bradycardia. Further evaluation revealed subacute right temporal lobe ischemic infarction.   Patient admitted 06/19/2021 - 06/27/2021 with A. 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  She now presents for with her granddaughter present at bedside.  She is presently wheelchair-bound.  Since hospitalization she is feeling better.  Denies chest pain, palpitations, dizziness, syncope, near syncope.  She does have mild dyspnea on exertion, particularly with home physical therapy.  Blood pressure is well controlled on home readings with physical therapist.  Patient was discharged on Lopressor 100 mg p.o. twice daily, Torsemide 20 mg p.o. daily, and Eliquis.  She is tolerating anticoagulation without bleeding diathesis.  Notably during recent hospitalization attempted TEE cardioversion, however procedure was aborted due to hypotension and hypoxia.  She previously received CPR for possible PEA.  Cardioversion was not performed given severe RV dysfunction and high suspicion for PE.  Patient required intubation, which she was weaned off.  Past Medical History:  Diagnosis Date   Essential hypertension  11/23/2018   Obesity 11/23/2018   Stroke [facility])    Past Surgical History:  Procedure Laterality Date   CARDIOVERSION N/A 06/21/2021   Procedure: CARDIOVERSION;  Surgeon: Nigel Mormon, MD;  Location: [facility] ENDOSCOPY;  Service: Cardiovascular;  Laterality: N/A;   TEE WITHOUT CARDIOVERSION N/A 06/21/2021   Procedure: TRANSESOPHAGEAL ECHOCARDIOGRAM (TEE);  Surgeon: Nigel Mormon, MD;  Location: [facility] ENDOSCOPY;  Service: Cardiovascular;  Laterality: N/A;   Family History  Problem Relation Age of Onset   Stroke Mother    Hypertension Mother     Social History   Tobacco Use   Smoking status: Never   Smokeless tobacco: Never  Substance Use Topics   Alcohol use: No   Marital Status: Married   ROS  Review of Systems  Constitutional: Negative for malaise/fatigue and weight gain.  Cardiovascular:  Positive for dyspnea on exertion. Negative for chest pain, claudication, leg swelling, near-syncope, orthopnea, palpitations, paroxysmal nocturnal dyspnea and syncope.  Hematologic/Lymphatic: Does not bruise/bleed easily.  Gastrointestinal:  Negative for melena.  Neurological:  Negative for dizziness and weakness.   Objective  Blood pressure (!) 169/93, pulse 73, temperature 98.5 F (36.9 C), temperature source Temporal, resp. rate 17, height _0  (1.702 m), weight 265 lb (120.2 kg), SpO2 94 %.  Vitals with BMI 07/02/2021 07/02/2021 06/27/2021  Height - _1  -  Weight - 265 lbs -  BMI - 36.6 -  Systolic 440 347 425  Diastolic 93 91 95  Pulse 73 72 77      Physical Exam Vitals reviewed.  Constitutional:      Appearance: She is obese.     Comments: Wheelchair-bound  HENT:     Head:  Normocephalic and atraumatic.  Cardiovascular:     Rate and Rhythm: Normal rate.     Pulses: Intact distal pulses.          Carotid pulses are 2+ on the right side and 2+ on the left side.      Radial pulses are 2+ on the right side and 2+ on the left side.       Dorsalis pedis pulses are 2+ on  the right side and 2+ on the left side.       Posterior tibial pulses are 2+ on the right side and 2+ on the left side.     Heart sounds: S1 normal and S2 normal. No murmur heard.   No gallop.     Comments: Femoral and popliteal pulses difficult to evaluate due to patient's body habitus.  Pulmonary:     Effort: Pulmonary effort is normal. No respiratory distress.     Breath sounds: No wheezing, rhonchi or rales.  Musculoskeletal:     Right lower leg: Edema (minimal) present.     Left lower leg: Edema (minimal) present.  Neurological:     Mental Status: She is alert.    Laboratory examination:   Recent Labs    06/24/21 0358 06/25/21 0152 06/27/21 0442  NA 136 136 134*  K 4.3 4.5 4.2  CL 101 103 100  CO2 _0 GLUCOSE 136* 106* 117*  BUN 37* 35* 26*  CREATININE 1.09* 0.88 0.95  CALCIUM 9.0 9.1 9.1  GFRNONAA 53* >60 >60   estimated creatinine clearance is 69.7 mL/min (by C-G formula based on SCr of 0.95 mg/dL).  CMP Latest Ref Rng & Units 06/27/2021 06/25/2021 06/24/2021  Glucose 70 - 99 mg/dL 117(H) 106(H) 136(H)  BUN 8 - 23 mg/dL 26(H) 35(H) 37(H)  Creatinine 0.44 - 1.00 mg/dL 0.95 0.88 1.09(H)  Sodium 135 - 145 mmol/L 134(L) 136 136  Potassium 3.5 - 5.1 mmol/L 4.2 4.5 4.3  Chloride 98 - 111 mmol/L 100 103 101  CO2 22 - 32 mmol/L _1 Calcium 8.9 - 10.3 mg/dL 9.1 9.1 9.0  Total Protein 6.5 - 8.1 g/dL 6.1(L) - -  Total Bilirubin 0.3 - 1.2 mg/dL 1.6(H) - -  Alkaline Phos 38 - 126 U/L 86 - -  AST 15 - 41 U/L 27 - -  ALT 0 - 44 U/L 23 - -   CBC Latest Ref Rng & Units 06/27/2021 06/26/2021 06/25/2021  WBC 4.0 - 10.5 K/uL 8.3 8.7 9.3  Hemoglobin 12.0 - 15.0 g/dL 11.9(L) 11.5(L) 11.4(L)  Hematocrit 36.0 - 46.0 % 37.6 36.0 36.7  Platelets 150 - 400 K/uL 241 215 199    Lipid Panel Recent Labs    06/22/21 0302  TRIG 95    HEMOGLOBIN A1C Lab Results  Component Value Date   HGBA1C 5.9 (H) 06/25/2021   MPG 122.63 06/25/2021   TSH Recent Labs     06/19/21 1229  TSH 1.412    External labs:   10/15/2019:  Hb 13.1/HCT 40.0, platelets 182. Serum glucose 104 mg, BUN 10, creatinine 0.91, EGFR >60 mL.  Potassium 4.6.  CMP normal. Total cholesterol 162, triglycerides 81, HDL 70, LDL 77.  Vitamin D 49.5. A1c 6.3%.  Allergies   Allergies  Allergen Reactions   Bystolic [Nebivolol Hcl]     Bradycardia    Coreg [Carvedilol]     bradycardia   Minoxidil     Abdominal pain    Medications Prior to Visit:  Outpatient Medications Prior to Visit  Medication Sig Dispense Refill   acetaminophen (TYLENOL) 500 MG tablet Take 500 mg by mouth daily as needed for mild pain or headache.     amiodarone (PACERONE) 200 MG tablet Take 200 mg twice a day for 5 days then ONCE a day 40 tablet 0   apixaban (ELIQUIS) 5 MG TABS tablet Take 1 tablet (5 mg total) by mouth 2 (two) times daily. 60 tablet 0   cetirizine (ZYRTEC) 10 MG tablet Take 10 mg by mouth daily as needed for allergies.     Cholecalciferol (VITAMIN D3) 5000 units CAPS Take 1 capsule by mouth daily.     digoxin (LANOXIN) 0.25 MG tablet Take 1 tablet (0.25 mg total) by mouth daily. 30 tablet 3   famotidine (PEPCID) 20 MG tablet Take 1 tablet (20 mg total) by mouth daily. 30 tablet 0   losartan (COZAAR) 25 MG tablet Take 0.5 tablets (12.5 mg total) by mouth daily. 30 tablet 0   metoprolol tartrate (LOPRESSOR) 100 MG tablet Take 1 tablet (100 mg total) by mouth 2 (two) times daily. 60 tablet 0   spironolactone (ALDACTONE) 25 MG tablet Take 1 tablet (25 mg total) by mouth daily. 30 tablet 0   torsemide (DEMADEX) 20 MG tablet Take 1 tablet (20 mg total) by mouth daily. 30 tablet 0   apixaban (ELIQUIS) 5 MG TABS tablet Take 2 tablets (10 mg total) by mouth 2 (two) times daily for 2 days. 8 tablet 0   rosuvastatin (CRESTOR) 5 MG tablet TAKE 1 TABLET BY MOUTH EVERY DAY (Patient taking differently: Take 5 mg by mouth daily.) 90 tablet 2   No facility-administered medications prior to visit.    Final Medications at End of Visit    Current Meds  Medication Sig   acetaminophen (TYLENOL) 500 MG tablet Take 500 mg by mouth daily as needed for mild pain or headache.   amiodarone (PACERONE) 200 MG tablet Take 200 mg twice a day for 5 days then ONCE a day   apixaban (ELIQUIS) 5 MG TABS tablet Take 1 tablet (5 mg total) by mouth 2 (two) times daily.   cetirizine (ZYRTEC) 10 MG tablet Take 10 mg by mouth daily as needed for allergies.   Cholecalciferol (VITAMIN D3) 5000 units CAPS Take 1 capsule by mouth daily.   digoxin (LANOXIN) 0.25 MG tablet Take 1 tablet (0.25 mg total) by mouth daily.   famotidine (PEPCID) 20 MG tablet Take 1 tablet (20 mg total) by mouth daily.   losartan (COZAAR) 25 MG tablet Take 0.5 tablets (12.5 mg total) by mouth daily.   metoprolol tartrate (LOPRESSOR) 100 MG tablet Take 1 tablet (100 mg total) by mouth 2 (two) times daily.   spironolactone (ALDACTONE) 25 MG tablet Take 1 tablet (25 mg total) by mouth daily.   torsemide (DEMADEX) 20 MG tablet Take 1 tablet (20 mg total) by mouth daily.   [DISCONTINUED] apixaban (ELIQUIS) 5 MG TABS tablet Take 2 tablets (10 mg total) by mouth 2 (two) times daily for 2 days.   [DISCONTINUED] rosuvastatin (CRESTOR) 5 MG tablet TAKE 1 TABLET BY MOUTH EVERY DAY (Patient taking differently: Take 5 mg by mouth daily.)   Radiology:   No results found. CTA chest 07/18/2021: 1. Substantial pulmonary embolus in the right lung with lobar involvement of the upper lobe, lower lobe, and middle lobe pulmonary arteries. Positive for acute PE with CT evidence of right heart strain (RV/LV Ratio = 1.2) consistent with at least submassive (  intermediate risk) PE. The presence of right heart strain has been associated with an increased risk of morbidity and mortality. Please refer to the "PE Focused" order set in EPIC. 2. Moderate cardiomegaly. 3. Moderate right pleural effusion. 4. Patchy airspace opacity in the right middle lobe and right  lower lobe could be from hemorrhage or pneumonia. 5. Moderate atelectasis in the left lower lobe.  CT ANGIO NECK W OR WO CONTRAST 04/05/2020 1. 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  2. Otherwise negative CTA of the head and neck. No other large vessel occlusion, hemodynamically significant stenosis, or other acute vascular abnormality.  3. 3 mm subpleural left upper lobe nodule, indeterminate. No follow-up needed if patient is low-risk. Non-contrast chest CT can be considered in 12 months if patient is high-risk. This recommendation follows the consensus statement: Guidelines for Management of Incidental Pulmonary Nodules Detected on CT Images: From the Fleischner Society 2017; Radiology 2017; 284:228-243.   CT HEAD WO CONTRAST 04/04/2020 1. Loss of gray-white differentiation geographic region of ill-defined hypoattenuation in the right temporal lobe concerning for age-indeterminate infarct, possibly late acute to subacute.  2. No other acute intracranial abnormality.  3. Chronic microvascular angiopathy and parenchymal volume loss.  4. Extensive periapical lucencies of the maxillary dentition including a left maxillary molar possibly suggesting an odontogenic origin of left maxillary sinus disease. Correlate with dental exam.    DG Chest Port 1 View 04/04/2020 No active disease.  Cardiac Studies:  Bilateral DVT study lower extremity 06/23/2021: RIGHT:  - Findings consistent with acute deep vein thrombosis involving the right  common femoral vein.  - No cystic structure found in the popliteal fossa.     LEFT:  - Findings consistent with age indeterminate deep vein thrombosis  involving the left posterior tibial veins, and left peroneal veins.  - No cystic structure found in the popliteal fossa.   Echocardiogram 06/22/2021:  1. Left ventricular ejection fraction, by estimation, is 40 to 45%. The  left  ventricle has mildly decreased function. LVEF is likely  underestimated due to interentricular septal flatterning in systole and  diastole s/o RV pressure and volume overload.  There is moderate left ventricular hypertrophy. Left ventricular diastolic  function could not be evaluated. There is the interventricular septum is  flattened in systole and diastole, consistent with right ventricular  pressure and volume overload.   2. Right ventricular systolic function is moderately reduced. The right  ventricular size is mildly enlarged. There is mildly elevated pulmonary  artery systolic pressure.   3. Left atrial size was severely dilated.   4. Right atrial size was severely dilated.   5. A small pericardial effusion is present. There is no evidence of  cardiac tamponade.   6. The mitral valve is grossly normal. Moderate to severe mitral valve  regurgitation.   7. Tricuspid valve regurgitation is severe.   8. The aortic valve is tricuspid. Aortic valve regurgitation is not  visualized.   9. Above abnormalities are new compared to previous TTE in 2021.   Lexiscan Myoview nuclear stress test 03/02/2015: 1. The resting electrocardiogram demonstrated normal sinus rhythm, IVCD, LVH, no resting arrhythmias and nonspecific ST-T changes. Stress EKG is non-diagnostic for ischemia as it a pharmacologic stress using Lexiscan. Occasional PVC and atrial couplets and PAC. 2. Myocardial perfusion imaging is normal. Overall left ventricular systolic function was normal without regional wall motion abnormalities. The left ventricular ejection fraction was 54%.  EKG:  07/02/2021:  Atrial fibrillation with controlled ventricular response at a rate of 78 bpm. Anteroseptal infarct old, diffuse nonspecific ST-T abnormality. Unchanged compared to 06/19/2021, but now well rate controlled.   08/24/2020: Underlying sinus rhythm with premature atrial complexes at rate of 60 bpm.  Normal axis.  LVH with repolarization  abnormality. Lateral T wave inversions, cannot exclude ischemia.   04/05/2020: sinus rhythm sinus bradycardia, rate 30-60 bpm, with frequent PACs. Inferolateral T wave inversions, not new.  04/08/19/20: Normal sinus rhythm at the rate of 60 bpm, frequent PACs, 6 beat run of atrial tachycardia, LVH with repolarization abnormality.  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 No significant change from  EKG 11/23/2018. No significant change compared to 08/08/2018.  PACs more frequent.  Assessment     ICD-10-CM   1. Essential hypertension  I10 EKG 12-Lead    2. History of CVA (cerebrovascular accident) without residual deficits  Z86.73 rosuvastatin (CRESTOR) 5 MG tablet    3. HFrEF (heart failure with reduced ejection fraction) (HCC)  Z36.64 Basic metabolic panel    Brain natriuretic peptide    Magnesium       Medications Discontinued During This Encounter  Medication Reason   apixaban (ELIQUIS) 5 MG TABS tablet    rosuvastatin (CRESTOR) 5 MG tablet Reorder    Meds ordered this encounter  Medications   rosuvastatin (CRESTOR) 5 MG tablet    Sig: Take 1 tablet (5 mg total) by mouth daily.    Dispense:  90 tablet    Refill:  3    Recommendations:   Ashley [name] is a 75 y.o. female  with  morbid obesity, hypertension, chronic dyspnea on exertion, abnormal EKG with a negative nuclear stress test in 2016. 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  Patient presents for follow-up of CVA diagnosed during hospitalization in 03/2020.  Patient was subsequent 10th 1/22 - 06/27/2021 with atrial fibrillation with RVR, as well as massive PE.  Patient's rate control medications were uptitrated to Lopressor 100 mg p.o. twice daily she was started on anticoagulation with Eliquis.  Patient is well rate controlled at today's office visit and tolerating anticoagulation without bleeding diathesis.  During recent admission attempted TEE cardioversion, however procedure  was aborted given significant hypotension and hypoxia.  We will plan to bring her back and consider cardioversion in 3 to 4 weeks if patient remains in atrial fibrillation after she has been on anticoagulation.  On physical exam patient is relatively euvolemic, however she is complaining of dyspnea on exertion.  We will therefore obtain BNP as well as BMP and magnesium.  Patient is also requesting refill of statin medication, I have done this. Suspect patient's dyspnea on exertion is multifactorial including deconditioning.  For now we will continue torsemide 20 mg p.o. daily.  Patient is blood pressure is markedly elevated in the office, however it has been monitored recently at other provider offices and by [facility] and is well controlled.  Consider enrolling patient in remote patient monitoring at next office visit.  Follow-up in 3 weeks, sooner if needed, for A. fib, hypertension.  She is following with pulmonology for management of PE.   Alethia Berthold, PA-C 07/05/2021, 11:15 AM Office: [phone]

## 2021-07-03 DIAGNOSIS — I4891 Unspecified atrial fibrillation: Secondary | ICD-10-CM | POA: Diagnosis not present

## 2021-07-03 DIAGNOSIS — I82401 Acute embolism and thrombosis of unspecified deep veins of right lower extremity: Secondary | ICD-10-CM | POA: Diagnosis not present

## 2021-07-03 DIAGNOSIS — I110 Hypertensive heart disease with heart failure: Secondary | ICD-10-CM | POA: Diagnosis not present

## 2021-07-03 DIAGNOSIS — I5032 Chronic diastolic (congestive) heart failure: Secondary | ICD-10-CM | POA: Diagnosis not present

## 2021-07-03 DIAGNOSIS — I2699 Other pulmonary embolism without acute cor pulmonale: Secondary | ICD-10-CM | POA: Diagnosis not present

## 2021-07-03 DIAGNOSIS — J9601 Acute respiratory failure with hypoxia: Secondary | ICD-10-CM | POA: Diagnosis not present

## 2021-07-07 DIAGNOSIS — I82401 Acute embolism and thrombosis of unspecified deep veins of right lower extremity: Secondary | ICD-10-CM | POA: Diagnosis not present

## 2021-07-07 DIAGNOSIS — I5032 Chronic diastolic (congestive) heart failure: Secondary | ICD-10-CM | POA: Diagnosis not present

## 2021-07-07 DIAGNOSIS — J9601 Acute respiratory failure with hypoxia: Secondary | ICD-10-CM | POA: Diagnosis not present

## 2021-07-07 DIAGNOSIS — I4891 Unspecified atrial fibrillation: Secondary | ICD-10-CM | POA: Diagnosis not present

## 2021-07-07 DIAGNOSIS — I110 Hypertensive heart disease with heart failure: Secondary | ICD-10-CM | POA: Diagnosis not present

## 2021-07-07 DIAGNOSIS — I2699 Other pulmonary embolism without acute cor pulmonale: Secondary | ICD-10-CM | POA: Diagnosis not present

## 2021-07-09 DIAGNOSIS — I82401 Acute embolism and thrombosis of unspecified deep veins of right lower extremity: Secondary | ICD-10-CM | POA: Diagnosis not present

## 2021-07-09 DIAGNOSIS — I4891 Unspecified atrial fibrillation: Secondary | ICD-10-CM | POA: Diagnosis not present

## 2021-07-09 DIAGNOSIS — I110 Hypertensive heart disease with heart failure: Secondary | ICD-10-CM | POA: Diagnosis not present

## 2021-07-09 DIAGNOSIS — J9601 Acute respiratory failure with hypoxia: Secondary | ICD-10-CM | POA: Diagnosis not present

## 2021-07-09 DIAGNOSIS — I2699 Other pulmonary embolism without acute cor pulmonale: Secondary | ICD-10-CM | POA: Diagnosis not present

## 2021-07-09 DIAGNOSIS — I5032 Chronic diastolic (congestive) heart failure: Secondary | ICD-10-CM | POA: Diagnosis not present

## 2021-07-12 DIAGNOSIS — I4891 Unspecified atrial fibrillation: Secondary | ICD-10-CM | POA: Diagnosis not present

## 2021-07-12 DIAGNOSIS — I2699 Other pulmonary embolism without acute cor pulmonale: Secondary | ICD-10-CM | POA: Diagnosis not present

## 2021-07-12 DIAGNOSIS — J9601 Acute respiratory failure with hypoxia: Secondary | ICD-10-CM | POA: Diagnosis not present

## 2021-07-12 DIAGNOSIS — I5032 Chronic diastolic (congestive) heart failure: Secondary | ICD-10-CM | POA: Diagnosis not present

## 2021-07-12 DIAGNOSIS — I110 Hypertensive heart disease with heart failure: Secondary | ICD-10-CM | POA: Diagnosis not present

## 2021-07-12 DIAGNOSIS — I82401 Acute embolism and thrombosis of unspecified deep veins of right lower extremity: Secondary | ICD-10-CM | POA: Diagnosis not present

## 2021-07-14 DIAGNOSIS — I4891 Unspecified atrial fibrillation: Secondary | ICD-10-CM | POA: Diagnosis not present

## 2021-07-14 DIAGNOSIS — I82401 Acute embolism and thrombosis of unspecified deep veins of right lower extremity: Secondary | ICD-10-CM | POA: Diagnosis not present

## 2021-07-14 DIAGNOSIS — I5032 Chronic diastolic (congestive) heart failure: Secondary | ICD-10-CM | POA: Diagnosis not present

## 2021-07-14 DIAGNOSIS — I2699 Other pulmonary embolism without acute cor pulmonale: Secondary | ICD-10-CM | POA: Diagnosis not present

## 2021-07-14 DIAGNOSIS — I110 Hypertensive heart disease with heart failure: Secondary | ICD-10-CM | POA: Diagnosis not present

## 2021-07-14 DIAGNOSIS — J9601 Acute respiratory failure with hypoxia: Secondary | ICD-10-CM | POA: Diagnosis not present

## 2021-07-15 DIAGNOSIS — J9601 Acute respiratory failure with hypoxia: Secondary | ICD-10-CM | POA: Diagnosis not present

## 2021-07-15 DIAGNOSIS — I5032 Chronic diastolic (congestive) heart failure: Secondary | ICD-10-CM | POA: Diagnosis not present

## 2021-07-15 DIAGNOSIS — I82401 Acute embolism and thrombosis of unspecified deep veins of right lower extremity: Secondary | ICD-10-CM | POA: Diagnosis not present

## 2021-07-15 DIAGNOSIS — I4891 Unspecified atrial fibrillation: Secondary | ICD-10-CM | POA: Diagnosis not present

## 2021-07-15 DIAGNOSIS — I110 Hypertensive heart disease with heart failure: Secondary | ICD-10-CM | POA: Diagnosis not present

## 2021-07-15 DIAGNOSIS — I2699 Other pulmonary embolism without acute cor pulmonale: Secondary | ICD-10-CM | POA: Diagnosis not present

## 2021-07-16 DIAGNOSIS — Z7901 Long term (current) use of anticoagulants: Secondary | ICD-10-CM | POA: Diagnosis not present

## 2021-07-16 DIAGNOSIS — K219 Gastro-esophageal reflux disease without esophagitis: Secondary | ICD-10-CM | POA: Diagnosis not present

## 2021-07-16 DIAGNOSIS — I4891 Unspecified atrial fibrillation: Secondary | ICD-10-CM | POA: Diagnosis not present

## 2021-07-18 ENCOUNTER — Other Ambulatory Visit: Payer: Self-pay | Admitting: Student

## 2021-07-19 DIAGNOSIS — J9601 Acute respiratory failure with hypoxia: Secondary | ICD-10-CM | POA: Diagnosis not present

## 2021-07-19 DIAGNOSIS — I110 Hypertensive heart disease with heart failure: Secondary | ICD-10-CM | POA: Diagnosis not present

## 2021-07-19 DIAGNOSIS — I2699 Other pulmonary embolism without acute cor pulmonale: Secondary | ICD-10-CM | POA: Diagnosis not present

## 2021-07-19 DIAGNOSIS — I82401 Acute embolism and thrombosis of unspecified deep veins of right lower extremity: Secondary | ICD-10-CM | POA: Diagnosis not present

## 2021-07-19 DIAGNOSIS — I5032 Chronic diastolic (congestive) heart failure: Secondary | ICD-10-CM | POA: Diagnosis not present

## 2021-07-19 DIAGNOSIS — I4891 Unspecified atrial fibrillation: Secondary | ICD-10-CM | POA: Diagnosis not present

## 2021-07-20 DIAGNOSIS — I5032 Chronic diastolic (congestive) heart failure: Secondary | ICD-10-CM | POA: Diagnosis not present

## 2021-07-20 DIAGNOSIS — I4891 Unspecified atrial fibrillation: Secondary | ICD-10-CM | POA: Diagnosis not present

## 2021-07-20 DIAGNOSIS — I110 Hypertensive heart disease with heart failure: Secondary | ICD-10-CM | POA: Diagnosis not present

## 2021-07-20 DIAGNOSIS — I2699 Other pulmonary embolism without acute cor pulmonale: Secondary | ICD-10-CM | POA: Diagnosis not present

## 2021-07-20 DIAGNOSIS — I82411 Acute embolism and thrombosis of right femoral vein: Secondary | ICD-10-CM | POA: Diagnosis not present

## 2021-07-20 DIAGNOSIS — E119 Type 2 diabetes mellitus without complications: Secondary | ICD-10-CM | POA: Diagnosis not present

## 2021-07-21 DIAGNOSIS — I5032 Chronic diastolic (congestive) heart failure: Secondary | ICD-10-CM | POA: Diagnosis not present

## 2021-07-21 DIAGNOSIS — E119 Type 2 diabetes mellitus without complications: Secondary | ICD-10-CM | POA: Diagnosis not present

## 2021-07-21 DIAGNOSIS — I110 Hypertensive heart disease with heart failure: Secondary | ICD-10-CM | POA: Diagnosis not present

## 2021-07-21 DIAGNOSIS — I82411 Acute embolism and thrombosis of right femoral vein: Secondary | ICD-10-CM | POA: Diagnosis not present

## 2021-07-21 DIAGNOSIS — I4891 Unspecified atrial fibrillation: Secondary | ICD-10-CM | POA: Diagnosis not present

## 2021-07-21 DIAGNOSIS — I2699 Other pulmonary embolism without acute cor pulmonale: Secondary | ICD-10-CM | POA: Diagnosis not present

## 2021-07-23 DIAGNOSIS — I5032 Chronic diastolic (congestive) heart failure: Secondary | ICD-10-CM | POA: Diagnosis not present

## 2021-07-23 DIAGNOSIS — E119 Type 2 diabetes mellitus without complications: Secondary | ICD-10-CM | POA: Diagnosis not present

## 2021-07-23 DIAGNOSIS — I4891 Unspecified atrial fibrillation: Secondary | ICD-10-CM | POA: Diagnosis not present

## 2021-07-23 DIAGNOSIS — I2699 Other pulmonary embolism without acute cor pulmonale: Secondary | ICD-10-CM | POA: Diagnosis not present

## 2021-07-23 DIAGNOSIS — I110 Hypertensive heart disease with heart failure: Secondary | ICD-10-CM | POA: Diagnosis not present

## 2021-07-23 DIAGNOSIS — I82411 Acute embolism and thrombosis of right femoral vein: Secondary | ICD-10-CM | POA: Diagnosis not present

## 2021-07-24 DIAGNOSIS — I110 Hypertensive heart disease with heart failure: Secondary | ICD-10-CM | POA: Diagnosis not present

## 2021-07-24 DIAGNOSIS — I4891 Unspecified atrial fibrillation: Secondary | ICD-10-CM | POA: Diagnosis not present

## 2021-07-24 DIAGNOSIS — E119 Type 2 diabetes mellitus without complications: Secondary | ICD-10-CM | POA: Diagnosis not present

## 2021-07-24 DIAGNOSIS — I5032 Chronic diastolic (congestive) heart failure: Secondary | ICD-10-CM | POA: Diagnosis not present

## 2021-07-24 DIAGNOSIS — I2699 Other pulmonary embolism without acute cor pulmonale: Secondary | ICD-10-CM | POA: Diagnosis not present

## 2021-07-24 DIAGNOSIS — I82411 Acute embolism and thrombosis of right femoral vein: Secondary | ICD-10-CM | POA: Diagnosis not present

## 2021-07-26 DIAGNOSIS — I110 Hypertensive heart disease with heart failure: Secondary | ICD-10-CM | POA: Diagnosis not present

## 2021-07-26 DIAGNOSIS — E119 Type 2 diabetes mellitus without complications: Secondary | ICD-10-CM | POA: Diagnosis not present

## 2021-07-26 DIAGNOSIS — I82411 Acute embolism and thrombosis of right femoral vein: Secondary | ICD-10-CM | POA: Diagnosis not present

## 2021-07-26 DIAGNOSIS — I2699 Other pulmonary embolism without acute cor pulmonale: Secondary | ICD-10-CM | POA: Diagnosis not present

## 2021-07-26 DIAGNOSIS — I5032 Chronic diastolic (congestive) heart failure: Secondary | ICD-10-CM | POA: Diagnosis not present

## 2021-07-26 DIAGNOSIS — I4891 Unspecified atrial fibrillation: Secondary | ICD-10-CM | POA: Diagnosis not present

## 2021-07-27 ENCOUNTER — Other Ambulatory Visit (HOSPITAL_COMMUNITY): Payer: Self-pay

## 2021-07-28 ENCOUNTER — Telehealth: Payer: Self-pay | Admitting: Student

## 2021-07-28 ENCOUNTER — Other Ambulatory Visit: Payer: Self-pay

## 2021-07-28 ENCOUNTER — Emergency Department (HOSPITAL_BASED_OUTPATIENT_CLINIC_OR_DEPARTMENT_OTHER): Payer: Medicare Other | Admitting: Radiology

## 2021-07-28 ENCOUNTER — Inpatient Hospital Stay (HOSPITAL_BASED_OUTPATIENT_CLINIC_OR_DEPARTMENT_OTHER)
Admission: EM | Admit: 2021-07-28 | Discharge: 2021-08-02 | DRG: 280 | Disposition: A | Discharge status: Home Health | Payer: Medicare Other | Attending: Family Medicine | Admitting: Family Medicine

## 2021-07-28 ENCOUNTER — Encounter (HOSPITAL_BASED_OUTPATIENT_CLINIC_OR_DEPARTMENT_OTHER): Payer: Self-pay | Admitting: Obstetrics and Gynecology

## 2021-07-28 DIAGNOSIS — Z823 Family history of stroke: Secondary | ICD-10-CM

## 2021-07-28 DIAGNOSIS — K5909 Other constipation: Secondary | ICD-10-CM | POA: Diagnosis not present

## 2021-07-28 DIAGNOSIS — R7989 Other specified abnormal findings of blood chemistry: Secondary | ICD-10-CM | POA: Diagnosis present

## 2021-07-28 DIAGNOSIS — E43 Unspecified severe protein-calorie malnutrition: Secondary | ICD-10-CM | POA: Diagnosis not present

## 2021-07-28 DIAGNOSIS — I5022 Chronic systolic (congestive) heart failure: Secondary | ICD-10-CM

## 2021-07-28 DIAGNOSIS — I82411 Acute embolism and thrombosis of right femoral vein: Secondary | ICD-10-CM | POA: Diagnosis not present

## 2021-07-28 DIAGNOSIS — R778 Other specified abnormalities of plasma proteins: Secondary | ICD-10-CM

## 2021-07-28 DIAGNOSIS — R601 Generalized edema: Secondary | ICD-10-CM

## 2021-07-28 DIAGNOSIS — Z8673 Personal history of transient ischemic attack (TIA), and cerebral infarction without residual deficits: Secondary | ICD-10-CM

## 2021-07-28 DIAGNOSIS — Z6841 Body Mass Index (BMI) 40.0 and over, adult: Secondary | ICD-10-CM

## 2021-07-28 DIAGNOSIS — I4891 Unspecified atrial fibrillation: Secondary | ICD-10-CM | POA: Diagnosis not present

## 2021-07-28 DIAGNOSIS — I214 Non-ST elevation (NSTEMI) myocardial infarction: Secondary | ICD-10-CM | POA: Diagnosis not present

## 2021-07-28 DIAGNOSIS — R32 Unspecified urinary incontinence: Secondary | ICD-10-CM | POA: Diagnosis present

## 2021-07-28 DIAGNOSIS — E785 Hyperlipidemia, unspecified: Secondary | ICD-10-CM | POA: Diagnosis present

## 2021-07-28 DIAGNOSIS — Z86711 Personal history of pulmonary embolism: Secondary | ICD-10-CM | POA: Diagnosis not present

## 2021-07-28 DIAGNOSIS — Z6834 Body mass index (BMI) 34.0-34.9, adult: Secondary | ICD-10-CM

## 2021-07-28 DIAGNOSIS — I110 Hypertensive heart disease with heart failure: Secondary | ICD-10-CM | POA: Diagnosis present

## 2021-07-28 DIAGNOSIS — I82532 Chronic embolism and thrombosis of left popliteal vein: Secondary | ICD-10-CM | POA: Diagnosis not present

## 2021-07-28 DIAGNOSIS — N179 Acute kidney failure, unspecified: Secondary | ICD-10-CM | POA: Diagnosis present

## 2021-07-28 DIAGNOSIS — R0789 Other chest pain: Secondary | ICD-10-CM | POA: Diagnosis present

## 2021-07-28 DIAGNOSIS — R2689 Other abnormalities of gait and mobility: Secondary | ICD-10-CM | POA: Diagnosis not present

## 2021-07-28 DIAGNOSIS — T460X1D Poisoning by cardiac-stimulant glycosides and drugs of similar action, accidental (unintentional), subsequent encounter: Secondary | ICD-10-CM | POA: Diagnosis not present

## 2021-07-28 DIAGNOSIS — R5381 Other malaise: Secondary | ICD-10-CM | POA: Diagnosis not present

## 2021-07-28 DIAGNOSIS — I455 Other specified heart block: Secondary | ICD-10-CM | POA: Diagnosis not present

## 2021-07-28 DIAGNOSIS — B309 Viral conjunctivitis, unspecified: Secondary | ICD-10-CM | POA: Diagnosis present

## 2021-07-28 DIAGNOSIS — I480 Paroxysmal atrial fibrillation: Secondary | ICD-10-CM | POA: Diagnosis not present

## 2021-07-28 DIAGNOSIS — I482 Chronic atrial fibrillation, unspecified: Secondary | ICD-10-CM | POA: Diagnosis not present

## 2021-07-28 DIAGNOSIS — R1312 Dysphagia, oropharyngeal phase: Secondary | ICD-10-CM | POA: Diagnosis not present

## 2021-07-28 DIAGNOSIS — I2782 Chronic pulmonary embolism: Secondary | ICD-10-CM

## 2021-07-28 DIAGNOSIS — Z7401 Bed confinement status: Secondary | ICD-10-CM | POA: Diagnosis not present

## 2021-07-28 DIAGNOSIS — I69398 Other sequelae of cerebral infarction: Secondary | ICD-10-CM | POA: Diagnosis not present

## 2021-07-28 DIAGNOSIS — M2550 Pain in unspecified joint: Secondary | ICD-10-CM | POA: Diagnosis not present

## 2021-07-28 DIAGNOSIS — I2609 Other pulmonary embolism with acute cor pulmonale: Secondary | ICD-10-CM

## 2021-07-28 DIAGNOSIS — J90 Pleural effusion, not elsewhere classified: Secondary | ICD-10-CM | POA: Diagnosis not present

## 2021-07-28 DIAGNOSIS — Z7901 Long term (current) use of anticoagulants: Secondary | ICD-10-CM

## 2021-07-28 DIAGNOSIS — E669 Obesity, unspecified: Secondary | ICD-10-CM | POA: Diagnosis present

## 2021-07-28 DIAGNOSIS — Z79899 Other long term (current) drug therapy: Secondary | ICD-10-CM

## 2021-07-28 DIAGNOSIS — R000 Tachycardia, unspecified: Secondary | ICD-10-CM | POA: Diagnosis not present

## 2021-07-28 DIAGNOSIS — Z8249 Family history of ischemic heart disease and other diseases of the circulatory system: Secondary | ICD-10-CM

## 2021-07-28 DIAGNOSIS — I69828 Other speech and language deficits following other cerebrovascular disease: Secondary | ICD-10-CM | POA: Diagnosis not present

## 2021-07-28 DIAGNOSIS — E8809 Other disorders of plasma-protein metabolism, not elsewhere classified: Secondary | ICD-10-CM | POA: Diagnosis not present

## 2021-07-28 DIAGNOSIS — I10 Essential (primary) hypertension: Secondary | ICD-10-CM | POA: Diagnosis not present

## 2021-07-28 DIAGNOSIS — I69891 Dysphagia following other cerebrovascular disease: Secondary | ICD-10-CM | POA: Diagnosis not present

## 2021-07-28 DIAGNOSIS — I2510 Atherosclerotic heart disease of native coronary artery without angina pectoris: Secondary | ICD-10-CM | POA: Diagnosis present

## 2021-07-28 DIAGNOSIS — T460X1A Poisoning by cardiac-stimulant glycosides and drugs of similar action, accidental (unintentional), initial encounter: Secondary | ICD-10-CM | POA: Diagnosis not present

## 2021-07-28 DIAGNOSIS — I493 Ventricular premature depolarization: Secondary | ICD-10-CM | POA: Diagnosis present

## 2021-07-28 DIAGNOSIS — R001 Bradycardia, unspecified: Secondary | ICD-10-CM | POA: Diagnosis not present

## 2021-07-28 DIAGNOSIS — T460X5A Adverse effect of cardiac-stimulant glycosides and drugs of similar action, initial encounter: Secondary | ICD-10-CM | POA: Diagnosis present

## 2021-07-28 DIAGNOSIS — G9341 Metabolic encephalopathy: Secondary | ICD-10-CM | POA: Diagnosis not present

## 2021-07-28 DIAGNOSIS — R627 Adult failure to thrive: Secondary | ICD-10-CM

## 2021-07-28 DIAGNOSIS — E876 Hypokalemia: Secondary | ICD-10-CM | POA: Diagnosis present

## 2021-07-28 DIAGNOSIS — Z86718 Personal history of other venous thrombosis and embolism: Secondary | ICD-10-CM | POA: Diagnosis not present

## 2021-07-28 DIAGNOSIS — M6281 Muscle weakness (generalized): Secondary | ICD-10-CM | POA: Diagnosis not present

## 2021-07-28 DIAGNOSIS — R2681 Unsteadiness on feet: Secondary | ICD-10-CM | POA: Diagnosis not present

## 2021-07-28 DIAGNOSIS — Z20822 Contact with and (suspected) exposure to covid-19: Secondary | ICD-10-CM | POA: Diagnosis not present

## 2021-07-28 DIAGNOSIS — I517 Cardiomegaly: Secondary | ICD-10-CM | POA: Diagnosis not present

## 2021-07-28 DIAGNOSIS — R9431 Abnormal electrocardiogram [ECG] [EKG]: Secondary | ICD-10-CM

## 2021-07-28 DIAGNOSIS — E119 Type 2 diabetes mellitus without complications: Secondary | ICD-10-CM | POA: Diagnosis not present

## 2021-07-28 DIAGNOSIS — R079 Chest pain, unspecified: Secondary | ICD-10-CM | POA: Diagnosis not present

## 2021-07-28 DIAGNOSIS — I2699 Other pulmonary embolism without acute cor pulmonale: Secondary | ICD-10-CM | POA: Diagnosis present

## 2021-07-28 DIAGNOSIS — I5032 Chronic diastolic (congestive) heart failure: Secondary | ICD-10-CM | POA: Diagnosis not present

## 2021-07-28 LAB — COMPREHENSIVE METABOLIC PANEL
ALT: 23 U/L (ref 0–44)
AST: 61 U/L — ABNORMAL HIGH (ref 15–41)
Albumin: 2.7 g/dL — ABNORMAL LOW (ref 3.5–5.0)
Alkaline Phosphatase: 68 U/L (ref 38–126)
Anion gap: 12 (ref 5–15)
BUN: 43 mg/dL — ABNORMAL HIGH (ref 8–23)
CO2: 34 mmol/L — ABNORMAL HIGH (ref 22–32)
Calcium: 8.8 mg/dL — ABNORMAL LOW (ref 8.9–10.3)
Chloride: 93 mmol/L — ABNORMAL LOW (ref 98–111)
Creatinine, Ser: 1.36 mg/dL — ABNORMAL HIGH (ref 0.44–1.00)
GFR, Estimated: 41 mL/min — ABNORMAL LOW (ref >60)
Glucose, Bld: 100 mg/dL — ABNORMAL HIGH (ref 70–99)
Potassium: 3.4 mmol/L — ABNORMAL LOW (ref 3.5–5.1)
Sodium: 139 mmol/L (ref 135–145)
Total Bilirubin: 1.3 mg/dL — ABNORMAL HIGH (ref 0.3–1.2)
Total Protein: 6.4 g/dL — ABNORMAL LOW (ref 6.5–8.1)

## 2021-07-28 LAB — TROPONIN I (HIGH SENSITIVITY)
Troponin I (High Sensitivity): 640 ng/L (ref <18)
Troponin I (High Sensitivity): 812 ng/L (ref <18)

## 2021-07-28 LAB — CBC
HCT: 45.1 % (ref 36.0–46.0)
Hemoglobin: 14.6 g/dL (ref 12.0–15.0)
MCH: 23.1 pg — ABNORMAL LOW (ref 26.0–34.0)
MCHC: 32.4 g/dL (ref 30.0–36.0)
MCV: 71.2 fL — ABNORMAL LOW (ref 80.0–100.0)
Platelets: 159 10*3/uL (ref 150–400)
RBC: 6.33 MIL/uL — ABNORMAL HIGH (ref 3.87–5.11)
RDW: 18.2 % — ABNORMAL HIGH (ref 11.5–15.5)
WBC: 9.4 10*3/uL (ref 4.0–10.5)
nRBC: 0 % (ref 0.0–0.2)

## 2021-07-28 LAB — RESP PANEL BY RT-PCR (FLU A&B, COVID) ARPGX2
Influenza A by PCR: NEGATIVE
Influenza B by PCR: NEGATIVE
SARS Coronavirus 2 by RT PCR: NEGATIVE

## 2021-07-28 LAB — BASIC METABOLIC PANEL
Anion gap: 12 (ref 5–15)
BUN: 45 mg/dL — ABNORMAL HIGH (ref 8–23)
CO2: 34 mmol/L — ABNORMAL HIGH (ref 22–32)
Calcium: 9.8 mg/dL (ref 8.9–10.3)
Chloride: 91 mmol/L — ABNORMAL LOW (ref 98–111)
Creatinine, Ser: 1.5 mg/dL — ABNORMAL HIGH (ref 0.44–1.00)
GFR, Estimated: 36 mL/min — ABNORMAL LOW (ref >60)
Glucose, Bld: 131 mg/dL — ABNORMAL HIGH (ref 70–99)
Potassium: 4.7 mmol/L (ref 3.5–5.1)
Sodium: 137 mmol/L (ref 135–145)

## 2021-07-28 LAB — GLUCOSE, CAPILLARY: Glucose-Capillary: 113 mg/dL — ABNORMAL HIGH (ref 70–99)

## 2021-07-28 LAB — BRAIN NATRIURETIC PEPTIDE: B Natriuretic Peptide: 837.6 pg/mL — ABNORMAL HIGH (ref 0.0–100.0)

## 2021-07-28 LAB — PHOSPHORUS: Phosphorus: 3.6 mg/dL (ref 2.5–4.6)

## 2021-07-28 LAB — MAGNESIUM: Magnesium: 1.9 mg/dL (ref 1.7–2.4)

## 2021-07-28 LAB — DIGOXIN LEVEL: Digoxin Level: 4.1 ng/mL (ref 0.8–2.0)

## 2021-07-28 LAB — CBG MONITORING, ED: Glucose-Capillary: 106 mg/dL — ABNORMAL HIGH (ref 70–99)

## 2021-07-28 LAB — CK: Total CK: 160 U/L (ref 38–234)

## 2021-07-28 MED ORDER — HYDROCODONE-ACETAMINOPHEN 5-325 MG PO TABS: 1.0000 | ORAL_TABLET | ORAL | Status: DC | PRN

## 2021-07-28 MED ORDER — SODIUM CHLORIDE 0.9 % IV SOLN: INTRAVENOUS | Status: DC | PRN

## 2021-07-28 MED ORDER — ROSUVASTATIN CALCIUM 5 MG PO TABS
5.0000 mg | ORAL_TABLET | Freq: Every day | ORAL | Status: DC
  Administered 2021-07-28 – 2021-08-02 (×6): 5 mg via ORAL
  Filled 2021-07-28 (×6): qty 1

## 2021-07-28 MED ORDER — SODIUM CHLORIDE 0.9% FLUSH
3.0000 mL | Freq: Two times a day (BID) | INTRAVENOUS | Status: DC
  Administered 2021-07-28 – 2021-07-29 (×2): 3 mL via INTRAVENOUS

## 2021-07-28 MED ORDER — DIGOXIN 125 MCG PO TABS: 0.2500 mg | ORAL_TABLET | Freq: Every day | ORAL | Status: DC

## 2021-07-28 MED ORDER — SODIUM CHLORIDE 0.9% FLUSH: 3.0000 mL | INTRAVENOUS | Status: DC | PRN

## 2021-07-28 MED ORDER — APIXABAN 5 MG PO TABS
5.0000 mg | ORAL_TABLET | Freq: Two times a day (BID) | ORAL | Status: DC
  Administered 2021-07-28: 5 mg via ORAL
  Filled 2021-07-28: qty 1

## 2021-07-28 MED ORDER — METOPROLOL TARTRATE 100 MG PO TABS
100.0000 mg | ORAL_TABLET | Freq: Two times a day (BID) | ORAL | Status: DC
  Administered 2021-07-28: 100 mg via ORAL
  Filled 2021-07-28: qty 1

## 2021-07-28 MED ORDER — AMIODARONE HCL 200 MG PO TABS
200.0000 mg | ORAL_TABLET | Freq: Every day | ORAL | Status: DC
  Administered 2021-07-28: 200 mg via ORAL
  Filled 2021-07-28: qty 1

## 2021-07-28 MED ORDER — SODIUM CHLORIDE 0.9 % IV SOLN: 250.0000 mL | INTRAVENOUS | Status: DC | PRN

## 2021-07-28 MED ORDER — ACETAMINOPHEN 650 MG RE SUPP: 650.0000 mg | Freq: Four times a day (QID) | RECTAL | Status: DC | PRN

## 2021-07-28 MED ORDER — PANTOPRAZOLE SODIUM 40 MG PO TBEC
40.0000 mg | DELAYED_RELEASE_TABLET | Freq: Every day | ORAL | Status: DC
  Administered 2021-07-28 – 2021-08-02 (×6): 40 mg via ORAL
  Filled 2021-07-28 (×6): qty 1

## 2021-07-28 MED ORDER — POTASSIUM CHLORIDE 10 MEQ/100ML IV SOLN
10.0000 meq | INTRAVENOUS | Status: AC
  Administered 2021-07-29 (×4): 10 meq via INTRAVENOUS
  Filled 2021-07-28 (×4): qty 100

## 2021-07-28 MED ORDER — ACETAMINOPHEN 325 MG PO TABS: 650.0000 mg | ORAL_TABLET | Freq: Four times a day (QID) | ORAL | Status: DC | PRN

## 2021-07-28 MED ORDER — ASPIRIN 325 MG PO TABS
325.0000 mg | ORAL_TABLET | Freq: Every day | ORAL | Status: DC
  Administered 2021-07-28 – 2021-07-29 (×2): 325 mg via ORAL
  Filled 2021-07-28 (×2): qty 1

## 2021-07-28 NOTE — Progress Notes (Signed)
Patient arrived from [place]. Notified [facility] admissions, response received as of 1941.

## 2021-07-28 NOTE — ED Triage Notes (Signed)
Patient presents to the ER for FTT. Patient's daughter reports since she was diagnosed with a blood clot and discharged from the hospital in October she has been declining. States she is not eating, not walking, and has had episodes of incontinence.

## 2021-07-28 NOTE — Telephone Encounter (Signed)
Received call from outpatient lab to notify office that BNP is elevated at 4074.  Attempted to call patient to notify of lab results, however she is presently being evaluated in [facility] emergency department.  Will plan to follow as needed, will defer present workup to ED providers at this time.

## 2021-07-28 NOTE — Progress Notes (Addendum)
[facility] Recent discharged from [facility] after afib/rvr/PE/PEA-requiring emergent intubation , presents with FTT, family reports patient had been declining after discharge, found to have elevated troponin, though no chest pain. cxr with pleural effusion but no hypoxia at rest, Also has aki,  EDP discussed with Cardiology Dr Excell Seltzer who recommend admit to hospitalist service, cardiology will consult, no need of heparin drip since currently no chest pain. Patient is on cards consult list to see.

## 2021-07-28 NOTE — ED Notes (Signed)
Pt transported to xray 

## 2021-07-28 NOTE — ED Notes (Signed)
PA notified of trop lab values

## 2021-07-28 NOTE — ED Notes (Signed)
Lab notified to add-on troponin to previously collected lab work.  

## 2021-07-28 NOTE — ED Notes (Signed)
[facility] notified of trop value

## 2021-07-28 NOTE — H&P (Addendum)
[address] [place] [id number] DOB: April 15, 1946 DOA: 07/28/2021     PCP: Ashley Pretty, MD   Outpatient Specialists:  CARDS:  Dr.Ganji    Patient arrived to ER on 07/28/21 at 1200 Referred by Attending Ashley Reasons, MD  Patient coming from: home Lives  With family    Chief Complaint:   Chief Complaint  Patient presents with   Failure To Thrive    HPI: Ashley [name] is a 75 y.o. female with medical history significant of DM-2, HTN, HLD,CVA, atrial tachycardia    Presented with   progressive weakness anorexia, foul taste in her mouth Debility. At first was able to participate with PT/OT but now unable  Has been deteriorating at home Last month was admitted with A. fib with RVR-subsequently evaluated by cardiology-underwent TEE  which was complicated by severe hypoxia/hypotension-intermittent PEA-requiring emergent intubation and transfer to the ICU.  TEE was limited but it did show severe RV dysfunction-subsequent CTA chest showed submassive PE-she was subsequently given systemic tPA. Pt was extubated on 10/4 and transferred to [facility] on 10/6 bilateral lower extremity Doppler: Acute DVT right common femoral vein, age-indeterminate DVT involving left posterior tibial/left peroneal vein. Regarding her A.FIB Cardiology following-remains on amiodarone/digoxin and metoprolol.  Anticoagulated with Eliquis.    not eating, not walking, and has had episodes of incontinence.    She denies fevers, urinary symptoms, abdominal pain outside of feeding, vomiting, headache, sick contacts, chest pain or shortness of breath.   Reports she been having some constipation took some laxative and had a large bowel movement  Reports have been taking all her medicaitons Has  been vaccinated against COVID and boosted   Initial COVID TEST  NEGATIVE   Lab Results  Component Value Date   SARSCOV2NAA NEGATIVE 07/28/2021   [place] NEGATIVE 06/19/2021   [place] NEGATIVE 04/04/2020    Regarding pertinent Chronic problems:     Hyperlipidemia -  on statins  Crestor Lipid Panel     Component Value Date/Time   CHOL 163 04/05/2020 0334   TRIG 95 06/22/2021 0302   HDL 63 04/05/2020 0334   CHOLHDL 2.6 04/05/2020 0334   VLDL 13 04/05/2020 0334   LDLCALC 87 04/05/2020 0334     HTN on metoprol  chronic CHF Systolic- last echo EF [id number]      DM 2 -  Lab Results  Component Value Date   HGBA1C 5.9 (H) 06/25/2021   diet controlled     obesity-   BMI Readings from Last 1 Encounters:  07/02/21 41.50 kg/m     A. Fib -  - CHA2DS2 vas score  7      current  on anticoagulation with  Eliquis          -  Rate control:  Currently controlled with  Metoprolol,         - Rhythm control:   amiodarone,     Hx of DVT/PE on - anticoagulation with  Eliquis,    Recent Labs    06/26/21 0500 06/27/21 0442 07/28/21 1230  HGB 11.5* 11.9* 14.6    While in ER: Mild AKI   ED Triage Vitals  Enc Vitals Group     BP 07/28/21 1217 113/73     Pulse Rate 07/28/21 1217 79     Resp 07/28/21 1217 20     Temp 07/28/21 1217 97.6 F (36.4 C)     Temp Source 07/28/21 1931 Oral     SpO2 07/28/21 1217 96 %  Weight --      Height --      Head Circumference --      Peak Flow --      Pain Score 07/28/21 1217 0     Pain Loc --      Pain Edu? --      Excl. in GC? --   TMAX(24)@     _________________________________________ Significant initial  Findings: Abnormal Labs Reviewed  BASIC METABOLIC PANEL - Abnormal; Notable for the following components:      Result Value   Chloride 91 (*)    CO2 34 (*)    Glucose, Bld 131 (*)    BUN 45 (*)    Creatinine, Ser 1.50 (*)    GFR, Estimated 36 (*)    All other components within normal limits  CBC - Abnormal; Notable for the following components:   RBC 6.33 (*)    MCV 71.2 (*)    MCH 23.1 (*)    RDW 18.2 (*)    All other components within normal limits  BRAIN NATRIURETIC PEPTIDE - Abnormal; Notable for the following components:    B Natriuretic Peptide 837.6 (*)    All other components within normal limits  CBG MONITORING, ED - Abnormal; Notable for the following components:   Glucose-Capillary 106 (*)    All other components within normal limits  TROPONIN I (HIGH SENSITIVITY) - Abnormal; Notable for the following components:   Troponin I (High Sensitivity) 640 (*)    All other components within normal limits  TROPONIN I (HIGH SENSITIVITY) - Abnormal; Notable for the following components:   Troponin I (High Sensitivity) 812 (*)    All other components within normal limits   ____________________________________________ Ordered     CXR - Cardiomegaly with bilateral effusions    _________________________ Troponin 640 - 812 ECG: Ordered Personally reviewed by me showing: HR : 88 Rhythm:  SR,  w PAC  nonspecific changes,   QTC 600 ___________  The recent clinical data is shown below. Vitals:   07/28/21 1447 07/28/21 1500 07/28/21 1530 07/28/21 1931  BP: 133/75 135/75 104/68 136/85  Pulse: 74 65  78  Resp: 19 19 20 18   Temp:    97.8 F (36.6 C)  TempSrc:    Oral  SpO2: 95% 91%  91%    WBC     Component Value Date/Time   WBC 9.4 07/28/2021 1230   LYMPHSABS 1.5 06/19/2021 1145   MONOABS 0.6 06/19/2021 1145   EOSABS 0.0 06/19/2021 1145   BASOSABS 0.0 06/19/2021 1145       UA ordered    Results for orders placed or performed during the hospital encounter of 07/28/21  Resp Panel by RT-PCR (Flu A&B, Covid) Nasopharyngeal Swab     Status: None   Collection Time: 07/28/21  8:19 PM   Specimen: Nasopharyngeal Swab; Nasopharyngeal(NP) swabs in vial transport medium  Result Value Ref Range Status   SARS Coronavirus 2 by RT PCR NEGATIVE NEGATIVE Final         Influenza A by PCR NEGATIVE NEGATIVE Final   Influenza B by PCR NEGATIVE NEGATIVE Final          _______________________________________________________ ER Provider Called:  Cardiology      They Recommend admit to medicine   Will see in AM     _______________________________________________ Hospitalist was called for admission for fatigue and AKI 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  The following Work up has been ordered so far:  Orders Placed This  Encounter  Procedures   DG Chest 2 View   Basic metabolic panel   CBC   Urinalysis, Routine w reflex microscopic   Brain natriuretic peptide   Document Height and Actual Weight   Cardiac monitoring   Height and weight   Consult to Transition of Care Team (SW and CM)   Inpatient consult to Cardiology  [id number]   Consult to hospitalist  [id number]   CBG monitoring, ED   ED EKG   EKG 12-Lead   Repeat EKG   EKG 12-Lead   EKG 12-Lead   Admit to Inpatient (patient's expected length of stay will be greater than 2 midnights or inpatient only procedure)      Following Medications were ordered in ER: Medications  aspirin tablet 325 mg (325 mg Oral Given 07/28/21 1629)        Consult Orders  (From admission, onward)           Start     Ordered   07/28/21 1520  Consult to hospitalist  [place] @ 1524 [id number]  Once       Comments: [id number]  Provider:  (Not yet assigned)  Question Answer Comment  Place call to: [facility]   Reason for Consult Admit      07/28/21 1520              OTHER Significant initial  Findings:  labs showing:    Recent Labs  Lab 07/28/21 1230  NA 137  K 4.7  CO2 34*  GLUCOSE 131*  BUN 45*  CREATININE 1.50*  CALCIUM 9.8    Cr  * stable,  Up from baseline see below Lab Results  Component Value Date   CREATININE 1.50 (H) 07/28/2021   CREATININE 0.95 06/27/2021   CREATININE 0.88 06/25/2021    No results for input(s): AST, ALT, ALKPHOS, BILITOT, PROT, ALBUMIN in the last 168 hours. Lab Results  Component Value Date   CALCIUM 9.8 07/28/2021   PHOS 2.9 04/06/2020          Plt: Lab Results  Component Value Date   PLT 159 07/28/2021       COVID-19 Labs  No results for input(s): DDIMER,  FERRITIN, LDH, CRP in the last 72 hours.  Lab Results  Component Value Date   SARSCOV2NAA NEGATIVE 06/19/2021   [place] NEGATIVE 04/04/2020      Recent Labs  Lab 07/28/21 1230  WBC 9.4  HGB 14.6  HCT 45.1  MCV 71.2*  PLT 159    HG/HCT  stable,       Component Value Date/Time   HGB 14.6 07/28/2021 1230   HCT 45.1 07/28/2021 1230   MCV 71.2 (L) 07/28/2021 1230      No results for input(s): LIPASE, AMYLASE in the last 168 hours. No results for input(s): AMMONIA in the last 168 hours.   Cardiac Panel (last 3 results) No results for input(s): CKTOTAL, CKMB, TROPONINI, RELINDX in the last 72 hours.   BNP (last 3 results) Recent Labs    06/26/21 0500 06/27/21 0441 07/28/21 1231  BNP 1,617.9* 1,914.8* 837.6*      DM  labs:  HbA1C: Recent Labs    06/25/21 0152  HGBA1C 5.9*       CBG (last 3)  Recent Labs    07/28/21 1833  GLUCAP 106*          Cultures:    Component Value Date/Time   SDES URINE, CLEAN CATCH 06/20/2021 0137   SPECREQUEST  06/20/2021 0137  NONE Performed at [facility], [place] [address] [place], [place] [address]    CULT MULTIPLE SPECIES PRESENT, SUGGEST RECOLLECTION (A) 06/20/2021 0137   REPTSTATUS 06/21/2021 FINAL 06/20/2021 0137     Radiological Exams on Admission: DG Chest 2 View  Result Date: 07/28/2021 CLINICAL DATA:  Shortness of breath EXAM: CHEST - 2 VIEW COMPARISON:  Radiographs dated June 21, 2021 the FINDINGS: The heart is markedly enlarged. There are bilateral pleural effusions, right greater than the left. Underlying airspace disease cannot be excluded. IMPRESSION: 1.  Marked cardiomegaly. 2. Bilateral pleural effusions, right greater than the left. Underlying airspace disease cannot be excluded. Electronically Signed   By: Keane Police D.O.   On: 07/28/2021 14:25   _______________________________________________________________________________________________________ Latest  Blood pressure 136/85, pulse  78, temperature 97.8 F (36.6 C), temperature source Oral, resp. rate 18, SpO2 91 %.   Review of Systems:    Pertinent positives include:  fatigue, weight loss  nausea, loss of appetite Constitutional:  No weight loss, night sweats, Fevers, chills,  HEENT:  No headaches, Difficulty swallowing,Tooth/dental problems,Sore throat,  No sneezing, itching, ear ache, nasal congestion, post nasal drip,  Cardio-vascular:  No chest pain, Orthopnea, PND, anasarca, dizziness, palpitations.no Bilateral lower extremity swelling  GI:  No heartburn, indigestion, abdominal pain,  vomiting, diarrhea, change in bowel habits,, melena, blood in stool, hematemesis Resp:  no shortness of breath at rest. No dyspnea on exertion, No excess mucus, no productive cough, No non-productive cough, No coughing up of blood.No change in color of mucus.No wheezing. Skin:  no rash or lesions. No jaundice GU:  no dysuria, change in color of urine, no urgency or frequency. No straining to urinate.  No flank pain.  Musculoskeletal:  No joint pain or no joint swelling. No decreased range of motion. No back pain.  Psych:  No change in mood or affect. No depression or anxiety. No memory loss.  Neuro: no localizing neurological complaints, no tingling, no weakness, no double vision, no gait abnormality, no slurred speech, no confusion  All systems reviewed and apart from [place] all are negative _______________________________________________________________________________________________ Past Medical History:   Past Medical History:  Diagnosis Date   Essential hypertension 11/23/2018   Obesity 11/23/2018   Stroke [facility])       Past Surgical History:  Procedure Laterality Date   CARDIOVERSION N/A 06/21/2021   Procedure: CARDIOVERSION;  Surgeon: Nigel Mormon, MD;  Location: [place];  Service: Cardiovascular;  Laterality: N/A;   TEE WITHOUT CARDIOVERSION N/A 06/21/2021   Procedure: TRANSESOPHAGEAL  ECHOCARDIOGRAM (TEE);  Surgeon: Nigel Mormon, MD;  Location: [facility] ENDOSCOPY;  Service: Cardiovascular;  Laterality: N/A;    Social History:  Ambulatory now unable to get around     reports that she has never smoked. She has never been exposed to tobacco smoke. She has never used smokeless tobacco. She reports that she does not drink alcohol and does not use drugs.     Family History:   Family History  Problem Relation Age of Onset   Stroke Mother    Hypertension Mother    ______________________________________________________________________________________________ Allergies: Allergies  Allergen Reactions   Bystolic [Nebivolol Hcl]     Bradycardia    Coreg [Carvedilol]     bradycardia   Minoxidil     Abdominal pain     Prior to Admission medications   Medication Sig Start Date End Date Taking? Authorizing Provider  amiodarone (PACERONE) 200 MG tablet Take 200 mg twice a day for 5 days then  ONCE a day 06/27/21  Yes Thurnell Lose, MD  apixaban (ELIQUIS) 5 MG TABS tablet Take 1 tablet (5 mg total) by mouth 2 (two) times daily. 06/30/21  Yes Thurnell Lose, MD  digoxin (LANOXIN) 0.25 MG tablet Take 1 tablet (0.25 mg total) by mouth daily. 06/29/21  Yes Cantwell, Celeste C, PA-C  famotidine (PEPCID) 20 MG tablet Take 1 tablet (20 mg total) by mouth daily. 06/27/21 06/27/22 Yes Thurnell Lose, MD  losartan (COZAAR) 25 MG tablet Take 0.5 tablets (12.5 mg total) by mouth daily. 06/27/21 08/26/21 Yes Thurnell Lose, MD  metoprolol tartrate (LOPRESSOR) 100 MG tablet Take 1 tablet (100 mg total) by mouth 2 (two) times daily. 06/27/21  Yes Thurnell Lose, MD  omeprazole (PRILOSEC) 20 MG capsule Take 20 mg by mouth daily.   Yes [provider]  spironolactone (ALDACTONE) 25 MG tablet Take 1 tablet (25 mg total) by mouth daily. 06/27/21  Yes Thurnell Lose, MD  torsemide (DEMADEX) 20 MG tablet Take 1 tablet (20 mg total) by mouth daily. 06/28/21  Yes Thurnell Lose, MD  acetaminophen (TYLENOL) 500 MG tablet Take 500 mg by mouth daily as needed for mild pain or headache.    [provider]  hydrochlorothiazide (MICROZIDE) 12.5 MG capsule TAKE 1 CAPSULE BY MOUTH EVERY DAY 07/19/21   Cantwell, Celeste C, PA-C  rosuvastatin (CRESTOR) 5 MG tablet Take 1 tablet (5 mg total) by mouth daily. 07/02/21   Cantwell, Gerline Legacy, PA-C    ___________________________________________________________________________________________________ Physical Exam: Vitals with BMI 07/28/2021 07/28/2021 07/28/2021  Height - - -  Weight - - -  BMI - - -  Systolic [id number]  Diastolic 85 68 75  Pulse 78 - 65     1. General:  in No  Acute distress   Chronically ill   -appearing 2. Psychological: Alert and   Oriented 3. Head/ENT:     Dry Mucous Membranes                          Head Non traumatic, neck supple                       Poor Dentition 4. SKIN:  decreased Skin turgor,  Skin clean Dry and intact no rash 5. Heart: Regular rate and rhythm  Murmur, no Rub or gallop 6. Lungs:  no wheezes or crackles   7. Abdomen: Soft,  non-tender, Non distended   obese  bowel sounds present 8. Lower extremities: no clubbing, cyanosis, 2+ edema 9. Neurologically Grossly intact, moving all 4 extremities equally  10. MSK: Normal range of motion    Chart has been reviewed  ______________________________________________________________________________________________  Assessment/Plan  75 y.o. female with medical history significant of DM-2, HTN, HLD,CVA, atrial tachycardia    Admitted for digoxin toxicity   Present on Admission:  FTT (failure to thrive) in adult likely secondary to digoxin toxicity versus debility after hospitalization PT OT evaluation   Digitalis toxicity -hold digoxin, correct hypokalemia, monitor magnesium and calcium levels Serial EKG, given new EKG changes will order Digibind Discussed at length with cardiology who is well aware of the  patient and will follow with [place] Repeat EKG read as a STEMI discussed at length with cardiology who feels that this is repull abnormalities rather than a true STEMI no chest pain or shortness of breath. Continue to monitor troponins Transfer to progressive care Frequent vital signs Patient  ready on Eliquis If develops any chest pain or anginal equivalents would need to notify cardiology immediately   QT prolongation -hold amiodarone, avoid QT prolonging medications   Acute metabolic encephalopathy -in the setting of digoxin toxicity. Mild AKI.   AKI (acute kidney injury) ([place]) mild AKI in the setting of digoxin toxicity poor p.o. intake.  Will gently diurese rehydrate   Elevated troponin discussed with cardiology who believes at this point is more of a demand ischemia.  Rather than true MI.  Appreciate cardiology input Patient on Eliquis chest pain-free   Essential hypertension -allow permissive hypertension for tonight   Pulmonary embolus (HCC) -  On Eliquis    Chronic systolic CHF (congestive heart failure) ([place]) -appears to be in AKI with decreased p.o. intake will hold diuretics for now   Anasarca -most likely secondary to low albumin secondary to  poor p.o. intake.  On nutritional consult check prealbumin  Hypokalemia will replace, and follow, frequent labs continue to stay on top of potassium replacement  History of paroxysmal atrial fibrillation hold amiodarone and Toprol for now continue Eliquis  DM2 -order sliding scale  Other plan as per orders.  DVT prophylaxis:   Eliquis   Code Status:    Code Status: Prior FULL CODE   as per patient  I had personally discussed CODE STATUS with patient     Family Communication:   Family not at  Bedside    Disposition Plan:     likely will need placement for rehabilitation                                                 Following barriers for discharge:                            Electrolytes corrected                                                        Will need to be able to tolerate PO                            Will likely need home health,                             Will need consultants to evaluate patient prior to discharge                        Would benefit from PT/OT eval prior to DC  Ordered                                      Nutrition    consulted                                     Consults called: Cardiology Dr. Terri Skains is aware of the patient have had discussions regarding care plan at  length  Admission status:  ED Disposition     ED Disposition  Admit   Condition  --   [place]: [place] [[id number]]  Level of Care: Telemetry Cardiac [103]  May [facility] patient to [facility] or [facility] if equivalent level of care is available:: Yes  Interfacility transfer: Yes  Covid Evaluation: Asymptomatic Screening Protocol (No Symptoms)  Diagnosis: FTT (failure to thrive) in adult [id number]  Admitting Physician: Ashley [name] [id number]  Attending Physician: Ashley [name] [id number]  Estimated length of stay: past midnight tomorrow  Certification:: I certify this patient will need inpatient services for at least 2 midnights           inpatient     I Expect 2 midnight stay secondary to severity of patient's current illness need for inpatient interventions justified by the following:    Severe lab/radiological/exam abnormalities including:    [place] toxic and extensive comorbidities including:    DM2    CHF  CAD     Chronic anticoagulation  That are currently affecting medical management.   I expect  patient to be hospitalized for 2 midnights requiring inpatient medical care.  Patient is at high risk for adverse outcome (such as loss of life or disability) if not treated.  Indication for inpatient stay as follows:  need for frequent lab chaecks  Need for  IV fluids,     Level of care     tele  indefinitely please discontinue once patient no longer  qualifies COVID-19 Labs   Lab Results  Component Value Date   [place] NEGATIVE 07/28/2021     Precautions: admitted as  Covid Negative       PPE: Used by the provider:   N95  eye Goggles,  Gloves     Critical care CRITICAL CARE Performed by: Kinzley Savell   Total critical care time: 55 minutes  Critical care time was exclusive of separately billable procedures and treating other patients.  Critical care was necessary to treat or prevent imminent or life-threatening deterioration.  Critical care was time spent personally by me on the following activities: development of treatment plan with patient and/or surrogate as well as nursing, discussions with consultants, evaluation of patient's response to treatment, examination of patient, obtaining history from patient or surrogate, ordering and performing treatments and interventions, ordering and review of laboratory studies, ordering and review of radiographic studies, pulse oximetry and re-evaluation of patient's condition.    Keinan Brouillet 07/29/2021, 3:28 AM    [facility]     after 2 AM please page floor coverage PA If 7AM-7PM, please contact the day team taking care of the patient using [email or web address]   Patient was evaluated in the context of the global COVID-19 pandemic, which necessitated consideration that the patient might be at risk for infection with the SARS-CoV-2 virus that causes COVID-19. Institutional protocols and algorithms that pertain to the evaluation of patients at risk for COVID-19 are in a state of rapid change based on information released by regulatory bodies including the CDC and federal and state organizations. These policies and algorithms were followed during the patient's care.

## 2021-07-28 NOTE — ED Provider Notes (Signed)
[facility] EMERGENCY DEPT Provider Note   CSN: [id number] Arrival date & time: 07/28/21  1200     History Chief Complaint  Patient presents with   Failure To Thrive    Ashley [name] is a 75 y.o. female.  Who presents to the emergency department with failure to thrive.  When talking with the patient she states that "everything is wrong."  She states she is tired.  She states that she has been food averse as of recently.  She has had intermittent nausea and smells of food.  She states that when she puts food in her mouth she sometimes gets nauseated.  She states that if she is eating it irritates her stomach.  She denies fevers, urinary symptoms, abdominal pain outside of feeding, vomiting, headache, sick contacts, chest pain or shortness of breath.  Niece, who brought her to hospital, is at the bedside.  She states that the patient was in the hospital at the beginning of October for "heart problems" and had a blood clot.  She states that since she has been discharged home she has been declining.  She states that she "cannot move around like she normally does."  She states that she has barely eating 1 meal a day, is having difficulty swallowing, has difficulty getting out of bed, needs assistance with walking, and is intermittently very lethargic.  Patient lives with her son.  Then he states that patient is reconsidering placement into a SNF.   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   Bradycardia 04/04/2020   Acute metabolic encephalopathy 04/04/2020   Elevated troponin  04/04/2020   Essential hypertension 11/23/2018   Controlled diabetes mellitus type II without complication (HCC) 11/23/2018   Obesity 11/23/2018    Past Surgical History:  Procedure Laterality Date   CARDIOVERSION N/A 06/21/2021   Procedure: CARDIOVERSION;  Surgeon: Elder Negus, MD;  Location: [facility] ENDOSCOPY;  Service: Cardiovascular;  Laterality: N/A;   TEE WITHOUT CARDIOVERSION N/A 06/21/2021   Procedure: TRANSESOPHAGEAL ECHOCARDIOGRAM (TEE);  Surgeon: Elder Negus, MD;  Location: [facility] ENDOSCOPY;  Service: Cardiovascular;  Laterality: N/A;     OB History     Gravida      Para      Term      Preterm      AB      Living  1      SAB      IAB      Ectopic      Multiple      Live Births              Family History  Problem Relation Age of Onset   Stroke Mother    Hypertension Mother     Social History   Tobacco Use   Smoking status: Never    Passive exposure: Never   Smokeless tobacco: Never  Vaping Use   Vaping Use: Never used  Substance Use Topics   Alcohol use: No   Drug use: No    Home Medications Prior to Admission medications   Medication Sig Start  Date End Date Taking? Authorizing Provider  acetaminophen (TYLENOL) 500 MG tablet Take 500 mg by mouth daily as needed for mild pain or headache.    [provider]  amiodarone (PACERONE) 200 MG tablet Take 200 mg twice a day for 5 days then ONCE a day 06/27/21   Thurnell Lose, MD  apixaban (ELIQUIS) 5 MG TABS tablet Take 1 tablet (5 mg total) by mouth 2 (two) times daily. 06/30/21   Thurnell Lose, MD  cetirizine (ZYRTEC) 10 MG tablet Take 10 mg by mouth daily as needed for allergies.    [provider]  Cholecalciferol (VITAMIN D3) 5000 units CAPS Take 1 capsule by mouth daily.    [provider]  digoxin (LANOXIN) 0.25 MG tablet Take 1 tablet (0.25 mg total) by mouth daily. 06/29/21   Cantwell, Celeste C, PA-C  famotidine (PEPCID) 20 MG tablet Take 1  tablet (20 mg total) by mouth daily. 06/27/21 06/27/22  Thurnell Lose, MD  hydrochlorothiazide (MICROZIDE) 12.5 MG capsule TAKE 1 CAPSULE BY MOUTH EVERY DAY 07/19/21   Cantwell, Celeste C, PA-C  losartan (COZAAR) 25 MG tablet Take 0.5 tablets (12.5 mg total) by mouth daily. 06/27/21 08/26/21  Thurnell Lose, MD  metoprolol tartrate (LOPRESSOR) 100 MG tablet Take 1 tablet (100 mg total) by mouth 2 (two) times daily. 06/27/21   Thurnell Lose, MD  rosuvastatin (CRESTOR) 5 MG tablet Take 1 tablet (5 mg total) by mouth daily. 07/02/21   Cantwell, Celeste C, PA-C  spironolactone (ALDACTONE) 25 MG tablet Take 1 tablet (25 mg total) by mouth daily. 06/27/21   Thurnell Lose, MD  torsemide (DEMADEX) 20 MG tablet Take 1 tablet (20 mg total) by mouth daily. 06/28/21   Thurnell Lose, MD    Allergies    Bystolic [nebivolol hcl], Coreg [carvedilol], and Minoxidil  Review of Systems   Review of Systems  Constitutional:  Positive for activity change, appetite change and fatigue.  HENT:  Positive for trouble swallowing.   Cardiovascular:  Negative for chest pain.  Gastrointestinal:  Positive for nausea.  Genitourinary:  Negative for dysuria.  Neurological:  Positive for weakness.  All other systems reviewed and are negative.  Physical Exam Updated Vital Signs BP 113/73 (BP Location: Left Arm)   Pulse 79   Temp 97.6 F (36.4 C)   Resp 20   SpO2 96%   Physical Exam Vitals and nursing note reviewed.  Constitutional:      Appearance: She is obese.  HENT:     Head: Normocephalic and atraumatic.     Nose: Nose normal.     Mouth/Throat:     Mouth: Mucous membranes are dry.  Eyes:     General: No scleral icterus.    Extraocular Movements: Extraocular movements intact.     Pupils: Pupils are equal, round, and reactive to light.  Cardiovascular:     Rate and Rhythm: Normal rate and regular rhythm.     Pulses: Normal pulses.     Heart sounds: Murmur heard.  Systolic murmur is present  with a grade of 2/6.  Pulmonary:     Effort: Pulmonary effort is normal. No respiratory distress.     Breath sounds: Normal breath sounds.  Abdominal:     General: Abdomen is protuberant. Bowel sounds are normal. There is no distension.     Palpations: Abdomen is soft.     Tenderness: There is generalized abdominal tenderness.  Musculoskeletal:        General:  No tenderness. Normal range of motion.     Cervical back: Normal range of motion and neck supple.     Right lower leg: 2+ Pitting Edema present.     Left lower leg: 2+ Pitting Edema present.  Skin:    General: Skin is warm and dry.     Capillary Refill: Capillary refill takes less than 2 seconds.  Neurological:     General: No focal deficit present.     Mental Status: She is alert and oriented to person, place, and time.     GCS: GCS eye subscore is 4. GCS verbal subscore is 5. GCS motor subscore is 6.  Psychiatric:        Attention and Perception: Attention normal.        Mood and Affect: Mood normal.        Behavior: Behavior is slowed. Behavior is cooperative.        Thought Content: Thought content normal.        Cognition and Memory: Cognition and memory normal.        Judgment: Judgment normal.    ED Results / Procedures / Treatments   Labs (all labs ordered are listed, but only abnormal results are displayed) Labs Reviewed  BASIC METABOLIC PANEL - Abnormal; Notable for the following components:      Result Value   Chloride 91 (*)    CO2 34 (*)    Glucose, Bld 131 (*)    BUN 45 (*)    Creatinine, Ser 1.50 (*)    GFR, Estimated 36 (*)    All other components within normal limits  CBC - Abnormal; Notable for the following components:   RBC 6.33 (*)    MCV 71.2 (*)    MCH 23.1 (*)    RDW 18.2 (*)    All other components within normal limits  BRAIN NATRIURETIC PEPTIDE - Abnormal; Notable for the following components:   B Natriuretic Peptide 837.6 (*)    All other components within normal limits  CBG  MONITORING, ED - Abnormal; Notable for the following components:   Glucose-Capillary 106 (*)    All other components within normal limits  TROPONIN I (HIGH SENSITIVITY) - Abnormal; Notable for the following components:   Troponin I (High Sensitivity) 640 (*)    All other components within normal limits  TROPONIN I (HIGH SENSITIVITY) - Abnormal; Notable for the following components:   Troponin I (High Sensitivity) 812 (*)    All other components within normal limits  URINALYSIS, ROUTINE W REFLEX MICROSCOPIC    EKG EKG Interpretation  Date/Time:  Wednesday July 28 2021 15:38:53 EST Ventricular Rate:  86 PR Interval:  186 QRS Duration: 115 QT Interval:  378 QTC Calculation: 453 R Axis:   49 Text Interpretation: Sinus rhythm Atrial premature complexes in couplets Nonspecific intraventricular conduction delay Probable anteroseptal infarct, recent Confirmed by Gareth Morgan [phone]) on 07/28/2021 3:52:32 PM  Radiology DG Chest 2 View  Result Date: 07/28/2021 CLINICAL DATA:  Shortness of breath EXAM: CHEST - 2 VIEW COMPARISON:  Radiographs dated June 21, 2021 the FINDINGS: The heart is markedly enlarged. There are bilateral pleural effusions, right greater than the left. Underlying airspace disease cannot be excluded. IMPRESSION: 1.  Marked cardiomegaly. 2. Bilateral pleural effusions, right greater than the left. Underlying airspace disease cannot be excluded. Electronically Signed   By: Keane Police D.O.   On: 07/28/2021 14:25    Procedures Procedures   Medications Ordered in ED Medications  aspirin tablet  325 mg (325 mg Oral Given 07/28/21 1629)    ED Course  I have reviewed the triage vital signs and the nursing notes.  Pertinent labs & imaging results that were available during my care of the patient were reviewed by me and considered in my medical decision making (see chart for details).  1516: Spoke with Dr. Burt Knack, with cardiology, who agrees to follow patient with  medicine admission. He does not recommend heparinization at this time. Will trend troponin.  1558: spoke with Dr. Erlinda Hong, hospitalist who agrees to admit patient to [facility].  MDM Rules/Calculators/A&P 75 year old female who presents emergency department with failure to thrive. Broad work-up initiated for nonspecific symptoms.  Notably her BNP is 837.6 -proved from previous of 1900. Initial troponin 640, repeat 812.  As noted above I spoke with Dr. Burt Knack with cardiology after the initial troponin.  He agrees to follow the patient with medical admission.  He does not recommend heparinization.  I have ordered and given her aspirin at this time.  She has not had any chest pain throughout her stay in the emergency department.  Repeat EKG after delta troponin without changes. EKG without ischemia or infarction, shows atrial fibrillation which is chronic. Chest x-ray with bilateral pleural effusions, consistent with congestive heart failure. Creatinine elevated 1.5, increased from 0.95.  Likely prerenal in nature.  Given the above findings consulted hospitalist and spoke with Dr. Erlinda Hong who agrees to admit the patient at this time. Final Clinical Impression(s) / ED Diagnoses Final diagnoses:  Failure to thrive in adult  Elevated troponin    Rx / DC Orders ED Discharge Orders     None        Mickie Hillier, PA-C 07/28/21 1849    Elnora Morrison, MD 08/02/21 2357

## 2021-07-28 NOTE — ED Notes (Addendum)
Placed on purewick

## 2021-07-29 ENCOUNTER — Encounter (HOSPITAL_COMMUNITY): Payer: Self-pay | Admitting: Cardiology

## 2021-07-29 ENCOUNTER — Inpatient Hospital Stay (HOSPITAL_COMMUNITY)
Admission: EM | Disposition: A | Discharge status: Home Health | Payer: Self-pay | Source: Home / Self Care | Attending: Family Medicine

## 2021-07-29 ENCOUNTER — Inpatient Hospital Stay: Payer: Medicare Other | Admitting: Pulmonary Disease

## 2021-07-29 DIAGNOSIS — R9431 Abnormal electrocardiogram [ECG] [EKG]: Secondary | ICD-10-CM | POA: Diagnosis present

## 2021-07-29 DIAGNOSIS — I82411 Acute embolism and thrombosis of right femoral vein: Secondary | ICD-10-CM

## 2021-07-29 DIAGNOSIS — I455 Other specified heart block: Secondary | ICD-10-CM

## 2021-07-29 DIAGNOSIS — T460X1A Poisoning by cardiac-stimulant glycosides and drugs of similar action, accidental (unintentional), initial encounter: Secondary | ICD-10-CM | POA: Diagnosis not present

## 2021-07-29 DIAGNOSIS — R601 Generalized edema: Secondary | ICD-10-CM | POA: Diagnosis present

## 2021-07-29 DIAGNOSIS — I214 Non-ST elevation (NSTEMI) myocardial infarction: Secondary | ICD-10-CM

## 2021-07-29 DIAGNOSIS — I5022 Chronic systolic (congestive) heart failure: Secondary | ICD-10-CM

## 2021-07-29 DIAGNOSIS — E43 Unspecified severe protein-calorie malnutrition: Secondary | ICD-10-CM | POA: Insufficient documentation

## 2021-07-29 HISTORY — PX: LEFT HEART CATH AND CORONARY ANGIOGRAPHY: CATH118249

## 2021-07-29 LAB — URINALYSIS, ROUTINE W REFLEX MICROSCOPIC
Bilirubin Urine: NEGATIVE
Glucose, UA: NEGATIVE mg/dL
Ketones, ur: 5 mg/dL — AB
Leukocytes,Ua: NEGATIVE
Nitrite: NEGATIVE
Protein, ur: NEGATIVE mg/dL
Specific Gravity, Urine: 1.018 (ref 1.005–1.030)
pH: 5 (ref 5.0–8.0)

## 2021-07-29 LAB — BASIC METABOLIC PANEL
Anion gap: 10 (ref 5–15)
Anion gap: 9 (ref 5–15)
BUN: 39 mg/dL — ABNORMAL HIGH (ref 8–23)
BUN: 39 mg/dL — ABNORMAL HIGH (ref 8–23)
CO2: 34 mmol/L — ABNORMAL HIGH (ref 22–32)
CO2: 34 mmol/L — ABNORMAL HIGH (ref 22–32)
Calcium: 8.7 mg/dL — ABNORMAL LOW (ref 8.9–10.3)
Calcium: 8.6 mg/dL — ABNORMAL LOW (ref 8.9–10.3)
Chloride: 95 mmol/L — ABNORMAL LOW (ref 98–111)
Chloride: 95 mmol/L — ABNORMAL LOW (ref 98–111)
Creatinine, Ser: 1.31 mg/dL — ABNORMAL HIGH (ref 0.44–1.00)
Creatinine, Ser: 1.18 mg/dL — ABNORMAL HIGH (ref 0.44–1.00)
GFR, Estimated: 42 mL/min — ABNORMAL LOW (ref >60)
GFR, Estimated: 48 mL/min — ABNORMAL LOW (ref >60)
Glucose, Bld: 112 mg/dL — ABNORMAL HIGH (ref 70–99)
Glucose, Bld: 105 mg/dL — ABNORMAL HIGH (ref 70–99)
Potassium: 3.3 mmol/L — ABNORMAL LOW (ref 3.5–5.1)
Potassium: 3.3 mmol/L — ABNORMAL LOW (ref 3.5–5.1)
Sodium: 139 mmol/L (ref 135–145)
Sodium: 138 mmol/L (ref 135–145)

## 2021-07-29 LAB — CBC WITH DIFFERENTIAL/PLATELET
Abs Immature Granulocytes: 0.06 10*3/uL (ref 0.00–0.07)
Basophils Absolute: 0 10*3/uL (ref 0.0–0.1)
Basophils Relative: 0 %
Eosinophils Absolute: 0 10*3/uL (ref 0.0–0.5)
Eosinophils Relative: 0 %
HCT: 40 % (ref 36.0–46.0)
Hemoglobin: 13.1 g/dL (ref 12.0–15.0)
Immature Granulocytes: 1 %
Lymphocytes Relative: 23 %
Lymphs Abs: 2 10*3/uL (ref 0.7–4.0)
MCH: 23.6 pg — ABNORMAL LOW (ref 26.0–34.0)
MCHC: 32.8 g/dL (ref 30.0–36.0)
MCV: 72.2 fL — ABNORMAL LOW (ref 80.0–100.0)
Monocytes Absolute: 0.9 10*3/uL (ref 0.1–1.0)
Monocytes Relative: 10 %
Neutro Abs: 6 10*3/uL (ref 1.7–7.7)
Neutrophils Relative %: 66 %
Platelets: 128 10*3/uL — ABNORMAL LOW (ref 150–400)
RBC: 5.54 MIL/uL — ABNORMAL HIGH (ref 3.87–5.11)
RDW: 17 % — ABNORMAL HIGH (ref 11.5–15.5)
WBC: 9 10*3/uL (ref 4.0–10.5)
nRBC: 0 % (ref 0.0–0.2)

## 2021-07-29 LAB — MAGNESIUM: Magnesium: 2 mg/dL (ref 1.7–2.4)

## 2021-07-29 LAB — HEMOGLOBIN A1C
Hgb A1c MFr Bld: 6.2 % — ABNORMAL HIGH (ref 4.8–5.6)
Mean Plasma Glucose: 131.24 mg/dL

## 2021-07-29 LAB — COMPREHENSIVE METABOLIC PANEL
ALT: 22 U/L (ref 0–44)
AST: 60 U/L — ABNORMAL HIGH (ref 15–41)
Albumin: 2.5 g/dL — ABNORMAL LOW (ref 3.5–5.0)
Alkaline Phosphatase: 67 U/L (ref 38–126)
Anion gap: 8 (ref 5–15)
BUN: 43 mg/dL — ABNORMAL HIGH (ref 8–23)
CO2: 34 mmol/L — ABNORMAL HIGH (ref 22–32)
Calcium: 8.7 mg/dL — ABNORMAL LOW (ref 8.9–10.3)
Chloride: 95 mmol/L — ABNORMAL LOW (ref 98–111)
Creatinine, Ser: 1.3 mg/dL — ABNORMAL HIGH (ref 0.44–1.00)
GFR, Estimated: 43 mL/min — ABNORMAL LOW (ref >60)
Glucose, Bld: 110 mg/dL — ABNORMAL HIGH (ref 70–99)
Potassium: 3.6 mmol/L (ref 3.5–5.1)
Sodium: 137 mmol/L (ref 135–145)
Total Bilirubin: 0.9 mg/dL (ref 0.3–1.2)
Total Protein: 6.2 g/dL — ABNORMAL LOW (ref 6.5–8.1)

## 2021-07-29 LAB — PREALBUMIN: Prealbumin: 15.1 mg/dL — ABNORMAL LOW (ref 18–38)

## 2021-07-29 LAB — DIGOXIN LEVEL
Digoxin Level: 0.6 ng/mL — ABNORMAL LOW (ref 0.8–2.0)
Digoxin Level: 5.8 ng/mL (ref 0.8–2.0)
Digoxin Level: 3.8 ng/mL (ref 0.8–2.0)

## 2021-07-29 LAB — TROPONIN I (HIGH SENSITIVITY)
Troponin I (High Sensitivity): 843 ng/L (ref <18)
Troponin I (High Sensitivity): 853 ng/L (ref <18)

## 2021-07-29 LAB — GLUCOSE, CAPILLARY
Glucose-Capillary: 114 mg/dL — ABNORMAL HIGH (ref 70–99)
Glucose-Capillary: 103 mg/dL — ABNORMAL HIGH (ref 70–99)
Glucose-Capillary: 108 mg/dL — ABNORMAL HIGH (ref 70–99)
Glucose-Capillary: 117 mg/dL — ABNORMAL HIGH (ref 70–99)
Glucose-Capillary: 96 mg/dL (ref 70–99)
Glucose-Capillary: 107 mg/dL — ABNORMAL HIGH (ref 70–99)

## 2021-07-29 LAB — APTT: aPTT: 29 seconds (ref 24–36)

## 2021-07-29 LAB — CREATININE, URINE, RANDOM: Creatinine, Urine: 180.16 mg/dL

## 2021-07-29 LAB — TSH: TSH: 1.511 u[IU]/mL (ref 0.350–4.500)

## 2021-07-29 LAB — HEPARIN LEVEL (UNFRACTIONATED): Heparin Unfractionated: >1.1 IU/mL — ABNORMAL HIGH (ref 0.30–0.70)

## 2021-07-29 LAB — PHOSPHORUS: Phosphorus: 3.3 mg/dL (ref 2.5–4.6)

## 2021-07-29 LAB — SODIUM, URINE, RANDOM: Sodium, Ur: <10 mmol/L

## 2021-07-29 SURGERY — LEFT HEART CATH AND CORONARY ANGIOGRAPHY
Anesthesia: LOCAL

## 2021-07-29 MED ORDER — SODIUM CHLORIDE 0.9 % IV BOLUS
500.0000 mL | Freq: Once | INTRAVENOUS | Status: AC
  Administered 2021-07-29: 500 mL via INTRAVENOUS

## 2021-07-29 MED ORDER — HEPARIN SODIUM (PORCINE) 1000 UNIT/ML IJ SOLN
INTRAMUSCULAR | Status: AC
  Filled 2021-07-29: qty 1

## 2021-07-29 MED ORDER — IOHEXOL 350 MG/ML SOLN
INTRAVENOUS | Status: DC | PRN
  Administered 2021-07-29: 75 mL

## 2021-07-29 MED ORDER — SODIUM CHLORIDE 0.9% FLUSH
3.0000 mL | Freq: Two times a day (BID) | INTRAVENOUS | Status: DC
  Administered 2021-07-29: 3 mL via INTRAVENOUS

## 2021-07-29 MED ORDER — VERAPAMIL HCL 2.5 MG/ML IV SOLN
INTRAVENOUS | Status: DC | PRN
  Administered 2021-07-29: 10 mL via INTRA_ARTERIAL

## 2021-07-29 MED ORDER — INSULIN ASPART 100 UNIT/ML IJ SOLN
0.0000 [IU] | INTRAMUSCULAR | Status: DC
  Administered 2021-07-30 – 2021-08-02 (×6): 1 [IU] via SUBCUTANEOUS

## 2021-07-29 MED ORDER — SODIUM CHLORIDE 0.9 % IV SOLN
4.0000 | Freq: Once | INTRAVENOUS | Status: AC
  Administered 2021-07-29: 4 via INTRAVENOUS
  Filled 2021-07-29: qty 160

## 2021-07-29 MED ORDER — HEPARIN SODIUM (PORCINE) 1000 UNIT/ML IJ SOLN
INTRAMUSCULAR | Status: DC | PRN
  Administered 2021-07-29: 5000 [IU] via INTRAVENOUS

## 2021-07-29 MED ORDER — POTASSIUM CHLORIDE CRYS ER 20 MEQ PO TBCR
40.0000 meq | EXTENDED_RELEASE_TABLET | Freq: Once | ORAL | Status: AC
  Administered 2021-07-29: 40 meq via ORAL
  Filled 2021-07-29: qty 2

## 2021-07-29 MED ORDER — HEPARIN (PORCINE) 25000 UT/250ML-% IV SOLN
1000.0000 [IU]/h | INTRAVENOUS | Status: DC
Start: 2021-07-29 — End: 2021-07-29
  Administered 2021-07-29: 1000 [IU]/h via INTRAVENOUS
  Filled 2021-07-29: qty 250

## 2021-07-29 MED ORDER — LABETALOL HCL 5 MG/ML IV SOLN: 10.0000 mg | INTRAVENOUS | Status: AC | PRN

## 2021-07-29 MED ORDER — ADULT MULTIVITAMIN W/MINERALS CH
1.0000 | ORAL_TABLET | Freq: Every day | ORAL | Status: DC
  Administered 2021-07-30 – 2021-08-02 (×4): 1 via ORAL
  Filled 2021-07-29 (×4): qty 1

## 2021-07-29 MED ORDER — SODIUM CHLORIDE 0.9% FLUSH: 3.0000 mL | INTRAVENOUS | Status: DC | PRN

## 2021-07-29 MED ORDER — LIDOCAINE HCL (PF) 1 % IJ SOLN
INTRAMUSCULAR | Status: AC
  Filled 2021-07-29: qty 30

## 2021-07-29 MED ORDER — LIDOCAINE HCL (PF) 1 % IJ SOLN
INTRAMUSCULAR | Status: DC | PRN
  Administered 2021-07-29: 2 mL

## 2021-07-29 MED ORDER — APIXABAN 5 MG PO TABS
5.0000 mg | ORAL_TABLET | Freq: Two times a day (BID) | ORAL | Status: DC
  Administered 2021-07-29 – 2021-08-02 (×8): 5 mg via ORAL
  Filled 2021-07-29 (×9): qty 1

## 2021-07-29 MED ORDER — HYDRALAZINE HCL 20 MG/ML IJ SOLN: 10.0000 mg | INTRAMUSCULAR | Status: AC | PRN

## 2021-07-29 MED ORDER — SODIUM CHLORIDE 0.9 % IV SOLN: INTRAVENOUS | Status: AC

## 2021-07-29 MED ORDER — HEPARIN (PORCINE) IN NACL 1000-0.9 UT/500ML-% IV SOLN
INTRAVENOUS | Status: AC
  Filled 2021-07-29: qty 1000

## 2021-07-29 MED ORDER — ACETAMINOPHEN 325 MG PO TABS
650.0000 mg | ORAL_TABLET | ORAL | Status: DC | PRN
  Filled 2021-07-29: qty 2

## 2021-07-29 MED ORDER — SODIUM CHLORIDE 0.9% FLUSH
3.0000 mL | Freq: Two times a day (BID) | INTRAVENOUS | Status: DC
  Administered 2021-07-29 – 2021-08-02 (×7): 3 mL via INTRAVENOUS

## 2021-07-29 MED ORDER — HEPARIN (PORCINE) IN NACL 1000-0.9 UT/500ML-% IV SOLN
INTRAVENOUS | Status: DC | PRN
  Administered 2021-07-29 (×2): 500 mL

## 2021-07-29 MED ORDER — SODIUM CHLORIDE 0.9 % IV SOLN: INTRAVENOUS | Status: DC

## 2021-07-29 MED ORDER — VERAPAMIL HCL 2.5 MG/ML IV SOLN
INTRAVENOUS | Status: AC
  Filled 2021-07-29: qty 2

## 2021-07-29 MED ORDER — POTASSIUM CHLORIDE 10 MEQ/100ML IV SOLN
10.0000 meq | Freq: Once | INTRAVENOUS | Status: AC
  Administered 2021-07-29: 10 meq via INTRAVENOUS
  Filled 2021-07-29: qty 100

## 2021-07-29 MED ORDER — SODIUM CHLORIDE 0.9 % IV SOLN: 250.0000 mL | INTRAVENOUS | Status: DC | PRN

## 2021-07-29 MED ORDER — ENSURE ENLIVE PO LIQD
237.0000 mL | Freq: Three times a day (TID) | ORAL | Status: DC
  Administered 2021-07-30 – 2021-08-02 (×8): 237 mL via ORAL

## 2021-07-29 SURGICAL SUPPLY — 9 items

## 2021-07-29 NOTE — Consult Note (Addendum)
CARDIOLOGY CONSULT NOTE  Patient ID: Ashley [name] MRN: [id number] DOB/AGE: 06/03/1946 75 y.o.  Admit date: 07/28/2021 Attending physician: Terrilee Croak, MD Primary Physician:  Deland Pretty, MD Outpatient Cardiologist: Dr. Adrian Prows, Lawerance Cruel, PA Inpatient Cardiologist: Rex Kras, DO, [facility]  Reason of consultation: Elevated troponins and digoxin level Referring physician: Dr. Toy Baker  Chief complaint: Fatigue/weakness  HPI:  Ashley [name] is a 75 y.o. African-American female who presents with a chief complaint of " tired and fatigue." Her past medical history and cardiovascular risk factors include: Diabetes mellitus type 2, hypertension, hyperlipidemia, history of CVA, pulmonary embolism, DVT, paroxysmal atrial fibrillation.  Patient was recently hospitalized from 06/19/2021 through 06/27/2021 during that visit she was diagnosed with atrial fibrillation with rapid ventricular rate and was scheduled to undergo TEE cardioversion.  However, during the TEE she was noted to be hypotensive and intermittently had a PEA arrest requiring intubation and transfer to ICU.  TEE noted dilated right ventricle with reduced systolic function and therefore CT PE protocol was performed which noted submassive PE and she underwent systemic tPA.  Patient was stabilized during the hospitalization and later discharged home.  After being home patient states that she has had a decline with regards to not wanting to consume fluids or eat her daily meals.  She felt tired, fatigued ED for further evaluation and management.  Cardiology was consulted during his hospitalization as base blood work noted elevated troponins and digoxin levels were checked which noted elevated dig levels at 4 ng/mL.  Initial plan was to do supportive care; however, due to her underlying acute kidney injury, hypokalemia, EKG findings junctional rhythm, accelerated idioventricular rhythm, sinus rhythm with  competing junctional, hypotension and vital signs.  The shared decision was to administer digiFab.  Since then her bradycardia arrhythmias have improved and on telemetry she was noted to have junctional bradycardia around 3 AM and shortly thereafter converted to sinus rhythm around 4:19 AM.  While she was asleep she was noted to have a sinus pause of 3.05 seconds at 4:36 AM per telemetry.  Currently patient is asymptomatic without any symptoms of bradycardia and no anginal discomfort.  Telemetry notes sinus bradycardia with rare PVCs and compensatory pauses. These pauses are 2 seconds in duration majority of time.  Patient recently finished her potassium replacements earlier this morning.  And her last dose of metoprolol 100 mg p.o. x1 and amiodarone for yesterday night per MAR.  ALLERGIES: Allergies  Allergen Reactions   Bystolic [Nebivolol Hcl]     Bradycardia    Coreg [Carvedilol]     bradycardia   Minoxidil     Abdominal pain    PAST MEDICAL HISTORY: Past Medical History:  Diagnosis Date   Essential hypertension 11/23/2018   Obesity 11/23/2018   Stroke ([place])     PAST SURGICAL HISTORY: Past Surgical History:  Procedure Laterality Date   CARDIOVERSION N/A 06/21/2021   Procedure: CARDIOVERSION;  Surgeon: Nigel Mormon, MD;  Location: [facility];  Service: Cardiovascular;  Laterality: N/A;   TEE WITHOUT CARDIOVERSION N/A 06/21/2021   Procedure: TRANSESOPHAGEAL ECHOCARDIOGRAM (TEE);  Surgeon: Nigel Mormon, MD;  Location: [facility];  Service: Cardiovascular;  Laterality: N/A;    FAMILY HISTORY: The patient's family history includes Hypertension in her mother; Stroke in her mother.   SOCIAL HISTORY:  The patient  reports that she has never smoked. She has never been exposed to tobacco smoke. She has never used smokeless tobacco. She reports that she does not drink alcohol  and does not use drugs.  MEDICATIONS: Current Outpatient Medications  Medication  Instructions   acetaminophen (TYLENOL) 500 mg, Oral, Daily PRN   amiodarone (PACERONE) 200 MG tablet Take 200 mg twice a day for 5 days then ONCE a day   apixaban (ELIQUIS) 5 mg, Oral, 2 times daily   digoxin (LANOXIN) 0.25 mg, Oral, Daily   famotidine (PEPCID) 20 mg, Oral, Daily   hydrochlorothiazide (MICROZIDE) 12.5 MG capsule TAKE 1 CAPSULE BY MOUTH EVERY DAY   losartan (COZAAR) 12.5 mg, Oral, Daily   metoprolol tartrate (LOPRESSOR) 100 mg, Oral, 2 times daily   omeprazole (PRILOSEC) 20 mg, Oral, Daily   rosuvastatin (CRESTOR) 5 mg, Oral, Daily   spironolactone (ALDACTONE) 25 mg, Oral, Daily   torsemide (DEMADEX) 20 mg, Oral, Daily    REVIEW OF SYSTEMS: Review of Systems  Constitutional: Positive for decreased appetite and malaise/fatigue. Negative for chills and fever.  HENT:  Negative for hoarse voice and nosebleeds.   Eyes:  Negative for discharge, double vision and pain.  Cardiovascular:  Negative for chest pain, claudication, dyspnea on exertion, leg swelling, near-syncope, orthopnea, palpitations, paroxysmal nocturnal dyspnea and syncope.  Respiratory:  Negative for hemoptysis and shortness of breath.   Musculoskeletal:  Negative for muscle cramps and myalgias.  Gastrointestinal:  Negative for abdominal pain, constipation, diarrhea, hematemesis, hematochezia, melena, nausea and vomiting.  Neurological:  Negative for dizziness and light-headedness.   PHYSICAL EXAM: Vitals with BMI 07/29/2021 07/29/2021 07/29/2021  Height - - -  Weight - - -  BMI - - -  Systolic [id number]  Diastolic 63 55 71  Pulse - - -     Intake/Output Summary (Last 24 hours) at 07/29/2021 0920 Last data filed at 07/28/2021 2216 Gross per 24 hour  Intake 3 ml  Output --  Net 3 ml    Net IO Since Admission: 3 mL [07/29/21 0920]  CONSTITUTIONAL: Appears older than stated age, hemodynamically stable, No acute distress.  SKIN: Skin is warm and dry. No rash noted. No cyanosis. No pallor. No  jaundice HEAD: Normocephalic and atraumatic.  EYES: No scleral icterus MOUTH/THROAT: Dry oral membranes.  Poor oral dentition. NECK: No JVD present. No thyromegaly noted. No carotid bruits  LYMPHATIC: No visible cervical adenopathy.  CHEST Normal respiratory effort. No intercostal retractions  LUNGS: Poor air exchange with reduced breath sounds bilaterally. No stridor. No wheezes. No rales.  CARDIOVASCULAR: Bradycardic, regular, S1-S2, no murmurs rubs or gallops appreciated ABDOMINAL: Obese, soft, nontender, nondistended, positive bowel sounds in all 4 quadrants, no apparent ascites.  EXTREMITIES: Trace bilateral pitting edema, warm to touch.  HEMATOLOGIC: No significant bruising NEUROLOGIC: Oriented to person, place, and time. Nonfocal. Normal muscle tone.  PSYCHIATRIC: Normal mood and affect. Normal behavior. Cooperative  RADIOLOGY: DG Chest 2 View  Result Date: 07/28/2021 CLINICAL DATA:  Shortness of breath EXAM: CHEST - 2 VIEW COMPARISON:  Radiographs dated June 21, 2021 the FINDINGS: The heart is markedly enlarged. There are bilateral pleural effusions, right greater than the left. Underlying airspace disease cannot be excluded. IMPRESSION: 1.  Marked cardiomegaly. 2. Bilateral pleural effusions, right greater than the left. Underlying airspace disease cannot be excluded. Electronically Signed   By: Keane Police D.O.   On: 07/28/2021 14:25    LABORATORY DATA: Lab Results  Component Value Date   WBC 9.0 07/29/2021   HGB 13.1 07/29/2021   HCT 40.0 07/29/2021   MCV 72.2 (L) 07/29/2021   PLT 128 (L) 07/29/2021    Recent Labs  Lab  07/29/21 0337  NA 137  K 3.6  CL 95*  CO2 34*  BUN 43*  CREATININE 1.30*  CALCIUM 8.7*  PROT 6.2*  BILITOT 0.9  ALKPHOS 67  ALT 22  AST 60*  GLUCOSE 110*    Lipid Panel  Lab Results  Component Value Date   CHOL 163 04/05/2020   HDL 63 04/05/2020   LDLCALC 87 04/05/2020   TRIG 95 06/22/2021   CHOLHDL 2.6 04/05/2020    BNP (last 3  results) Recent Labs    06/26/21 0500 06/27/21 0441 07/28/21 1231  BNP 1,617.9* 1,914.8* 837.6*    HEMOGLOBIN A1C Lab Results  Component Value Date   HGBA1C 6.2 (H) 07/29/2021   MPG 131.24 07/29/2021    Cardiac Panel (last 3 results) Recent Labs    07/28/21 1545 07/28/21 2140 07/29/21 0337 07/29/21 0520  CKTOTAL  --  160  --   --   TROPONINIHS 812*  --  843* 853*     TSH Recent Labs    06/19/21 1229 07/29/21 0337  TSH 1.412 1.511     CARDIAC DATABASE: EKG: 07/28/2021 accelerated junctional rhythm, 75 bpm.  07/28/2021 sinus rhythm with PACs, 86 bpm IVCD  07/28/2021: NSR, 82 bpm, nonspecific IVCD  07/28/2021: Normal sinus rhythm, 82 bpm, IVCD  07/29/2021: Sinus rhythm, 53 bpm, with competing junctional, ST depressions in the high lateral leads consider ischemia, minimal ST elevations in the precordial leads V1 V2.  07/29/2021: Accelerated idioventricular rhythm, 58 bpm, nonspecific IVCD, ST depressions in the high lateral leads consider ischemia.  07/29/2021: Sinus bradycardia, 47 bpm with sinus arrhythmia, ST depressions in the high lateral leads suggestive of possible ischemia.  Echocardiogram: 06/22/2021:  1. Left ventricular ejection fraction, by estimation, is 40 to 45%. The  left ventricle has mildly decreased function. LVEF is likely  underestimated due to interentricular septal flatterning in systole and  diastole s/o RV pressure and volume overload.  There is moderate left ventricular hypertrophy. Left ventricular diastolic  function could not be evaluated. There is the interventricular septum is  flattened in systole and diastole, consistent with right ventricular  pressure and volume overload.   2. Right ventricular systolic function is moderately reduced. The right  ventricular size is mildly enlarged. There is mildly elevated pulmonary  artery systolic pressure.   3. Left atrial size was severely dilated.   4. Right atrial size was severely  dilated.   5. A small pericardial effusion is present. There is no evidence of  cardiac tamponade.   6. The mitral valve is grossly normal. Moderate to severe mitral valve  regurgitation.   7. Tricuspid valve regurgitation is severe.   8. The aortic valve is tricuspid. Aortic valve regurgitation is not  visualized.   9. Above abnormalities are new compared to previous TTE in 2021.   Lower extremity venous duplex: 06/23/2021 RIGHT:  - Findings consistent with acute deep vein thrombosis involving the right common femoral vein.  - No cystic structure found in the popliteal fossa.     LEFT:  - Findings consistent with age indeterminate deep vein thrombosis involving the left posterior tibial veins, and left peroneal veins.  - No cystic structure found in the popliteal fossa.   IMPRESSION & RECOMMENDATIONS: Ashley [name] is a 75 y.o. African-American female whose past medical history and cardiovascular risk factors include: Diabetes mellitus type 2, hypertension, hyperlipidemia, history of CVA, pulmonary embolism, DVT, paroxysmal atrial fibrillation.  Impression:  Non-STEMI.  Bradycardia: Multifactorial Digoxin toxicity most likely secondary to   underlying AKI Sinus pause Failure to thrive. Acute kidney injury secondary to intravascular depletion Atrial fibrillation, suspect paroxysmal Benign essential hypertension. Chronic HFrEF, stage B, NYHA class II Recent history of DVT and pulmonary embolism History of CVA Long-term oral anticoagulation  Plan:  Since her recent discharge from the hospitalization it appears the patient has had decreased oral intake which is led to acute kidney injury, dehydration, and failure to thrive.  In the clinical setting patient's digoxin level became supratherapeutic leading to dig toxicity with evidence of bradycardia arrhythmia in the setting of acute kidney injury and hypokalemia.  Due to hypotension, dynamic EKG changes, patient was given  DigiFab.  Since then she has converted to normal sinus rhythm but has frequent ventricular ectopy leading to compensatory pauses.  Based on telemetry the longest pause was at 4:36 AM 3.05 seconds patient states that she was possibly asleep and asymptomatic per nursing staff.  Patient needs close monitoring with telemetry.  Hold metoprolol 100 mg p.o. twice daily and amiodarone for now.  Replace electrolytes aggressively with a potassium of 4 magnesium of 2.  Repeat BMP is pending after potassium replacement. We will place pacer pads prophylactically if needed.  Monitor for now. Recommend addressing reversible causes and rule out underlying ischemia; however, despite this if she continues to have pauses may consider EP evaluation.   Given her elevated troponins, dynamic EKG changes with ST depressions in the high lateral leads, and reduced LVEF we discussed ischemic work-up prior to discharge.  In the clinical setting, if her renal function improves would recommend left heart catheterization with possible intervention tomorrow.  Discussed the risks, benefits, alternatives with the patient and she is agreeable.  N.p.o. after midnight.  We will transition her from Eliquis to IV heparin.  Continue your care with regards to her other chronic comorbid conditions and active problem list.  We will follow the patient with you.  Plan of care was discussed with nursing staff during morning rounds.   CRITICAL CARE Performed by: Rex Kras   Total critical care time: 47 minutes   Critical care time was exclusive of separately billable procedures and treating other patients.   Critical care was necessary to treat or prevent imminent or life-threatening deterioration.   Critical care was time spent personally by me on the following activities: development of treatment plan with patient and/or surrogate as well as nursing, discussions with consultants, evaluation of patient's response to treatment, examination of  patient, obtaining history from patient or surrogate, ordering and performing treatments and interventions, ordering and review of laboratory studies, ordering and review of radiographic studies, pulse oximetry and re-evaluation of patient's condition.  This note was created using a voice recognition software as a result there may be grammatical errors inadvertently enclosed that do not reflect the nature of this encounter. Every attempt is made to correct such errors.  Mechele Claude [facility]  Pager: [phone] Office: [phone] 07/29/2021, 9:20 AM  ADDENDUM: Course of events discussed with the patient is signed over the phone  The procedure of left heart catheterization with possible intervention was explained to the patient in detail.  The indication, alternatives, risks and benefits were reviewed.  Complications include but not limited to bleeding, infection, vascular injury, stroke, myocardial infection, arrhythmia, kidney injury needing short-term or long-term dialysis, radiation-related injury in the case of prolonged fluoroscopy use, emergency cardiac surgery, and death. The patient understands the risks of serious complication is 1-2 in [id number] with diagnostic cardiac cath and 1-2% or less with  angioplasty/stenting.  The patient and her son [name] voices understanding and provides verbal feedback and wishes to proceed with coronary angiography with possible PCI.  Delilah Shan [facility]  Pager: [phone] Office: [phone] 07/29/21 12:39 PM

## 2021-07-29 NOTE — Progress Notes (Signed)
Cardiology On Call:  She was seen by our service during her prior hospitalizations.  Called today to for elevated troponins and elevated dig levels.  Patient evaluated by primary team and not complaining of chest pain but present to ED for failure to thrive. Her BNP is elevated but less compared to prior levels.   Paitent states that she has been feeling tired, fatigue, poor oral intake, and weakness.  Dig level were noted to be 4ng/mL along w/ hypokalemia at 3.4 in the setting of AKI and poor nutritional status albumin 2.7. VS have remained stable.   Recommend gentle fluid challenge w/ 0.9 NS w/ potassium supplement and recheck BMP and Dig levels in morning. If she has bradycardia, hypotension, or change in clinical status DigiBind is appropriate.  Was called at 1238am on 07/29/2021 as the ECG read acute MI. EKG reviewed which notes SR w/ competing junctional rhythm with minimal injury current. In the setting of no chestpain recommend rechecking her vital signs and repeating EKG.   Repeat VS: BP 92/66, 55bpm, 98.98F, RR 18breaths/min.   Repeat EKG @120am  notes Accelerated idioventricular rhythm.   Given the EKG changes, dig level,  her clinical presentation, bradycardia, and clinical course shared decision was to replace potassium, gentle IV hydration, stop digoxin, continue telemetry, give digifab, trend troponin, check frequent vital signs.   Will repeat limited echo in the morning.   Plan of care discussed with attending physician Dr. who's assistance in managing Ms. Nakama's care is greatly appreciated.   Adela Glimpse [facility]  Pager: [phone] Office: [phone] 07/29/21 1:54 AM

## 2021-07-29 NOTE — Progress Notes (Signed)
PT Cancellation Note  Patient Details Name: Ashley [name] MRN: [id number] DOB: 09-04-1946   Cancelled Treatment:    Reason Eval/Treat Not Completed: Patient at procedure or test/unavailable. Off unit for heart cath. Will plan to follow-up as time permits.   Raymond Gurney, PT, DPT Acute Rehabilitation Services  Pager: [phone] Office: [phone]    Jewel Baize 07/29/2021, 2:13 PM

## 2021-07-29 NOTE — Progress Notes (Signed)
Initial Nutrition Assessment  DOCUMENTATION CODES:  Severe malnutrition in context of acute illness/injury, Obesity unspecified  INTERVENTION:  Add Ensure Plus High Protein po TID, each supplement provides 350 kcal and 20 grams of protein.   Add MVI with minerals daily.  Recommend starting appetite stimulant - MD to start Remeron.  Encourage PO and supplement intake.  NUTRITION DIAGNOSIS:  Severe Malnutrition related to acute illness (debility) as evidenced by mild fat depletion, mild muscle depletion, severe muscle depletion, percent weight loss, energy intake < or equal to 50% for > or equal to 5 days, energy intake < or equal to 75% for > or equal to 1 month.  GOAL:  Patient will meet greater than or equal to 90% of their needs  MONITOR:  PO intake, Supplement acceptance, Labs, Weight trends, I & O's  REASON FOR ASSESSMENT:  Consult Assessment of nutrition requirement/status  ASSESSMENT:  75 yo female with a PMH of T2DM, HTN, HLD, CVA, and atrial tachycardia who presented with progressive weakness, anorexia, and a foul taste in her mouth. Admitted with debility.  Spoke with pt at bedside. Pt reports that her appetite is very poor and has not been eating very well the past several weeks (at least >1 month). She reports her intake has been "next to nothing."  She reports that she has been losing a lot of weight very quickly in this time frame as well, but does not know exact numbers.  Per Epic, pt has lost ~38.5 lbs (14.5%) in the past 3.5 weeks, which is significant and severe for the time frame.  Of note, pt with mild BLE edema. This may be masking further depletion in legs.  Noted that pt meets criteria for severe malnutrition given very poor intake and weight loss. Pt had begun to show depletions on exam, and these may be further depleted if edema was not present.  Pt open to taking Ensure. RD to order Ensure TID and MVI with minerals daily.   Pt also may benefit from  an appetite stimulant. RD to message MD regarding this recommendation. MD to start Remeron.  Medications: reviewed; SSI, Protonix  Labs: reviewed; CBG 103-114 (H), BUN 43 (H), Crt 1.30 (H - trending down) HbA1c: 6.2% (07/29/2021)  NUTRITION - FOCUSED PHYSICAL EXAM: Flowsheet Row Most Recent Value  Orbital Region Mild depletion  Upper Arm Region Mild depletion  Thoracic and Lumbar Region No depletion  Buccal Region Mild depletion  Temple Region Severe depletion  Clavicle Bone Region Mild depletion  Clavicle and Acromion Bone Region Mild depletion  Scapular Bone Region No depletion  Dorsal Hand Mild depletion  Patellar Region No depletion  Anterior Thigh Region No depletion  Posterior Calf Region No depletion  Edema (RD Assessment) Mild  [BLE]  Hair Reviewed  Eyes Reviewed  Mouth Reviewed  Skin Reviewed  Nails Reviewed   Diet Order:   Diet Order             Diet Carb Modified Fluid consistency: Thin; Room service appropriate? Yes with Assist  Diet effective now                  EDUCATION NEEDS:  Education needs have been addressed  Skin:  Skin Assessment: Reviewed RN Assessment  Last BM:  no BM documented  Height:  Ht Readings from Last 1 Encounters:  07/28/21 5\' 8"  (1.727 m)   Weight:  Wt Readings from Last 1 Encounters:  07/28/21 102.5 kg   BMI:  Body mass index is 34.36  kg/m.  Estimated Nutritional Needs:  Kcal:  2000-2200 Protein:  125-140 grams Fluid:  >2 L  Derrel Nip, RD, LDN (she/her/hers) Clinical Inpatient Dietitian RD Pager/After-Hours/Weekend Pager # in [place]

## 2021-07-29 NOTE — H&P (View-Only) (Signed)
CARDIOLOGY CONSULT NOTE  Patient ID: Ashley [name] MRN: [id number] DOB/AGE: [birth year] 75 y.o.  Admit date: 07/28/2021 Attending physician: Terrilee Croak, MD Primary Physician:  Deland Pretty, MD Outpatient Cardiologist: Dr. Adrian Prows, Lawerance Cruel, PA Inpatient Cardiologist: Rex Kras, DO, [facility]  Reason of consultation: Elevated troponins and digoxin level Referring physician: Dr. Toy Baker  Chief complaint: Fatigue/weakness  HPI:  Ashley [name] is a 75 y.o. African-American female who presents with a chief complaint of " tired and fatigue." Her past medical history and cardiovascular risk factors include: Diabetes mellitus type 2, hypertension, hyperlipidemia, history of CVA, pulmonary embolism, DVT, paroxysmal atrial fibrillation.  Patient was recently hospitalized from 06/19/2021 through 06/27/2021 during that visit she was diagnosed with atrial fibrillation with rapid ventricular rate and was scheduled to undergo TEE cardioversion.  However, during the TEE she was noted to be hypotensive and intermittently had a PEA arrest requiring intubation and transfer to ICU.  TEE noted dilated right ventricle with reduced systolic function and therefore CT PE protocol was performed which noted submassive PE and she underwent systemic tPA.  Patient was stabilized during the hospitalization and later discharged home.  After being home patient states that she has had a decline with regards to not wanting to consume fluids or eat her daily meals.  She felt tired, fatigued ED for further evaluation and management.  Cardiology was consulted during his hospitalization as base blood work noted elevated troponins and digoxin levels were checked which noted elevated dig levels at 4 ng/mL.  Initial plan was to do supportive care; however, due to her underlying acute kidney injury, hypokalemia, EKG findings junctional rhythm, accelerated idioventricular rhythm, sinus rhythm with  competing junctional, hypotension and vital signs.  The shared decision was to administer digiFab.  Since then her bradycardia arrhythmias have improved and on telemetry she was noted to have junctional bradycardia around 3 AM and shortly thereafter converted to sinus rhythm around 4:19 AM.  While she was asleep she was noted to have a sinus pause of 3.05 seconds at 4:36 AM per telemetry.  Currently patient is asymptomatic without any symptoms of bradycardia and no anginal discomfort.  Telemetry notes sinus bradycardia with rare PVCs and compensatory pauses. These pauses are 2 seconds in duration majority of time.  Patient recently finished her potassium replacements earlier this morning.  And her last dose of metoprolol 100 mg p.o. x1 and amiodarone for yesterday night per MAR.  ALLERGIES: Allergies  Allergen Reactions   Bystolic [Nebivolol Hcl]     Bradycardia    Coreg [Carvedilol]     bradycardia   Minoxidil     Abdominal pain    PAST MEDICAL HISTORY: Past Medical History:  Diagnosis Date   Essential hypertension 11/23/2018   Obesity 11/23/2018   Stroke ([place])     PAST SURGICAL HISTORY: Past Surgical History:  Procedure Laterality Date   CARDIOVERSION N/A 06/21/2021   Procedure: CARDIOVERSION;  Surgeon: Nigel Mormon, MD;  Location: [place] ENDOSCOPY;  Service: Cardiovascular;  Laterality: N/A;   TEE WITHOUT CARDIOVERSION N/A 06/21/2021   Procedure: TRANSESOPHAGEAL ECHOCARDIOGRAM (TEE);  Surgeon: Nigel Mormon, MD;  Location: [facility] ENDOSCOPY;  Service: Cardiovascular;  Laterality: N/A;    FAMILY HISTORY: The patient's family history includes Hypertension in her mother; Stroke in her mother.   SOCIAL HISTORY:  The patient  reports that she has never smoked. She has never been exposed to tobacco smoke. She has never used smokeless tobacco. She reports that she does not drink alcohol  and does not use drugs.  MEDICATIONS: Current Outpatient Medications  Medication  Instructions   acetaminophen (TYLENOL) 500 mg, Oral, Daily PRN   amiodarone (PACERONE) 200 MG tablet Take 200 mg twice a day for 5 days then ONCE a day   apixaban (ELIQUIS) 5 mg, Oral, 2 times daily   digoxin (LANOXIN) 0.25 mg, Oral, Daily   famotidine (PEPCID) 20 mg, Oral, Daily   hydrochlorothiazide (MICROZIDE) 12.5 MG capsule TAKE 1 CAPSULE BY MOUTH EVERY DAY   losartan (COZAAR) 12.5 mg, Oral, Daily   metoprolol tartrate (LOPRESSOR) 100 mg, Oral, 2 times daily   omeprazole (PRILOSEC) 20 mg, Oral, Daily   rosuvastatin (CRESTOR) 5 mg, Oral, Daily   spironolactone (ALDACTONE) 25 mg, Oral, Daily   torsemide (DEMADEX) 20 mg, Oral, Daily    REVIEW OF SYSTEMS: Review of Systems  Constitutional: Positive for decreased appetite and malaise/fatigue. Negative for chills and fever.  HENT:  Negative for hoarse voice and nosebleeds.   Eyes:  Negative for discharge, double vision and pain.  Cardiovascular:  Negative for chest pain, claudication, dyspnea on exertion, leg swelling, near-syncope, orthopnea, palpitations, paroxysmal nocturnal dyspnea and syncope.  Respiratory:  Negative for hemoptysis and shortness of breath.   Musculoskeletal:  Negative for muscle cramps and myalgias.  Gastrointestinal:  Negative for abdominal pain, constipation, diarrhea, hematemesis, hematochezia, melena, nausea and vomiting.  Neurological:  Negative for dizziness and light-headedness.   PHYSICAL EXAM: Vitals with BMI 07/29/2021 07/29/2021 07/29/2021  Height - - -  Weight - - -  BMI - - -  Systolic [id number]  Diastolic 63 55 71  Pulse - - -     Intake/Output Summary (Last 24 hours) at 07/29/2021 0920 Last data filed at 07/28/2021 2216 Gross per 24 hour  Intake 3 ml  Output --  Net 3 ml    Net IO Since Admission: 3 mL [07/29/21 0920]  CONSTITUTIONAL: Appears older than stated age, hemodynamically stable, No acute distress.  SKIN: Skin is warm and dry. No rash noted. No cyanosis. No pallor. No  jaundice HEAD: Normocephalic and atraumatic.  EYES: No scleral icterus MOUTH/THROAT: Dry oral membranes.  Poor oral dentition. NECK: No JVD present. No thyromegaly noted. No carotid bruits  LYMPHATIC: No visible cervical adenopathy.  CHEST Normal respiratory effort. No intercostal retractions  LUNGS: Poor air exchange with reduced breath sounds bilaterally. No stridor. No wheezes. No rales.  CARDIOVASCULAR: Bradycardic, regular, S1-S2, no murmurs rubs or gallops appreciated ABDOMINAL: Obese, soft, nontender, nondistended, positive bowel sounds in all 4 quadrants, no apparent ascites.  EXTREMITIES: Trace bilateral pitting edema, warm to touch.  HEMATOLOGIC: No significant bruising NEUROLOGIC: Oriented to person, place, and time. Nonfocal. Normal muscle tone.  PSYCHIATRIC: Normal mood and affect. Normal behavior. Cooperative  RADIOLOGY: DG Chest 2 View  Result Date: 07/28/2021 CLINICAL DATA:  Shortness of breath EXAM: CHEST - 2 VIEW COMPARISON:  Radiographs dated June 21, 2021 the FINDINGS: The heart is markedly enlarged. There are bilateral pleural effusions, right greater than the left. Underlying airspace disease cannot be excluded. IMPRESSION: 1.  Marked cardiomegaly. 2. Bilateral pleural effusions, right greater than the left. Underlying airspace disease cannot be excluded. Electronically Signed   By: Keane Police D.O.   On: 07/28/2021 14:25    LABORATORY DATA: Lab Results  Component Value Date   WBC 9.0 07/29/2021   HGB 13.1 07/29/2021   HCT 40.0 07/29/2021   MCV 72.2 (L) 07/29/2021   PLT 128 (L) 07/29/2021    Recent Labs  Lab  07/29/21 0337  NA 137  K 3.6  CL 95*  CO2 34*  BUN 43*  CREATININE 1.30*  CALCIUM 8.7*  PROT 6.2*  BILITOT 0.9  ALKPHOS 67  ALT 22  AST 60*  GLUCOSE 110*    Lipid Panel  Lab Results  Component Value Date   CHOL 163 04/05/2020   HDL 63 04/05/2020   LDLCALC 87 04/05/2020   TRIG 95 06/22/2021   CHOLHDL 2.6 04/05/2020    BNP (last 3  results) Recent Labs    06/26/21 0500 06/27/21 0441 07/28/21 1231  BNP 1,617.9* 1,914.8* 837.6*    HEMOGLOBIN A1C Lab Results  Component Value Date   HGBA1C 6.2 (H) 07/29/2021   MPG 131.24 07/29/2021    Cardiac Panel (last 3 results) Recent Labs    07/28/21 1545 07/28/21 2140 07/29/21 0337 07/29/21 0520  CKTOTAL  --  160  --   --   TROPONINIHS 812*  --  843* 853*     TSH Recent Labs    06/19/21 1229 07/29/21 0337  TSH 1.412 1.511     CARDIAC DATABASE: EKG: 07/28/2021 accelerated junctional rhythm, 75 bpm.  07/28/2021 sinus rhythm with PACs, 86 bpm IVCD  07/28/2021: NSR, 82 bpm, nonspecific IVCD  07/28/2021: Normal sinus rhythm, 82 bpm, IVCD  07/29/2021: Sinus rhythm, 53 bpm, with competing junctional, ST depressions in the high lateral leads consider ischemia, minimal ST elevations in the precordial leads V1 V2.  07/29/2021: Accelerated idioventricular rhythm, 58 bpm, nonspecific IVCD, ST depressions in the high lateral leads consider ischemia.  07/29/2021: Sinus bradycardia, 47 bpm with sinus arrhythmia, ST depressions in the high lateral leads suggestive of possible ischemia.  Echocardiogram: 06/22/2021:  1. Left ventricular ejection fraction, by estimation, is 40 to 45%. The  left ventricle has mildly decreased function. LVEF is likely  underestimated due to interentricular septal flatterning in systole and  diastole s/o RV pressure and volume overload.  There is moderate left ventricular hypertrophy. Left ventricular diastolic  function could not be evaluated. There is the interventricular septum is  flattened in systole and diastole, consistent with right ventricular  pressure and volume overload.   2. Right ventricular systolic function is moderately reduced. The right  ventricular size is mildly enlarged. There is mildly elevated pulmonary  artery systolic pressure.   3. Left atrial size was severely dilated.   4. Right atrial size was severely  dilated.   5. A small pericardial effusion is present. There is no evidence of  cardiac tamponade.   6. The mitral valve is grossly normal. Moderate to severe mitral valve  regurgitation.   7. Tricuspid valve regurgitation is severe.   8. The aortic valve is tricuspid. Aortic valve regurgitation is not  visualized.   9. Above abnormalities are new compared to previous TTE in 2021.   Lower extremity venous duplex: 06/23/2021 RIGHT:  - Findings consistent with acute deep vein thrombosis involving the right common femoral vein.  - No cystic structure found in the popliteal fossa.     LEFT:  - Findings consistent with age indeterminate deep vein thrombosis involving the left posterior tibial veins, and left peroneal veins.  - No cystic structure found in the popliteal fossa.   IMPRESSION & RECOMMENDATIONS: Ashley [name] is a 75 y.o. African-American female whose past medical history and cardiovascular risk factors include: Diabetes mellitus type 2, hypertension, hyperlipidemia, history of CVA, pulmonary embolism, DVT, paroxysmal atrial fibrillation.  Impression:  Non-STEMI.  Bradycardia: Multifactorial Digoxin toxicity most likely secondary to  underlying AKI Sinus pause Failure to thrive. Acute kidney injury secondary to intravascular depletion Atrial fibrillation, suspect paroxysmal Benign essential hypertension. Chronic HFrEF, stage B, NYHA class II Recent history of DVT and pulmonary embolism History of CVA Long-term oral anticoagulation  Plan:  Since her recent discharge from the hospitalization it appears the patient has had decreased oral intake which is led to acute kidney injury, dehydration, and failure to thrive.  In the clinical setting patient's digoxin level became supratherapeutic leading to dig toxicity with evidence of bradycardia arrhythmia in the setting of acute kidney injury and hypokalemia.  Due to hypotension, dynamic EKG changes, patient was given  DigiFab.  Since then she has converted to normal sinus rhythm but has frequent ventricular ectopy leading to compensatory pauses.  Based on telemetry the longest pause was at 4:36 AM 3.05 seconds patient states that she was possibly asleep and asymptomatic per nursing staff.  Patient needs close monitoring with telemetry.  Hold metoprolol 100 mg p.o. twice daily and amiodarone for now.  Replace electrolytes aggressively with a potassium of 4 magnesium of 2.  Repeat BMP is pending after potassium replacement. We will place pacer pads prophylactically if needed.  Monitor for now. Recommend addressing reversible causes and rule out underlying ischemia; however, despite this if she continues to have pauses may consider EP evaluation.   Given her elevated troponins, dynamic EKG changes with ST depressions in the high lateral leads, and reduced LVEF we discussed ischemic work-up prior to discharge.  In the clinical setting, if her renal function improves would recommend left heart catheterization with possible intervention tomorrow.  Discussed the risks, benefits, alternatives with the patient and she is agreeable.  N.p.o. after midnight.  We will transition her from Eliquis to IV heparin.  Continue your care with regards to her other chronic comorbid conditions and active problem list.  We will follow the patient with you.  Plan of care was discussed with nursing staff during morning rounds.   CRITICAL CARE Performed by: Rex Kras   Total critical care time: 47 minutes   Critical care time was exclusive of separately billable procedures and treating other patients.   Critical care was necessary to treat or prevent imminent or life-threatening deterioration.   Critical care was time spent personally by me on the following activities: development of treatment plan with patient and/or surrogate as well as nursing, discussions with consultants, evaluation of patient's response to treatment, examination of  patient, obtaining history from patient or surrogate, ordering and performing treatments and interventions, ordering and review of laboratory studies, ordering and review of radiographic studies, pulse oximetry and re-evaluation of patient's condition.  This note was created using a voice recognition software as a result there may be grammatical errors inadvertently enclosed that do not reflect the nature of this encounter. Every attempt is made to correct such errors.  Mechele Claude [facility]  Pager: [phone] Office: [phone] 07/29/2021, 9:20 AM  ADDENDUM: Course of events discussed with the patient is signed over the phone  The procedure of left heart catheterization with possible intervention was explained to the patient in detail.  The indication, alternatives, risks and benefits were reviewed.  Complications include but not limited to bleeding, infection, vascular injury, stroke, myocardial infection, arrhythmia, kidney injury needing short-term or long-term dialysis, radiation-related injury in the case of prolonged fluoroscopy use, emergency cardiac surgery, and death. The patient understands the risks of serious complication is 1-2 in [id number] with diagnostic cardiac cath and 1-2% or less with  angioplasty/stenting.  The patient and her son [name] voices understanding and provides verbal feedback and wishes to proceed with coronary angiography with possible PCI.  Delilah Shan [facility]  Pager: [phone] Office: [phone] 07/29/21 12:39 PM

## 2021-07-29 NOTE — Progress Notes (Addendum)
Physician notified of new and increasing cardiac pauses. Received order to notify on-call cardiologist. Cardiologist has been paged as of this time.  Vital signs otherwise WNL. No obvious signs of decreased perfusion besides perceptibly colder hands than on previous assessment.  Per admitting physician, continue 42mL/hr normal saline until 1000 due to digoxin level.

## 2021-07-29 NOTE — Progress Notes (Signed)
CRITICAL VALUE STICKER  CRITICAL VALUE: digoxin 3.8  RECEIVER (on-site recipient of call): Molli Hazard  DATE & TIME NOTIFIED: 11/10 2229  MESSENGER (representative from lab):  MD NOTIFIED: Dr. Carollee Herter  TIME OF NOTIFICATION: 2250  RESPONSE:  acknowledged, continue to monitor

## 2021-07-29 NOTE — Interval H&P Note (Signed)
History and Physical Interval Note:  07/29/2021 1:15 PM  Ashley [name]  has presented today for surgery, with the diagnosis of non-stemi.  The various methods of treatment have been discussed with the patient and family. After consideration of risks, benefits and other options for treatment, the patient has consented to  Procedure(s): LEFT HEART CATH AND CORONARY ANGIOGRAPHY (N/A) as a surgical intervention.  The patient's history has been reviewed, patient examined, no change in status, stable for surgery.  I have reviewed the patient's chart and labs.  Questions were answered to the patient's satisfaction.    2016 Appropriate Use Criteria for Coronary Revascularization in Patients With Acute Coronary Syndrome NSTEMI/UA High Risk (TIMI Score 5-7) NSTEMI/Unstable angina, stabilized patient at high risk Link Here: https://powell.info/ Indication:  Revascularization by PCI or CABG of 1 or more arteries in a patient with NSTEMI or unstable angina with Stabilization after presentation High risk for clinical events A (7) Indication: 16; Score 7   Cyris Maalouf J Breyanna Valera

## 2021-07-29 NOTE — TOC Initial Note (Signed)
Transition of Care [facility]) - Initial/Assessment Note    Patient Details  Name: Ashley [name] MRN: [id number] Date of Birth: 04-27-1946  [facility] CM/SW Contact:    Lockie Pares, RN Phone Number: 07/29/2021, 5:47 PM  Clinical Narrative:                 Patient admitted for failure to thrive, was admitted in October for the same.  Had elevated troponin, taken to the cath lab this evening. Dietary recommendations noted. PT and OT have not yet evaluated due to cath procedure.  May need HH Vs SNF, await PT OT recommendations. And cath result. CM to follow for needs, recommendaitons, and transitions.   Expected Discharge Plan: Home w Home Health Services (VS SNF PT to evaluate) Barriers to Discharge: Continued Medical Work up   Patient Goals and CMS Choice        Expected Discharge Plan and Services Expected Discharge Plan: Home w Home Health Services (VS SNF PT to evaluate)   Discharge Planning Services: CM Consult   Living arrangements for the past 2 months: Single Family Home                                      Prior Living Arrangements/Services Living arrangements for the past 2 months: Single Family Home   Patient language and need for interpreter reviewed:: Yes        Need for Family Participation in Patient Care: Yes (Comment) Care giver support system in place?: Yes (comment)   Criminal Activity/Legal Involvement Pertinent to Current Situation/Hospitalization: No - Comment as needed  Activities of Daily Living      Permission Sought/Granted                  Emotional Assessment       Orientation: : Oriented to Self, Oriented to Place, Oriented to  Time Alcohol / Substance Use: Not Applicable Psych Involvement: No (comment)  Admission diagnosis:  Elevated troponin [R77.8] Failure to thrive in adult [R62.7] FTT (failure to thrive) in adult [R62.7] Patient Active Problem List   Diagnosis Date Noted   QT prolongation  07/29/2021   Chronic HFrEF (heart failure with reduced ejection fraction) (HCC) 07/29/2021   Anasarca 07/29/2021   Protein-calorie malnutrition, severe 07/29/2021   Non-ST elevation (NSTEMI) myocardial infarction [facility])    Sinus pause    Deep vein thrombosis (DVT) of femoral vein of right lower extremity (HCC)    Failure to thrive in adult 07/28/2021   Digitalis toxicity 07/28/2021   Pulmonary embolus (HCC)    Atrial fibrillation with RVR (HCC) 06/20/2021   Paroxysmal A-fib (HCC) 06/19/2021   AKI (acute kidney injury) (HCC) 06/19/2021   Microcytosis 06/19/2021   Cerebral embolism with cerebral infarction 04/05/2020   Bradycardia 04/04/2020   Acute metabolic encephalopathy 04/04/2020   Elevated troponin 04/04/2020   Essential hypertension 11/23/2018   Controlled diabetes mellitus type II without complication (HCC) 11/23/2018   Obesity 11/23/2018   PCP:  Merri Brunette, MD Pharmacy:   [facility] [address], [place] - [address] [place] [address] Phone: [phone] Fax: [phone]  [facility] [address] [place] [address] Phone: [phone] Fax: [phone]     Social Determinants of Health (SDOH) Interventions    Readmission Risk Interventions No flowsheet data found.

## 2021-07-29 NOTE — Consult Note (Addendum)
ANTICOAGULATION CONSULT NOTE - Initial Consult  Pharmacy Consult for Heparin Indication: r/o ACS and afib/PE/DVT  Patient Measurements: Height: 5\' 8"  (172.7 cm) Weight: 102.5 kg (225 lb 15.5 oz) IBW/kg (Calculated) : 63.9 Heparin Dosing Weight: 86.7  Vital Signs: Temp: 97.5 F (36.4 C) (11/10 0546) Temp Source: Oral (11/10 0546) BP: 105/55 (11/10 0546) Pulse Rate: 55 (11/10 0103)  Labs: Recent Labs    07/28/21 1230 07/28/21 1315 07/28/21 1545 07/28/21 2140 07/29/21 0337  HGB 14.6  --   --   --  13.1  HCT 45.1  --   --   --  40.0  PLT 159  --   --   --  128*  CREATININE 1.50*  --   --  1.36* 1.30*  CKTOTAL  --   --   --  160  --   TROPONINIHS  --  640* 812*  --  843*   Estimated Creatinine Clearance: 46.8 mL/min (A) (by C-G formula based on SCr of 1.3 mg/dL (H)).  Medical History: Past Medical History:  Diagnosis Date   Essential hypertension 11/23/2018   Obesity 11/23/2018   Stroke [facility])    Assessment: 75 y.o. female with medical history significant of T2DM, HTN, hyperlipidemia, CVA, and atrial tachycardia. Patient admitted 06/19/2021 - 06/27/2021 with Afib with RVR and acute on chronic HFrEF, as well as bilateral lower extremity DVTs with submassive PE. Anticoagulated with Eliquis PTA for Afib.  Last Eliquis dose 07/28/21 2211.    Will start heparin continuous IV infusion without bolus, due to PTA Eliquis. Pt heparin level was previously therapeutic during prior admission (heparin level 0.37 on 06/23/21) on heparin 1000 units/hr continuous IV infusion. Will start pt on this infusion rate.  Goal of Therapy:  Heparin level 0.3-0.7 units/ml Monitor platelets by anticoagulation protocol: Yes   Plan:  Heparin 1000 units/hr continuous IV infusion starting at 10am Baseline aPTT and heparin level Heparin level in 8 hours Daily CBC Monitor for s/sx of bleeding  Thank You,  08/23/21, PharmD Candidate 07/29/2021,6:27 AM

## 2021-07-29 NOTE — Progress Notes (Signed)
OT Cancellation Note  Patient Details Name: Vivi Piccirilli MRN: [id number] DOB: 22-Jan-1946   Cancelled Treatment:    Reason Eval/Treat Not Completed: Patient at procedure or test/ unavailable Off unit for heart cath. Will follow-up for OT eval s/p procedure  Lorre Munroe 07/29/2021, 1:26 PM

## 2021-07-29 NOTE — Progress Notes (Signed)
PROGRESS NOTE  Ashley [name]  DOB: [birth year]  PCP: Merri Brunette, MD JOI:[id number]  DOA: 07/28/2021  LOS: 1 day  Hospital Day: 2  Chief Complaint  Patient presents with   Failure To Thrive    Brief narrative: Ashley Ifeoluwa Bartz is a 75 y.o. female with PMH significant for DM2, HTN, HLD, CVA, atrial tachycardia on digoxin. Patient presented to the ED from home on 11/9 with progressive weakness, anorexia, fall taste in her mouth. Recently hospitalized from 10/1-10/9 with A. fib with RVR, underwent TEE which was complicated by hypotension, hypoxia, intermittent PEA, patient required emergent intubation and transferred to ICU.  TEE was limited but it showed severe RV dysfunction.  Subsequent CT angio of chest showed submassive PE.  She was given systemic tPA after which she clinically improved.  She was successfully extubated.  DVT scan of legs showed acute DVT of right common femoral vein and left posterior tibial/left peroneal vein.  At discharge, she was kept on amiodarone, digoxin, metoprolol and Eliquis.  In the ED Digoxin level was noted to be high at 4 ng/mL along with low potassium of 3.4, elevated creatinine Admitted to hospitalist service Cardiology evaluated the patient in the ED See below for details.  Subjective: Patient was seen and examined this morning.  pleasant elderly african-american female.  propped up in bed.  feels weak.  Not in distress.  Not on supplemental oxygen. Family at bedside.  Currently on heparin drip. Noted events from last night. Repeat EKG later in the night showed accelerated idioventricular rhythm after which patient was given DigiFab.  Assessment/Plan: Digitalis toxicity -Presented with failure to thrive, elevated digoxin level in the setting of AKI, hypokalemia. -EKG showed ED of ventricular rhythm. -Cardiology was consulted, patient was given DigiFab.  Electrolytes addressed. -Continue telemetry monitoring  Hypokalemia -Potassium  low at 3.3 this morning.  Target to keep potassium level >4.  Ordered for 1 dose of IV potassium chloride 10 mEq and 1 tab oral 40 mEq.  Repeat BMP at 4 PM. Recent Labs  Lab 07/28/21 1230 07/28/21 2140 07/29/21 0337 07/29/21 0813  K 4.7 3.4* 3.6 3.3*  MG  --  1.9 2.0  --   PHOS  --  3.6 3.3  --    Paroxysmal A. Fib -Was on digoxin 250 mcg daily,, amiodarone 200 mg daily, metoprolol 100 mg twice daily -Cardiology consult obtained.  Currently all meds continued. -On anticoagulation with Eliquis 5 mg twice daily. Currently on heparin drip  Non-STEMI -Troponin elevated to 800s gradually trending up. -Noted a plan for cardiology for cardiac cath tomorrow. -Currently on heparin drip. Recent Labs    07/28/21 1545 07/28/21 2140 07/29/21 0337 07/29/21 0520  CKTOTAL  --  160  --   --   TROPONINIHS 812*  --  843* 853*   AKI -Baseline creatinine less than 1, creatinine elevated to peak at 1.5, gradually trending down. Recent Labs    06/21/21 2018 06/22/21 0302 06/23/21 0310 06/24/21 0358 06/25/21 0152 06/27/21 0442 07/28/21 1230 07/28/21 2140 07/29/21 0337 07/29/21 0813  BUN 44* 43* 41* 37* 35* 26* 45* 43* 43* 39*  CREATININE 1.32* 1.26* 1.21* 1.09* 0.88 0.95 1.50* 1.36* 1.30* 1.31*   Acute metabolic encephalopathy -Multifactorial: Digoxin toxicity, AKI, poor oral intake -Continue monitor mental status change  Recent history of pulm embolism -Was on Eliquis.  Currently on heparin drip  Chronic systolic CHF Essential hypertension -Home meds include metoprolol 100 mg twice daily, HCTZ 12.5 mg daily, torsemide 20 mg daily, losartan  12.5 mg daily, Aldactone 25 mg daily -Last echo with EF 45% -Currently continued on metoprolol.  Other blood pressure medicines on hold.  Type 2 diabetes mellitus -A1c 6.2 on 11/10 -Seems diet controlled at home. -Currently on sliding scale strength Accu-Cheks. Recent Labs  Lab 07/28/21 2147 07/29/21 0207 07/29/21 0500 07/29/21 0727  07/29/21 1135  GLUCAP 113* 114* 103* 108* 117*   Hypoalbuminemia Failure to thrive -Albumin low at 2.5 -Dietitian consult  Hyperlipidemia -Crestor  Mobility: Encourage ambulation.  PT eval Living condition: Lives at home Goals of care:   Code Status: Full Code  Nutritional status: Body mass index is 34.36 kg/m. Nutrition Problem: Severe Malnutrition Etiology: acute illness (debility) Signs/Symptoms: mild fat depletion, mild muscle depletion, severe muscle depletion, percent weight loss, energy intake < or equal to 50% for > or equal to 5 days, energy intake < or equal to 75% for > or equal to 1 month Percent weight loss: 14.5 % Diet:  Diet Order             Diet NPO time specified Except for: Sips with Meds  Diet effective now                  DVT prophylaxis: Heparin drip   Antimicrobials: None Fluid: Normal saline at 75 mill per hour Consultants: Cardiology Family Communication: Family at bedside  Status is: Inpatient  Remains inpatient appropriate because: Further cardiac work-up needed  Dispo: The patient is from: Home              Anticipated d/c is to: Pending PT eval              Patient currently is not medically stable to d/c.   Difficult to place patient No     Infusions:   [MAR Hold] sodium chloride     [MAR Hold] sodium chloride Stopped (07/29/21 0901)   sodium chloride 50 mL/hr at 07/29/21 1302   heparin 1,000 Units/hr (07/29/21 0908)   potassium chloride 10 mEq (07/29/21 1304)    Scheduled Meds:  [MAR Hold] feeding supplement  237 mL Oral TID BM   [MAR Hold] insulin aspart  0-9 Units Subcutaneous Q4H   [MAR Hold] multivitamin with minerals  1 tablet Oral Daily   [MAR Hold] pantoprazole  40 mg Oral Daily   [MAR Hold] rosuvastatin  5 mg Oral Daily   [MAR Hold] sodium chloride flush  3 mL Intravenous Q12H    PRN meds: [MAR Hold] sodium chloride, [MAR Hold] sodium chloride, [MAR Hold] acetaminophen **OR** [MAR Hold] acetaminophen, [MAR  Hold] HYDROcodone-acetaminophen, [MAR Hold] sodium chloride flush   Antimicrobials: Anti-infectives (From admission, onward)    None       Objective: Vitals:   07/29/21 1000 07/29/21 1200  BP: (!) 101/59 108/61  Pulse:    Resp: 20 17  Temp:    SpO2: 95%     Intake/Output Summary (Last 24 hours) at 07/29/2021 1331 Last data filed at 07/29/2021 1100 Gross per 24 hour  Intake 363 ml  Output --  Net 363 ml   Filed Weights   07/28/21 1931  Weight: 102.5 kg   Weight change:  Body mass index is 34.36 kg/m.   Physical Exam: General exam: Pleasant, elderly African-American female.  Not in distress at the time of my evaluation Skin: No rashes, lesions or ulcers. HEENT: Atraumatic, normocephalic, no obvious bleeding Lungs: Clear to auscultation bilaterally CVS: Bradycardic, no murmur GI/Abd soft, nontender, nondistended, bowel sound present CNS: Alert, awake, oriented  to place and person Psychiatry: Sad affect Extremities: No pedal edema, no calf tenderness  Data Review: I have personally reviewed the laboratory data and studies available.  F/u labs ordered Unresulted Labs (From admission, onward)     Start     Ordered   07/29/21 1800  Heparin level (unfractionated)  Once-Timed,   TIMED        07/29/21 0742   07/29/21 1800  APTT  Once-Timed,   TIMED        07/29/21 0742   07/29/21 1600  Basic metabolic panel  Once-Timed,   TIMED        07/29/21 1157   07/29/21 0743  Heparin level (unfractionated)  Daily,   R      07/29/21 0742   07/29/21 0743  APTT  Daily,   R      07/29/21 0742   07/29/21 0500  Digoxin level  Now then every 8 hours,   R      07/28/21 2355            Signed, Lorin Glass, MD [facility] 07/29/2021

## 2021-07-30 ENCOUNTER — Ambulatory Visit: Payer: Medicare Other | Admitting: Student

## 2021-07-30 DIAGNOSIS — I110 Hypertensive heart disease with heart failure: Secondary | ICD-10-CM | POA: Diagnosis not present

## 2021-07-30 DIAGNOSIS — I82532 Chronic embolism and thrombosis of left popliteal vein: Secondary | ICD-10-CM

## 2021-07-30 DIAGNOSIS — T460X1A Poisoning by cardiac-stimulant glycosides and drugs of similar action, accidental (unintentional), initial encounter: Secondary | ICD-10-CM | POA: Diagnosis not present

## 2021-07-30 DIAGNOSIS — I4891 Unspecified atrial fibrillation: Secondary | ICD-10-CM | POA: Diagnosis not present

## 2021-07-30 DIAGNOSIS — Z7901 Long term (current) use of anticoagulants: Secondary | ICD-10-CM

## 2021-07-30 DIAGNOSIS — Z8673 Personal history of transient ischemic attack (TIA), and cerebral infarction without residual deficits: Secondary | ICD-10-CM

## 2021-07-30 DIAGNOSIS — I5032 Chronic diastolic (congestive) heart failure: Secondary | ICD-10-CM | POA: Diagnosis not present

## 2021-07-30 DIAGNOSIS — E119 Type 2 diabetes mellitus without complications: Secondary | ICD-10-CM | POA: Diagnosis not present

## 2021-07-30 LAB — BASIC METABOLIC PANEL
Anion gap: 6 (ref 5–15)
Anion gap: 7 (ref 5–15)
BUN: 33 mg/dL — ABNORMAL HIGH (ref 8–23)
BUN: 28 mg/dL — ABNORMAL HIGH (ref 8–23)
CO2: 34 mmol/L — ABNORMAL HIGH (ref 22–32)
CO2: 33 mmol/L — ABNORMAL HIGH (ref 22–32)
Calcium: 8.5 mg/dL — ABNORMAL LOW (ref 8.9–10.3)
Calcium: 8.8 mg/dL — ABNORMAL LOW (ref 8.9–10.3)
Chloride: 98 mmol/L (ref 98–111)
Chloride: 97 mmol/L — ABNORMAL LOW (ref 98–111)
Creatinine, Ser: 1.03 mg/dL — ABNORMAL HIGH (ref 0.44–1.00)
Creatinine, Ser: 0.97 mg/dL (ref 0.44–1.00)
GFR, Estimated: 57 mL/min — ABNORMAL LOW (ref >60)
GFR, Estimated: >60 mL/min (ref >60)
Glucose, Bld: 90 mg/dL (ref 70–99)
Glucose, Bld: 117 mg/dL — ABNORMAL HIGH (ref 70–99)
Potassium: 3.4 mmol/L — ABNORMAL LOW (ref 3.5–5.1)
Potassium: 4 mmol/L (ref 3.5–5.1)
Sodium: 138 mmol/L (ref 135–145)
Sodium: 137 mmol/L (ref 135–145)

## 2021-07-30 LAB — CBC WITH DIFFERENTIAL/PLATELET
Abs Immature Granulocytes: 0.03 10*3/uL (ref 0.00–0.07)
Basophils Absolute: 0 10*3/uL (ref 0.0–0.1)
Basophils Relative: 0 %
Eosinophils Absolute: 0 10*3/uL (ref 0.0–0.5)
Eosinophils Relative: 0 %
HCT: 37 % (ref 36.0–46.0)
Hemoglobin: 12.1 g/dL (ref 12.0–15.0)
Immature Granulocytes: 0 %
Lymphocytes Relative: 32 %
Lymphs Abs: 2.3 10*3/uL (ref 0.7–4.0)
MCH: 23.5 pg — ABNORMAL LOW (ref 26.0–34.0)
MCHC: 32.7 g/dL (ref 30.0–36.0)
MCV: 72 fL — ABNORMAL LOW (ref 80.0–100.0)
Monocytes Absolute: 0.9 10*3/uL (ref 0.1–1.0)
Monocytes Relative: 13 %
Neutro Abs: 3.8 10*3/uL (ref 1.7–7.7)
Neutrophils Relative %: 55 %
Platelets: DECREASED 10*3/uL (ref 150–400)
RBC: 5.14 MIL/uL — ABNORMAL HIGH (ref 3.87–5.11)
RDW: 17.2 % — ABNORMAL HIGH (ref 11.5–15.5)
Smear Review: DECREASED
WBC: 7.1 10*3/uL (ref 4.0–10.5)
nRBC: 0 % (ref 0.0–0.2)

## 2021-07-30 LAB — GLUCOSE, CAPILLARY
Glucose-Capillary: 112 mg/dL — ABNORMAL HIGH (ref 70–99)
Glucose-Capillary: 116 mg/dL — ABNORMAL HIGH (ref 70–99)
Glucose-Capillary: 142 mg/dL — ABNORMAL HIGH (ref 70–99)
Glucose-Capillary: 86 mg/dL (ref 70–99)
Glucose-Capillary: 92 mg/dL (ref 70–99)
Glucose-Capillary: 92 mg/dL (ref 70–99)

## 2021-07-30 LAB — DIGOXIN LEVEL: Digoxin Level: 3.8 ng/mL (ref 0.8–2.0)

## 2021-07-30 LAB — MAGNESIUM: Magnesium: 2 mg/dL (ref 1.7–2.4)

## 2021-07-30 MED ORDER — POTASSIUM CHLORIDE 20 MEQ PO PACK
20.0000 meq | PACK | Freq: Once | ORAL | Status: AC
  Administered 2021-07-30: 20 meq via ORAL
  Filled 2021-07-30: qty 1

## 2021-07-30 MED ORDER — AMIODARONE HCL 100 MG PO TABS
100.0000 mg | ORAL_TABLET | Freq: Every day | ORAL | Status: DC
  Administered 2021-07-30 – 2021-08-02 (×4): 100 mg via ORAL
  Filled 2021-07-30 (×4): qty 1

## 2021-07-30 MED ORDER — POTASSIUM CHLORIDE 20 MEQ PO PACK
40.0000 meq | PACK | Freq: Once | ORAL | Status: AC
  Administered 2021-07-30: 40 meq via ORAL
  Filled 2021-07-30: qty 2

## 2021-07-30 MED ORDER — METOPROLOL TARTRATE 50 MG PO TABS
50.0000 mg | ORAL_TABLET | Freq: Two times a day (BID) | ORAL | Status: DC
  Administered 2021-07-30 (×2): 50 mg via ORAL
  Filled 2021-07-30 (×3): qty 1

## 2021-07-30 MED FILL — Verapamil HCl IV Soln 2.5 MG/ML: INTRAVENOUS | Qty: 2 | Status: AC

## 2021-07-30 NOTE — Progress Notes (Signed)
Progress Note  Patient Name: Ashley [name] Date of Encounter: 07/30/2021  Attending physician: Ashley Bunker, MD Primary care provider: Merri Brunette, MD Primary Cardiologist: Dr. Yates [name]  Subjective: Ashley [name] is a 75 y.o. female who was seen and examined at bedside  Accompanied by her son Ashley [name] at bedside. Patient states " she feels the best she has ever did in a long time." Denies chest pain or shortness of breath at rest or with effort related activities. No pauses overnight. Case discussed and reviewed with her nurse.  Objective: Vital Signs in the last 24 hours: Temp:  [98 F (36.7 C)-98.3 F (36.8 C)] 98.2 F (36.8 C) (11/11 1640) Pulse Rate:  [60-105] 105 (11/11 1640) Resp:  [16-20] 17 (11/11 1640) BP: (113-127)/(58-71) 127/71 (11/11 1640) SpO2:  [93 %-99 %] 98 % (11/11 1640) Weight:  [102.9 kg] 102.9 kg (11/11 0012)  Intake/Output:  Intake/Output Summary (Last 24 hours) at 07/30/2021 1840 Last data filed at 07/30/2021 1440 Gross per 24 hour  Intake 843 ml  Output 800 ml  Net 43 ml    Net IO Since Admission: 501.38 mL [07/30/21 1840]  Weights:  Filed Weights   07/28/21 1931 07/29/21 1423 07/30/21 0012  Weight: 102.5 kg 102.5 kg 102.9 kg    Telemetry: Personally reviewed.  Sinus with PACs and PAF  Physical examination: PHYSICAL EXAM: Vitals with BMI 07/30/2021 07/30/2021 07/30/2021  Height - - -  Weight - - -  BMI - - -  Systolic 127 123 409  Diastolic 71 71 68  Pulse 105 81 78    CONSTITUTIONAL: Appears older than stated age, hemodynamically stable, No acute distress.  SKIN: Skin is warm and dry. No rash noted. No cyanosis. No pallor. No jaundice HEAD: Normocephalic and atraumatic.  EYES: No scleral icterus MOUTH/THROAT: Moist oral membranes.  Poor oral dentition. NECK: No JVD present. No thyromegaly noted. No carotid bruits  LYMPHATIC: No visible cervical adenopathy.  CHEST Normal respiratory effort. No intercostal  retractions  LUNGS: Poor air exchange with reduced breath sounds bilaterally. No stridor. No wheezes. No rales.  CARDIOVASCULAR: Regular, S1-S2, no murmurs rubs or gallops appreciated ABDOMINAL: Obese, soft, nontender, nondistended, positive bowel sounds in all 4 quadrants, no apparent ascites.  EXTREMITIES: Trace bilateral pitting edema, warm to touch.  HEMATOLOGIC: No significant bruising NEUROLOGIC: Oriented to person, place, and time. Nonfocal. Normal muscle tone.  PSYCHIATRIC: Normal mood and affect. Normal behavior. Cooperative  Lab Results: Hematology Recent Labs  Lab 07/28/21 1230 07/29/21 0337 07/30/21 0445  WBC 9.4 9.0 7.1  RBC 6.33* 5.54* 5.14*  HGB 14.6 13.1 12.1  HCT 45.1 40.0 37.0  MCV 71.2* 72.2* 72.0*  MCH 23.1* 23.6* 23.5*  MCHC 32.4 32.8 32.7  RDW 18.2* 17.0* 17.2*  PLT 159 128* PLATELET CLUMPS NOTED ON SMEAR, COUNT APPEARS DECREASED    Chemistry Recent Labs  Lab 07/28/21 2140 07/29/21 0337 07/29/21 0813 07/29/21 1304 07/30/21 0445  NA 139 137 139 138 138  K 3.4* 3.6 3.3* 3.3* 3.4*  CL 93* 95* 95* 95* 98  CO2 34* 34* 34* 34* 34*  GLUCOSE 100* 110* 112* 105* 90  BUN 43* 43* 39* 39* 33*  CREATININE 1.36* 1.30* 1.31* 1.18* 1.03*  CALCIUM 8.8* 8.7* 8.7* 8.6* 8.5*  PROT 6.4* 6.2*  --   --   --   ALBUMIN 2.7* 2.5*  --   --   --   AST 61* 60*  --   --   --   ALT 23 22  --   --   --  ALKPHOS 68 67  --   --   --   BILITOT 1.3* 0.9  --   --   --   GFRNONAA 41* 43* 42* 48* 57*  ANIONGAP 12 8 10 9 6      Cardiac Enzymes: Cardiac Panel (last 3 results) Recent Labs    07/28/21 1545 07/28/21 2140 07/29/21 0337 07/29/21 0520  CKTOTAL  --  160  --   --   TROPONINIHS 812*  --  843* 853*    BNP (last 3 results) Recent Labs    06/26/21 0500 06/27/21 0441 07/28/21 1231  BNP 1,617.9* 1,914.8* 837.6*    ProBNP (last 3 results) No results for input(s): PROBNP in the last 8760 hours.   DDimer No results for input(s): DDIMER in the last 168 hours.    Hemoglobin A1c:  Lab Results  Component Value Date   HGBA1C 6.2 (H) 07/29/2021   MPG 131.24 07/29/2021    TSH  Recent Labs    06/19/21 1229 07/29/21 0337  TSH 1.412 1.511    Lipid Panel     Component Value Date/Time   CHOL 163 04/05/2020 0334   TRIG 95 06/22/2021 0302   HDL 63 04/05/2020 0334   CHOLHDL 2.6 04/05/2020 0334   VLDL 13 04/05/2020 0334   LDLCALC 87 04/05/2020 0334    Imaging: CARDIAC CATHETERIZATION  Addendum Date: 07/29/2021   Right dominant circulation Tortuous vessels with no significant coronary artery disease Normal LVEDP Nigel Mormon, MD Pager: [phone] Office: [phone]   Result Date: 07/29/2021 Images from the original result were not included. Right dominant circulation Tortuous vessels with no significant coronary artery disease Normal LVEDP Nigel Mormon, MD Pager: [phone] Office: [phone]   CARDIAC DATABASE: EEKG: 07/28/2021 accelerated junctional rhythm, 75 bpm.   07/28/2021 sinus rhythm with PACs, 86 bpm IVCD   07/28/2021: NSR, 82 bpm, nonspecific IVCD   07/28/2021: Normal sinus rhythm, 82 bpm, IVCD   07/29/2021: Sinus rhythm, 53 bpm, with competing junctional, ST depressions in the high lateral leads consider ischemia, minimal ST elevations in the precordial leads V1 V2.   07/29/2021: Accelerated idioventricular rhythm, 58 bpm, nonspecific IVCD, ST depressions in the high lateral leads consider ischemia.   07/29/2021: Sinus bradycardia, 47 bpm with sinus arrhythmia, ST depressions in the high lateral leads suggestive of possible ischemia.   Echocardiogram: 06/22/2021:   1. Left ventricular ejection fraction, by estimation, is 40 to 45%. The  left ventricle has mildly decreased function. LVEF is likely  underestimated due to interentricular septal flatterning in systole and  diastole s/o RV pressure and volume overload.  There is moderate left ventricular hypertrophy. Left ventricular diastolic  function  could not be evaluated. There is the interventricular septum is  flattened in systole and diastole, consistent with right ventricular  pressure and volume overload.   2. Right ventricular systolic function is moderately reduced. The right  ventricular size is mildly enlarged. There is mildly elevated pulmonary  artery systolic pressure.   3. Left atrial size was severely dilated.   4. Right atrial size was severely dilated.   5. A small pericardial effusion is present. There is no evidence of  cardiac tamponade.   6. The mitral valve is grossly normal. Moderate to severe mitral valve  regurgitation.   7. Tricuspid valve regurgitation is severe.   8. The aortic valve is tricuspid. Aortic valve regurgitation is not  visualized.   9. Above abnormalities are new compared to previous TTE in 2021.   Left heart  catheterization: 07/29/2021: Right dominant circulation Tortuous vessels with no significant coronary artery disease Normal LVEDP   Lower extremity venous duplex: 06/23/2021 RIGHT:  - Findings consistent with acute deep vein thrombosis involving the right common femoral vein.  - No cystic structure found in the popliteal fossa.     LEFT:  - Findings consistent with age indeterminate deep vein thrombosis involving the left posterior tibial veins, and left peroneal veins.  - No cystic structure found in the popliteal fossa.   Scheduled Meds:  amiodarone  100 mg Oral Daily   apixaban  5 mg Oral BID   feeding supplement  237 mL Oral TID BM   insulin aspart  0-9 Units Subcutaneous Q4H   metoprolol tartrate  50 mg Oral BID   multivitamin with minerals  1 tablet Oral Daily   pantoprazole  40 mg Oral Daily   rosuvastatin  5 mg Oral Daily   sodium chloride flush  3 mL Intravenous Q12H    Continuous Infusions:  sodium chloride Stopped (07/29/21 0901)   sodium chloride      PRN Meds: sodium chloride, sodium chloride, acetaminophen, HYDROcodone-acetaminophen, sodium chloride  flush   IMPRESSION & RECOMMENDATIONS: Ashley [name] is a 75 y.o. African-American female whose past medical history and cardiac risk factors include: Diabetes mellitus type 2, hypertension, hyperlipidemia, history of CVA, pulmonary embolism, DVT, paroxysmal atrial fibrillation.  Impression:  Non-STEMI: Given the elevated high sensitive troponins, and dynamic EKG changes in the setting of recently diagnosed HFrEF, CVA, and multiple other cardiovascular risk factors that shared decision was to proceed with invasive angiography to evaluate for CAD. Patient underwent left heart catheterization 07/29/2021 and was noted to have normal epicardial coronaries. Educated on the importance of risk factor modification. No additional work-up warranted at this time  Paroxysmal atrial fibrillation: Rate control: Restart Lopressor 50 mg p.o. twice daily Rhythm control: Restart amiodarone 100 mg p.o. daily Thromboembolic prophylaxis: Eliquis Hold off digoxin for now.  Long-term oral anticoagulation: Indication: Recent DVT/PE and paroxysmal A. Fib Currently on Eliquis. Patient does not endorse any evidence of bleeding Reemphasized the risks, benefits, and alternatives to oral anticoagulation with patient and son at bedside. They are aware to seek medical attention by going to the closest ER via EMS if patient notices signs of bleeding or sustains an injury which raises suspicion for internal bleeding.  Acute kidney injury secondary to intravascular depletion: Improving Baseline creatinine 0.88 mg/dL. Creatinine on presentation 1.5 mg/dL. Status post IV hydration Currently being managed by primary team  Chronic HFrEF, stage B, NYHA class II Clinically appears euvolemic. Once renal function back to baseline recommend restarting home dose of losartan and Aldactone. Continue beta-blocker therapy. Will uptitrate GDMT as an outpatient basis.  Recent history of DVT and pulmonary  embolism 06/21/2021 CT PE protocol: Submassive PE 06/23/2021 Doppler: Acute DVT right common femoral vein, age-indeterminate DVT involving left posterior tibial/left peroneal vein. Currently on oral anticoagulation.  History of CVA: As she had a part of secondary prevention.  Bradycardia: Resolved  Sinus pause: No reoccurrence overnight.   Failure to thrive: Managed per primary team  Benign essential hypertension.  Management per primary team   Had a long discussion with both the patient and her son Rudell Cobb at today's visit during morning rounds.  Their questions and concerns were addressed to their satisfaction.  Educated on the importance of medication compliance along with keeping herself well-hydrated and good oral intake of heart healthy diet.  Recommend longitudinal follow-up with cardiology once discharged.  For now cardiology will sign off please call if any questions or concerns arise or change in clinical status.  Case reviewed with attending physician Dr. Shanda Howells.  Patient's questions and concerns were addressed to her satisfaction. She voices understanding of the instructions provided during this encounter.   This note was created using a voice recognition software as a result there may be grammatical errors inadvertently enclosed that do not reflect the nature of this encounter. Every attempt is made to correct such errors.  Total time spent: 35 minutes  Mechele Claude [facility]  Pager: [phone] Office: [phone] 07/30/2021, 6:40 PM      Replace Potassium - per primary  PAFib  No bradyarrhythmia  Check EKG  Restart lopressor and amiodarone at lower doses. Lopressor 50mg  po bid and Amiodarone 100mg  po qday.  Will follow peripherally.   Rex Kras, [place], [facility]  Pager: [phone] Office: [phone]

## 2021-07-30 NOTE — Progress Notes (Signed)
ON-CALL CARDIOLOGY 07/30/21  Patient's name: Ashley [name].   MRN: [id number].    DOB: 1945/12/26 Primary care provider: Merri Brunette, MD.   Interaction regarding this patient's care today: Paged by her nurse this evening that central telemetry notified her of a 2.29 second pause.   RN checked on the patient - who was sleeping.   RN informs me the underlying rhythm is Afib.   Vital signs are stable. BP 115/74.   Recommendations: In the setting of Afib pauses upto 5 seconds are seen as long as patient is asymptomatic and hemodynamics are stable no interventions required. In addition, the 2.29 second pause occurred while she was a sleep.   Continue telemetry and close observation.   Delilah Shan [facility]  Pager: [phone] Office: [phone] 07/31/21 12:02 AM

## 2021-07-30 NOTE — Progress Notes (Addendum)
PROGRESS NOTE    Jakelyn Squyres  BMW:[id number] DOB: 11-07-1945 DOA: 07/28/2021 PCP: Merri Brunette, MD   Brief Narrative: This 75 y.o. female with PMH significant for DM2, HTN, HLD, CVA, atrial tachycardia on digoxin. She presented to the ED  with progressive weakness, anorexia,  taste changes in her mouth. She was recently hospitalized from 10/1-10/9 with A. fib with RVR, underwent TEE which was complicated by hypotension, hypoxia, intermittent PEA, patient required emergent intubation and transferred to ICU.  TEE was limited but it showed severe RV dysfunction.  Subsequent CT angio of chest showed submassive PE.  She was given systemic tPA after which she clinically improved.  She was successfully extubated.  DVT scan of legs showed acute DVT of right common femoral vein and left posterior tibial/left peroneal vein.  At discharge, she was kept on amiodarone, digoxin, metoprolol and Eliquis.   In the ED Her Digoxin level was noted to be high at 4 ng/mL along with low potassium of 3.4, elevated creatinine.   Patient was admitted for digoxin toxicity, hypokalemia and AKI.  Cardiology was consulted.  Patient underwent left heart cath.11/10. LHC showed right dominant circulation, tortuous vessels with no coronary artery disease.  Cardiology recommended no indication for anti platelet therapy.   Assessment & Plan:   Active Problems:   Essential hypertension   Controlled diabetes mellitus type II without complication (HCC)   Acute metabolic encephalopathy   Elevated troponin   Paroxysmal A-fib (HCC)   AKI (acute kidney injury) (HCC)   Pulmonary embolus (HCC)   Failure to thrive in adult   Digitalis toxicity   QT prolongation   Chronic HFrEF (heart failure with reduced ejection fraction) (HCC)   Anasarca   Non-ST elevation (NSTEMI) myocardial infarction (HCC)   Sinus pause   Deep vein thrombosis (DVT) of femoral vein of right lower extremity (HCC)   Protein-calorie malnutrition,  severe  Digitalis toxicity She presented with failure to thrive, elevated digoxin level in the setting of AKI and hypokalemia.   EKG showed a ventricular rhythm.   Cardiology was consulted, patient was given DigiFab.  Electrolytes corrected. Continue telemetry monitoring.   Hypokalemia: Replaced.  Continue to monitor.   3.4 today Target to keep potassium level >4.   Repeat BMP at 4 PM.   Paroxysmal A. Fib: Patient was on digoxin 250 mcg daily, amiodarone 200 mg daily, metoprolol 100 mg twice daily Cardiology consulted.  All medications continued except digoxin. Continue anticoagulation with Eliquis 5 mg twice daily.  NSTEMI: Troponin elevated to 800, gradually trending up. Cardiology consulted, underwent left heart cath 11/10. Patient was on heparin which was discontinued. Left heart cath showed right dominant circulation with tortious vessels without coronary artery disease.   No indication for antiplatelet therapy.   Acute kidney injury: > Improved Baseline serum creatinine less than 1, serum creatinine peaked to 1.5 Renal function back to baseline.  Acute metabolic encephalopathy: > Improved. Multifactorial, digoxin toxicity, AKI, poor p.o. intake Continue to monitor mental status change.   History of pulmonary embolism: Continue Eliquis   Chronic systolic CHF: Appears euvolemic. Continue home medications.   Metoprolol 50 twice daily.HCTZ 12.5 daily, torsemide 20 mg daily, losartan 12.5 mg daily, Aldactone 25 mg daily Last echocardiogram LVEF 45%.   Type 2 diabetes melitis: Last hemoglobin A1c 6.2. Seems diet controlled at home. Continue regular insulin sliding scale.    Hypoalbuminemia/failure to thrive Albumin low at 2.5 Dietitian consult.  Hyperlipidemia Continue Crestor   DVT prophylaxis: Eliquis Code Status: Full  code Family Communication: No family at bedside Disposition Plan:   Status is: Inpatient  Remains inpatient appropriate because:  Digoxin toxicity.  Cardio follow-up. Pending PT and OT eval. Patient is currently not medically stable to discharge.  Consultants:  Cardiology  Procedures: LHC  antimicrobials:   Anti-infectives (From admission, onward)    None        Subjective: Patient was seen and examined at bedside.  Overnight events noted.  Patient does not appear in any acute distress. She is lying comfortably in bed,  she reports feeling much better.  Objective: Vitals:   07/29/21 2000 07/30/21 0012 07/30/21 0809 07/30/21 1026  BP: (!) 114/58 113/65 123/68 123/71  Pulse: 60 71 78 81  Resp: 20 16 20 16   Temp: 98.3 F (36.8 C) 98 F (36.7 C) 98 F (36.7 C)   TempSrc: Oral Oral Oral   SpO2: 95% 99% 93% 96%  Weight:  102.9 kg    Height:        Intake/Output Summary (Last 24 hours) at 07/30/2021 1113 Last data filed at 07/30/2021 0948 Gross per 24 hour  Intake 818.38 ml  Output 800 ml  Net 18.38 ml   Filed Weights   07/28/21 1931 07/29/21 1423 07/30/21 0012  Weight: 102.5 kg 102.5 kg 102.9 kg    Examination:  General exam: Appears calm and comfortable.  Lying in the bed.  Not in any acute distress. Respiratory system: Clear to auscultation. Respiratory effort normal.  RR 15 Cardiovascular system: S1 & S2 heard, regular rate and rhythm, no murmur. Gastrointestinal system: Abdomen is soft, nontender, nondistended, BS + Central nervous system: Alert and oriented. No focal neurological deficits. Extremities: No edema, no cyanosis, no clubbing.  Dressing noted in the right wrist from [facility] Skin: No rashes, lesions or ulcers Psychiatry: Judgement and insight appear normal. Mood & affect appropriate.     Data Reviewed: I have personally reviewed following labs and imaging studies  CBC: Recent Labs  Lab 07/28/21 1230 07/29/21 0337 07/30/21 0445  WBC 9.4 9.0 7.1  NEUTROABS  --  6.0 3.8  HGB 14.6 13.1 12.1  HCT 45.1 40.0 37.0  MCV 71.2* 72.2* 72.0*  PLT 159 128* PLATELET CLUMPS NOTED ON  SMEAR, COUNT APPEARS DECREASED   Basic Metabolic Panel: Recent Labs  Lab 07/28/21 2140 07/29/21 0337 07/29/21 0813 07/29/21 1304 07/30/21 0445  NA 139 137 139 138 138  K 3.4* 3.6 3.3* 3.3* 3.4*  CL 93* 95* 95* 95* 98  CO2 34* 34* 34* 34* 34*  GLUCOSE 100* 110* 112* 105* 90  BUN 43* 43* 39* 39* 33*  CREATININE 1.36* 1.30* 1.31* 1.18* 1.03*  CALCIUM 8.8* 8.7* 8.7* 8.6* 8.5*  MG 1.9 2.0  --   --   --   PHOS 3.6 3.3  --   --   --    GFR: Estimated Creatinine Clearance: 59.2 mL/min (A) (by C-G formula based on SCr of 1.03 mg/dL (H)). Liver Function Tests: Recent Labs  Lab 07/28/21 2140 07/29/21 0337  AST 61* 60*  ALT 23 22  ALKPHOS 68 67  BILITOT 1.3* 0.9  PROT 6.4* 6.2*  ALBUMIN 2.7* 2.5*   No results for input(s): LIPASE, AMYLASE in the last 168 hours. No results for input(s): AMMONIA in the last 168 hours. Coagulation Profile: No results for input(s): INR, PROTIME in the last 168 hours. Cardiac Enzymes: Recent Labs  Lab 07/28/21 2140  CKTOTAL 160   BNP (last 3 results) No results for input(s): PROBNP in the  last 8760 hours. HbA1C: Recent Labs    07/29/21 0337  HGBA1C 6.2*   CBG: Recent Labs  Lab 07/29/21 1631 07/29/21 2130 07/30/21 0015 07/30/21 0455 07/30/21 0808  GLUCAP 96 107* 92 86 92   Lipid Profile: No results for input(s): CHOL, HDL, LDLCALC, TRIG, CHOLHDL, LDLDIRECT in the last 72 hours. Thyroid Function Tests: Recent Labs    07/29/21 0337  TSH 1.511   Anemia Panel: No results for input(s): VITAMINB12, FOLATE, FERRITIN, TIBC, IRON, RETICCTPCT in the last 72 hours. Sepsis Labs: No results for input(s): PROCALCITON, LATICACIDVEN in the last 168 hours.  Recent Results (from the past 240 hour(s))  Resp Panel by RT-PCR (Flu A&B, Covid) Nasopharyngeal Swab     Status: None   Collection Time: 07/28/21  8:19 PM   Specimen: Nasopharyngeal Swab; Nasopharyngeal(NP) swabs in vial transport medium  Result Value Ref Range Status   SARS  Coronavirus 2 by RT PCR NEGATIVE NEGATIVE Final    Comment: (NOTE) SARS-CoV-2 target nucleic acids are NOT DETECTED.  The SARS-CoV-2 RNA is generally detectable in upper respiratory specimens during the acute phase of infection. The lowest concentration of SARS-CoV-2 viral copies this assay can detect is 138 copies/mL. A negative result does not preclude SARS-Cov-2 infection and should not be used as the sole basis for treatment or other patient management decisions. A negative result may occur with  improper specimen collection/handling, submission of specimen other than nasopharyngeal swab, presence of viral mutation(s) within the areas targeted by this assay, and inadequate number of viral copies(<138 copies/mL). A negative result must be combined with clinical observations, patient history, and epidemiological information. The expected result is Negative.  Fact Sheet for Patients:  BloggerCourse.com  Fact Sheet for Healthcare Providers:  SeriousBroker.it  This test is no t yet approved or cleared by the [place] FDA and  has been authorized for detection and/or diagnosis of SARS-CoV-2 by FDA under an Emergency Use Authorization (EUA). This EUA will remain  in effect (meaning this test can be used) for the duration of the COVID-19 declaration under Section 564(b)(1) of the Act, 21 U.S.C.section 360bbb-3(b)(1), unless the authorization is terminated  or revoked sooner.       Influenza A by PCR NEGATIVE NEGATIVE Final   Influenza B by PCR NEGATIVE NEGATIVE Final    Comment: (NOTE) The Xpert Xpress SARS-CoV-2/FLU/RSV plus assay is intended as an aid in the diagnosis of influenza from Nasopharyngeal swab specimens and should not be used as a sole basis for treatment. Nasal washings and aspirates are unacceptable for Xpert Xpress SARS-CoV-2/FLU/RSV testing.  Fact Sheet for  Patients: BloggerCourse.com  Fact Sheet for Healthcare Providers: SeriousBroker.it  This test is not yet approved or cleared by the [place] FDA and has been authorized for detection and/or diagnosis of SARS-CoV-2 by FDA under an Emergency Use Authorization (EUA). This EUA will remain in effect (meaning this test can be used) for the duration of the COVID-19 declaration under Section 564(b)(1) of the Act, 21 U.S.C. section 360bbb-3(b)(1), unless the authorization is terminated or revoked.  Performed at [facility], [address] [place], [place] [address]     Radiology Studies: DG Chest 2 View  Result Date: 07/28/2021 CLINICAL DATA:  Shortness of breath EXAM: CHEST - 2 VIEW COMPARISON:  Radiographs dated June 21, 2021 the FINDINGS: The heart is markedly enlarged. There are bilateral pleural effusions, right greater than the left. Underlying airspace disease cannot be excluded. IMPRESSION: 1.  Marked cardiomegaly. 2. Bilateral pleural effusions,  right greater than the left. Underlying airspace disease cannot be excluded. Electronically Signed   By: Larose Hires D.O.   On: 07/28/2021 14:25   CARDIAC CATHETERIZATION  Addendum Date: 07/29/2021   Right dominant circulation Tortuous vessels with no significant coronary artery disease Normal LVEDP Elder Negus, MD Pager: [phone] Office: [phone]   Result Date: 07/29/2021 Images from the original result were not included. Right dominant circulation Tortuous vessels with no significant coronary artery disease Normal LVEDP Elder Negus, MD Pager: [phone] Office: [phone]    Scheduled Meds:  amiodarone  100 mg Oral Daily   apixaban  5 mg Oral BID   feeding supplement  237 mL Oral TID BM   insulin aspart  0-9 Units Subcutaneous Q4H   metoprolol tartrate  50 mg Oral BID   multivitamin with minerals  1 tablet Oral Daily   pantoprazole  40 mg Oral  Daily   potassium chloride  20 mEq Oral Once   rosuvastatin  5 mg Oral Daily   sodium chloride flush  3 mL Intravenous Q12H   Continuous Infusions:  sodium chloride Stopped (07/29/21 0901)   sodium chloride       LOS: 2 days    Time spent: 35 mins    Brannon Levene, MD [facility]   If 7PM-7AM, please contact night-coverage

## 2021-07-30 NOTE — Evaluation (Signed)
Occupational Therapy Evaluation Patient Details Name: Ashley [name] MRN: [id number] DOB: Aug 24, 1946 Today's Date: 07/30/2021   History of Present Illness Ashley [name] is a 75 y.o. female presented with progressive weakness, anorexia, foul taste in her mouth, debility. At first was able to participate with PT/OT but now unable. Found to have digitalis toxicity,hypokalemia, and non-STEMI, left heart cath 11/10. PHMx: DM-2, HTN, HLD,CVA, atrial tachycardia; last month was admitted with A. fib with RVR-underwent a TEE which was complicated by severe hypoxia/hypotension-intermittent PEA-requiring emergent intubation and transfer to the [place] chest showed submassive PE-she was subsequently given systemic tPA. Pt was extubated on 10/4, acute DVT right common femoral vein, age-indeterminate DVT involving left posterior tibial/left peroneal vein   Clinical Impression   This 75 yo female admitted with above presents to acute OT with PLOF of needing more A right before she came into hospital due to weakness, but a few weeks ago she was close to Mod I with all basic ADLs. Currently she is setup-Mod A for basic ADLs and will continue to benefit from acute OT with follow up [place] with intermittent S/A prn.      Recommendations for follow up therapy are one component of a multi-disciplinary discharge planning process, led by the attending physician.  Recommendations may be updated based on patient status, additional functional criteria and insurance authorization.   Follow Up Recommendations  Home health OT    Assistance Recommended at Discharge Intermittent Supervision/Assistance  Functional Status Assessment  Patient has had a recent decline in their functional status and demonstrates the ability to make significant improvements in function in a reasonable and predictable amount of time.  Equipment Recommendations  None recommended by OT       Precautions / Restrictions  Precautions Precautions: Fall Restrictions Weight Bearing Restrictions: No      Mobility Bed Mobility Overal bed mobility: Needs Assistance Bed Mobility: Supine to Sit     Supine to sit: Supervision;HOB elevated     General bed mobility comments: increased time    Transfers Overall transfer level: Needs assistance Equipment used: Rolling walker (2 wheels) Transfers: Sit to/from Stand Sit to Stand: Min assist           General transfer comment: VCs for safe hand placement      Balance Overall balance assessment: Needs assistance Sitting-balance support: No upper extremity supported;Feet supported Sitting balance-Leahy Scale: Good     Standing balance support: Bilateral upper extremity supported Standing balance-Leahy Scale: Poor                             ADL either performed or assessed with clinical judgement   ADL Overall ADL's : Needs assistance/impaired Eating/Feeding: Independent;Sitting   Grooming: Set up;Sitting   Upper Body Bathing: Set up;Sitting   Lower Body Bathing: Min guard;Sit to/from stand   Upper Body Dressing : Set up;Sitting   Lower Body Dressing: Min guard;Sit to/from stand Lower Body Dressing Details (indicate cue type and reason): turns sideways (both ways) while seated on bed to donn socks; min A sit<>stand Toilet Transfer: Minimal assistance;Ambulation;Rolling walker (2 wheels) Toilet Transfer Details (indicate cue type and reason): simulated bed>door with RW>recliner Toileting- Clothing Manipulation and Hygiene: Moderate assistance Toileting - Clothing Manipulation Details (indicate cue type and reason): min A sit<>stand             Vision Patient Visual Report: No change from baseline  Pertinent Vitals/Pain Pain Assessment: No/denies pain     Hand Dominance  right   Extremity/Trunk Assessment Upper Extremity Assessment Upper Extremity Assessment: Overall WFL for tasks assessed            Communication Communication Communication: No difficulties   Cognition Arousal/Alertness: Awake/alert Behavior During Therapy: WFL for tasks assessed/performed                                   General Comments: Hard to follow her answers to questions at times, needed to ask for clarification.                Home Living Family/patient expects to be discharged to:: Private residence Living Arrangements: Children Available Help at Discharge: Family;Available 24 hours/day Type of Home: House Home Access: Level entry     Home Layout: One level     Bathroom Shower/Tub: Occupational psychologist: Handicapped height Bathroom Accessibility: No   Home Equipment: Grab bars - toilet;Rollator (4 wheels);Cane - single point;Shower seat;Hand held shower head;Grab bars - tub/shower          Prior Functioning/Environment Prior Level of Function : Needs assist       Physical Assist : Mobility (physical);ADLs (physical)     Mobility Comments: 4 weeks ago was walking indpendental, ambulates with RW and assist, ADLs Comments: 4 weeks ago independently, before hospitalization needed to be seated for bathing, grooming and dressing.        OT Problem List: Decreased strength;Impaired balance (sitting and/or standing)      OT Treatment/Interventions: Self-care/ADL training;DME and/or AE instruction;Patient/family education;Balance training    OT Goals(Current goals can be found in the care plan section) Acute Rehab OT Goals Patient Stated Goal: to continue to get better and go home OT Goal Formulation: With patient Time For Goal Achievement: 08/13/21 Potential to Achieve Goals: Good  OT Frequency: Min 2X/week           Co-evaluation PT/OT/SLP Co-Evaluation/Treatment: Yes Reason for Co-Treatment: For patient/therapist safety;To address functional/ADL transfers PT goals addressed during session: Mobility/safety with mobility;Balance;Proper use of  DME;Strengthening/ROM OT goals addressed during session: ADL's and self-care      AM-PAC OT "6 Clicks" Daily Activity     Outcome Measure Help from another person eating meals?: None Help from another person taking care of personal grooming?: A Little Help from another person toileting, which includes using toliet, bedpan, or urinal?: A Lot Help from another person bathing (including washing, rinsing, drying)?: A Little Help from another person to put on and taking off regular upper body clothing?: A Little Help from another person to put on and taking off regular lower body clothing?: A Little 6 Click Score: 18   End of Session Equipment Utilized During Treatment: Gait belt;Rolling walker (2 wheels) Nurse Communication:  (called front desk to let them know pt needed a new purewick)  Activity Tolerance: Patient tolerated treatment well Patient left: in chair;with call bell/phone within reach;with chair alarm set;with family/visitor present  OT Visit Diagnosis: Unsteadiness on feet (R26.81);Other abnormalities of gait and mobility (R26.89);Muscle weakness (generalized) (M62.81)                Time: [id number] OT Time Calculation (min): 27 min Charges:  OT General Charges $OT Visit: 1 Visit OT Evaluation $OT Eval Moderate Complexity: 1 Mod  [address], OTR/L Acute [facility] [phone] Office [phone]    Almon Register  07/30/2021, 4:01 PM

## 2021-07-30 NOTE — Evaluation (Signed)
Physical Therapy Evaluation Patient Details Name: Ashley [name] MRN: [id number] DOB: 1946-03-08 Today's Date: 07/30/2021  History of Present Illness  Ashley [name] is a 75 y.o. female presented with progressive weakness, anorexia, foul taste in her mouth, debility. At first was able to participate with PT/OT but now unable. Found to have digitalis toxicity,hypokalemia, and non-STEMI, left heart cath 11/10. PHMx: DM-2, HTN, HLD,CVA, atrial tachycardia; last month was admitted with A. fib with RVR-underwent a TEE which was complicated by severe hypoxia/hypotension-intermittent PEA-requiring emergent intubation and transfer to the [facility] chest showed submassive PE-she was subsequently given systemic tPA. Pt was extubated on 10/4, acute DVT right common femoral vein, age-indeterminate DVT involving left posterior tibial/left peroneal vein  Clinical Impression  PTA pt living with granddaughter in single story home with level entry. 4 weeks ago pt independent with ambulation without AD and independent with ADLS. Pt is limited in safe mobility by increased fatigue in presence of generalized weakness. Pt is currently min guard for bed mobility and min A for transfers and short distance ambulation with RW. PT recommends HHPT at discharge to return to PLOF. PT will continue to follow acutely.      Recommendations for follow up therapy are one component of a multi-disciplinary discharge planning process, led by the attending physician.  Recommendations may be updated based on patient status, additional functional criteria and insurance authorization.  Follow Up Recommendations Home health PT    Assistance Recommended at Discharge Frequent or constant Supervision/Assistance  Functional Status Assessment Patient has had a recent decline in their functional status and demonstrates the ability to make significant improvements in function in a reasonable and predictable amount of time.  Equipment  Recommendations  None recommended by PT       Precautions / Restrictions Precautions Precautions: Fall Restrictions Weight Bearing Restrictions: No      Mobility  Bed Mobility Overal bed mobility: Needs Assistance Bed Mobility: Supine to Sit     Supine to sit: Supervision;HOB elevated     General bed mobility comments: increased time    Transfers Overall transfer level: Needs assistance Equipment used: Rolling walker (2 wheels) Transfers: Sit to/from Stand Sit to Stand: Min assist           General transfer comment: VCs for safe hand placement    Ambulation/Gait Ambulation/Gait assistance: Min assist Gait Distance (Feet): 5 Feet Assistive device: Rolling walker (2 wheels) Gait Pattern/deviations: Step-to pattern;Decreased step length - right;Decreased step length - left;Shuffle;Trunk flexed Gait velocity: slowed Gait velocity interpretation: <1.31 ft/sec, indicative of household ambulator   General Gait Details: min A for steadying with ambulation to chair, vc for proximity to RW         Balance Overall balance assessment: Needs assistance Sitting-balance support: No upper extremity supported;Feet supported Sitting balance-Leahy Scale: Good     Standing balance support: Bilateral upper extremity supported Standing balance-Leahy Scale: Poor                               Pertinent Vitals/Pain Pain Assessment: No/denies pain    Home Living Family/patient expects to be discharged to:: Private residence Living Arrangements: Children Available Help at Discharge: Family;Available 24 hours/day Type of Home: House Home Access: Level entry       Home Layout: One level Home Equipment: Grab bars - toilet;Rollator (4 wheels);Cane - single point;Shower seat;Hand held shower head;Grab bars - tub/shower Additional Comments: has shower chair    Prior  Function Prior Level of Function : Needs assist       Physical Assist : Mobility  (physical);ADLs (physical)     Mobility Comments: 4 weeks ago was walking indpendental, ambulates with RW and assist, ADLs Comments: 4 weeks ago independently, before hospitalization needed to be seated for bathing, grooming and dressing.     Hand Dominance   Dominant Hand: Right    Extremity/Trunk Assessment   Upper Extremity Assessment Upper Extremity Assessment: Defer to OT evaluation    Lower Extremity Assessment Lower Extremity Assessment: Generalized weakness    Cervical / Trunk Assessment Cervical / Trunk Assessment: Kyphotic  Communication   Communication: No difficulties  Cognition Arousal/Alertness: Awake/alert Behavior During Therapy: WFL for tasks assessed/performed                                   General Comments: Hard to follow her answers to questions at times, needed to ask for clarification.        General Comments General comments (skin integrity, edema, etc.): VSS on RA        Assessment/Plan    PT Assessment Patient needs continued PT services  PT Problem List Decreased strength;Decreased activity tolerance;Decreased balance;Decreased mobility;Cardiopulmonary status limiting activity       PT Treatment Interventions DME instruction;Gait training;Functional mobility training;Therapeutic activities;Therapeutic exercise;Balance training;Cognitive remediation;Patient/family education    PT Goals (Current goals can be found in the Care Plan section)  Acute Rehab PT Goals PT Goal Formulation: With patient/family Time For Goal Achievement: 08/13/21 Potential to Achieve Goals: Fair    Frequency Min 3X/week        Co-evaluation PT/OT/SLP Co-Evaluation/Treatment: Yes Reason for Co-Treatment: For patient/therapist safety PT goals addressed during session: Mobility/safety with mobility OT goals addressed during session: ADL's and self-care       AM-PAC PT "6 Clicks" Mobility  Outcome Measure Help needed turning from your  back to your side while in a flat bed without using bedrails?: None Help needed moving from lying on your back to sitting on the side of a flat bed without using bedrails?: None Help needed moving to and from a bed to a chair (including a wheelchair)?: A Little Help needed standing up from a chair using your arms (e.g., wheelchair or bedside chair)?: A Little Help needed to walk in hospital room?: A Little Help needed climbing 3-5 steps with a railing? : A Lot 6 Click Score: 19    End of Session Equipment Utilized During Treatment: Gait belt Activity Tolerance: Patient tolerated treatment well Patient left: in chair;with call bell/phone within reach;with family/visitor present Nurse Communication: Mobility status PT Visit Diagnosis: Unsteadiness on feet (R26.81);Other abnormalities of gait and mobility (R26.89);Muscle weakness (generalized) (M62.81);Difficulty in walking, not elsewhere classified (R26.2);Adult, failure to thrive (R62.7)    Time: 2353-6144 PT Time Calculation (min) (ACUTE ONLY): 27 min   Charges:   PT Evaluation $PT Eval Moderate Complexity: 1 Mod          Hinley Brimage B. Beverely Risen PT, DPT Acute Rehabilitation Services Pager [phone] Office [phone]   Elon Alas Fleet 07/30/2021, 5:24 PM

## 2021-07-30 NOTE — Progress Notes (Signed)
PT Cancellation Note  Patient Details Name: Ashley [name] MRN: [id number] DOB: 20-Nov-1945   Cancelled Treatment:    Reason Eval/Treat Not Completed: (P) Medical issues which prohibited therapy RN asked to hold as she has just done an EKG and is awaiting Cardiology consult. PT will follow back this afternoon as able.  Vannie Hilgert B. Beverely Risen PT, DPT Acute Rehabilitation Services Pager [phone] Office [phone]    Elon Alas Fleet 07/30/2021, 9:39 AM

## 2021-07-30 NOTE — Progress Notes (Signed)
OT Cancellation Note  Patient Details Name: Ashley [name] MRN: [id number] DOB: Aug 14, 1946   Cancelled Treatment:    Reason Eval/Treat Not Completed: Medical issues which prohibited therapy. Saw PT note from earlier and no Cardiology note in chart as of yet, will await their note and eval as appropriate as schedule allows.  Ignacia Palma, OTR/L Acute Rehab Services Pager [phone] Office [phone]    Ashley [name] 07/30/2021, 10:27 AM

## 2021-07-30 NOTE — Care Management Important Message (Signed)
Important Message  Patient Details  Name: Ashley [name] MRN: [id number] Date of Birth: 01/11/1946   Medicare Important Message Given:  Yes     Renie Ora 07/30/2021, 4:08 PM

## 2021-07-30 NOTE — Progress Notes (Signed)
Patient had a 2.29 sec pause at 23:36 while she was sleeping. Heart rate was fluctuating 40-50's. BP 115/74, SpO2- 92% on 2L, BU38 Notified Dr. Odis Hollingshead. No new order received.

## 2021-07-31 DIAGNOSIS — I455 Other specified heart block: Secondary | ICD-10-CM

## 2021-07-31 DIAGNOSIS — N179 Acute kidney failure, unspecified: Secondary | ICD-10-CM | POA: Diagnosis not present

## 2021-07-31 DIAGNOSIS — G9341 Metabolic encephalopathy: Secondary | ICD-10-CM | POA: Diagnosis not present

## 2021-07-31 LAB — BASIC METABOLIC PANEL
Anion gap: 6 (ref 5–15)
BUN: 24 mg/dL — ABNORMAL HIGH (ref 8–23)
CO2: 32 mmol/L (ref 22–32)
Calcium: 8.6 mg/dL — ABNORMAL LOW (ref 8.9–10.3)
Chloride: 99 mmol/L (ref 98–111)
Creatinine, Ser: 0.85 mg/dL (ref 0.44–1.00)
GFR, Estimated: >60 mL/min (ref >60)
Glucose, Bld: 99 mg/dL (ref 70–99)
Potassium: 3.8 mmol/L (ref 3.5–5.1)
Sodium: 137 mmol/L (ref 135–145)

## 2021-07-31 LAB — MAGNESIUM: Magnesium: 2 mg/dL (ref 1.7–2.4)

## 2021-07-31 LAB — GLUCOSE, CAPILLARY
Glucose-Capillary: 107 mg/dL — ABNORMAL HIGH (ref 70–99)
Glucose-Capillary: 127 mg/dL — ABNORMAL HIGH (ref 70–99)
Glucose-Capillary: 150 mg/dL — ABNORMAL HIGH (ref 70–99)
Glucose-Capillary: 84 mg/dL (ref 70–99)
Glucose-Capillary: 91 mg/dL (ref 70–99)
Glucose-Capillary: 93 mg/dL (ref 70–99)

## 2021-07-31 LAB — CBC
HCT: 35.3 % — ABNORMAL LOW (ref 36.0–46.0)
Hemoglobin: 11.3 g/dL — ABNORMAL LOW (ref 12.0–15.0)
MCH: 23.3 pg — ABNORMAL LOW (ref 26.0–34.0)
MCHC: 32 g/dL (ref 30.0–36.0)
MCV: 72.9 fL — ABNORMAL LOW (ref 80.0–100.0)
Platelets: 103 10*3/uL — ABNORMAL LOW (ref 150–400)
RBC: 4.84 MIL/uL (ref 3.87–5.11)
RDW: 17.2 % — ABNORMAL HIGH (ref 11.5–15.5)
WBC: 6.6 10*3/uL (ref 4.0–10.5)
nRBC: 0 % (ref 0.0–0.2)

## 2021-07-31 LAB — PHOSPHORUS: Phosphorus: 1.6 mg/dL — ABNORMAL LOW (ref 2.5–4.6)

## 2021-07-31 LAB — DIGOXIN LEVEL: Digoxin Level: 2.5 ng/mL — ABNORMAL HIGH (ref 0.8–2.0)

## 2021-07-31 MED ORDER — POTASSIUM CHLORIDE 20 MEQ PO PACK
40.0000 meq | PACK | Freq: Once | ORAL | Status: AC
  Administered 2021-07-31: 40 meq via ORAL
  Filled 2021-07-31: qty 2

## 2021-07-31 MED ORDER — METOPROLOL TARTRATE 25 MG PO TABS
25.0000 mg | ORAL_TABLET | Freq: Two times a day (BID) | ORAL | Status: DC
  Administered 2021-07-31 – 2021-08-02 (×5): 25 mg via ORAL
  Filled 2021-07-31 (×5): qty 1

## 2021-07-31 MED ORDER — KETOTIFEN FUMARATE 0.025 % OP SOLN
1.0000 [drp] | Freq: Two times a day (BID) | OPHTHALMIC | Status: DC
  Administered 2021-07-31 – 2021-08-02 (×5): 1 [drp] via OPHTHALMIC
  Filled 2021-07-31: qty 5

## 2021-07-31 NOTE — Plan of Care (Signed)

## 2021-07-31 NOTE — Progress Notes (Signed)
PROGRESS NOTE    Ashley [name]  [id number] DOB: 28-Feb-1946 DOA: 07/28/2021 PCP: Merri Brunette, MD   Brief Narrative: This 75 y.o. female with PMH significant for DM2, HTN, HLD, CVA, atrial tachycardia on digoxin. She presented to the ED  with progressive weakness, anorexia,  taste changes in her mouth. She was recently hospitalized from 10/1-10/9 with A. fib with RVR, underwent TEE which was complicated by hypotension, hypoxia, intermittent PEA, patient required emergent intubation and transferred to ICU.  TEE was limited but it showed severe RV dysfunction.  Subsequent CT angio of chest showed submassive PE.  She was given systemic tPA after which she clinically improved.  She was successfully extubated.  DVT scan of legs showed acute DVT of right common femoral vein and left posterior tibial/left peroneal vein.  At discharge, she was kept on amiodarone, digoxin, metoprolol and Eliquis.   In the ED Her Digoxin level was noted to be high at 4 ng/mL along with low potassium of 3.4, elevated creatinine.   Patient was admitted for digoxin toxicity, hypokalemia and AKI.  Cardiology was consulted.  Patient underwent left heart cath.11/10. LHC showed right dominant circulation, tortuous vessels with no coronary artery disease.  Cardiology recommended no indication for anti platelet therapy.   Assessment & Plan:   Active Problems:   Essential hypertension   Controlled diabetes mellitus type II without complication (HCC)   Acute metabolic encephalopathy   Elevated troponin   Paroxysmal atrial fibrillation (HCC)   AKI (acute kidney injury) (HCC)   Pulmonary embolus (HCC)   Failure to thrive in adult   Digitalis toxicity   QT prolongation   Chronic HFrEF (heart failure with reduced ejection fraction) (HCC)   Anasarca   Non-STEMI (non-ST elevated myocardial infarction) (HCC)   Sinus pause   Deep vein thrombosis (DVT) of femoral vein of right lower extremity (HCC)   Protein-calorie  malnutrition, severe   Long term (current) use of anticoagulants   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   EKG showed a ventricular rhythm.   Cardiology was consulted, patient was given DigiFab.  Electrolytes corrected. Continue telemetry monitoring.  Recheck digoxin level again today.   Hypokalemia: 3.8 today.  Target to keep over 4.  Will replace some.   Paroxysmal A. Fib: Patient was on digoxin 250 mcg daily, amiodarone 200 mg daily, metoprolol 100 mg twice daily. Cardiology consulted.  Currently on amiodarone 100 mg p.o. daily and metoprolol 50 mg p.o. twice daily.  Patient had 2.6-second pause last night and has been having low heart rate.  We will reduce her Lopressor to 25 mg twice daily.  Continue current amiodarone.  Watch for another night.  If no significant pause, can discharge home tomorrow. Continue anticoagulation with Eliquis 5 mg twice daily.  NSTEMI: Troponin elevated to 800, gradually trending up. Cardiology consulted, underwent left heart cath 11/10. Patient was on heparin which was discontinued. Left heart cath showed right dominant circulation with tortious vessels without coronary artery disease.   No indication for antiplatelet therapy.   Acute kidney injury: > Improved Baseline serum creatinine less than 1, serum creatinine peaked to 1.5 Renal function back to baseline.  Acute metabolic encephalopathy: > Improved. Multifactorial, digoxin toxicity, AKI, poor p.o. intake Currently alert and oriented.  History of pulmonary embolism: Continue Eliquis   Chronic systolic  CHF: Appears euvolemic. Continue home medications.   HCTZ 12.5 daily, torsemide 20 mg daily, losartan 12.5 mg daily, Aldactone 25 mg daily Last echocardiogram LVEF 45%.   Type 2 diabetes melitis: Last  hemoglobin A1c 6.2. Seems diet controlled at home. Continue regular insulin sliding scale.   Hypoalbuminemia/failure to thrive Albumin low at 2.5 Dietitian consult.  Hyperlipidemia Continue Crestor  Viral conjunctivitis: Now complains of some burning in the eyes but no vision changes.  Appears to have bilateral conjunctival injection.  Seems to be likely viral conjunctivitis.  Will order antihistamine eyedrops.  DVT prophylaxis: Eliquis Code Status: Full code Family Communication: No family at bedside Disposition Plan:   Status is: Inpatient  Remains inpatient appropriate because: Still having sinus pauses with bradycardia.  Consultants:  Cardiology  Procedures: LHC  antimicrobials:   Anti-infectives (From admission, onward)    None        Subjective: Patient seen and examined.  She complains of burning pain in the eyes.  No change in the vision.  No chest pain or shortness of breath or other complaint.  Objective: Vitals:   07/30/21 2343 07/31/21 0000 07/31/21 0401 07/31/21 0912  BP: 115/74 132/69 121/64 122/75  Pulse:  61 (!) 55 74  Resp:  19 16 (!) 21  Temp:  98 F (36.7 C) 98 F (36.7 C)   TempSrc:  Oral Oral   SpO2:  91%  94%  Weight:   104.3 kg   Height:        Intake/Output Summary (Last 24 hours) at 07/31/2021 1054 Last data filed at 07/31/2021 0501 Gross per 24 hour  Intake 420 ml  Output 200 ml  Net 220 ml    Filed Weights   07/29/21 1423 07/30/21 0012 07/31/21 0401  Weight: 102.5 kg 102.9 kg 104.3 kg    Examination:  General exam: Appears calm and comfortable, bilateral conjunctival injection Respiratory system: Clear to auscultation. Respiratory effort normal. Cardiovascular system: S1 & S2 heard, bradycardia with irregularly irregular rate and rhythm. No JVD, murmurs, rubs, gallops or clicks. No pedal edema. Gastrointestinal system: Abdomen is nondistended, soft and nontender. No organomegaly or masses felt. Normal bowel sounds  heard. Central nervous system: Alert and oriented. No focal neurological deficits. Extremities: Symmetric 5 x 5 power. Skin: No rashes, lesions or ulcers.  Psychiatry: Judgement and insight appear normal. Mood & affect appropriate.    Data Reviewed: I have personally reviewed following labs and imaging studies  CBC: Recent Labs  Lab 07/28/21 1230 07/29/21 0337 07/30/21 0445 07/31/21 0522  WBC 9.4 9.0 7.1 6.6  NEUTROABS  --  6.0 3.8  --   HGB 14.6 13.1 12.1 11.3*  HCT 45.1 40.0 37.0 35.3*  MCV 71.2* 72.2* 72.0* 72.9*  PLT 159 128* PLATELET CLUMPS NOTED ON SMEAR, COUNT APPEARS DECREASED 103*    Basic Metabolic Panel: Recent Labs  Lab 07/28/21 2140 07/29/21 0337 07/29/21 0813 07/29/21 1304 07/30/21 0445 07/30/21 1750 07/31/21 0522  NA 139 137 139 138 138 137 137  K 3.4* 3.6 3.3* 3.3* 3.4* 4.0 3.8  CL 93* 95* 95* 95* 98 97* 99  CO2 34* 34* 34* 34* 34* 33* 32  GLUCOSE 100* 110* 112* 105* 90 117* 99  BUN 43* 43* 39* 39* 33* 28* 24*  CREATININE 1.36* 1.30* 1.31* 1.18* 1.03* 0.97 0.85  CALCIUM 8.8* 8.7* 8.7* 8.6* 8.5* 8.8* 8.6*  MG 1.9 2.0  --   --   --  2.0 2.0  PHOS 3.6 3.3  --   --   --   --  1.6*    GFR: Estimated Creatinine Clearance: 72.3 mL/min (by C-G formula based on SCr of 0.85 mg/dL). Liver Function Tests: Recent Labs  Lab 07/28/21 2140 07/29/21 0337  AST 61* 60*  ALT 23 22  ALKPHOS 68 67  BILITOT 1.3* 0.9  PROT 6.4* 6.2*  ALBUMIN 2.7* 2.5*    No results for input(s): LIPASE, AMYLASE in the last 168 hours. No results for input(s): AMMONIA in the last 168 hours. Coagulation Profile: No results for input(s): INR, PROTIME in the last 168 hours. Cardiac Enzymes: Recent Labs  Lab 07/28/21 2140  CKTOTAL 160    BNP (last 3 results) No results for input(s): PROBNP in the last 8760 hours. HbA1C: Recent Labs    07/29/21 0337  HGBA1C 6.2*    CBG: Recent Labs  Lab 07/30/21 1653 07/30/21 1938 07/31/21 0016 07/31/21 0421 07/31/21 0754   GLUCAP 116* 142* 107* 93 91    Lipid Profile: No results for input(s): CHOL, HDL, LDLCALC, TRIG, CHOLHDL, LDLDIRECT in the last 72 hours. Thyroid Function Tests: Recent Labs    07/29/21 0337  TSH 1.511    Anemia Panel: No results for input(s): VITAMINB12, FOLATE, FERRITIN, TIBC, IRON, RETICCTPCT in the last 72 hours. Sepsis Labs: No results for input(s): PROCALCITON, LATICACIDVEN in the last 168 hours.  Recent Results (from the past 240 hour(s))  Resp Panel by RT-PCR (Flu A&B, Covid) Nasopharyngeal Swab     Status: None   Collection Time: 07/28/21  8:19 PM   Specimen: Nasopharyngeal Swab; Nasopharyngeal(NP) swabs in vial transport medium  Result Value Ref Range Status   SARS Coronavirus 2 by RT PCR NEGATIVE NEGATIVE Final    Comment: (NOTE) SARS-CoV-2 target nucleic acids are NOT DETECTED.  The SARS-CoV-2 RNA is generally detectable in upper respiratory specimens during the acute phase of infection. The lowest concentration of SARS-CoV-2 viral copies this assay can detect is 138 copies/mL. A negative result does not preclude SARS-Cov-2 infection and should not be used as the sole basis for treatment or other patient management decisions. A negative result may occur with  improper specimen collection/handling, submission of specimen other than nasopharyngeal swab, presence of viral mutation(s) within the areas targeted by this assay, and inadequate number of viral copies(<138 copies/mL). A negative result must be combined with clinical observations, patient history, and epidemiological information. The expected result is Negative.  Fact Sheet for Patients:  EntrepreneurPulse.com.au  Fact Sheet for Healthcare Providers:  IncredibleEmployment.be  This test is no t yet approved or cleared by the [place] FDA and  has been authorized for detection and/or diagnosis of SARS-CoV-2 by FDA under an Emergency Use Authorization (EUA). This  EUA will remain  in effect (meaning this test can be used) for the duration of the COVID-19 declaration under Section 564(b)(1) of the Act, 21 U.S.C.section 360bbb-3(b)(1), unless the authorization is terminated  or revoked sooner.       Influenza A by PCR NEGATIVE NEGATIVE Final   Influenza B by PCR NEGATIVE NEGATIVE Final    Comment: (NOTE) The Xpert Xpress SARS-CoV-2/FLU/RSV plus assay is intended as an aid in the diagnosis of influenza from Nasopharyngeal swab specimens and should not be used as a sole basis for treatment. Nasal washings and aspirates are unacceptable for Xpert Xpress SARS-CoV-2/FLU/RSV testing.  Fact Sheet for Patients: EntrepreneurPulse.com.au  Fact Sheet for Healthcare Providers: IncredibleEmployment.be  This test is not yet approved or cleared by the [place] FDA and has been authorized for detection and/or diagnosis of SARS-CoV-2 by FDA under  an Emergency Use Authorization (EUA). This EUA will remain in effect (meaning this test can be used) for the duration of the COVID-19 declaration under Section 564(b)(1) of the Act, 21 U.S.C. section 360bbb-3(b)(1), unless the authorization is terminated or revoked.  Performed at [facility], [place] [address], [place], [place] [address]      Radiology Studies: CARDIAC CATHETERIZATION  Addendum Date: 07/29/2021   Right dominant circulation Tortuous vessels with no significant coronary artery disease Normal LVEDP Nigel Mormon, MD Pager: [phone] Office: [phone]   Result Date: 07/29/2021 Images from the original result were not included. Right dominant circulation Tortuous vessels with no significant coronary artery disease Normal LVEDP Nigel Mormon, MD Pager: [phone] Office: [phone]    Scheduled Meds:  amiodarone  100 mg Oral Daily   apixaban  5 mg Oral BID   feeding supplement  237 mL Oral TID BM   insulin aspart  0-9 Units  Subcutaneous Q4H   metoprolol tartrate  25 mg Oral BID   multivitamin with minerals  1 tablet Oral Daily   pantoprazole  40 mg Oral Daily   rosuvastatin  5 mg Oral Daily   sodium chloride flush  3 mL Intravenous Q12H   Continuous Infusions:  sodium chloride Stopped (07/29/21 0901)   sodium chloride       LOS: 3 days    Time spent: 30 mins    Darliss Cheney, MD [facility]   If 7PM-7AM, please contact night-coverage

## 2021-08-01 DIAGNOSIS — G9341 Metabolic encephalopathy: Secondary | ICD-10-CM | POA: Diagnosis not present

## 2021-08-01 DIAGNOSIS — N179 Acute kidney failure, unspecified: Secondary | ICD-10-CM | POA: Diagnosis not present

## 2021-08-01 LAB — BASIC METABOLIC PANEL
Anion gap: 6 (ref 5–15)
BUN: 18 mg/dL (ref 8–23)
CO2: 32 mmol/L (ref 22–32)
Calcium: 8.4 mg/dL — ABNORMAL LOW (ref 8.9–10.3)
Chloride: 96 mmol/L — ABNORMAL LOW (ref 98–111)
Creatinine, Ser: 0.85 mg/dL (ref 0.44–1.00)
GFR, Estimated: >60 mL/min (ref >60)
Glucose, Bld: 96 mg/dL (ref 70–99)
Potassium: 4.3 mmol/L (ref 3.5–5.1)
Sodium: 134 mmol/L — ABNORMAL LOW (ref 135–145)

## 2021-08-01 LAB — GLUCOSE, CAPILLARY
Glucose-Capillary: 106 mg/dL — ABNORMAL HIGH (ref 70–99)
Glucose-Capillary: 115 mg/dL — ABNORMAL HIGH (ref 70–99)
Glucose-Capillary: 122 mg/dL — ABNORMAL HIGH (ref 70–99)
Glucose-Capillary: 134 mg/dL — ABNORMAL HIGH (ref 70–99)
Glucose-Capillary: 136 mg/dL — ABNORMAL HIGH (ref 70–99)
Glucose-Capillary: 98 mg/dL (ref 70–99)

## 2021-08-01 MED ORDER — AMIODARONE HCL 100 MG PO TABS: 100.0000 mg | ORAL_TABLET | Freq: Every day | ORAL | 0 refills | Status: DC

## 2021-08-01 MED ORDER — SPIRONOLACTONE 25 MG PO TABS: 12.5000 mg | ORAL_TABLET | Freq: Every day | ORAL | 0 refills | Status: DC

## 2021-08-01 MED ORDER — METOPROLOL TARTRATE 25 MG PO TABS: 25.0000 mg | ORAL_TABLET | Freq: Two times a day (BID) | ORAL | 0 refills | Status: DC

## 2021-08-01 NOTE — Care Management (Addendum)
9323 08-01-21 Case Manager received notification for discharge with home health services. Case Manager spoke with patient this am regarding home health services. Prior to arrival patient was living with her son and he works during the day. The granddaughter had been staying with her during the day; however, going forward she will not be able to. Patient asked Case Manager to reach out to her niece Ashley [name] who is making the decisions for the patient. Ashley [name] states the patient needs to go to SNF due to deconditioning and would benefit from rehab. Case Manager discussed Ashley [name]'s thoughts with the patient and the patient is now agreeable to SNF. Information relayed to staff RN, MD, and CSW. Physical Therapy will need to re-consult for further recommendations. No further needs from Case Manager at this time.    5573 08-01-21 Case Manager also discussed with Ashley [name] the option of getting personal care services in the home for the patient. Family is aware that this is an out of pocket fee ranging from $18.00-26.00 an hour. Family is also aware that this non-skilled service would have to be arranged via family No further needs at this time.

## 2021-08-01 NOTE — [place] FL2 (Signed)
[place] MEDICAID FL2 LEVEL OF CARE SCREENING TOOL     IDENTIFICATION  Patient Name: Ashley [name] Birthdate: 09-30-1945 Sex: female Admission Date (Current Location): 07/28/2021  [facility] and [place] Number:  Producer, television/film/video and Address:  [facility] [place]. [facility], 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)    History of stroke    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   Chronic HFrEF (heart failure with reduced ejection fraction) (HCC) 07/29/2021   Anasarca 07/29/2021   Protein-calorie malnutrition, severe 07/29/2021   Non-STEMI (non-ST elevated myocardial infarction) (HCC)    Sinus pause    Deep vein thrombosis (DVT) of femoral vein of right lower extremity (HCC)    Failure to thrive in adult 07/28/2021   Digitalis toxicity 07/28/2021   Pulmonary embolus (HCC)    Atrial fibrillation with RVR (HCC) 06/20/2021   Paroxysmal atrial fibrillation (HCC) 06/19/2021   AKI (acute kidney injury) (HCC) 06/19/2021   Microcytosis 06/19/2021   Cerebral embolism with cerebral infarction 04/05/2020   Bradycardia 04/04/2020   Acute metabolic encephalopathy 04/04/2020   Elevated troponin 04/04/2020   Essential hypertension 11/23/2018   Controlled diabetes mellitus type II without complication (HCC) 11/23/2018   Obesity 11/23/2018    Orientation RESPIRATION BLADDER Height & Weight     Self, Time, Situation, Place  O2 (2l)  Continent Weight: 229 lb 15 oz (104.3 kg) Height:  5\' 8"  (172.7 cm)  BEHAVIORAL SYMPTOMS/MOOD NEUROLOGICAL BOWEL NUTRITION STATUS      Continent Diet (See DC summary)  AMBULATORY STATUS COMMUNICATION OF NEEDS Skin   Limited Assist Verbally Other (Comment) (ecchymosis arm bi-lateral)                       Personal Care Assistance Level of Assistance  Bathing, Feeding, Dressing Bathing Assistance: Limited assistance Feeding assistance: Independent Dressing Assistance: Limited assistance     Functional Limitations Info  Sight, Hearing, Speech Sight Info: Impaired Hearing Info: Adequate Speech Info: Adequate    SPECIAL CARE FACTORS FREQUENCY  PT (By licensed PT), OT (By licensed OT)     PT Frequency: 5x per week OT Frequency: 5x per week            Contractures Contractures Info: Not present    Additional Factors Info  Code Status, Insulin Sliding Scale, Allergies Code Status Info: Full Allergies Info: Bystolic (Nebivolol Hcl), Coreg (Carvedilol), Minoxidil   Insulin Sliding Scale Info: See DC summary       Current Medications (08/01/2021):  This is the current hospital active medication list Current Facility-Administered Medications  Medication Dose Route Frequency Provider Last Rate Last Admin   0.9 %  sodium chloride infusion   Intravenous PRN 08/03/2021, MD   Stopped at 07/29/21 0901   0.9 %  sodium chloride infusion  250 mL Intravenous PRN Patwardhan, Manish J, MD       acetaminophen (TYLENOL) tablet 650 mg  650 mg Oral Q4H PRN Patwardhan, Manish J, MD       amiodarone (PACERONE) tablet 100 mg  100 mg Oral Daily Tolia, Sunit, DO   100 mg at 08/01/21 1008   apixaban (ELIQUIS) tablet 5 mg  5 mg Oral BID Patwardhan, Manish J, MD   5 mg at 08/01/21 1008   feeding supplement (ENSURE ENLIVE / ENSURE PLUS) liquid 237 mL  237 mL Oral TID BM Dahal, Marlowe Aschoff, MD   237 mL at 08/01/21 1009   HYDROcodone-acetaminophen (NORCO/VICODIN) 5-325 MG per tablet 1-2 tablet   1-2 tablet Oral Q4H PRN Doutova, Anastassia, MD       insulin aspart (novoLOG) injection 0-9 Units  0-9 Units Subcutaneous Q4H Doutova, Anastassia, MD   1 Units at 08/01/21 1200   ketotifen (ZADITOR) 0.025 % ophthalmic solution 1 drop  1 drop Both Eyes BID Pahwani, Einar Grad, MD   1 drop at 08/01/21 1009   metoprolol tartrate (LOPRESSOR) tablet 25 mg  25 mg Oral BID Darliss Cheney, MD   25 mg at 08/01/21 1008   multivitamin with minerals tablet 1 tablet  1 tablet Oral Daily Dahal, Binaya, MD   1 tablet at 08/01/21 1008   pantoprazole (PROTONIX) EC tablet 40 mg  40 mg Oral Daily Doutova, Anastassia, MD   40 mg at 08/01/21 1008   rosuvastatin (CRESTOR) tablet 5 mg  5 mg Oral Daily Doutova, Anastassia, MD   5 mg at 08/01/21 1008   sodium chloride flush (NS) 0.9 % injection 3 mL  3 mL Intravenous Q12H Patwardhan, Manish J, MD   3 mL at 08/01/21 1011   sodium chloride flush (NS) 0.9 % injection 3 mL  3 mL Intravenous PRN Patwardhan, Reynold Bowen, MD         Discharge Medications: Please see discharge summary for a list of discharge medications.  Relevant Imaging Results:  Relevant Lab Results:   Additional Information SSN# [id number] vaccinated and had one booster  Bary Castilla, LCSW

## 2021-08-01 NOTE — Progress Notes (Signed)
Occupational Therapy Treatment Patient Details Name: Ashley [name] MRN: [id number] DOB: 10/15/1945 Today's Date: 08/01/2021   History of present illness Ashley [name] is a 75 y.o. female presented with progressive weakness, anorexia, foul taste in her mouth, debility. At first was able to participate with PT/OT but now unable. Found to have digitalis toxicity,hypokalemia, and non-STEMI, left heart cath 11/10. PHMx: DM-2, HTN, HLD,CVA, atrial tachycardia; last month was admitted with A. fib with RVR-underwent a TEE which was complicated by severe hypoxia/hypotension-intermittent PEA-requiring emergent intubation and transfer to the ICU,CTA chest showed submassive PE-she was subsequently given systemic tPA. Pt was extubated on 10/4, acute DVT right common femoral vein, age-indeterminate DVT involving left posterior tibial/left peroneal vein   OT comments  Pt making progress towards OT goals this session, dove-tailed session with PT, and focused on energy conservation education as well as transfers for toileting and OOB activity as well as LB dressing (please see ADL section below and transfer section below). Niece and nephew present (who provide her care at home) and 100% supportive for short term SNF to maximize safety and independence in ADL and functional transfers. Pt also demonstrating some deficits in cognition this session, did not remember OT coming by earlier today, required multimodal cues for problem solving, increased processing time required for movements today. OT will continue to follow acutely however recommending SNF post-acute to increase frequency of therapy post-acute and maximize independence and safety.    Recommendations for follow up therapy are one component of a multi-disciplinary discharge planning process, led by the attending physician.  Recommendations may be updated based on patient status, additional functional criteria and insurance authorization.    Follow  Up Recommendations  Skilled nursing-short term rehab (<3 hours/day)    Assistance Recommended at Discharge Intermittent Supervision/Assistance  Equipment Recommendations  None recommended by OT    Recommendations for Other Services      Precautions / Restrictions Precautions Precautions: Fall Restrictions Weight Bearing Restrictions: No       Mobility Bed Mobility Overal bed mobility: Needs Assistance Bed Mobility: Supine to Sit     Supine to sit: Supervision;HOB elevated     General bed mobility comments: increased time, but able to complete with bed rails and controls.    Transfers Overall transfer level: Needs assistance Equipment used: 1 person hand held assist Transfers: Sit to/from Stand;Bed to chair/wheelchair/BSC Sit to Stand: Mod assist;Min assist   Step pivot transfers: Min assist       General transfer comment: Pt initially coming to stand from EOB with posterior LOB, needing modA to recover and sit safely on EOB. On second rep, pt requiring minA HHA for stability. Stand step to R with pt transitioning hands from bed to recliner proximal to her with HHA as needed, minA to stabilize.     Balance Overall balance assessment: Needs assistance Sitting-balance support: No upper extremity supported;Feet supported Sitting balance-Leahy Scale: Good     Standing balance support: Bilateral upper extremity supported Standing balance-Leahy Scale: Poor Standing balance comment: Reliant on UE support and minA for standing balance.                           ADL either performed or assessed with clinical judgement   ADL Overall ADL's : Needs assistance/impaired Eating/Feeding: Independent;Sitting Eating/Feeding Details (indicate cue type and reason): eating lunch at the end of session Grooming: Wash/dry hands;Wash/dry face;Set up;Sitting Grooming Details (indicate cue type and reason): in recliner  Upper Body Dressing : Set up;Sitting Upper  Body Dressing Details (indicate cue type and reason): to don second gown like robe Lower Body Dressing: Sitting/lateral leans;Minimal assistance Lower Body Dressing Details (indicate cue type and reason): to don socks, extra time required vc for problem solving, Toilet Transfer: Moderate assistance;Stand-pivot;BSC/3in1 Toilet Transfer Details (indicate cue type and reason): face to face with Pt, unable to achieve on 1st attempt, required mod A Toileting- Clothing Manipulation and Hygiene: Moderate assistance Toileting - Clothing Manipulation Details (indicate cue type and reason): min A sit<>stand     Functional mobility during ADLs: Minimal assistance;Rolling walker (2 wheels) (SPT only, cues for safety with RW)      Extremity/Trunk Assessment Upper Extremity Assessment Upper Extremity Assessment: Generalized weakness   Lower Extremity Assessment Lower Extremity Assessment: Defer to PT evaluation        Vision       Perception     Praxis      Cognition Arousal/Alertness: Awake/alert Behavior During Therapy: WFL for tasks assessed/performed Overall Cognitive Status: Within Functional Limits for tasks assessed                                            Exercises     Shoulder Instructions       General Comments VSS on RA; pt agreeable to SNF family who has been caring for her present and sharing of recent decline - staying in recliner, and 100% on board for SNF rehab    Pertinent Vitals/ Pain       Pain Assessment: Faces Faces Pain Scale: No hurt Pain Intervention(s): Monitored during session;Repositioned  Home Living                                          Prior Functioning/Environment              Frequency  Min 2X/week        Progress Toward Goals  OT Goals(current goals can now be found in the care plan section)  Progress towards OT goals: Progressing toward goals  Acute Rehab OT Goals Patient Stated Goal:  get stronger and then go home OT Goal Formulation: With patient/family Time For Goal Achievement: 08/13/21 Potential to Achieve Goals: Good  Plan Discharge plan needs to be updated;Frequency remains appropriate    Co-evaluation                 AM-PAC OT "6 Clicks" Daily Activity     Outcome Measure   Help from another person eating meals?: None Help from another person taking care of personal grooming?: A Little Help from another person toileting, which includes using toliet, bedpan, or urinal?: A Lot Help from another person bathing (including washing, rinsing, drying)?: A Little Help from another person to put on and taking off regular upper body clothing?: A Little Help from another person to put on and taking off regular lower body clothing?: A Little 6 Click Score: 18    End of Session Equipment Utilized During Treatment: Gait belt;Rolling walker (2 wheels)  OT Visit Diagnosis: Unsteadiness on feet (R26.81);Other abnormalities of gait and mobility (R26.89);Muscle weakness (generalized) (M62.81)   Activity Tolerance Patient tolerated treatment well   Patient Left in chair;with call bell/phone within reach;with chair alarm set;with family/visitor present  Nurse Communication Mobility status (needs new purewick)        Time: 1211-1224 OT Time Calculation (min): 13 min  Charges: OT General Charges $OT Visit: 1 Visit OT Treatments $Self Care/Home Management : 8-22 mins  [name] OTR/L Acute Rehabilitation Services Pager: [phone] Office: [phone]  Evern Bio Ashley [name] 08/01/2021, 2:43 PM

## 2021-08-01 NOTE — Progress Notes (Signed)
PROGRESS NOTE    Ashley [name]  [id number] DOB: [birth year] DOA: 07/28/2021 PCP: Deland Pretty, MD   Brief Narrative: This 75 y.o. female with PMH significant for DM2, HTN, HLD, CVA, atrial tachycardia on digoxin. She presented to the ED  with progressive weakness, anorexia,  taste changes in her mouth. She was recently hospitalized from 10/1-10/9 with A. fib with RVR, underwent TEE which was complicated by hypotension, hypoxia, intermittent PEA, patient required emergent intubation and transferred to ICU.  TEE was limited but it showed severe RV dysfunction.  Subsequent CT angio of chest showed submassive PE.  She was given systemic tPA after which she clinically improved.  She was successfully extubated.  DVT scan of legs showed acute DVT of right common femoral vein and left posterior tibial/left peroneal vein.  At discharge, she was kept on amiodarone, digoxin, metoprolol and Eliquis.   In the ED Her Digoxin level was noted to be high at 4 ng/mL along with low potassium of 3.4, elevated creatinine.   Patient was admitted for digoxin toxicity, hypokalemia and AKI.  Cardiology was consulted.  Patient underwent left heart cath.11/10. LHC showed right dominant circulation, tortuous vessels with no coronary artery disease.  Cardiology recommended no indication for anti platelet therapy.   Assessment & Plan:   Active Problems:   Essential hypertension   Controlled diabetes mellitus type II without complication (HCC)   Acute metabolic encephalopathy   Elevated troponin   Paroxysmal atrial fibrillation (HCC)   AKI (acute kidney injury) ([place])   Pulmonary embolus (HCC)   Failure to thrive in adult   Digitalis toxicity   QT prolongation   Chronic HFrEF (heart failure with reduced ejection fraction) (HCC)   Anasarca   Non-STEMI (non-ST elevated myocardial infarction) (HCC)   Sinus pause   Deep vein thrombosis (DVT) of femoral vein of right lower extremity (HCC)   Protein-calorie  malnutrition, severe   Long term (current) use of anticoagulants   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   EKG showed a ventricular rhythm.   Cardiology was consulted, patient was given DigiFab.  Electrolytes corrected. Continue telemetry monitoring.  Digoxin level coming down.   Hypokalemia: 4.3 today.  Resolved.  We will keep over 4.   Paroxysmal A. Fib: Patient was on digoxin 250 mcg daily, amiodarone 200 mg daily, metoprolol 100 mg twice daily. Cardiology consulted.  Currently on amiodarone 100 mg p.o. daily and metoprolol 50 mg p.o. twice daily.  Patient had 2.6-second pause the night before last night and was having bradycardia arrhythmia as well.  Lopressor reduced to 25 mg twice daily.  Now heart rate controlled. Continue anticoagulation with Eliquis 5 mg twice daily.  NSTEMI: Troponin elevated to 800, gradually trending up. Cardiology consulted, underwent left heart cath 11/10. Patient was on heparin which was discontinued. Left heart cath showed right dominant circulation with tortious vessels without coronary artery disease.   No indication for antiplatelet therapy.   Acute kidney injury: > Improved Baseline serum creatinine less than 1, serum creatinine peaked to 1.5 Renal function back to baseline.  Acute metabolic encephalopathy: > Improved. Multifactorial, digoxin toxicity, AKI, poor p.o. intake Currently alert and oriented.  History of pulmonary embolism: Continue Eliquis   Chronic systolic CHF: Appears euvolemic. Continue home medications.   HCTZ 12.5 daily, torsemide 20 mg daily, losartan  12.5 mg daily, Aldactone 25 mg daily Last echocardiogram LVEF 45%.   Type 2 diabetes melitis: Last hemoglobin A1c 6.2. Seems diet controlled at home. Continue regular insulin sliding  scale.   Hypoalbuminemia/failure to thrive Albumin low at 2.5 Dietitian consult.  Hyperlipidemia Continue Crestor  Viral conjunctivitis: Continue antihistamine eyedrops.  Disposition: Previously PT OT had recommended discharging home with home health although they had recommended 24/7 care which was presumably available for the patient at that point in time however after TOC talk to her niece Threasa Beards (her niece who lives with her ) About potential discharge plan today, she is no longer able to provide 24/7 and is requesting SNF discharge.  Patient agreeable as well.  PT reconsulted to assess patient.  DVT prophylaxis: Eliquis Code Status: Full code Family Communication: No family at bedside Disposition Plan:   Status is: Inpatient  Remains inpatient appropriate because: Still having sinus pauses with bradycardia.  Consultants:  Cardiology  Procedures: LHC  antimicrobials:   Anti-infectives (From admission, onward)    None        Subjective:  Patient seen and examined.  Still complains of burning eyes in both eyes with no vision changes.  Objective: Vitals:   07/31/21 2023 08/01/21 0000 08/01/21 0400 08/01/21 1000  BP: (!) 148/68 132/65 111/60 133/73  Pulse: 86 80 86 72  Resp: 19 17 17 20   Temp: 98 F (36.7 C) 98.1 F (36.7 C) 98.1 F (36.7 C)   TempSrc: Oral Oral Oral   SpO2: 92% 99% 98%   Weight:      Height:        Intake/Output Summary (Last 24 hours) at 08/01/2021 1106 Last data filed at 08/01/2021 0400 Gross per 24 hour  Intake 480 ml  Output --  Net 480 ml    Filed Weights   07/29/21 1423 07/30/21 0012 07/31/21 0401  Weight: 102.5 kg 102.9 kg 104.3 kg    Examination:  General exam: Appears calm and comfortable, bilateral conjunctival injection. Respiratory system: Clear to auscultation. Respiratory effort normal. Cardiovascular system: S1 & S2 heard, RRR. No JVD, murmurs, rubs, gallops or clicks. No pedal edema. Gastrointestinal system:  Abdomen is nondistended, soft and nontender. No organomegaly or masses felt. Normal bowel sounds heard. Central nervous system: Alert and oriented. No focal neurological deficits. Extremities: Symmetric 5 x 5 power. Skin: No rashes, lesions or ulcers.  Psychiatry: Judgement and insight appear normal. Mood & affect appropriate.   Data Reviewed: I have personally reviewed following labs and imaging studies  CBC: Recent Labs  Lab 07/28/21 1230 07/29/21 0337 07/30/21 0445 07/31/21 0522  WBC 9.4 9.0 7.1 6.6  NEUTROABS  --  6.0 3.8  --   HGB 14.6 13.1 12.1 11.3*  HCT 45.1 40.0 37.0 35.3*  MCV 71.2* 72.2* 72.0* 72.9*  PLT 159 128* PLATELET CLUMPS NOTED ON SMEAR, COUNT APPEARS DECREASED 103*    Basic Metabolic Panel: Recent Labs  Lab 07/28/21 2140 07/29/21 0337 07/29/21 0813 07/29/21 1304 07/30/21 0445 07/30/21 1750 07/31/21 0522 08/01/21 0405  NA 139 137   < > 138 138 137 137 134*  K 3.4* 3.6   < > 3.3* 3.4* 4.0 3.8 4.3  CL 93* 95*   < > 95* 98 97* 99 96*  CO2 34* 34*   < > 34* 34* 33* 32 32  GLUCOSE 100* 110*   < > 105* 90 117* 99 96  BUN 43* 43*   < > 39* 33* 28* 24* 18  CREATININE 1.36* 1.30*   < >  1.18* 1.03* 0.97 0.85 0.85  CALCIUM 8.8* 8.7*   < > 8.6* 8.5* 8.8* 8.6* 8.4*  MG 1.9 2.0  --   --   --  2.0 2.0  --   PHOS 3.6 3.3  --   --   --   --  1.6*  --    < > = values in this interval not displayed.    GFR: Estimated Creatinine Clearance: 72.3 mL/min (by C-G formula based on SCr of 0.85 mg/dL). Liver Function Tests: Recent Labs  Lab 07/28/21 2140 07/29/21 0337  AST 61* 60*  ALT 23 22  ALKPHOS 68 67  BILITOT 1.3* 0.9  PROT 6.4* 6.2*  ALBUMIN 2.7* 2.5*    No results for input(s): LIPASE, AMYLASE in the last 168 hours. No results for input(s): AMMONIA in the last 168 hours. Coagulation Profile: No results for input(s): INR, PROTIME in the last 168 hours. Cardiac Enzymes: Recent Labs  Lab 07/28/21 2140  CKTOTAL 160    BNP (last 3 results) No  results for input(s): PROBNP in the last 8760 hours. HbA1C: No results for input(s): HGBA1C in the last 72 hours.  CBG: Recent Labs  Lab 07/31/21 1543 07/31/21 2021 08/01/21 0046 08/01/21 0426 08/01/21 0737  GLUCAP 84 127* 122* 106* 98    Lipid Profile: No results for input(s): CHOL, HDL, LDLCALC, TRIG, CHOLHDL, LDLDIRECT in the last 72 hours. Thyroid Function Tests: No results for input(s): TSH, T4TOTAL, FREET4, T3FREE, THYROIDAB in the last 72 hours.  Anemia Panel: No results for input(s): VITAMINB12, FOLATE, FERRITIN, TIBC, IRON, RETICCTPCT in the last 72 hours. Sepsis Labs: No results for input(s): PROCALCITON, LATICACIDVEN in the last 168 hours.  Recent Results (from the past 240 hour(s))  Resp Panel by RT-PCR (Flu A&B, Covid) Nasopharyngeal Swab     Status: None   Collection Time: 07/28/21  8:19 PM   Specimen: Nasopharyngeal Swab; Nasopharyngeal(NP) swabs in vial transport medium  Result Value Ref Range Status   SARS Coronavirus 2 by RT PCR NEGATIVE NEGATIVE Final    Comment: (NOTE) SARS-CoV-2 target nucleic acids are NOT DETECTED.  The SARS-CoV-2 RNA is generally detectable in upper respiratory specimens during the acute phase of infection. The lowest concentration of SARS-CoV-2 viral copies this assay can detect is 138 copies/mL. A negative result does not preclude SARS-Cov-2 infection and should not be used as the sole basis for treatment or other patient management decisions. A negative result may occur with  improper specimen collection/handling, submission of specimen other than nasopharyngeal swab, presence of viral mutation(s) within the areas targeted by this assay, and inadequate number of viral copies(<138 copies/mL). A negative result must be combined with clinical observations, patient history, and epidemiological information. The expected result is Negative.  Fact Sheet for Patients:  EntrepreneurPulse.com.au  Fact Sheet for  Healthcare Providers:  IncredibleEmployment.be  This test is no t yet approved or cleared by the [place] FDA and  has been authorized for detection and/or diagnosis of SARS-CoV-2 by FDA under an Emergency Use Authorization (EUA). This EUA will remain  in effect (meaning this test can be used) for the duration of the COVID-19 declaration under Section 564(b)(1) of the Act, 21 U.S.C.section 360bbb-3(b)(1), unless the authorization is terminated  or revoked sooner.       Influenza A by PCR NEGATIVE NEGATIVE Final   Influenza B by PCR NEGATIVE NEGATIVE Final    Comment: (NOTE) The Xpert Xpress SARS-CoV-2/FLU/RSV plus assay is intended as an aid in the diagnosis of influenza  from Nasopharyngeal swab specimens and should not be used as a sole basis for treatment. Nasal washings and aspirates are unacceptable for Xpert Xpress SARS-CoV-2/FLU/RSV testing.  Fact Sheet for Patients: BloggerCourse.com  Fact Sheet for Healthcare Providers: SeriousBroker.it  This test is not yet approved or cleared by the [place] FDA and has been authorized for detection and/or diagnosis of SARS-CoV-2 by FDA under an Emergency Use Authorization (EUA). This EUA will remain in effect (meaning this test can be used) for the duration of the COVID-19 declaration under Section 564(b)(1) of the Act, 21 U.S.C. section 360bbb-3(b)(1), unless the authorization is terminated or revoked.  Performed at [facility], [address], [place], [place] [address]      Radiology Studies: No results found.   Scheduled Meds:  amiodarone  100 mg Oral Daily   apixaban  5 mg Oral BID   feeding supplement  237 mL Oral TID BM   insulin aspart  0-9 Units Subcutaneous Q4H   ketotifen  1 drop Both Eyes BID   metoprolol tartrate  25 mg Oral BID   multivitamin with minerals  1 tablet Oral Daily   pantoprazole  40 mg Oral Daily    rosuvastatin  5 mg Oral Daily   sodium chloride flush  3 mL Intravenous Q12H   Continuous Infusions:  sodium chloride Stopped (07/29/21 0901)   sodium chloride       LOS: 4 days    Time spent: 33 mins  Hughie Closs, MD [facility]   If 7PM-7AM, please contact night-coverage

## 2021-08-01 NOTE — Progress Notes (Signed)
Physical Therapy Treatment Patient Details Name: Ashley [name] MRN: [id number] DOB: 11/18/1945 Today's Date: 08/01/2021   History of Present Illness Ashley [name] is a 75 y.o. female presented with progressive weakness, anorexia, foul taste in her mouth, debility. At first was able to participate with PT/OT but now unable. Found to have digitalis toxicity,hypokalemia, and non-STEMI, left heart cath 11/10. PHMx: DM-2, HTN, HLD,CVA, atrial tachycardia; last month was admitted with A. fib with RVR-underwent a TEE which was complicated by severe hypoxia/hypotension-intermittent PEA-requiring emergent intubation and transfer to the ICU,CTA chest showed submassive PE-she was subsequently given systemic tPA. Pt was extubated on 10/4, acute DVT right common femoral vein, age-indeterminate DVT involving left posterior tibial/left peroneal vein    PT Comments    Pt continues to be only mobilizing a few feet with need for UE support and minA this date. Pt even required modA at one point when transferring sit > stand due to a posterior LOB. Pt is at risk for falls and will not have 24/7 assistance as was reported by pt during her PT eval. Pt would be unsafe to attempt to mobilize alone at home. In addition, pt reports her functional mobility tolerance/endurance and balance have been progressively getting worse. Due to these reasons, updated d/c recs to SNF for short-term rehab, family and pt in agreement. Will continue to follow acutely.    Recommendations for follow up therapy are one component of a multi-disciplinary discharge planning process, led by the attending physician.  Recommendations may be updated based on patient status, additional functional criteria and insurance authorization.  Follow Up Recommendations  Skilled nursing-short term rehab (<3 hours/day)     Assistance Recommended at Discharge Frequent or constant Supervision/Assistance  Equipment Recommendations  None recommended  by PT    Recommendations for Other Services       Precautions / Restrictions Precautions Precautions: Fall Restrictions Weight Bearing Restrictions: No     Mobility  Bed Mobility Overal bed mobility: Needs Assistance Bed Mobility: Supine to Sit     Supine to sit: Supervision;HOB elevated     General bed mobility comments: increased time, but able to complete with bed rails and controls.    Transfers Overall transfer level: Needs assistance Equipment used: 1 person hand held assist Transfers: Sit to/from Stand;Bed to chair/wheelchair/BSC Sit to Stand: Mod assist;Min assist     Step pivot transfers: Min assist     General transfer comment: Pt initially coming to stand from EOB with posterior LOB, needing modA to recover and sit safely on EOB. On second rep, pt requiring minA HHA for stability. Stand step to R with pt transitioning hands from bed to recliner proximal to her with HHA as needed, minA to stabilize.    Ambulation/Gait Ambulation/Gait assistance: Min assist Gait Distance (Feet): 3 Feet Assistive device: 1 person hand held assist Gait Pattern/deviations: Decreased step length - right;Decreased step length - left;Shuffle;Trunk flexed;Step-through pattern Gait velocity: slowed Gait velocity interpretation: <1.31 ft/sec, indicative of household ambulator   General Gait Details: Pt alternating hand placement bed > recliner with intermittent HHA to stand step transfer bed > recliner, minA for stability as pt displayed trunk sway and short, slow shuffling steps.   Stairs             Wheelchair Mobility    Modified Rankin (Stroke Patients Only)       Balance Overall balance assessment: Needs assistance Sitting-balance support: No upper extremity supported;Feet supported Sitting balance-Leahy Scale: Good     Standing  balance support: Bilateral upper extremity supported Standing balance-Leahy Scale: Poor Standing balance comment: Reliant on UE  support and minA for standing balance.                            Cognition Arousal/Alertness: Awake/alert Behavior During Therapy: WFL for tasks assessed/performed Overall Cognitive Status: Within Functional Limits for tasks assessed                                          Exercises      General Comments General comments (skin integrity, edema, etc.): VSS on RA; pt agreeable to SNF      Pertinent Vitals/Pain Pain Assessment: Faces Faces Pain Scale: No hurt Pain Intervention(s): Monitored during session    Home Living                          Prior Function            PT Goals (current goals can now be found in the care plan section) Acute Rehab PT Goals Patient Stated Goal: to get rehab short-term PT Goal Formulation: With patient/family Time For Goal Achievement: 08/13/21 Potential to Achieve Goals: Fair Progress towards PT goals: Progressing toward goals    Frequency    Min 2X/week      PT Plan Discharge plan needs to be updated;Frequency needs to be updated    Co-evaluation              AM-PAC PT "6 Clicks" Mobility   Outcome Measure  Help needed turning from your back to your side while in a flat bed without using bedrails?: A Little Help needed moving from lying on your back to sitting on the side of a flat bed without using bedrails?: A Little Help needed moving to and from a bed to a chair (including a wheelchair)?: A Little Help needed standing up from a chair using your arms (e.g., wheelchair or bedside chair)?: A Lot Help needed to walk in hospital room?: Total Help needed climbing 3-5 steps with a railing? : Total 6 Click Score: 13    End of Session   Activity Tolerance: Patient tolerated treatment well Patient left: in chair;with call bell/phone within reach;with family/visitor present   PT Visit Diagnosis: Unsteadiness on feet (R26.81);Other abnormalities of gait and mobility (R26.89);Muscle  weakness (generalized) (M62.81);Difficulty in walking, not elsewhere classified (R26.2);Adult, failure to thrive (R62.7)     Time: 1205-1213 PT Time Calculation (min) (ACUTE ONLY): 8 min  Charges:  $Therapeutic Activity: 8-22 mins                     Raymond Gurney, PT, DPT Acute Rehabilitation Services  Pager: [phone] Office: [phone]    Jewel Baize 08/01/2021, 1:39 PM

## 2021-08-01 NOTE — Plan of Care (Signed)

## 2021-08-01 NOTE — TOC Progression Note (Addendum)
Transition of Care [facility]) - Progression Note    Patient Details  Name: Ashley [name] MRN: [id number] Date of Birth: 02-12-1946  Transition of Care [facility]) CM/SW [place], LCSW Phone Number:[phone] 08/01/2021, 2:21 PM  Clinical Narrative:     CSW met with patient to discuss PT , Pt's son and pt's niece Ashley [name] to discuss the  recommendation of a SNF. They were aware that the recommendation had changed and in agreement with going to a ST SNF. CSW discussed the SNF process.CSW provided patient with medicare.gov rating list.  Family gave CSW permission to fax referrals out to local facilities. Family notified CSW that they did not want any referrals sent to 1 star facilities. Per family's request CSW faxed referrals to some 5 star facilities however CSW explained that it is harder to get approval into those facilities.CSW answered questions about the SNF process and the next steps in the process.  Pt wants niece Ashley [name] to make SNF decision [phone]. Pt has been vaccinated and received one booster.  TOC team will continue to assist with discharge planning needs.       Expected Discharge Plan: [place] (VS SNF PT to evaluate) Barriers to Discharge: Continued Medical Work up  Expected Discharge Plan and Services Expected Discharge Plan: [place] (VS SNF PT to evaluate)   Discharge Planning Services: CM Consult   Living arrangements for the past 2 months: Single Family Home Expected Discharge Date: 08/01/21                                     Social Determinants of Health (SDOH) Interventions    Readmission Risk Interventions No flowsheet data found.

## 2021-08-01 NOTE — TOC Progression Note (Signed)
Transition of Care [facility]) - Progression Note    Patient Details  Name: Ashley [name] MRN: [id number] Date of Birth: August 20, 1946  Transition of Care [facility]) CM/SW Contact  Patrice Paradise, LCSW Phone Number:[phone] 08/01/2021, 3:08 PM  Clinical Narrative:     CSW started authorization for start date of 08/02/2021 [facility] # [id number]. Facility would need to be given.  TOC team will continue to assist with discharge planning needs.   Expected Discharge Plan: Home w Home Health Services (VS SNF PT to evaluate) Barriers to Discharge: Continued Medical Work up  Expected Discharge Plan and Services Expected Discharge Plan: Home w Home Health Services (VS SNF PT to evaluate)   Discharge Planning Services: CM Consult   Living arrangements for the past 2 months: Single Family Home Expected Discharge Date: 08/01/21                                     Social Determinants of Health (SDOH) Interventions    Readmission Risk Interventions No flowsheet data found.

## 2021-08-02 DIAGNOSIS — E43 Unspecified severe protein-calorie malnutrition: Secondary | ICD-10-CM | POA: Diagnosis not present

## 2021-08-02 DIAGNOSIS — G9341 Metabolic encephalopathy: Secondary | ICD-10-CM | POA: Diagnosis not present

## 2021-08-02 DIAGNOSIS — I5022 Chronic systolic (congestive) heart failure: Secondary | ICD-10-CM | POA: Diagnosis not present

## 2021-08-02 DIAGNOSIS — I69398 Other sequelae of cerebral infarction: Secondary | ICD-10-CM | POA: Diagnosis not present

## 2021-08-02 DIAGNOSIS — R5381 Other malaise: Secondary | ICD-10-CM | POA: Diagnosis not present

## 2021-08-02 DIAGNOSIS — I480 Paroxysmal atrial fibrillation: Secondary | ICD-10-CM | POA: Diagnosis not present

## 2021-08-02 DIAGNOSIS — I214 Non-ST elevation (NSTEMI) myocardial infarction: Secondary | ICD-10-CM | POA: Diagnosis not present

## 2021-08-02 DIAGNOSIS — R2689 Other abnormalities of gait and mobility: Secondary | ICD-10-CM | POA: Diagnosis not present

## 2021-08-02 DIAGNOSIS — I82532 Chronic embolism and thrombosis of left popliteal vein: Secondary | ICD-10-CM | POA: Diagnosis not present

## 2021-08-02 DIAGNOSIS — I10 Essential (primary) hypertension: Secondary | ICD-10-CM | POA: Diagnosis not present

## 2021-08-02 DIAGNOSIS — R2681 Unsteadiness on feet: Secondary | ICD-10-CM | POA: Diagnosis not present

## 2021-08-02 DIAGNOSIS — R0989 Other specified symptoms and signs involving the circulatory and respiratory systems: Secondary | ICD-10-CM | POA: Diagnosis not present

## 2021-08-02 DIAGNOSIS — Z7401 Bed confinement status: Secondary | ICD-10-CM | POA: Diagnosis not present

## 2021-08-02 DIAGNOSIS — M2550 Pain in unspecified joint: Secondary | ICD-10-CM | POA: Diagnosis not present

## 2021-08-02 DIAGNOSIS — I69891 Dysphagia following other cerebrovascular disease: Secondary | ICD-10-CM | POA: Diagnosis not present

## 2021-08-02 DIAGNOSIS — R1312 Dysphagia, oropharyngeal phase: Secondary | ICD-10-CM | POA: Diagnosis not present

## 2021-08-02 DIAGNOSIS — E119 Type 2 diabetes mellitus without complications: Secondary | ICD-10-CM | POA: Diagnosis not present

## 2021-08-02 DIAGNOSIS — E876 Hypokalemia: Secondary | ICD-10-CM | POA: Diagnosis not present

## 2021-08-02 DIAGNOSIS — T460X1D Poisoning by cardiac-stimulant glycosides and drugs of similar action, accidental (unintentional), subsequent encounter: Secondary | ICD-10-CM | POA: Diagnosis not present

## 2021-08-02 DIAGNOSIS — N179 Acute kidney failure, unspecified: Secondary | ICD-10-CM | POA: Diagnosis not present

## 2021-08-02 DIAGNOSIS — I69828 Other speech and language deficits following other cerebrovascular disease: Secondary | ICD-10-CM | POA: Diagnosis not present

## 2021-08-02 DIAGNOSIS — M6281 Muscle weakness (generalized): Secondary | ICD-10-CM | POA: Diagnosis not present

## 2021-08-02 LAB — RESP PANEL BY RT-PCR (FLU A&B, COVID) ARPGX2
Influenza A by PCR: NEGATIVE
Influenza B by PCR: NEGATIVE
SARS Coronavirus 2 by RT PCR: NEGATIVE

## 2021-08-02 LAB — GLUCOSE, CAPILLARY
Glucose-Capillary: 100 mg/dL — ABNORMAL HIGH (ref 70–99)
Glucose-Capillary: 110 mg/dL — ABNORMAL HIGH (ref 70–99)
Glucose-Capillary: 114 mg/dL — ABNORMAL HIGH (ref 70–99)
Glucose-Capillary: 119 mg/dL — ABNORMAL HIGH (ref 70–99)
Glucose-Capillary: 124 mg/dL — ABNORMAL HIGH (ref 70–99)

## 2021-08-02 LAB — BASIC METABOLIC PANEL
Anion gap: 6 (ref 5–15)
BUN: 13 mg/dL (ref 8–23)
CO2: 32 mmol/L (ref 22–32)
Calcium: 8.5 mg/dL — ABNORMAL LOW (ref 8.9–10.3)
Chloride: 99 mmol/L (ref 98–111)
Creatinine, Ser: 0.8 mg/dL (ref 0.44–1.00)
GFR, Estimated: >60 mL/min (ref >60)
Glucose, Bld: 106 mg/dL — ABNORMAL HIGH (ref 70–99)
Potassium: 3.7 mmol/L (ref 3.5–5.1)
Sodium: 137 mmol/L (ref 135–145)

## 2021-08-02 LAB — MAGNESIUM: Magnesium: 1.9 mg/dL (ref 1.7–2.4)

## 2021-08-02 MED ORDER — MAGNESIUM SULFATE 2 GM/50ML IV SOLN
2.0000 g | Freq: Once | INTRAVENOUS | Status: AC
  Administered 2021-08-02: 2 g via INTRAVENOUS
  Filled 2021-08-02: qty 50

## 2021-08-02 MED ORDER — ALUM & MAG HYDROXIDE-SIMETH 200-200-20 MG/5ML PO SUSP
15.0000 mL | ORAL | Status: DC | PRN
  Administered 2021-08-02 (×2): 15 mL via ORAL
  Filled 2021-08-02: qty 30

## 2021-08-02 MED ORDER — POTASSIUM CHLORIDE CRYS ER 20 MEQ PO TBCR
40.0000 meq | EXTENDED_RELEASE_TABLET | Freq: Once | ORAL | Status: AC
  Administered 2021-08-02: 40 meq via ORAL
  Filled 2021-08-02: qty 2

## 2021-08-02 MED ORDER — KETOTIFEN FUMARATE 0.025 % OP SOLN: 1.0000 [drp] | Freq: Two times a day (BID) | OPHTHALMIC | 0 refills | Status: AC

## 2021-08-02 MED ORDER — ALUM & MAG HYDROXIDE-SIMETH 200-200-20 MG/5ML PO SUSP
15.0000 mL | ORAL | Status: DC | PRN
  Administered 2021-08-02: 15 mL via ORAL
  Filled 2021-08-02 (×2): qty 30

## 2021-08-02 NOTE — TOC Transition Note (Signed)
Transition of Care [facility]) - CM/SW Discharge Note   Patient Details  Name: Ashley [name] MRN: [id number] Date of Birth: 06-Nov-1945  Transition of Care [facility]) CM/SW Contact:  Lynett Grimes Phone Number: 08/02/2021, 12:07 PM   Clinical Narrative:    Patient will DC to: [facility]  Anticipated DC date: 08/02/2021 Family notified: Pt Son and Niece Transport by: Sharin Mons   Per MD patient ready for DC to [facility]. RN to call report prior to discharge [phone]). RN, patient, patient's family, and facility notified of DC. Discharge Summary and FL2 sent to facility. DC packet on chart. Ambulance transport requested for patient.   CSW will sign off for now as social work intervention is no longer needed. Please consult [place] again if new needs arise.       Barriers to Discharge: Continued Medical Work up   Patient Goals and CMS Choice        Discharge Placement                       Discharge Plan and Services   Discharge Planning Services: CM Consult                                 Social Determinants of Health (SDOH) Interventions     Readmission Risk Interventions No flowsheet data found.

## 2021-08-02 NOTE — Care Management Important Message (Signed)
Important Message  Patient Details  Name: Ashley [name] MRN: [id number] Date of Birth: [birth year]   Medicare Important Message Given:  Yes     Renie Ora 08/02/2021, 10:26 AM

## 2021-08-02 NOTE — Progress Notes (Signed)
Attempted to call report to [employer] twice. I left a message with Vella Kohler with my name and number to have the nurse call me for report.

## 2021-08-02 NOTE — Progress Notes (Signed)
Report givento Mo, Charity fundraiser at [employer].

## 2021-08-02 NOTE — Discharge Summary (Signed)
Physician Discharge Summary  [place] [id number] DOB: 06-13-1946 DOA: 07/28/2021  PCP: Deland Pretty, MD  Admit date: 07/28/2021 Discharge date: 08/02/2021 30 Day Unplanned Readmission Risk Score    Flowsheet Row ED to Hosp-Admission (Current) from 07/28/2021 in [facility]  30 Day Unplanned Readmission Risk Score (%) 18.48 Filed at 08/02/2021 0801       This score is the patient's risk of an unplanned readmission within 30 days of being discharged (0 -100%). The score is based on dignosis, age, lab data, medications, orders, and past utilization.   Low:  0-14.9   Medium: 15-21.9   High: 22-29.9   Extreme: 30 and above          Admitted From: Home Disposition: SNF  Recommendations for Outpatient Follow-up:  Follow up with PCP in 1-2 weeks Please obtain BMP/CBC in one week Follow-up with your primary cardiologist in 1 to 2 weeks Please follow up with your PCP on the following pending results: Unresulted Labs (From admission, onward)     Start     Ordered   08/02/21 1150  Resp Panel by RT-PCR (Flu A&B, Covid) Nasopharyngeal Swab  (Tier 2 - Symptomatic/asymptomatic)  Once,   R        08/02/21 [place]: None Equipment/Devices: None  Discharge Condition: Stable CODE STATUS: Full code Diet recommendation: Low-sodium  Subjective: Patient seen and examined.  She complains of dyspepsia.  Burning in the eyes is improving.  No other complaint.  Son at the bedside.  Brief/Interim Summary: This 75 y.o. female with PMH significant for DM2, HTN, HLD, CVA, atrial tachycardia on digoxin. She presented to the ED  with progressive weakness, anorexia,  taste changes.   She was recently hospitalized from 10/1-10/9 with A. fib with RVR, underwent TEE which was complicated by hypotension, hypoxia, intermittent PEA, patient required emergent intubation and transferred to ICU.  TEE was limited but it showed severe RV dysfunction.   Subsequent CT angio of chest showed submassive PE.  She was given systemic tPA after which she clinically improved.  She was successfully extubated.  DVT scan of legs showed acute DVT of right common femoral vein and left posterior tibial/left peroneal vein.  At discharge, she was kept on amiodarone, digoxin, metoprolol and Eliquis.   In the ED Her Digoxin level was noted to be high at 4 ng/mL along with low potassium of 3.4, elevated creatinine.   Patient was admitted for digoxin toxicity, hypokalemia and AKI.  Cardiology was consulted.  Patient underwent left heart cath.11/10. LHC showed right dominant circulation, tortuous vessels with no coronary artery disease.  Cardiology recommended no indication for anti platelet therapy.  Digitalis toxicity Cardiology was consulted, patient was given DigiFab.  Electrolytes corrected.  Levels improving.  Cardiology recommended discontinuing digoxin.   Hypokalemia: 3.8 today.  Cardiology recommended to keep it over 4.  Will replace today before discharge.   Paroxysmal A. Fib: Patient was on digoxin 250 mcg daily, amiodarone 200 mg daily, metoprolol 100 mg twice daily.  Currently on amiodarone 100 mg p.o. Lopressor 25 mg twice daily, her blood pressure and amiodarone was reduced due to bradycardia arrhythmia and short pauses.  Continue anticoagulation with Eliquis 5 mg twice daily.   NSTEMI: Troponin elevated to 800. Cardiology consulted, underwent left heart cath 11/10 which showed right dominant circulation with tortious vessels without coronary artery disease.  No indication for antiplatelet therapy.  Acute kidney injury: > Improved Baseline serum creatinine less than 1, serum creatinine peaked to 1.5 Renal function back to baseline.   Acute metabolic encephalopathy: > Improved. Multifactorial, digoxin toxicity, AKI, poor p.o. intake Currently alert and oriented.   History of pulmonary embolism: Continue Eliquis   Chronic systolic  CHF: Appears euvolemic. Continue following medications.   Type 2 diabetes melitis: Last hemoglobin A1c 6.2.  Diet controlled.  Viral conjunctivitis: Continue antihistamine eyedrops.   Disposition: PT OT recommended SNF.  This is arranged.  Patient is being discharged to SNF in stable condition.  Son at the bedside.  Plan of care discussed.  Discharge Diagnoses:  Active Problems:   Essential hypertension   Controlled diabetes mellitus type II without complication (HCC)   Acute metabolic encephalopathy   Elevated troponin   Paroxysmal atrial fibrillation (HCC)   AKI (acute kidney injury) ([place])   Pulmonary embolus (HCC)   Failure to thrive in adult   Digitalis toxicity   QT prolongation   Chronic HFrEF (heart failure with reduced ejection fraction) (HCC)   Anasarca   Non-STEMI (non-ST elevated myocardial infarction) (HCC)   Sinus pause   Deep vein thrombosis (DVT) of femoral vein of right lower extremity (HCC)   Protein-calorie malnutrition, severe   Long term (current) use of anticoagulants   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   amiodarone 100 MG tablet Commonly known as: PACERONE Take 1 tablet (100 mg total) by mouth daily. What changed:  medication strength how much to take how to take this when to take this additional  instructions   apixaban 5 MG Tabs tablet Commonly known as: ELIQUIS Take 1 tablet (5 mg total) by mouth 2 (two) times daily.   famotidine 20 MG tablet Commonly known as: PEPCID Take 1 tablet (20 mg total) by mouth daily.   losartan 25 MG tablet Commonly known as: Cozaar Take 0.5 tablets (12.5 mg total) by mouth daily.   metoprolol tartrate 25 MG tablet Commonly known as: LOPRESSOR Take 1 tablet (25 mg total) by mouth 2 (two) times daily. What changed:  medication strength how much to take   omeprazole 20 MG capsule Commonly known as: PRILOSEC Take 20 mg by mouth daily.   rosuvastatin 5 MG tablet Commonly known as: CRESTOR Take 1 tablet (5 mg total) by mouth daily.   spironolactone 25 MG tablet Commonly known as: ALDACTONE Take 0.5 tablets (12.5 mg total) by mouth daily. What changed: how much to take        Follow-up Information     Deland Pretty, MD. Go on 08/16/2021.   Specialty: Internal Medicine Why: @10 :Max Sane information: [address] [place] [address] [phone]  Allergies  Allergen Reactions   Bystolic [Nebivolol Hcl]     Bradycardia    Coreg [Carvedilol]     bradycardia   Minoxidil     Abdominal pain    Consultations: Cardiology   Procedures/Studies: DG Chest 2 View  Result Date: 07/28/2021 CLINICAL DATA:  Shortness of breath EXAM: CHEST - 2 VIEW COMPARISON:  Radiographs dated June 21, 2021 the FINDINGS: The heart is markedly enlarged. There are bilateral pleural effusions, right greater than the left. Underlying airspace disease cannot be excluded. IMPRESSION: 1.  Marked cardiomegaly. 2. Bilateral pleural effusions, right greater than the left. Underlying airspace disease cannot be excluded. Electronically Signed   By: Larose Hires D.O.   On: 07/28/2021 14:25   CARDIAC CATHETERIZATION  Addendum Date: 07/29/2021   Right dominant circulation Tortuous vessels with no significant coronary  artery disease Normal LVEDP Elder Negus, MD Pager: [phone] Office: [phone]   Result Date: 07/29/2021 Images from the original result were not included. Right dominant circulation Tortuous vessels with no significant coronary artery disease Normal LVEDP Elder Negus, MD Pager: [phone] Office: [phone]    Discharge Exam: Vitals:   08/02/21 0739 08/02/21 0740  BP:  113/65  Pulse:  71  Resp:    Temp: 98.3 F (36.8 C)   SpO2:  93%   Vitals:   08/02/21 0000 08/02/21 0400 08/02/21 0739 08/02/21 0740  BP: 110/63 (!) 99/54  113/65  Pulse: 80 64  71  Resp: 16 16    Temp: 98.4 F (36.9 C) 98.4 F (36.9 C) 98.3 F (36.8 C)   TempSrc: Oral Oral Oral   SpO2: 91% 96%  93%  Weight:      Height:        General: Pt is alert, awake, not in acute distress Cardiovascular: RRR, S1/S2 +, no rubs, no gallops Respiratory: CTA bilaterally, no wheezing, no rhonchi Abdominal: Soft, NT, ND, bowel sounds + Extremities: no edema, no cyanosis    The results of significant diagnostics from this hospitalization (including imaging, microbiology, ancillary and laboratory) are listed below for reference.     Microbiology: Recent Results (from the past 240 hour(s))  Resp Panel by RT-PCR (Flu A&B, Covid) Nasopharyngeal Swab     Status: None   Collection Time: 07/28/21  8:19 PM   Specimen: Nasopharyngeal Swab; Nasopharyngeal(NP) swabs in vial transport medium  Result Value Ref Range Status   SARS Coronavirus 2 by RT PCR NEGATIVE NEGATIVE Final    Comment: (NOTE) SARS-CoV-2 target nucleic acids are NOT DETECTED.  The SARS-CoV-2 RNA is generally detectable in upper respiratory specimens during the acute phase of infection. The lowest concentration of SARS-CoV-2 viral copies this assay can detect is 138 copies/mL. A negative result does not preclude SARS-Cov-2 infection and should not be used as the sole basis for treatment or other patient management decisions. A  negative result may occur with  improper specimen collection/handling, submission of specimen other than nasopharyngeal swab, presence of viral mutation(s) within the areas targeted by this assay, and inadequate number of viral copies(<138 copies/mL). A negative result must be combined with clinical observations, patient history, and epidemiological information. The expected result is Negative.  Fact Sheet for Patients:  BloggerCourse.com  Fact Sheet for Healthcare Providers:  SeriousBroker.it  This test is no t yet approved or cleared by the [place] FDA and  has been authorized for detection and/or diagnosis of SARS-CoV-2 by FDA under an Emergency Use Authorization (EUA). This EUA will remain  in effect (meaning  this test can be used) for the duration of the COVID-19 declaration under Section 564(b)(1) of the Act, 21 U.S.C.section 360bbb-3(b)(1), unless the authorization is terminated  or revoked sooner.       Influenza A by PCR NEGATIVE NEGATIVE Final   Influenza B by PCR NEGATIVE NEGATIVE Final    Comment: (NOTE) The Xpert Xpress SARS-CoV-2/FLU/RSV plus assay is intended as an aid in the diagnosis of influenza from Nasopharyngeal swab specimens and should not be used as a sole basis for treatment. Nasal washings and aspirates are unacceptable for Xpert Xpress SARS-CoV-2/FLU/RSV testing.  Fact Sheet for Patients: EntrepreneurPulse.com.au  Fact Sheet for Healthcare Providers: IncredibleEmployment.be  This test is not yet approved or cleared by the [place] FDA and has been authorized for detection and/or diagnosis of SARS-CoV-2 by FDA under an Emergency Use Authorization (EUA). This EUA will remain in effect (meaning this test can be used) for the duration of the COVID-19 declaration under Section 564(b)(1) of the Act, 21 U.S.C. section 360bbb-3(b)(1), unless the authorization  is terminated or revoked.  Performed at [facility], [place] [address] [place], [place] [address]      Labs: BNP (last 3 results) Recent Labs    06/26/21 0500 06/27/21 0441 07/28/21 1231  BNP 1,617.9* 1,914.8* [id number]*   Basic Metabolic Panel: Recent Labs  Lab 07/28/21 2140 07/29/21 0337 07/29/21 0813 07/30/21 0445 07/30/21 1750 07/31/21 0522 08/01/21 0405 08/02/21 0315  NA 139 137   < > 138 137 137 134* 137  K 3.4* 3.6   < > 3.4* 4.0 3.8 4.3 3.7  CL 93* 95*   < > 98 97* 99 96* 99  CO2 34* 34*   < > 34* 33* 32 32 32  GLUCOSE 100* 110*   < > 90 117* 99 96 106*  BUN 43* 43*   < > 33* 28* 24* 18 13  CREATININE 1.36* 1.30*   < > 1.03* 0.97 0.85 0.85 0.80  CALCIUM 8.8* 8.7*   < > 8.5* 8.8* 8.6* 8.4* 8.5*  MG 1.9 2.0  --   --  2.0 2.0  --  1.9  PHOS 3.6 3.3  --   --   --  1.6*  --   --    < > = values in this interval not displayed.   Liver Function Tests: Recent Labs  Lab 07/28/21 2140 07/29/21 0337  AST 61* 60*  ALT 23 22  ALKPHOS 68 67  BILITOT 1.3* 0.9  PROT 6.4* 6.2*  ALBUMIN 2.7* 2.5*   No results for input(s): LIPASE, AMYLASE in the last 168 hours. No results for input(s): AMMONIA in the last 168 hours. CBC: Recent Labs  Lab 07/28/21 1230 07/29/21 0337 07/30/21 0445 07/31/21 0522  WBC 9.4 9.0 7.1 6.6  NEUTROABS  --  6.0 3.8  --   HGB 14.6 13.1 12.1 11.3*  HCT 45.1 40.0 37.0 35.3*  MCV 71.2* 72.2* 72.0* 72.9*  PLT 159 128* PLATELET CLUMPS NOTED ON SMEAR, COUNT APPEARS DECREASED 103*   Cardiac Enzymes: Recent Labs  Lab 07/28/21 2140  CKTOTAL 160   BNP: Invalid input(s): POCBNP CBG: Recent Labs  Lab 08/01/21 2008 08/02/21 0010 08/02/21 0427 08/02/21 0738 08/02/21 1054  GLUCAP 115* 119* 124* 110* 100*   D-Dimer No results for input(s): DDIMER in the last 72 hours. Hgb A1c No results for input(s): HGBA1C in the last 72 hours. Lipid Profile No results for input(s): CHOL, HDL, LDLCALC, TRIG, CHOLHDL, LDLDIRECT in the last 72  hours. Thyroid function studies No results for input(s): TSH, T4TOTAL, T3FREE, THYROIDAB in the last 72 hours.  Invalid input(s): FREET3 Anemia work up No results for input(s): VITAMINB12, FOLATE, FERRITIN, TIBC, IRON, RETICCTPCT in the last 72 hours. Urinalysis    Component Value Date/Time   COLORURINE AMBER (A) 07/29/2021 0115   APPEARANCEUR HAZY (A) 07/29/2021 0115   LABSPEC 1.018 07/29/2021 0115   PHURINE 5.0 07/29/2021 0115   GLUCOSEU NEGATIVE 07/29/2021 0115   HGBUR MODERATE (A) 07/29/2021 0115   BILIRUBINUR NEGATIVE 07/29/2021 0115   KETONESUR 5 (A) 07/29/2021 0115   PROTEINUR NEGATIVE 07/29/2021 0115   NITRITE NEGATIVE 07/29/2021 0115   LEUKOCYTESUR NEGATIVE 07/29/2021 0115   Sepsis Labs Invalid input(s): PROCALCITONIN,  WBC,  LACTICIDVEN Microbiology Recent Results (from the past 240 hour(s))  Resp Panel by RT-PCR (Flu A&B, Covid) Nasopharyngeal Swab     Status: None   Collection Time: 07/28/21  8:19 PM   Specimen: Nasopharyngeal Swab; Nasopharyngeal(NP) swabs in vial transport medium  Result Value Ref Range Status   SARS Coronavirus 2 by RT PCR NEGATIVE NEGATIVE Final    Comment: (NOTE) SARS-CoV-2 target nucleic acids are NOT DETECTED.  The SARS-CoV-2 RNA is generally detectable in upper respiratory specimens during the acute phase of infection. The lowest concentration of SARS-CoV-2 viral copies this assay can detect is 138 copies/mL. A negative result does not preclude SARS-Cov-2 infection and should not be used as the sole basis for treatment or other patient management decisions. A negative result may occur with  improper specimen collection/handling, submission of specimen other than nasopharyngeal swab, presence of viral mutation(s) within the areas targeted by this assay, and inadequate number of viral copies(<138 copies/mL). A negative result must be combined with clinical observations, patient history, and epidemiological information. The expected  result is Negative.  Fact Sheet for Patients:  EntrepreneurPulse.com.au  Fact Sheet for Healthcare Providers:  IncredibleEmployment.be  This test is no t yet approved or cleared by the [place] FDA and  has been authorized for detection and/or diagnosis of SARS-CoV-2 by FDA under an Emergency Use Authorization (EUA). This EUA will remain  in effect (meaning this test can be used) for the duration of the COVID-19 declaration under Section 564(b)(1) of the Act, 21 U.S.C.section 360bbb-3(b)(1), unless the authorization is terminated  or revoked sooner.       Influenza A by PCR NEGATIVE NEGATIVE Final   Influenza B by PCR NEGATIVE NEGATIVE Final    Comment: (NOTE) The Xpert Xpress SARS-CoV-2/FLU/RSV plus assay is intended as an aid in the diagnosis of influenza from Nasopharyngeal swab specimens and should not be used as a sole basis for treatment. Nasal washings and aspirates are unacceptable for Xpert Xpress SARS-CoV-2/FLU/RSV testing.  Fact Sheet for Patients: EntrepreneurPulse.com.au  Fact Sheet for Healthcare Providers: IncredibleEmployment.be  This test is not yet approved or cleared by the [place] FDA and has been authorized for detection and/or diagnosis of SARS-CoV-2 by FDA under an Emergency Use Authorization (EUA). This EUA will remain in effect (meaning this test can be used) for the duration of the COVID-19 declaration under Section 564(b)(1) of the Act, 21 U.S.C. section 360bbb-3(b)(1), unless the authorization is terminated or revoked.  Performed at [facility], [place] [address] [place], [place] [address]      Time coordinating discharge: Over 30 minutes  SIGNED:   Darliss Cheney, MD  [facility] 08/02/2021, 11:51 AM  If 7PM-7AM, please contact night-coverage [email or web address]

## 2021-08-03 DIAGNOSIS — I5022 Chronic systolic (congestive) heart failure: Secondary | ICD-10-CM | POA: Diagnosis not present

## 2021-08-03 DIAGNOSIS — T460X1D Poisoning by cardiac-stimulant glycosides and drugs of similar action, accidental (unintentional), subsequent encounter: Secondary | ICD-10-CM | POA: Diagnosis not present

## 2021-08-03 DIAGNOSIS — I214 Non-ST elevation (NSTEMI) myocardial infarction: Secondary | ICD-10-CM | POA: Diagnosis not present

## 2021-08-03 DIAGNOSIS — R5381 Other malaise: Secondary | ICD-10-CM | POA: Diagnosis not present

## 2021-08-17 NOTE — Progress Notes (Deleted)
Primary Physician/Referring:  Deland Pretty, MD  Patient ID: Ashley [name], female    DOB: 1946/03/23, 75 y.o.   MRN: [id number]  No chief complaint on file.  HPI:    Ashley [name]  is a 75 y.o. female  with  morbid obesity, hypertension, chronic dyspnea on exertion, abnormal EKG with a negative nuclear stress test in 2016. 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  Patient presents for hospital follow. She presented to [facility] emergency department 04/04/2020 with complaint of confusion and found to be bradycardic however she was seen by Dr. Virgina Jock who felt presentation could not be explained by bradycardia. Further evaluation revealed subacute right temporal lobe ischemic infarction.   Patient admitted 06/19/2021 - 06/27/2021 with A. 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  She now presents for with her granddaughter present at bedside.  She is presently wheelchair-bound.  Since hospitalization she is feeling better.  Denies chest pain, palpitations, dizziness, syncope, near syncope.  She does have mild dyspnea on exertion, particularly with home physical therapy.  Blood pressure is well controlled on home readings with physical therapist.  Patient was discharged on Lopressor 100 mg p.o. twice daily, Torsemide 20 mg p.o. daily, and Eliquis.  She is tolerating anticoagulation without bleeding diathesis.  Notably during recent hospitalization attempted TEE cardioversion, however procedure was aborted due to hypotension and hypoxia.  She previously received CPR for possible PEA.  Cardioversion was not performed given severe RV dysfunction and high suspicion for PE.  Patient required intubation, which she was weaned off.  Past Medical History:  Diagnosis Date  . Essential hypertension 11/23/2018  . Obesity 11/23/2018  . Stroke [facility])    Past Surgical History:  Procedure  Laterality Date  . CARDIOVERSION N/A 06/21/2021   Procedure: CARDIOVERSION;  Surgeon: Nigel Mormon, MD;  Location: [facility] ENDOSCOPY;  Service: Cardiovascular;  Laterality: N/A;  . LEFT HEART CATH AND CORONARY ANGIOGRAPHY N/A 07/29/2021   Procedure: LEFT HEART CATH AND CORONARY ANGIOGRAPHY;  Surgeon: Nigel Mormon, MD;  Location: [place] [facility];  Service: Cardiovascular;  Laterality: N/A;  . TEE WITHOUT CARDIOVERSION N/A 06/21/2021   Procedure: TRANSESOPHAGEAL ECHOCARDIOGRAM (TEE);  Surgeon: Nigel Mormon, MD;  Location: [facility] ENDOSCOPY;  Service: Cardiovascular;  Laterality: N/A;   Family History  Problem Relation Age of Onset  . Stroke Mother   . Hypertension Mother     Social History   Tobacco Use  . Smoking status: Never    Passive exposure: Never  . Smokeless tobacco: Never  Substance Use Topics  . Alcohol use: No   Marital Status: Married   ROS  Review of Systems  Constitutional: Negative for malaise/fatigue and weight gain.  Cardiovascular:  Positive for dyspnea on exertion. Negative for chest pain, claudication, leg swelling, near-syncope, orthopnea, palpitations, paroxysmal nocturnal dyspnea and syncope.  Hematologic/Lymphatic: Does not bruise/bleed easily.  Gastrointestinal:  Negative for melena.  Neurological:  Negative for dizziness and weakness.   Objective  There were no vitals taken for this visit.  Vitals with BMI 08/02/2021 08/02/2021 08/02/2021  Height - - -  Weight - - -  BMI - - -  Systolic 540 086 99  Diastolic 49 65 54  Pulse 48 71 64      Physical Exam Vitals reviewed.  Constitutional:      Appearance: She is obese.     Comments: Wheelchair-bound  HENT:  Head: Normocephalic and atraumatic.  Cardiovascular:     Rate and Rhythm: Normal rate.     Pulses: Intact distal pulses.          Carotid pulses are 2+ on the right side and 2+ on the left side.      Radial pulses are 2+ on the right side and 2+ on the left side.        Dorsalis pedis pulses are 2+ on the right side and 2+ on the left side.       Posterior tibial pulses are 2+ on the right side and 2+ on the left side.     Heart sounds: S1 normal and S2 normal. No murmur heard.   No gallop.     Comments: Femoral and popliteal pulses difficult to evaluate due to patient's body habitus.  Pulmonary:     Effort: Pulmonary effort is normal. No respiratory distress.     Breath sounds: No wheezing, rhonchi or rales.  Musculoskeletal:     Right lower leg: Edema (minimal) present.     Left lower leg: Edema (minimal) present.  Neurological:     Mental Status: She is alert.    Laboratory examination:   Recent Labs    07/31/21 0522 08/01/21 0405 08/02/21 0315  NA 137 134* 137  K 3.8 4.3 3.7  CL 99 96* 99  CO2 32 32 32  GLUCOSE 99 96 106*  BUN 24* 18 13  CREATININE 0.85 0.85 0.80  CALCIUM 8.6* 8.4* 8.5*  GFRNONAA >60 >60 >60    CrCl cannot be calculated (Unknown ideal weight.).  CMP Latest Ref Rng & Units 08/02/2021 08/01/2021 07/31/2021  Glucose 70 - 99 mg/dL 106(H) 96 99  BUN 8 - 23 mg/dL 13 18 24(H)  Creatinine 0.44 - 1.00 mg/dL 0.80 0.85 0.85  Sodium 135 - 145 mmol/L 137 134(L) 137  Potassium 3.5 - 5.1 mmol/L 3.7 4.3 3.8  Chloride 98 - 111 mmol/L 99 96(L) 99  CO2 22 - 32 mmol/L 32 32 32  Calcium 8.9 - 10.3 mg/dL 8.5(L) 8.4(L) 8.6(L)  Total Protein 6.5 - 8.1 g/dL - - -  Total Bilirubin 0.3 - 1.2 mg/dL - - -  Alkaline Phos 38 - 126 U/L - - -  AST 15 - 41 U/L - - -  ALT 0 - 44 U/L - - -   CBC Latest Ref Rng & Units 07/31/2021 07/30/2021 07/29/2021  WBC 4.0 - 10.5 K/uL 6.6 7.1 9.0  Hemoglobin 12.0 - 15.0 g/dL 11.3(L) 12.1 13.1  Hematocrit 36.0 - 46.0 % 35.3(L) 37.0 40.0  Platelets 150 - 400 K/uL 103(L) PLATELET CLUMPS NOTED ON SMEAR, COUNT APPEARS DECREASED 128(L)    Lipid Panel Recent Labs    06/22/21 0302  TRIG 95     HEMOGLOBIN A1C Lab Results  Component Value Date   HGBA1C 6.2 (H) 07/29/2021   MPG 131.24 07/29/2021    TSH Recent Labs    06/19/21 1229 07/29/21 0337  TSH 1.412 1.511     External labs:   10/15/2019:  Hb 13.1/HCT 40.0, platelets 182. Serum glucose 104 mg, BUN 10, creatinine 0.91, EGFR >60 mL.  Potassium 4.6.  CMP normal. Total cholesterol 162, triglycerides 81, HDL 70, LDL 77.  Vitamin D 49.5. A1c 6.3%.  Allergies   Allergies  Allergen Reactions  . Bystolic [Nebivolol Hcl]     Bradycardia   . Coreg [Carvedilol]     bradycardia  . Minoxidil     Abdominal pain  Medications Prior to Visit:   Outpatient Medications Prior to Visit  Medication Sig Dispense Refill  . acetaminophen (TYLENOL) 500 MG tablet Take 500 mg by mouth daily as needed for mild pain or headache.    Marland Kitchen amiodarone (PACERONE) 100 MG tablet Take 1 tablet (100 mg total) by mouth daily. 30 tablet 0  . apixaban (ELIQUIS) 5 MG TABS tablet Take 1 tablet (5 mg total) by mouth 2 (two) times daily. 60 tablet 0  . famotidine (PEPCID) 20 MG tablet Take 1 tablet (20 mg total) by mouth daily. 30 tablet 0  . losartan (COZAAR) 25 MG tablet Take 0.5 tablets (12.5 mg total) by mouth daily. 30 tablet 0  . metoprolol tartrate (LOPRESSOR) 25 MG tablet Take 1 tablet (25 mg total) by mouth 2 (two) times daily. 60 tablet 0  . omeprazole (PRILOSEC) 20 MG capsule Take 20 mg by mouth daily.    . rosuvastatin (CRESTOR) 5 MG tablet Take 1 tablet (5 mg total) by mouth daily. 90 tablet 3  . spironolactone (ALDACTONE) 25 MG tablet Take 0.5 tablets (12.5 mg total) by mouth daily. 30 tablet 0   No facility-administered medications prior to visit.   Final Medications at End of Visit    No outpatient medications have been marked as taking for the 08/18/21 encounter (Appointment) with Rayetta Pigg, Nathon Stefanski C, PA-C.   Radiology:   No results found. CTA chest 07/18/2021: 1. Substantial pulmonary embolus in the right lung with lobar involvement of the upper lobe, lower lobe, and middle lobe pulmonary arteries. Positive for acute PE with  CT evidence of right heart strain (RV/LV Ratio = 1.2) consistent with at least submassive (intermediate risk) PE. The presence of right heart strain has been associated with an increased risk of morbidity and mortality. Please refer to the "PE Focused" order set in EPIC. 2. Moderate cardiomegaly. 3. Moderate right pleural effusion. 4. Patchy airspace opacity in the right middle lobe and right lower lobe could be from hemorrhage or pneumonia. 5. Moderate atelectasis in the left lower lobe.  CT ANGIO NECK W OR WO CONTRAST 04/05/2020 1. 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  2. Otherwise negative CTA of the head and neck. No other large vessel occlusion, hemodynamically significant stenosis, or other acute vascular abnormality.  3. 3 mm subpleural left upper lobe nodule, indeterminate. No follow-up needed if patient is low-risk. Non-contrast chest CT can be considered in 12 months if patient is high-risk. This recommendation follows the consensus statement: Guidelines for Management of Incidental Pulmonary Nodules Detected on CT Images: From the Fleischner Society 2017; Radiology 2017; 284:228-243.   CT HEAD WO CONTRAST 04/04/2020 1. Loss of gray-white differentiation geographic region of ill-defined hypoattenuation in the right temporal lobe concerning for age-indeterminate infarct, possibly late acute to subacute.  2. No other acute intracranial abnormality.  3. Chronic microvascular angiopathy and parenchymal volume loss.  4. Extensive periapical lucencies of the maxillary dentition including a left maxillary molar possibly suggesting an odontogenic origin of left maxillary sinus disease. Correlate with dental exam.    DG Chest Port 1 View 04/04/2020 No active disease.  Cardiac Studies:  Bilateral DVT study lower extremity 06/23/2021: RIGHT:  - Findings consistent with acute deep vein thrombosis involving the  right  common femoral vein.  - No cystic structure found in the popliteal fossa.     LEFT:  - Findings consistent with age indeterminate deep vein thrombosis  involving the left posterior tibial veins,  and left peroneal veins.  - No cystic structure found in the popliteal fossa.   Echocardiogram 06/22/2021:  1. Left ventricular ejection fraction, by estimation, is 40 to 45%. The  left ventricle has mildly decreased function. LVEF is likely  underestimated due to interentricular septal flatterning in systole and  diastole s/o RV pressure and volume overload.  There is moderate left ventricular hypertrophy. Left ventricular diastolic  function could not be evaluated. There is the interventricular septum is  flattened in systole and diastole, consistent with right ventricular  pressure and volume overload.   2. Right ventricular systolic function is moderately reduced. The right  ventricular size is mildly enlarged. There is mildly elevated pulmonary  artery systolic pressure.   3. Left atrial size was severely dilated.   4. Right atrial size was severely dilated.   5. A small pericardial effusion is present. There is no evidence of  cardiac tamponade.   6. The mitral valve is grossly normal. Moderate to severe mitral valve  regurgitation.   7. Tricuspid valve regurgitation is severe.   8. The aortic valve is tricuspid. Aortic valve regurgitation is not  visualized.   9. Above abnormalities are new compared to previous TTE in 2021.   Lexiscan Myoview nuclear stress test 03/02/2015: 1. The resting electrocardiogram demonstrated normal sinus rhythm, IVCD, LVH, no resting arrhythmias and nonspecific ST-T changes. Stress EKG is non-diagnostic for ischemia as it a pharmacologic stress using Lexiscan. Occasional PVC and atrial couplets and PAC. 2. Myocardial perfusion imaging is normal. Overall left ventricular systolic function was normal without regional wall motion abnormalities. The left  ventricular ejection fraction was 54%.  EKG:  07/02/2021: Atrial fibrillation with controlled ventricular response at a rate of 78 bpm. Anteroseptal infarct old, diffuse nonspecific ST-T abnormality. Unchanged compared to 06/19/2021, but now well rate controlled.   08/24/2020: Underlying sinus rhythm with premature atrial complexes at rate of 60 bpm.  Normal axis.  LVH with repolarization abnormality. Lateral T wave inversions, cannot exclude ischemia.   04/05/2020: sinus rhythm sinus bradycardia, rate 30-60 bpm, with frequent PACs. Inferolateral T wave inversions, not new.  04/08/19/20: Normal sinus rhythm at the rate of 60 bpm, frequent PACs, 6 beat run of atrial tachycardia, LVH with repolarization abnormality.  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 No significant change from  EKG 11/23/2018. No significant change compared to 08/08/2018.  PACs more frequent.  Assessment   No diagnosis found.    There are no discontinued medications.   No orders of the defined types were placed in this encounter.   Recommendations:   Ashley [name] is a 75 y.o. female  with  morbid obesity, hypertension, chronic dyspnea on exertion, abnormal EKG with a negative nuclear stress test in 2016. 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  Patient presents for follow-up of CVA diagnosed during hospitalization in 03/2020.  Patient was subsequent 10th 1/22 - 06/27/2021 with atrial fibrillation with RVR, as well as massive PE.  Patient's rate control medications were uptitrated to Lopressor 100 mg p.o. twice daily she was started on anticoagulation with Eliquis.  Patient is well rate controlled at today's office visit and tolerating anticoagulation without bleeding diathesis.  During recent admission attempted TEE cardioversion, however procedure was aborted given significant hypotension and hypoxia.  We will plan to bring her back and consider cardioversion in 3 to 4 weeks  if patient remains in atrial fibrillation after she has been on anticoagulation.  On physical exam patient is relatively euvolemic, however  she is complaining of dyspnea on exertion.  We will therefore obtain BNP as well as BMP and magnesium.  Patient is also requesting refill of statin medication, I have done this. Suspect patient's dyspnea on exertion is multifactorial including deconditioning.  For now we will continue torsemide 20 mg p.o. daily.  Patient is blood pressure is markedly elevated in the office, however it has been monitored recently at other provider offices and by [facility] and is well controlled.  Consider enrolling patient in remote patient monitoring at next office visit.  Follow-up in 3 weeks, sooner if needed, for A. fib, hypertension.  She is following with pulmonology for management of PE.   Alethia Berthold, PA-C 08/17/2021, 2:22 PM Office: [phone]

## 2021-08-18 ENCOUNTER — Ambulatory Visit: Payer: Medicare Other | Admitting: Student

## 2021-08-20 DIAGNOSIS — I480 Paroxysmal atrial fibrillation: Secondary | ICD-10-CM | POA: Diagnosis not present

## 2021-08-20 DIAGNOSIS — I69828 Other speech and language deficits following other cerebrovascular disease: Secondary | ICD-10-CM | POA: Diagnosis not present

## 2021-08-20 DIAGNOSIS — G9341 Metabolic encephalopathy: Secondary | ICD-10-CM | POA: Diagnosis not present

## 2021-08-20 DIAGNOSIS — T460X1D Poisoning by cardiac-stimulant glycosides and drugs of similar action, accidental (unintentional), subsequent encounter: Secondary | ICD-10-CM | POA: Diagnosis not present

## 2021-08-20 DIAGNOSIS — R2689 Other abnormalities of gait and mobility: Secondary | ICD-10-CM | POA: Diagnosis not present

## 2021-08-20 DIAGNOSIS — R5381 Other malaise: Secondary | ICD-10-CM | POA: Diagnosis not present

## 2021-08-20 DIAGNOSIS — R2681 Unsteadiness on feet: Secondary | ICD-10-CM | POA: Diagnosis not present

## 2021-08-20 DIAGNOSIS — R1312 Dysphagia, oropharyngeal phase: Secondary | ICD-10-CM | POA: Diagnosis not present

## 2021-08-20 DIAGNOSIS — M6281 Muscle weakness (generalized): Secondary | ICD-10-CM | POA: Diagnosis not present

## 2021-08-20 DIAGNOSIS — I69891 Dysphagia following other cerebrovascular disease: Secondary | ICD-10-CM | POA: Diagnosis not present

## 2021-08-20 DIAGNOSIS — I5022 Chronic systolic (congestive) heart failure: Secondary | ICD-10-CM | POA: Diagnosis not present

## 2021-08-20 DIAGNOSIS — I10 Essential (primary) hypertension: Secondary | ICD-10-CM | POA: Diagnosis not present

## 2021-08-21 DIAGNOSIS — R2681 Unsteadiness on feet: Secondary | ICD-10-CM | POA: Diagnosis not present

## 2021-08-21 DIAGNOSIS — R1312 Dysphagia, oropharyngeal phase: Secondary | ICD-10-CM | POA: Diagnosis not present

## 2021-08-21 DIAGNOSIS — R2689 Other abnormalities of gait and mobility: Secondary | ICD-10-CM | POA: Diagnosis not present

## 2021-08-21 DIAGNOSIS — I69828 Other speech and language deficits following other cerebrovascular disease: Secondary | ICD-10-CM | POA: Diagnosis not present

## 2021-08-21 DIAGNOSIS — M6281 Muscle weakness (generalized): Secondary | ICD-10-CM | POA: Diagnosis not present

## 2021-08-21 DIAGNOSIS — I69891 Dysphagia following other cerebrovascular disease: Secondary | ICD-10-CM | POA: Diagnosis not present

## 2021-08-21 DIAGNOSIS — G9341 Metabolic encephalopathy: Secondary | ICD-10-CM | POA: Diagnosis not present

## 2021-08-22 DIAGNOSIS — R1312 Dysphagia, oropharyngeal phase: Secondary | ICD-10-CM | POA: Diagnosis not present

## 2021-08-22 DIAGNOSIS — I69891 Dysphagia following other cerebrovascular disease: Secondary | ICD-10-CM | POA: Diagnosis not present

## 2021-08-22 DIAGNOSIS — R2681 Unsteadiness on feet: Secondary | ICD-10-CM | POA: Diagnosis not present

## 2021-08-22 DIAGNOSIS — I69828 Other speech and language deficits following other cerebrovascular disease: Secondary | ICD-10-CM | POA: Diagnosis not present

## 2021-08-22 DIAGNOSIS — M6281 Muscle weakness (generalized): Secondary | ICD-10-CM | POA: Diagnosis not present

## 2021-08-22 DIAGNOSIS — G9341 Metabolic encephalopathy: Secondary | ICD-10-CM | POA: Diagnosis not present

## 2021-08-22 DIAGNOSIS — R2689 Other abnormalities of gait and mobility: Secondary | ICD-10-CM | POA: Diagnosis not present

## 2021-08-23 ENCOUNTER — Telehealth: Payer: Self-pay

## 2021-08-23 DIAGNOSIS — I69891 Dysphagia following other cerebrovascular disease: Secondary | ICD-10-CM | POA: Diagnosis not present

## 2021-08-23 DIAGNOSIS — R2689 Other abnormalities of gait and mobility: Secondary | ICD-10-CM | POA: Diagnosis not present

## 2021-08-23 DIAGNOSIS — R1312 Dysphagia, oropharyngeal phase: Secondary | ICD-10-CM | POA: Diagnosis not present

## 2021-08-23 DIAGNOSIS — M6281 Muscle weakness (generalized): Secondary | ICD-10-CM | POA: Diagnosis not present

## 2021-08-23 DIAGNOSIS — G9341 Metabolic encephalopathy: Secondary | ICD-10-CM | POA: Diagnosis not present

## 2021-08-23 DIAGNOSIS — I69828 Other speech and language deficits following other cerebrovascular disease: Secondary | ICD-10-CM | POA: Diagnosis not present

## 2021-08-23 DIAGNOSIS — R2681 Unsteadiness on feet: Secondary | ICD-10-CM | POA: Diagnosis not present

## 2021-08-23 NOTE — Telephone Encounter (Signed)
Spoke with patient and scheduled an in-person Palliative Consult for 09/07/21 @ 8:30 AM with Dr. Bufford Spikes. Documentation will be noted in [facility]'s EMR [facility].   COVID screening was negative. No pets in home. Patient's son lives with her.   Consent obtained; updated Outlook/Netsmart/Team List and Epic.   Patient is aware she may be receiving a call from provider the day before or day of to confirm appointment.

## 2021-08-24 DIAGNOSIS — I69891 Dysphagia following other cerebrovascular disease: Secondary | ICD-10-CM | POA: Diagnosis not present

## 2021-08-24 DIAGNOSIS — G9341 Metabolic encephalopathy: Secondary | ICD-10-CM | POA: Diagnosis not present

## 2021-08-24 DIAGNOSIS — R2689 Other abnormalities of gait and mobility: Secondary | ICD-10-CM | POA: Diagnosis not present

## 2021-08-24 DIAGNOSIS — R1312 Dysphagia, oropharyngeal phase: Secondary | ICD-10-CM | POA: Diagnosis not present

## 2021-08-24 DIAGNOSIS — I69828 Other speech and language deficits following other cerebrovascular disease: Secondary | ICD-10-CM | POA: Diagnosis not present

## 2021-08-24 DIAGNOSIS — M6281 Muscle weakness (generalized): Secondary | ICD-10-CM | POA: Diagnosis not present

## 2021-08-24 DIAGNOSIS — R2681 Unsteadiness on feet: Secondary | ICD-10-CM | POA: Diagnosis not present

## 2021-08-25 DIAGNOSIS — R1312 Dysphagia, oropharyngeal phase: Secondary | ICD-10-CM | POA: Diagnosis not present

## 2021-08-25 DIAGNOSIS — M6281 Muscle weakness (generalized): Secondary | ICD-10-CM | POA: Diagnosis not present

## 2021-08-25 DIAGNOSIS — G9341 Metabolic encephalopathy: Secondary | ICD-10-CM | POA: Diagnosis not present

## 2021-08-25 DIAGNOSIS — I69828 Other speech and language deficits following other cerebrovascular disease: Secondary | ICD-10-CM | POA: Diagnosis not present

## 2021-08-25 DIAGNOSIS — R2689 Other abnormalities of gait and mobility: Secondary | ICD-10-CM | POA: Diagnosis not present

## 2021-08-25 DIAGNOSIS — R2681 Unsteadiness on feet: Secondary | ICD-10-CM | POA: Diagnosis not present

## 2021-08-25 DIAGNOSIS — I69891 Dysphagia following other cerebrovascular disease: Secondary | ICD-10-CM | POA: Diagnosis not present

## 2021-08-26 DIAGNOSIS — R1312 Dysphagia, oropharyngeal phase: Secondary | ICD-10-CM | POA: Diagnosis not present

## 2021-08-26 DIAGNOSIS — I10 Essential (primary) hypertension: Secondary | ICD-10-CM | POA: Diagnosis not present

## 2021-08-26 DIAGNOSIS — G9341 Metabolic encephalopathy: Secondary | ICD-10-CM | POA: Diagnosis not present

## 2021-08-26 DIAGNOSIS — R2681 Unsteadiness on feet: Secondary | ICD-10-CM | POA: Diagnosis not present

## 2021-08-26 DIAGNOSIS — I69891 Dysphagia following other cerebrovascular disease: Secondary | ICD-10-CM | POA: Diagnosis not present

## 2021-08-26 DIAGNOSIS — M6281 Muscle weakness (generalized): Secondary | ICD-10-CM | POA: Diagnosis not present

## 2021-08-26 DIAGNOSIS — R2689 Other abnormalities of gait and mobility: Secondary | ICD-10-CM | POA: Diagnosis not present

## 2021-08-26 DIAGNOSIS — I69828 Other speech and language deficits following other cerebrovascular disease: Secondary | ICD-10-CM | POA: Diagnosis not present

## 2021-08-27 DIAGNOSIS — R2681 Unsteadiness on feet: Secondary | ICD-10-CM | POA: Diagnosis not present

## 2021-08-27 DIAGNOSIS — I69891 Dysphagia following other cerebrovascular disease: Secondary | ICD-10-CM | POA: Diagnosis not present

## 2021-08-27 DIAGNOSIS — M6281 Muscle weakness (generalized): Secondary | ICD-10-CM | POA: Diagnosis not present

## 2021-08-27 DIAGNOSIS — G9341 Metabolic encephalopathy: Secondary | ICD-10-CM | POA: Diagnosis not present

## 2021-08-27 DIAGNOSIS — I69828 Other speech and language deficits following other cerebrovascular disease: Secondary | ICD-10-CM | POA: Diagnosis not present

## 2021-08-27 DIAGNOSIS — R2689 Other abnormalities of gait and mobility: Secondary | ICD-10-CM | POA: Diagnosis not present

## 2021-08-27 DIAGNOSIS — R1312 Dysphagia, oropharyngeal phase: Secondary | ICD-10-CM | POA: Diagnosis not present

## 2021-08-28 DIAGNOSIS — G9341 Metabolic encephalopathy: Secondary | ICD-10-CM | POA: Diagnosis not present

## 2021-08-28 DIAGNOSIS — R2681 Unsteadiness on feet: Secondary | ICD-10-CM | POA: Diagnosis not present

## 2021-08-28 DIAGNOSIS — I69828 Other speech and language deficits following other cerebrovascular disease: Secondary | ICD-10-CM | POA: Diagnosis not present

## 2021-08-28 DIAGNOSIS — M6281 Muscle weakness (generalized): Secondary | ICD-10-CM | POA: Diagnosis not present

## 2021-08-28 DIAGNOSIS — I69891 Dysphagia following other cerebrovascular disease: Secondary | ICD-10-CM | POA: Diagnosis not present

## 2021-08-28 DIAGNOSIS — R2689 Other abnormalities of gait and mobility: Secondary | ICD-10-CM | POA: Diagnosis not present

## 2021-08-28 DIAGNOSIS — R1312 Dysphagia, oropharyngeal phase: Secondary | ICD-10-CM | POA: Diagnosis not present

## 2021-08-29 DIAGNOSIS — R2681 Unsteadiness on feet: Secondary | ICD-10-CM | POA: Diagnosis not present

## 2021-08-29 DIAGNOSIS — R2689 Other abnormalities of gait and mobility: Secondary | ICD-10-CM | POA: Diagnosis not present

## 2021-08-29 DIAGNOSIS — M6281 Muscle weakness (generalized): Secondary | ICD-10-CM | POA: Diagnosis not present

## 2021-08-29 DIAGNOSIS — G9341 Metabolic encephalopathy: Secondary | ICD-10-CM | POA: Diagnosis not present

## 2021-08-29 DIAGNOSIS — R1312 Dysphagia, oropharyngeal phase: Secondary | ICD-10-CM | POA: Diagnosis not present

## 2021-08-29 DIAGNOSIS — I69891 Dysphagia following other cerebrovascular disease: Secondary | ICD-10-CM | POA: Diagnosis not present

## 2021-08-29 DIAGNOSIS — I69828 Other speech and language deficits following other cerebrovascular disease: Secondary | ICD-10-CM | POA: Diagnosis not present

## 2021-08-30 DIAGNOSIS — M6281 Muscle weakness (generalized): Secondary | ICD-10-CM | POA: Diagnosis not present

## 2021-08-30 DIAGNOSIS — G9341 Metabolic encephalopathy: Secondary | ICD-10-CM | POA: Diagnosis not present

## 2021-08-30 DIAGNOSIS — R2681 Unsteadiness on feet: Secondary | ICD-10-CM | POA: Diagnosis not present

## 2021-08-30 DIAGNOSIS — R2689 Other abnormalities of gait and mobility: Secondary | ICD-10-CM | POA: Diagnosis not present

## 2021-08-30 DIAGNOSIS — I69891 Dysphagia following other cerebrovascular disease: Secondary | ICD-10-CM | POA: Diagnosis not present

## 2021-08-30 DIAGNOSIS — I69828 Other speech and language deficits following other cerebrovascular disease: Secondary | ICD-10-CM | POA: Diagnosis not present

## 2021-08-30 DIAGNOSIS — R1312 Dysphagia, oropharyngeal phase: Secondary | ICD-10-CM | POA: Diagnosis not present

## 2021-08-31 DIAGNOSIS — R1312 Dysphagia, oropharyngeal phase: Secondary | ICD-10-CM | POA: Diagnosis not present

## 2021-08-31 DIAGNOSIS — R2681 Unsteadiness on feet: Secondary | ICD-10-CM | POA: Diagnosis not present

## 2021-08-31 DIAGNOSIS — G9341 Metabolic encephalopathy: Secondary | ICD-10-CM | POA: Diagnosis not present

## 2021-08-31 DIAGNOSIS — R2689 Other abnormalities of gait and mobility: Secondary | ICD-10-CM | POA: Diagnosis not present

## 2021-08-31 DIAGNOSIS — M6281 Muscle weakness (generalized): Secondary | ICD-10-CM | POA: Diagnosis not present

## 2021-08-31 DIAGNOSIS — I69828 Other speech and language deficits following other cerebrovascular disease: Secondary | ICD-10-CM | POA: Diagnosis not present

## 2021-08-31 DIAGNOSIS — I69891 Dysphagia following other cerebrovascular disease: Secondary | ICD-10-CM | POA: Diagnosis not present

## 2021-09-01 DIAGNOSIS — I69891 Dysphagia following other cerebrovascular disease: Secondary | ICD-10-CM | POA: Diagnosis not present

## 2021-09-01 DIAGNOSIS — R1312 Dysphagia, oropharyngeal phase: Secondary | ICD-10-CM | POA: Diagnosis not present

## 2021-09-01 DIAGNOSIS — R2681 Unsteadiness on feet: Secondary | ICD-10-CM | POA: Diagnosis not present

## 2021-09-01 DIAGNOSIS — M6281 Muscle weakness (generalized): Secondary | ICD-10-CM | POA: Diagnosis not present

## 2021-09-01 DIAGNOSIS — R2689 Other abnormalities of gait and mobility: Secondary | ICD-10-CM | POA: Diagnosis not present

## 2021-09-01 DIAGNOSIS — I69828 Other speech and language deficits following other cerebrovascular disease: Secondary | ICD-10-CM | POA: Diagnosis not present

## 2021-09-01 DIAGNOSIS — G9341 Metabolic encephalopathy: Secondary | ICD-10-CM | POA: Diagnosis not present

## 2021-09-02 DIAGNOSIS — I69828 Other speech and language deficits following other cerebrovascular disease: Secondary | ICD-10-CM | POA: Diagnosis not present

## 2021-09-02 DIAGNOSIS — G9341 Metabolic encephalopathy: Secondary | ICD-10-CM | POA: Diagnosis not present

## 2021-09-02 DIAGNOSIS — R2681 Unsteadiness on feet: Secondary | ICD-10-CM | POA: Diagnosis not present

## 2021-09-02 DIAGNOSIS — R2689 Other abnormalities of gait and mobility: Secondary | ICD-10-CM | POA: Diagnosis not present

## 2021-09-02 DIAGNOSIS — I69891 Dysphagia following other cerebrovascular disease: Secondary | ICD-10-CM | POA: Diagnosis not present

## 2021-09-02 DIAGNOSIS — M6281 Muscle weakness (generalized): Secondary | ICD-10-CM | POA: Diagnosis not present

## 2021-09-02 DIAGNOSIS — R1312 Dysphagia, oropharyngeal phase: Secondary | ICD-10-CM | POA: Diagnosis not present

## 2021-09-03 DIAGNOSIS — R2689 Other abnormalities of gait and mobility: Secondary | ICD-10-CM | POA: Diagnosis not present

## 2021-09-03 DIAGNOSIS — I69891 Dysphagia following other cerebrovascular disease: Secondary | ICD-10-CM | POA: Diagnosis not present

## 2021-09-03 DIAGNOSIS — G9341 Metabolic encephalopathy: Secondary | ICD-10-CM | POA: Diagnosis not present

## 2021-09-03 DIAGNOSIS — R1312 Dysphagia, oropharyngeal phase: Secondary | ICD-10-CM | POA: Diagnosis not present

## 2021-09-03 DIAGNOSIS — M6281 Muscle weakness (generalized): Secondary | ICD-10-CM | POA: Diagnosis not present

## 2021-09-03 DIAGNOSIS — I69828 Other speech and language deficits following other cerebrovascular disease: Secondary | ICD-10-CM | POA: Diagnosis not present

## 2021-09-03 DIAGNOSIS — R2681 Unsteadiness on feet: Secondary | ICD-10-CM | POA: Diagnosis not present

## 2021-09-04 DIAGNOSIS — R2681 Unsteadiness on feet: Secondary | ICD-10-CM | POA: Diagnosis not present

## 2021-09-04 DIAGNOSIS — R2689 Other abnormalities of gait and mobility: Secondary | ICD-10-CM | POA: Diagnosis not present

## 2021-09-04 DIAGNOSIS — M6281 Muscle weakness (generalized): Secondary | ICD-10-CM | POA: Diagnosis not present

## 2021-09-04 DIAGNOSIS — I69828 Other speech and language deficits following other cerebrovascular disease: Secondary | ICD-10-CM | POA: Diagnosis not present

## 2021-09-04 DIAGNOSIS — I69891 Dysphagia following other cerebrovascular disease: Secondary | ICD-10-CM | POA: Diagnosis not present

## 2021-09-04 DIAGNOSIS — R1312 Dysphagia, oropharyngeal phase: Secondary | ICD-10-CM | POA: Diagnosis not present

## 2021-09-04 DIAGNOSIS — G9341 Metabolic encephalopathy: Secondary | ICD-10-CM | POA: Diagnosis not present

## 2021-09-05 DIAGNOSIS — R2681 Unsteadiness on feet: Secondary | ICD-10-CM | POA: Diagnosis not present

## 2021-09-05 DIAGNOSIS — I69891 Dysphagia following other cerebrovascular disease: Secondary | ICD-10-CM | POA: Diagnosis not present

## 2021-09-05 DIAGNOSIS — I69828 Other speech and language deficits following other cerebrovascular disease: Secondary | ICD-10-CM | POA: Diagnosis not present

## 2021-09-05 DIAGNOSIS — R2689 Other abnormalities of gait and mobility: Secondary | ICD-10-CM | POA: Diagnosis not present

## 2021-09-05 DIAGNOSIS — G9341 Metabolic encephalopathy: Secondary | ICD-10-CM | POA: Diagnosis not present

## 2021-09-05 DIAGNOSIS — R1312 Dysphagia, oropharyngeal phase: Secondary | ICD-10-CM | POA: Diagnosis not present

## 2021-09-05 DIAGNOSIS — M6281 Muscle weakness (generalized): Secondary | ICD-10-CM | POA: Diagnosis not present

## 2021-09-06 DIAGNOSIS — I69891 Dysphagia following other cerebrovascular disease: Secondary | ICD-10-CM | POA: Diagnosis not present

## 2021-09-06 DIAGNOSIS — M6281 Muscle weakness (generalized): Secondary | ICD-10-CM | POA: Diagnosis not present

## 2021-09-06 DIAGNOSIS — I69828 Other speech and language deficits following other cerebrovascular disease: Secondary | ICD-10-CM | POA: Diagnosis not present

## 2021-09-06 DIAGNOSIS — R1312 Dysphagia, oropharyngeal phase: Secondary | ICD-10-CM | POA: Diagnosis not present

## 2021-09-06 DIAGNOSIS — R2689 Other abnormalities of gait and mobility: Secondary | ICD-10-CM | POA: Diagnosis not present

## 2021-09-06 DIAGNOSIS — G9341 Metabolic encephalopathy: Secondary | ICD-10-CM | POA: Diagnosis not present

## 2021-09-06 DIAGNOSIS — R2681 Unsteadiness on feet: Secondary | ICD-10-CM | POA: Diagnosis not present

## 2021-09-07 DIAGNOSIS — I69891 Dysphagia following other cerebrovascular disease: Secondary | ICD-10-CM | POA: Diagnosis not present

## 2021-09-07 DIAGNOSIS — R2681 Unsteadiness on feet: Secondary | ICD-10-CM | POA: Diagnosis not present

## 2021-09-07 DIAGNOSIS — G9341 Metabolic encephalopathy: Secondary | ICD-10-CM | POA: Diagnosis not present

## 2021-09-07 DIAGNOSIS — I69828 Other speech and language deficits following other cerebrovascular disease: Secondary | ICD-10-CM | POA: Diagnosis not present

## 2021-09-07 DIAGNOSIS — M6281 Muscle weakness (generalized): Secondary | ICD-10-CM | POA: Diagnosis not present

## 2021-09-07 DIAGNOSIS — R2689 Other abnormalities of gait and mobility: Secondary | ICD-10-CM | POA: Diagnosis not present

## 2021-09-07 DIAGNOSIS — R1312 Dysphagia, oropharyngeal phase: Secondary | ICD-10-CM | POA: Diagnosis not present

## 2021-09-08 DIAGNOSIS — G9341 Metabolic encephalopathy: Secondary | ICD-10-CM | POA: Diagnosis not present

## 2021-09-08 DIAGNOSIS — I69891 Dysphagia following other cerebrovascular disease: Secondary | ICD-10-CM | POA: Diagnosis not present

## 2021-09-08 DIAGNOSIS — R2681 Unsteadiness on feet: Secondary | ICD-10-CM | POA: Diagnosis not present

## 2021-09-08 DIAGNOSIS — R2689 Other abnormalities of gait and mobility: Secondary | ICD-10-CM | POA: Diagnosis not present

## 2021-09-08 DIAGNOSIS — M6281 Muscle weakness (generalized): Secondary | ICD-10-CM | POA: Diagnosis not present

## 2021-09-08 DIAGNOSIS — R1312 Dysphagia, oropharyngeal phase: Secondary | ICD-10-CM | POA: Diagnosis not present

## 2021-09-08 DIAGNOSIS — I69828 Other speech and language deficits following other cerebrovascular disease: Secondary | ICD-10-CM | POA: Diagnosis not present

## 2021-09-09 DIAGNOSIS — R2689 Other abnormalities of gait and mobility: Secondary | ICD-10-CM | POA: Diagnosis not present

## 2021-09-09 DIAGNOSIS — G9341 Metabolic encephalopathy: Secondary | ICD-10-CM | POA: Diagnosis not present

## 2021-09-09 DIAGNOSIS — I69891 Dysphagia following other cerebrovascular disease: Secondary | ICD-10-CM | POA: Diagnosis not present

## 2021-09-09 DIAGNOSIS — R2681 Unsteadiness on feet: Secondary | ICD-10-CM | POA: Diagnosis not present

## 2021-09-09 DIAGNOSIS — M6281 Muscle weakness (generalized): Secondary | ICD-10-CM | POA: Diagnosis not present

## 2021-09-09 DIAGNOSIS — R1312 Dysphagia, oropharyngeal phase: Secondary | ICD-10-CM | POA: Diagnosis not present

## 2021-09-09 DIAGNOSIS — I69828 Other speech and language deficits following other cerebrovascular disease: Secondary | ICD-10-CM | POA: Diagnosis not present

## 2021-09-10 DIAGNOSIS — R2689 Other abnormalities of gait and mobility: Secondary | ICD-10-CM | POA: Diagnosis not present

## 2021-09-10 DIAGNOSIS — G9341 Metabolic encephalopathy: Secondary | ICD-10-CM | POA: Diagnosis not present

## 2021-09-10 DIAGNOSIS — R2681 Unsteadiness on feet: Secondary | ICD-10-CM | POA: Diagnosis not present

## 2021-09-10 DIAGNOSIS — I69891 Dysphagia following other cerebrovascular disease: Secondary | ICD-10-CM | POA: Diagnosis not present

## 2021-09-10 DIAGNOSIS — M6281 Muscle weakness (generalized): Secondary | ICD-10-CM | POA: Diagnosis not present

## 2021-09-10 DIAGNOSIS — I69828 Other speech and language deficits following other cerebrovascular disease: Secondary | ICD-10-CM | POA: Diagnosis not present

## 2021-09-10 DIAGNOSIS — R1312 Dysphagia, oropharyngeal phase: Secondary | ICD-10-CM | POA: Diagnosis not present

## 2021-09-12 DIAGNOSIS — M6281 Muscle weakness (generalized): Secondary | ICD-10-CM | POA: Diagnosis not present

## 2021-09-12 DIAGNOSIS — R1312 Dysphagia, oropharyngeal phase: Secondary | ICD-10-CM | POA: Diagnosis not present

## 2021-09-12 DIAGNOSIS — I69891 Dysphagia following other cerebrovascular disease: Secondary | ICD-10-CM | POA: Diagnosis not present

## 2021-09-12 DIAGNOSIS — I69828 Other speech and language deficits following other cerebrovascular disease: Secondary | ICD-10-CM | POA: Diagnosis not present

## 2021-09-12 DIAGNOSIS — G9341 Metabolic encephalopathy: Secondary | ICD-10-CM | POA: Diagnosis not present

## 2021-09-12 DIAGNOSIS — R2689 Other abnormalities of gait and mobility: Secondary | ICD-10-CM | POA: Diagnosis not present

## 2021-09-12 DIAGNOSIS — R2681 Unsteadiness on feet: Secondary | ICD-10-CM | POA: Diagnosis not present

## 2021-09-13 DIAGNOSIS — R2681 Unsteadiness on feet: Secondary | ICD-10-CM | POA: Diagnosis not present

## 2021-09-13 DIAGNOSIS — I69891 Dysphagia following other cerebrovascular disease: Secondary | ICD-10-CM | POA: Diagnosis not present

## 2021-09-13 DIAGNOSIS — I82403 Acute embolism and thrombosis of unspecified deep veins of lower extremity, bilateral: Secondary | ICD-10-CM | POA: Diagnosis not present

## 2021-09-13 DIAGNOSIS — I480 Paroxysmal atrial fibrillation: Secondary | ICD-10-CM | POA: Diagnosis not present

## 2021-09-13 DIAGNOSIS — Z515 Encounter for palliative care: Secondary | ICD-10-CM | POA: Diagnosis not present

## 2021-09-13 DIAGNOSIS — R1312 Dysphagia, oropharyngeal phase: Secondary | ICD-10-CM | POA: Diagnosis not present

## 2021-09-13 DIAGNOSIS — I69828 Other speech and language deficits following other cerebrovascular disease: Secondary | ICD-10-CM | POA: Diagnosis not present

## 2021-09-13 DIAGNOSIS — G9341 Metabolic encephalopathy: Secondary | ICD-10-CM | POA: Diagnosis not present

## 2021-09-13 DIAGNOSIS — M6281 Muscle weakness (generalized): Secondary | ICD-10-CM | POA: Diagnosis not present

## 2021-09-13 DIAGNOSIS — R2689 Other abnormalities of gait and mobility: Secondary | ICD-10-CM | POA: Diagnosis not present

## 2021-09-13 DIAGNOSIS — I2609 Other pulmonary embolism with acute cor pulmonale: Secondary | ICD-10-CM | POA: Diagnosis not present

## 2021-09-13 DIAGNOSIS — I5022 Chronic systolic (congestive) heart failure: Secondary | ICD-10-CM | POA: Diagnosis not present

## 2021-09-14 DIAGNOSIS — R2689 Other abnormalities of gait and mobility: Secondary | ICD-10-CM | POA: Diagnosis not present

## 2021-09-14 DIAGNOSIS — R2681 Unsteadiness on feet: Secondary | ICD-10-CM | POA: Diagnosis not present

## 2021-09-14 DIAGNOSIS — R1312 Dysphagia, oropharyngeal phase: Secondary | ICD-10-CM | POA: Diagnosis not present

## 2021-09-14 DIAGNOSIS — G9341 Metabolic encephalopathy: Secondary | ICD-10-CM | POA: Diagnosis not present

## 2021-09-14 DIAGNOSIS — M6281 Muscle weakness (generalized): Secondary | ICD-10-CM | POA: Diagnosis not present

## 2021-09-14 DIAGNOSIS — I69891 Dysphagia following other cerebrovascular disease: Secondary | ICD-10-CM | POA: Diagnosis not present

## 2021-09-14 DIAGNOSIS — I69828 Other speech and language deficits following other cerebrovascular disease: Secondary | ICD-10-CM | POA: Diagnosis not present

## 2021-09-15 DIAGNOSIS — I69828 Other speech and language deficits following other cerebrovascular disease: Secondary | ICD-10-CM | POA: Diagnosis not present

## 2021-09-15 DIAGNOSIS — G9341 Metabolic encephalopathy: Secondary | ICD-10-CM | POA: Diagnosis not present

## 2021-09-15 DIAGNOSIS — R2681 Unsteadiness on feet: Secondary | ICD-10-CM | POA: Diagnosis not present

## 2021-09-15 DIAGNOSIS — I69891 Dysphagia following other cerebrovascular disease: Secondary | ICD-10-CM | POA: Diagnosis not present

## 2021-09-15 DIAGNOSIS — R1312 Dysphagia, oropharyngeal phase: Secondary | ICD-10-CM | POA: Diagnosis not present

## 2021-09-15 DIAGNOSIS — R2689 Other abnormalities of gait and mobility: Secondary | ICD-10-CM | POA: Diagnosis not present

## 2021-09-15 DIAGNOSIS — M6281 Muscle weakness (generalized): Secondary | ICD-10-CM | POA: Diagnosis not present

## 2021-09-16 DIAGNOSIS — R1312 Dysphagia, oropharyngeal phase: Secondary | ICD-10-CM | POA: Diagnosis not present

## 2021-09-16 DIAGNOSIS — I69891 Dysphagia following other cerebrovascular disease: Secondary | ICD-10-CM | POA: Diagnosis not present

## 2021-09-16 DIAGNOSIS — R2681 Unsteadiness on feet: Secondary | ICD-10-CM | POA: Diagnosis not present

## 2021-09-16 DIAGNOSIS — M6281 Muscle weakness (generalized): Secondary | ICD-10-CM | POA: Diagnosis not present

## 2021-09-16 DIAGNOSIS — G9341 Metabolic encephalopathy: Secondary | ICD-10-CM | POA: Diagnosis not present

## 2021-09-16 DIAGNOSIS — I69828 Other speech and language deficits following other cerebrovascular disease: Secondary | ICD-10-CM | POA: Diagnosis not present

## 2021-09-16 DIAGNOSIS — R2689 Other abnormalities of gait and mobility: Secondary | ICD-10-CM | POA: Diagnosis not present

## 2021-09-17 DIAGNOSIS — G9341 Metabolic encephalopathy: Secondary | ICD-10-CM | POA: Diagnosis not present

## 2021-09-17 DIAGNOSIS — R1312 Dysphagia, oropharyngeal phase: Secondary | ICD-10-CM | POA: Diagnosis not present

## 2021-09-17 DIAGNOSIS — I69828 Other speech and language deficits following other cerebrovascular disease: Secondary | ICD-10-CM | POA: Diagnosis not present

## 2021-09-17 DIAGNOSIS — R2681 Unsteadiness on feet: Secondary | ICD-10-CM | POA: Diagnosis not present

## 2021-09-17 DIAGNOSIS — I69891 Dysphagia following other cerebrovascular disease: Secondary | ICD-10-CM | POA: Diagnosis not present

## 2021-09-17 DIAGNOSIS — R2689 Other abnormalities of gait and mobility: Secondary | ICD-10-CM | POA: Diagnosis not present

## 2021-09-17 DIAGNOSIS — M6281 Muscle weakness (generalized): Secondary | ICD-10-CM | POA: Diagnosis not present

## 2021-09-20 DIAGNOSIS — G9341 Metabolic encephalopathy: Secondary | ICD-10-CM | POA: Diagnosis not present

## 2021-09-20 DIAGNOSIS — M6281 Muscle weakness (generalized): Secondary | ICD-10-CM | POA: Diagnosis not present

## 2021-09-20 DIAGNOSIS — R2689 Other abnormalities of gait and mobility: Secondary | ICD-10-CM | POA: Diagnosis not present

## 2021-09-21 DIAGNOSIS — M6281 Muscle weakness (generalized): Secondary | ICD-10-CM | POA: Diagnosis not present

## 2021-09-21 DIAGNOSIS — N179 Acute kidney failure, unspecified: Secondary | ICD-10-CM | POA: Diagnosis not present

## 2021-09-21 DIAGNOSIS — G9341 Metabolic encephalopathy: Secondary | ICD-10-CM | POA: Diagnosis not present

## 2021-09-21 DIAGNOSIS — R2689 Other abnormalities of gait and mobility: Secondary | ICD-10-CM | POA: Diagnosis not present

## 2021-09-21 DIAGNOSIS — R2681 Unsteadiness on feet: Secondary | ICD-10-CM | POA: Diagnosis not present

## 2021-09-22 DIAGNOSIS — G9341 Metabolic encephalopathy: Secondary | ICD-10-CM | POA: Diagnosis not present

## 2021-09-22 DIAGNOSIS — R2689 Other abnormalities of gait and mobility: Secondary | ICD-10-CM | POA: Diagnosis not present

## 2021-09-22 DIAGNOSIS — M6281 Muscle weakness (generalized): Secondary | ICD-10-CM | POA: Diagnosis not present

## 2021-09-23 DIAGNOSIS — G9341 Metabolic encephalopathy: Secondary | ICD-10-CM | POA: Diagnosis not present

## 2021-09-23 DIAGNOSIS — M6281 Muscle weakness (generalized): Secondary | ICD-10-CM | POA: Diagnosis not present

## 2021-09-23 DIAGNOSIS — N179 Acute kidney failure, unspecified: Secondary | ICD-10-CM | POA: Diagnosis not present

## 2021-09-23 DIAGNOSIS — R2689 Other abnormalities of gait and mobility: Secondary | ICD-10-CM | POA: Diagnosis not present

## 2021-09-23 DIAGNOSIS — R2681 Unsteadiness on feet: Secondary | ICD-10-CM | POA: Diagnosis not present

## 2021-09-24 DIAGNOSIS — R2689 Other abnormalities of gait and mobility: Secondary | ICD-10-CM | POA: Diagnosis not present

## 2021-09-24 DIAGNOSIS — G9341 Metabolic encephalopathy: Secondary | ICD-10-CM | POA: Diagnosis not present

## 2021-09-24 DIAGNOSIS — M6281 Muscle weakness (generalized): Secondary | ICD-10-CM | POA: Diagnosis not present

## 2021-09-27 DIAGNOSIS — R2689 Other abnormalities of gait and mobility: Secondary | ICD-10-CM | POA: Diagnosis not present

## 2021-09-27 DIAGNOSIS — G9341 Metabolic encephalopathy: Secondary | ICD-10-CM | POA: Diagnosis not present

## 2021-09-27 DIAGNOSIS — M6281 Muscle weakness (generalized): Secondary | ICD-10-CM | POA: Diagnosis not present

## 2021-09-28 DIAGNOSIS — N179 Acute kidney failure, unspecified: Secondary | ICD-10-CM | POA: Diagnosis not present

## 2021-09-28 DIAGNOSIS — M6281 Muscle weakness (generalized): Secondary | ICD-10-CM | POA: Diagnosis not present

## 2021-09-28 DIAGNOSIS — R2681 Unsteadiness on feet: Secondary | ICD-10-CM | POA: Diagnosis not present

## 2021-09-28 DIAGNOSIS — R2689 Other abnormalities of gait and mobility: Secondary | ICD-10-CM | POA: Diagnosis not present

## 2021-09-28 DIAGNOSIS — G9341 Metabolic encephalopathy: Secondary | ICD-10-CM | POA: Diagnosis not present

## 2021-09-29 DIAGNOSIS — R2689 Other abnormalities of gait and mobility: Secondary | ICD-10-CM | POA: Diagnosis not present

## 2021-09-29 DIAGNOSIS — G9341 Metabolic encephalopathy: Secondary | ICD-10-CM | POA: Diagnosis not present

## 2021-09-29 DIAGNOSIS — M6281 Muscle weakness (generalized): Secondary | ICD-10-CM | POA: Diagnosis not present

## 2021-09-30 DIAGNOSIS — E1169 Type 2 diabetes mellitus with other specified complication: Secondary | ICD-10-CM | POA: Diagnosis not present

## 2021-09-30 DIAGNOSIS — E119 Type 2 diabetes mellitus without complications: Secondary | ICD-10-CM | POA: Diagnosis not present

## 2021-09-30 DIAGNOSIS — M6281 Muscle weakness (generalized): Secondary | ICD-10-CM | POA: Diagnosis not present

## 2021-09-30 DIAGNOSIS — G9341 Metabolic encephalopathy: Secondary | ICD-10-CM | POA: Diagnosis not present

## 2021-09-30 DIAGNOSIS — I82532 Chronic embolism and thrombosis of left popliteal vein: Secondary | ICD-10-CM | POA: Diagnosis not present

## 2021-09-30 DIAGNOSIS — H524 Presbyopia: Secondary | ICD-10-CM | POA: Diagnosis not present

## 2021-09-30 DIAGNOSIS — E785 Hyperlipidemia, unspecified: Secondary | ICD-10-CM | POA: Diagnosis not present

## 2021-09-30 DIAGNOSIS — I5022 Chronic systolic (congestive) heart failure: Secondary | ICD-10-CM | POA: Diagnosis not present

## 2021-09-30 DIAGNOSIS — R2689 Other abnormalities of gait and mobility: Secondary | ICD-10-CM | POA: Diagnosis not present

## 2021-09-30 DIAGNOSIS — H2513 Age-related nuclear cataract, bilateral: Secondary | ICD-10-CM | POA: Diagnosis not present

## 2021-10-01 DIAGNOSIS — N179 Acute kidney failure, unspecified: Secondary | ICD-10-CM | POA: Diagnosis not present

## 2021-10-01 DIAGNOSIS — M6281 Muscle weakness (generalized): Secondary | ICD-10-CM | POA: Diagnosis not present

## 2021-10-01 DIAGNOSIS — I10 Essential (primary) hypertension: Secondary | ICD-10-CM | POA: Diagnosis not present

## 2021-10-01 DIAGNOSIS — R2689 Other abnormalities of gait and mobility: Secondary | ICD-10-CM | POA: Diagnosis not present

## 2021-10-01 DIAGNOSIS — R7309 Other abnormal glucose: Secondary | ICD-10-CM | POA: Diagnosis not present

## 2021-10-01 DIAGNOSIS — G9341 Metabolic encephalopathy: Secondary | ICD-10-CM | POA: Diagnosis not present

## 2021-10-04 DIAGNOSIS — M6281 Muscle weakness (generalized): Secondary | ICD-10-CM | POA: Diagnosis not present

## 2021-10-04 DIAGNOSIS — R2689 Other abnormalities of gait and mobility: Secondary | ICD-10-CM | POA: Diagnosis not present

## 2021-10-04 DIAGNOSIS — G9341 Metabolic encephalopathy: Secondary | ICD-10-CM | POA: Diagnosis not present

## 2021-10-05 DIAGNOSIS — M6281 Muscle weakness (generalized): Secondary | ICD-10-CM | POA: Diagnosis not present

## 2021-10-05 DIAGNOSIS — R2689 Other abnormalities of gait and mobility: Secondary | ICD-10-CM | POA: Diagnosis not present

## 2021-10-05 DIAGNOSIS — N179 Acute kidney failure, unspecified: Secondary | ICD-10-CM | POA: Diagnosis not present

## 2021-10-05 DIAGNOSIS — R2681 Unsteadiness on feet: Secondary | ICD-10-CM | POA: Diagnosis not present

## 2021-10-05 DIAGNOSIS — G9341 Metabolic encephalopathy: Secondary | ICD-10-CM | POA: Diagnosis not present

## 2021-10-06 DIAGNOSIS — M6281 Muscle weakness (generalized): Secondary | ICD-10-CM | POA: Diagnosis not present

## 2021-10-06 DIAGNOSIS — G9341 Metabolic encephalopathy: Secondary | ICD-10-CM | POA: Diagnosis not present

## 2021-10-06 DIAGNOSIS — R2689 Other abnormalities of gait and mobility: Secondary | ICD-10-CM | POA: Diagnosis not present

## 2021-10-07 DIAGNOSIS — R2689 Other abnormalities of gait and mobility: Secondary | ICD-10-CM | POA: Diagnosis not present

## 2021-10-07 DIAGNOSIS — M6281 Muscle weakness (generalized): Secondary | ICD-10-CM | POA: Diagnosis not present

## 2021-10-07 DIAGNOSIS — G9341 Metabolic encephalopathy: Secondary | ICD-10-CM | POA: Diagnosis not present

## 2021-10-08 DIAGNOSIS — G9341 Metabolic encephalopathy: Secondary | ICD-10-CM | POA: Diagnosis not present

## 2021-10-08 DIAGNOSIS — M6281 Muscle weakness (generalized): Secondary | ICD-10-CM | POA: Diagnosis not present

## 2021-10-08 DIAGNOSIS — R2689 Other abnormalities of gait and mobility: Secondary | ICD-10-CM | POA: Diagnosis not present

## 2021-10-11 DIAGNOSIS — M6281 Muscle weakness (generalized): Secondary | ICD-10-CM | POA: Diagnosis not present

## 2021-10-11 DIAGNOSIS — G9341 Metabolic encephalopathy: Secondary | ICD-10-CM | POA: Diagnosis not present

## 2021-10-11 DIAGNOSIS — R2689 Other abnormalities of gait and mobility: Secondary | ICD-10-CM | POA: Diagnosis not present

## 2021-10-12 DIAGNOSIS — R2689 Other abnormalities of gait and mobility: Secondary | ICD-10-CM | POA: Diagnosis not present

## 2021-10-12 DIAGNOSIS — R2681 Unsteadiness on feet: Secondary | ICD-10-CM | POA: Diagnosis not present

## 2021-10-12 DIAGNOSIS — N179 Acute kidney failure, unspecified: Secondary | ICD-10-CM | POA: Diagnosis not present

## 2021-10-12 DIAGNOSIS — M6281 Muscle weakness (generalized): Secondary | ICD-10-CM | POA: Diagnosis not present

## 2021-10-12 DIAGNOSIS — G9341 Metabolic encephalopathy: Secondary | ICD-10-CM | POA: Diagnosis not present

## 2021-10-13 DIAGNOSIS — I82532 Chronic embolism and thrombosis of left popliteal vein: Secondary | ICD-10-CM | POA: Diagnosis not present

## 2021-10-13 DIAGNOSIS — E1169 Type 2 diabetes mellitus with other specified complication: Secondary | ICD-10-CM | POA: Diagnosis not present

## 2021-10-13 DIAGNOSIS — R2689 Other abnormalities of gait and mobility: Secondary | ICD-10-CM | POA: Diagnosis not present

## 2021-10-13 DIAGNOSIS — M6281 Muscle weakness (generalized): Secondary | ICD-10-CM | POA: Diagnosis not present

## 2021-10-13 DIAGNOSIS — I480 Paroxysmal atrial fibrillation: Secondary | ICD-10-CM | POA: Diagnosis not present

## 2021-10-13 DIAGNOSIS — I5022 Chronic systolic (congestive) heart failure: Secondary | ICD-10-CM | POA: Diagnosis not present

## 2021-10-13 DIAGNOSIS — G9341 Metabolic encephalopathy: Secondary | ICD-10-CM | POA: Diagnosis not present

## 2021-10-14 DIAGNOSIS — G9341 Metabolic encephalopathy: Secondary | ICD-10-CM | POA: Diagnosis not present

## 2021-10-14 DIAGNOSIS — R2689 Other abnormalities of gait and mobility: Secondary | ICD-10-CM | POA: Diagnosis not present

## 2021-10-14 DIAGNOSIS — M6281 Muscle weakness (generalized): Secondary | ICD-10-CM | POA: Diagnosis not present

## 2021-10-15 ENCOUNTER — Other Ambulatory Visit (HOSPITAL_COMMUNITY): Payer: Self-pay

## 2021-10-15 DIAGNOSIS — I5022 Chronic systolic (congestive) heart failure: Secondary | ICD-10-CM | POA: Diagnosis not present

## 2021-10-15 DIAGNOSIS — M6281 Muscle weakness (generalized): Secondary | ICD-10-CM | POA: Diagnosis not present

## 2021-10-15 MED ORDER — ROSUVASTATIN CALCIUM 5 MG PO TABS
5.0000 mg | ORAL_TABLET | Freq: Every day | ORAL | 0 refills | Status: AC
  Filled 2021-10-15: qty 30, 30d supply, fill #0

## 2021-10-15 MED ORDER — LOSARTAN POTASSIUM 25 MG PO TABS
12.5000 mg | ORAL_TABLET | Freq: Every day | ORAL | 0 refills | Status: DC
  Filled 2021-10-15 (×2): qty 15, 30d supply, fill #0

## 2021-10-15 MED ORDER — ELIQUIS 5 MG PO TABS
5.0000 mg | ORAL_TABLET | Freq: Two times a day (BID) | ORAL | 0 refills | Status: DC
  Filled 2021-10-15: qty 60, 30d supply, fill #0

## 2021-10-15 MED ORDER — METOPROLOL TARTRATE 25 MG PO TABS
25.0000 mg | ORAL_TABLET | Freq: Two times a day (BID) | ORAL | 0 refills | Status: DC
  Filled 2021-10-15: qty 60, 30d supply, fill #0

## 2021-10-15 MED ORDER — FAMOTIDINE 20 MG PO TABS
20.0000 mg | ORAL_TABLET | Freq: Every day | ORAL | 0 refills | Status: DC
  Filled 2021-10-15: qty 30, 30d supply, fill #0

## 2021-10-15 MED ORDER — SPIRONOLACTONE 25 MG PO TABS
12.5000 mg | ORAL_TABLET | Freq: Every day | ORAL | 0 refills | Status: DC
  Filled 2021-10-15: qty 15, 30d supply, fill #0

## 2021-10-15 MED ORDER — MIRTAZAPINE 15 MG PO TABS
15.0000 mg | ORAL_TABLET | Freq: Every day | ORAL | 0 refills | Status: DC
  Filled 2021-10-15: qty 30, 30d supply, fill #0

## 2021-10-15 MED ORDER — AMIODARONE HCL 100 MG PO TABS
100.0000 mg | ORAL_TABLET | Freq: Every day | ORAL | 0 refills | Status: DC
Start: 2021-10-14 — End: 2021-11-23
  Filled 2021-10-15: qty 30, 30d supply, fill #0

## 2021-10-15 MED ORDER — OMEPRAZOLE 20 MG PO CPDR
20.0000 mg | DELAYED_RELEASE_CAPSULE | Freq: Every day | ORAL | 0 refills | Status: DC
  Filled 2021-10-15: qty 30, 30d supply, fill #0

## 2021-10-18 ENCOUNTER — Other Ambulatory Visit (HOSPITAL_COMMUNITY): Payer: Self-pay

## 2021-10-19 DIAGNOSIS — R2681 Unsteadiness on feet: Secondary | ICD-10-CM | POA: Diagnosis not present

## 2021-10-19 DIAGNOSIS — N179 Acute kidney failure, unspecified: Secondary | ICD-10-CM | POA: Diagnosis not present

## 2021-10-19 DIAGNOSIS — M6281 Muscle weakness (generalized): Secondary | ICD-10-CM | POA: Diagnosis not present

## 2021-10-19 DIAGNOSIS — G9341 Metabolic encephalopathy: Secondary | ICD-10-CM | POA: Diagnosis not present

## 2021-10-25 ENCOUNTER — Other Ambulatory Visit (HOSPITAL_COMMUNITY): Payer: Self-pay

## 2021-10-25 DIAGNOSIS — I110 Hypertensive heart disease with heart failure: Secondary | ICD-10-CM | POA: Diagnosis not present

## 2021-10-25 DIAGNOSIS — Z7901 Long term (current) use of anticoagulants: Secondary | ICD-10-CM | POA: Diagnosis not present

## 2021-10-25 DIAGNOSIS — R54 Age-related physical debility: Secondary | ICD-10-CM | POA: Diagnosis not present

## 2021-10-25 MED ORDER — SPIRONOLACTONE 25 MG PO TABS
25.0000 mg | ORAL_TABLET | Freq: Every day | ORAL | 2 refills | Status: DC
  Filled 2021-10-25: qty 30, 30d supply, fill #0
  Filled 2021-11-20: qty 30, 30d supply, fill #1
  Filled 2021-12-08: qty 30, 30d supply, fill #2

## 2021-10-29 ENCOUNTER — Other Ambulatory Visit (HOSPITAL_COMMUNITY): Payer: Self-pay

## 2021-10-29 MED ORDER — LOSARTAN POTASSIUM 25 MG PO TABS
12.5000 mg | ORAL_TABLET | Freq: Every day | ORAL | 2 refills | Status: DC
Start: 2021-10-29 — End: 2022-02-01
  Filled 2021-10-29: qty 15, 30d supply, fill #0
  Filled 2021-11-16 – 2021-11-20 (×2): qty 15, 30d supply, fill #1
  Filled 2021-12-21: qty 15, 30d supply, fill #2

## 2021-11-01 DIAGNOSIS — E43 Unspecified severe protein-calorie malnutrition: Secondary | ICD-10-CM | POA: Diagnosis not present

## 2021-11-01 DIAGNOSIS — E860 Dehydration: Secondary | ICD-10-CM | POA: Diagnosis not present

## 2021-11-02 ENCOUNTER — Other Ambulatory Visit (HOSPITAL_COMMUNITY): Payer: Self-pay

## 2021-11-03 DIAGNOSIS — I4891 Unspecified atrial fibrillation: Secondary | ICD-10-CM | POA: Diagnosis not present

## 2021-11-03 DIAGNOSIS — I6340 Cerebral infarction due to embolism of unspecified cerebral artery: Secondary | ICD-10-CM | POA: Diagnosis not present

## 2021-11-03 DIAGNOSIS — N179 Acute kidney failure, unspecified: Secondary | ICD-10-CM | POA: Diagnosis not present

## 2021-11-03 DIAGNOSIS — I2699 Other pulmonary embolism without acute cor pulmonale: Secondary | ICD-10-CM | POA: Diagnosis not present

## 2021-11-03 DIAGNOSIS — G9341 Metabolic encephalopathy: Secondary | ICD-10-CM | POA: Diagnosis not present

## 2021-11-03 DIAGNOSIS — I5033 Acute on chronic diastolic (congestive) heart failure: Secondary | ICD-10-CM | POA: Diagnosis not present

## 2021-11-05 DIAGNOSIS — E43 Unspecified severe protein-calorie malnutrition: Secondary | ICD-10-CM | POA: Diagnosis not present

## 2021-11-05 DIAGNOSIS — E119 Type 2 diabetes mellitus without complications: Secondary | ICD-10-CM | POA: Diagnosis not present

## 2021-11-05 DIAGNOSIS — E860 Dehydration: Secondary | ICD-10-CM | POA: Diagnosis not present

## 2021-11-11 DIAGNOSIS — G9341 Metabolic encephalopathy: Secondary | ICD-10-CM | POA: Diagnosis not present

## 2021-11-11 DIAGNOSIS — I4891 Unspecified atrial fibrillation: Secondary | ICD-10-CM | POA: Diagnosis not present

## 2021-11-11 DIAGNOSIS — N179 Acute kidney failure, unspecified: Secondary | ICD-10-CM | POA: Diagnosis not present

## 2021-11-11 DIAGNOSIS — I2699 Other pulmonary embolism without acute cor pulmonale: Secondary | ICD-10-CM | POA: Diagnosis not present

## 2021-11-11 DIAGNOSIS — I6340 Cerebral infarction due to embolism of unspecified cerebral artery: Secondary | ICD-10-CM | POA: Diagnosis not present

## 2021-11-11 DIAGNOSIS — I5033 Acute on chronic diastolic (congestive) heart failure: Secondary | ICD-10-CM | POA: Diagnosis not present

## 2021-11-12 DIAGNOSIS — R946 Abnormal results of thyroid function studies: Secondary | ICD-10-CM | POA: Diagnosis not present

## 2021-11-12 DIAGNOSIS — I6340 Cerebral infarction due to embolism of unspecified cerebral artery: Secondary | ICD-10-CM | POA: Diagnosis not present

## 2021-11-12 DIAGNOSIS — I5033 Acute on chronic diastolic (congestive) heart failure: Secondary | ICD-10-CM | POA: Diagnosis not present

## 2021-11-12 DIAGNOSIS — E119 Type 2 diabetes mellitus without complications: Secondary | ICD-10-CM | POA: Diagnosis not present

## 2021-11-12 DIAGNOSIS — N179 Acute kidney failure, unspecified: Secondary | ICD-10-CM | POA: Diagnosis not present

## 2021-11-12 DIAGNOSIS — R54 Age-related physical debility: Secondary | ICD-10-CM | POA: Diagnosis not present

## 2021-11-12 DIAGNOSIS — I82532 Chronic embolism and thrombosis of left popliteal vein: Secondary | ICD-10-CM | POA: Diagnosis not present

## 2021-11-12 DIAGNOSIS — Z7901 Long term (current) use of anticoagulants: Secondary | ICD-10-CM | POA: Diagnosis not present

## 2021-11-12 DIAGNOSIS — G9341 Metabolic encephalopathy: Secondary | ICD-10-CM | POA: Diagnosis not present

## 2021-11-12 DIAGNOSIS — I4891 Unspecified atrial fibrillation: Secondary | ICD-10-CM | POA: Diagnosis not present

## 2021-11-12 DIAGNOSIS — E860 Dehydration: Secondary | ICD-10-CM | POA: Diagnosis not present

## 2021-11-12 DIAGNOSIS — I2699 Other pulmonary embolism without acute cor pulmonale: Secondary | ICD-10-CM | POA: Diagnosis not present

## 2021-11-16 ENCOUNTER — Other Ambulatory Visit (HOSPITAL_COMMUNITY): Payer: Self-pay

## 2021-11-16 DIAGNOSIS — I4891 Unspecified atrial fibrillation: Secondary | ICD-10-CM | POA: Diagnosis not present

## 2021-11-16 DIAGNOSIS — I6340 Cerebral infarction due to embolism of unspecified cerebral artery: Secondary | ICD-10-CM | POA: Diagnosis not present

## 2021-11-16 DIAGNOSIS — I5033 Acute on chronic diastolic (congestive) heart failure: Secondary | ICD-10-CM | POA: Diagnosis not present

## 2021-11-16 DIAGNOSIS — G9341 Metabolic encephalopathy: Secondary | ICD-10-CM | POA: Diagnosis not present

## 2021-11-16 DIAGNOSIS — N179 Acute kidney failure, unspecified: Secondary | ICD-10-CM | POA: Diagnosis not present

## 2021-11-16 DIAGNOSIS — I2699 Other pulmonary embolism without acute cor pulmonale: Secondary | ICD-10-CM | POA: Diagnosis not present

## 2021-11-17 ENCOUNTER — Other Ambulatory Visit (HOSPITAL_COMMUNITY): Payer: Self-pay

## 2021-11-17 DIAGNOSIS — I5022 Chronic systolic (congestive) heart failure: Secondary | ICD-10-CM | POA: Diagnosis not present

## 2021-11-17 DIAGNOSIS — M6281 Muscle weakness (generalized): Secondary | ICD-10-CM | POA: Diagnosis not present

## 2021-11-18 ENCOUNTER — Other Ambulatory Visit (HOSPITAL_COMMUNITY): Payer: Self-pay

## 2021-11-19 ENCOUNTER — Other Ambulatory Visit (HOSPITAL_COMMUNITY): Payer: Self-pay

## 2021-11-19 DIAGNOSIS — I110 Hypertensive heart disease with heart failure: Secondary | ICD-10-CM | POA: Diagnosis not present

## 2021-11-19 DIAGNOSIS — E785 Hyperlipidemia, unspecified: Secondary | ICD-10-CM | POA: Diagnosis not present

## 2021-11-19 DIAGNOSIS — I5033 Acute on chronic diastolic (congestive) heart failure: Secondary | ICD-10-CM | POA: Diagnosis not present

## 2021-11-19 DIAGNOSIS — R238 Other skin changes: Secondary | ICD-10-CM | POA: Diagnosis not present

## 2021-11-19 DIAGNOSIS — E1169 Type 2 diabetes mellitus with other specified complication: Secondary | ICD-10-CM | POA: Diagnosis not present

## 2021-11-19 DIAGNOSIS — E43 Unspecified severe protein-calorie malnutrition: Secondary | ICD-10-CM | POA: Diagnosis not present

## 2021-11-20 DIAGNOSIS — I5030 Unspecified diastolic (congestive) heart failure: Secondary | ICD-10-CM | POA: Diagnosis not present

## 2021-11-20 DIAGNOSIS — E1169 Type 2 diabetes mellitus with other specified complication: Secondary | ICD-10-CM | POA: Diagnosis not present

## 2021-11-20 DIAGNOSIS — D8481 Immunodeficiency due to conditions classified elsewhere: Secondary | ICD-10-CM | POA: Diagnosis not present

## 2021-11-20 DIAGNOSIS — S90822A Blister (nonthermal), left foot, initial encounter: Secondary | ICD-10-CM | POA: Diagnosis not present

## 2021-11-20 DIAGNOSIS — Z7901 Long term (current) use of anticoagulants: Secondary | ICD-10-CM | POA: Diagnosis not present

## 2021-11-22 ENCOUNTER — Other Ambulatory Visit (HOSPITAL_COMMUNITY): Payer: Self-pay

## 2021-11-23 ENCOUNTER — Other Ambulatory Visit (HOSPITAL_COMMUNITY): Payer: Self-pay

## 2021-11-23 DIAGNOSIS — E1169 Type 2 diabetes mellitus with other specified complication: Secondary | ICD-10-CM | POA: Diagnosis not present

## 2021-11-23 DIAGNOSIS — K219 Gastro-esophageal reflux disease without esophagitis: Secondary | ICD-10-CM | POA: Diagnosis not present

## 2021-11-23 DIAGNOSIS — E785 Hyperlipidemia, unspecified: Secondary | ICD-10-CM | POA: Diagnosis not present

## 2021-11-23 DIAGNOSIS — R238 Other skin changes: Secondary | ICD-10-CM | POA: Diagnosis not present

## 2021-11-23 DIAGNOSIS — I5033 Acute on chronic diastolic (congestive) heart failure: Secondary | ICD-10-CM | POA: Diagnosis not present

## 2021-11-23 DIAGNOSIS — I110 Hypertensive heart disease with heart failure: Secondary | ICD-10-CM | POA: Diagnosis not present

## 2021-11-23 DIAGNOSIS — Z7901 Long term (current) use of anticoagulants: Secondary | ICD-10-CM | POA: Diagnosis not present

## 2021-11-23 DIAGNOSIS — E43 Unspecified severe protein-calorie malnutrition: Secondary | ICD-10-CM | POA: Diagnosis not present

## 2021-11-23 DIAGNOSIS — I482 Chronic atrial fibrillation, unspecified: Secondary | ICD-10-CM | POA: Diagnosis not present

## 2021-11-23 MED ORDER — SPIRONOLACTONE 25 MG PO TABS
25.0000 mg | ORAL_TABLET | Freq: Every day | ORAL | 3 refills | Status: DC
Start: 2021-11-23 — End: 2022-03-26
  Filled 2021-11-23: qty 30, 30d supply, fill #0

## 2021-11-23 MED ORDER — FAMOTIDINE 20 MG PO TABS
20.0000 mg | ORAL_TABLET | Freq: Every evening | ORAL | 3 refills | Status: AC | PRN
  Filled 2021-11-23: qty 30, 30d supply, fill #0
  Filled 2021-12-21: qty 30, 30d supply, fill #1
  Filled 2022-01-30: qty 30, 30d supply, fill #2

## 2021-11-23 MED ORDER — FUROSEMIDE 20 MG PO TABS
20.0000 mg | ORAL_TABLET | Freq: Every day | ORAL | 0 refills | Status: DC | PRN
Start: 2021-11-23 — End: 2021-12-09
  Filled 2021-11-23: qty 30, 30d supply, fill #0

## 2021-11-23 MED ORDER — AMIODARONE HCL 100 MG PO TABS
100.0000 mg | ORAL_TABLET | Freq: Every day | ORAL | 3 refills | Status: DC
  Filled 2021-11-23: qty 30, 30d supply, fill #0
  Filled 2021-12-21 (×2): qty 30, 30d supply, fill #1

## 2021-11-23 MED ORDER — LOSARTAN POTASSIUM 25 MG PO TABS
12.5000 mg | ORAL_TABLET | Freq: Every day | ORAL | 3 refills | Status: DC
  Filled 2021-11-23: qty 15, 30d supply, fill #0
  Filled 2022-02-01: qty 30, 60d supply, fill #0
  Filled 2022-03-23: qty 30, 60d supply, fill #1

## 2021-11-23 MED ORDER — MIRTAZAPINE 15 MG PO TABS
15.0000 mg | ORAL_TABLET | Freq: Every day | ORAL | 3 refills | Status: AC
  Filled 2021-11-23: qty 30, 30d supply, fill #0
  Filled 2022-01-05: qty 30, 30d supply, fill #1
  Filled 2022-01-30: qty 30, 30d supply, fill #2

## 2021-11-23 MED ORDER — ELIQUIS 5 MG PO TABS
5.0000 mg | ORAL_TABLET | Freq: Two times a day (BID) | ORAL | 3 refills | Status: DC
  Filled 2021-11-23: qty 60, 30d supply, fill #0
  Filled 2021-12-21: qty 60, 30d supply, fill #1
  Filled 2022-01-19: qty 60, 30d supply, fill #2
  Filled 2022-02-27: qty 60, 30d supply, fill #3

## 2021-11-23 MED ORDER — METOPROLOL SUCCINATE ER 25 MG PO TB24
25.0000 mg | ORAL_TABLET | Freq: Every day | ORAL | 3 refills | Status: DC
Start: 2021-11-23 — End: 2022-04-05
  Filled 2021-11-23: qty 30, 30d supply, fill #0
  Filled 2021-12-21: qty 30, 30d supply, fill #1
  Filled 2022-01-19: qty 30, 30d supply, fill #2
  Filled 2022-02-22: qty 30, 30d supply, fill #3

## 2021-11-25 ENCOUNTER — Other Ambulatory Visit (HOSPITAL_COMMUNITY): Payer: Self-pay

## 2021-11-25 DIAGNOSIS — R238 Other skin changes: Secondary | ICD-10-CM | POA: Diagnosis not present

## 2021-11-25 DIAGNOSIS — E785 Hyperlipidemia, unspecified: Secondary | ICD-10-CM | POA: Diagnosis not present

## 2021-11-25 DIAGNOSIS — E1169 Type 2 diabetes mellitus with other specified complication: Secondary | ICD-10-CM | POA: Diagnosis not present

## 2021-11-25 DIAGNOSIS — E43 Unspecified severe protein-calorie malnutrition: Secondary | ICD-10-CM | POA: Diagnosis not present

## 2021-11-25 DIAGNOSIS — I110 Hypertensive heart disease with heart failure: Secondary | ICD-10-CM | POA: Diagnosis not present

## 2021-11-25 DIAGNOSIS — I5033 Acute on chronic diastolic (congestive) heart failure: Secondary | ICD-10-CM | POA: Diagnosis not present

## 2021-11-26 ENCOUNTER — Other Ambulatory Visit (HOSPITAL_COMMUNITY): Payer: Self-pay

## 2021-11-26 DIAGNOSIS — I5030 Unspecified diastolic (congestive) heart failure: Secondary | ICD-10-CM | POA: Diagnosis not present

## 2021-11-29 DIAGNOSIS — I5022 Chronic systolic (congestive) heart failure: Secondary | ICD-10-CM | POA: Diagnosis not present

## 2021-11-29 DIAGNOSIS — S90821D Blister (nonthermal), right foot, subsequent encounter: Secondary | ICD-10-CM | POA: Diagnosis not present

## 2021-11-30 DIAGNOSIS — I5033 Acute on chronic diastolic (congestive) heart failure: Secondary | ICD-10-CM | POA: Diagnosis not present

## 2021-11-30 DIAGNOSIS — R238 Other skin changes: Secondary | ICD-10-CM | POA: Diagnosis not present

## 2021-11-30 DIAGNOSIS — E1169 Type 2 diabetes mellitus with other specified complication: Secondary | ICD-10-CM | POA: Diagnosis not present

## 2021-11-30 DIAGNOSIS — E43 Unspecified severe protein-calorie malnutrition: Secondary | ICD-10-CM | POA: Diagnosis not present

## 2021-11-30 DIAGNOSIS — E785 Hyperlipidemia, unspecified: Secondary | ICD-10-CM | POA: Diagnosis not present

## 2021-11-30 DIAGNOSIS — I110 Hypertensive heart disease with heart failure: Secondary | ICD-10-CM | POA: Diagnosis not present

## 2021-12-01 DIAGNOSIS — I5033 Acute on chronic diastolic (congestive) heart failure: Secondary | ICD-10-CM | POA: Diagnosis not present

## 2021-12-01 DIAGNOSIS — E1169 Type 2 diabetes mellitus with other specified complication: Secondary | ICD-10-CM | POA: Diagnosis not present

## 2021-12-01 DIAGNOSIS — I110 Hypertensive heart disease with heart failure: Secondary | ICD-10-CM | POA: Diagnosis not present

## 2021-12-01 DIAGNOSIS — R238 Other skin changes: Secondary | ICD-10-CM | POA: Diagnosis not present

## 2021-12-01 DIAGNOSIS — E43 Unspecified severe protein-calorie malnutrition: Secondary | ICD-10-CM | POA: Diagnosis not present

## 2021-12-01 DIAGNOSIS — E785 Hyperlipidemia, unspecified: Secondary | ICD-10-CM | POA: Diagnosis not present

## 2021-12-02 DIAGNOSIS — I5020 Unspecified systolic (congestive) heart failure: Secondary | ICD-10-CM | POA: Diagnosis not present

## 2021-12-03 DIAGNOSIS — E1169 Type 2 diabetes mellitus with other specified complication: Secondary | ICD-10-CM | POA: Diagnosis not present

## 2021-12-03 DIAGNOSIS — E43 Unspecified severe protein-calorie malnutrition: Secondary | ICD-10-CM | POA: Diagnosis not present

## 2021-12-03 DIAGNOSIS — I5033 Acute on chronic diastolic (congestive) heart failure: Secondary | ICD-10-CM | POA: Diagnosis not present

## 2021-12-03 DIAGNOSIS — I110 Hypertensive heart disease with heart failure: Secondary | ICD-10-CM | POA: Diagnosis not present

## 2021-12-03 DIAGNOSIS — R238 Other skin changes: Secondary | ICD-10-CM | POA: Diagnosis not present

## 2021-12-03 DIAGNOSIS — E785 Hyperlipidemia, unspecified: Secondary | ICD-10-CM | POA: Diagnosis not present

## 2021-12-07 ENCOUNTER — Other Ambulatory Visit (HOSPITAL_COMMUNITY): Payer: Self-pay

## 2021-12-07 DIAGNOSIS — I5033 Acute on chronic diastolic (congestive) heart failure: Secondary | ICD-10-CM | POA: Diagnosis not present

## 2021-12-07 DIAGNOSIS — R54 Age-related physical debility: Secondary | ICD-10-CM | POA: Diagnosis not present

## 2021-12-08 ENCOUNTER — Other Ambulatory Visit (HOSPITAL_COMMUNITY): Payer: Self-pay

## 2021-12-08 DIAGNOSIS — I5033 Acute on chronic diastolic (congestive) heart failure: Secondary | ICD-10-CM | POA: Diagnosis not present

## 2021-12-08 DIAGNOSIS — I110 Hypertensive heart disease with heart failure: Secondary | ICD-10-CM | POA: Diagnosis not present

## 2021-12-08 DIAGNOSIS — R238 Other skin changes: Secondary | ICD-10-CM | POA: Diagnosis not present

## 2021-12-08 DIAGNOSIS — E1169 Type 2 diabetes mellitus with other specified complication: Secondary | ICD-10-CM | POA: Diagnosis not present

## 2021-12-08 DIAGNOSIS — E785 Hyperlipidemia, unspecified: Secondary | ICD-10-CM | POA: Diagnosis not present

## 2021-12-08 DIAGNOSIS — E43 Unspecified severe protein-calorie malnutrition: Secondary | ICD-10-CM | POA: Diagnosis not present

## 2021-12-09 ENCOUNTER — Other Ambulatory Visit (HOSPITAL_COMMUNITY): Payer: Self-pay

## 2021-12-09 DIAGNOSIS — I2510 Atherosclerotic heart disease of native coronary artery without angina pectoris: Secondary | ICD-10-CM | POA: Diagnosis not present

## 2021-12-09 DIAGNOSIS — R54 Age-related physical debility: Secondary | ICD-10-CM | POA: Diagnosis not present

## 2021-12-09 DIAGNOSIS — I4891 Unspecified atrial fibrillation: Secondary | ICD-10-CM | POA: Diagnosis not present

## 2021-12-09 MED ORDER — FUROSEMIDE 20 MG PO TABS
20.0000 mg | ORAL_TABLET | Freq: Every day | ORAL | 0 refills | Status: DC | PRN
  Filled 2021-12-09: qty 30, 30d supply, fill #0

## 2021-12-10 ENCOUNTER — Other Ambulatory Visit (HOSPITAL_COMMUNITY): Payer: Self-pay

## 2021-12-10 DIAGNOSIS — R7989 Other specified abnormal findings of blood chemistry: Secondary | ICD-10-CM | POA: Diagnosis not present

## 2021-12-10 DIAGNOSIS — I4891 Unspecified atrial fibrillation: Secondary | ICD-10-CM | POA: Diagnosis not present

## 2021-12-10 DIAGNOSIS — I5030 Unspecified diastolic (congestive) heart failure: Secondary | ICD-10-CM | POA: Diagnosis not present

## 2021-12-10 MED ORDER — SPIRONOLACTONE 25 MG PO TABS
25.0000 mg | ORAL_TABLET | Freq: Every day | ORAL | 0 refills | Status: DC
  Filled 2021-12-10 – 2022-01-06 (×2): qty 90, 90d supply, fill #0

## 2021-12-10 MED ORDER — AMIODARONE HCL 100 MG PO TABS
100.0000 mg | ORAL_TABLET | Freq: Every day | ORAL | 0 refills | Status: DC
  Filled 2021-12-10: qty 90, 90d supply, fill #0

## 2021-12-10 MED ORDER — LEVOTHYROXINE SODIUM 25 MCG PO TABS
25.0000 ug | ORAL_TABLET | Freq: Every morning | ORAL | 0 refills | Status: DC
  Filled 2021-12-10: qty 30, 30d supply, fill #0

## 2021-12-13 ENCOUNTER — Other Ambulatory Visit (HOSPITAL_COMMUNITY): Payer: Self-pay

## 2021-12-15 DIAGNOSIS — I5033 Acute on chronic diastolic (congestive) heart failure: Secondary | ICD-10-CM | POA: Diagnosis not present

## 2021-12-15 DIAGNOSIS — E785 Hyperlipidemia, unspecified: Secondary | ICD-10-CM | POA: Diagnosis not present

## 2021-12-15 DIAGNOSIS — E43 Unspecified severe protein-calorie malnutrition: Secondary | ICD-10-CM | POA: Diagnosis not present

## 2021-12-15 DIAGNOSIS — E1169 Type 2 diabetes mellitus with other specified complication: Secondary | ICD-10-CM | POA: Diagnosis not present

## 2021-12-15 DIAGNOSIS — I110 Hypertensive heart disease with heart failure: Secondary | ICD-10-CM | POA: Diagnosis not present

## 2021-12-15 DIAGNOSIS — R238 Other skin changes: Secondary | ICD-10-CM | POA: Diagnosis not present

## 2021-12-17 ENCOUNTER — Other Ambulatory Visit (HOSPITAL_COMMUNITY): Payer: Self-pay

## 2021-12-17 DIAGNOSIS — E1169 Type 2 diabetes mellitus with other specified complication: Secondary | ICD-10-CM | POA: Diagnosis not present

## 2021-12-17 DIAGNOSIS — E43 Unspecified severe protein-calorie malnutrition: Secondary | ICD-10-CM | POA: Diagnosis not present

## 2021-12-17 DIAGNOSIS — I110 Hypertensive heart disease with heart failure: Secondary | ICD-10-CM | POA: Diagnosis not present

## 2021-12-17 DIAGNOSIS — I5033 Acute on chronic diastolic (congestive) heart failure: Secondary | ICD-10-CM | POA: Diagnosis not present

## 2021-12-17 DIAGNOSIS — R238 Other skin changes: Secondary | ICD-10-CM | POA: Diagnosis not present

## 2021-12-17 DIAGNOSIS — E785 Hyperlipidemia, unspecified: Secondary | ICD-10-CM | POA: Diagnosis not present

## 2021-12-20 ENCOUNTER — Other Ambulatory Visit (HOSPITAL_COMMUNITY): Payer: Self-pay

## 2021-12-21 ENCOUNTER — Other Ambulatory Visit (HOSPITAL_COMMUNITY): Payer: Self-pay

## 2021-12-21 MED ORDER — AMIODARONE HCL 200 MG PO TABS
100.0000 mg | ORAL_TABLET | Freq: Every day | ORAL | 3 refills | Status: DC
  Filled 2021-12-21: qty 15, 30d supply, fill #0
  Filled 2022-01-31: qty 15, 30d supply, fill #1
  Filled 2022-02-22: qty 15, 30d supply, fill #2
  Filled 2022-03-23: qty 15, 30d supply, fill #3

## 2021-12-22 ENCOUNTER — Other Ambulatory Visit (HOSPITAL_COMMUNITY): Payer: Self-pay

## 2021-12-22 DIAGNOSIS — E1169 Type 2 diabetes mellitus with other specified complication: Secondary | ICD-10-CM | POA: Diagnosis not present

## 2021-12-22 DIAGNOSIS — E785 Hyperlipidemia, unspecified: Secondary | ICD-10-CM | POA: Diagnosis not present

## 2021-12-22 DIAGNOSIS — I110 Hypertensive heart disease with heart failure: Secondary | ICD-10-CM | POA: Diagnosis not present

## 2021-12-22 DIAGNOSIS — R238 Other skin changes: Secondary | ICD-10-CM | POA: Diagnosis not present

## 2021-12-22 DIAGNOSIS — I5042 Chronic combined systolic (congestive) and diastolic (congestive) heart failure: Secondary | ICD-10-CM | POA: Diagnosis not present

## 2021-12-22 DIAGNOSIS — R7989 Other specified abnormal findings of blood chemistry: Secondary | ICD-10-CM | POA: Diagnosis not present

## 2021-12-22 DIAGNOSIS — Z7901 Long term (current) use of anticoagulants: Secondary | ICD-10-CM | POA: Diagnosis not present

## 2021-12-22 DIAGNOSIS — Z8673 Personal history of transient ischemic attack (TIA), and cerebral infarction without residual deficits: Secondary | ICD-10-CM | POA: Diagnosis not present

## 2021-12-22 DIAGNOSIS — K219 Gastro-esophageal reflux disease without esophagitis: Secondary | ICD-10-CM | POA: Diagnosis not present

## 2021-12-22 DIAGNOSIS — E43 Unspecified severe protein-calorie malnutrition: Secondary | ICD-10-CM | POA: Diagnosis not present

## 2021-12-22 MED ORDER — POTASSIUM CHLORIDE ER 10 MEQ PO TBCR
10.0000 meq | EXTENDED_RELEASE_TABLET | Freq: Every day | ORAL | 1 refills | Status: AC
  Filled 2021-12-22: qty 30, 30d supply, fill #0

## 2021-12-23 DIAGNOSIS — E1169 Type 2 diabetes mellitus with other specified complication: Secondary | ICD-10-CM | POA: Diagnosis not present

## 2021-12-23 DIAGNOSIS — R238 Other skin changes: Secondary | ICD-10-CM | POA: Diagnosis not present

## 2021-12-23 DIAGNOSIS — I110 Hypertensive heart disease with heart failure: Secondary | ICD-10-CM | POA: Diagnosis not present

## 2021-12-23 DIAGNOSIS — E785 Hyperlipidemia, unspecified: Secondary | ICD-10-CM | POA: Diagnosis not present

## 2021-12-23 DIAGNOSIS — E43 Unspecified severe protein-calorie malnutrition: Secondary | ICD-10-CM | POA: Diagnosis not present

## 2021-12-23 DIAGNOSIS — I5042 Chronic combined systolic (congestive) and diastolic (congestive) heart failure: Secondary | ICD-10-CM | POA: Diagnosis not present

## 2021-12-29 DIAGNOSIS — E1169 Type 2 diabetes mellitus with other specified complication: Secondary | ICD-10-CM | POA: Diagnosis not present

## 2021-12-29 DIAGNOSIS — E43 Unspecified severe protein-calorie malnutrition: Secondary | ICD-10-CM | POA: Diagnosis not present

## 2021-12-29 DIAGNOSIS — I5042 Chronic combined systolic (congestive) and diastolic (congestive) heart failure: Secondary | ICD-10-CM | POA: Diagnosis not present

## 2021-12-29 DIAGNOSIS — I110 Hypertensive heart disease with heart failure: Secondary | ICD-10-CM | POA: Diagnosis not present

## 2021-12-29 DIAGNOSIS — E785 Hyperlipidemia, unspecified: Secondary | ICD-10-CM | POA: Diagnosis not present

## 2021-12-29 DIAGNOSIS — R238 Other skin changes: Secondary | ICD-10-CM | POA: Diagnosis not present

## 2021-12-30 ENCOUNTER — Other Ambulatory Visit (HOSPITAL_COMMUNITY): Payer: Self-pay

## 2021-12-31 DIAGNOSIS — E785 Hyperlipidemia, unspecified: Secondary | ICD-10-CM | POA: Diagnosis not present

## 2021-12-31 DIAGNOSIS — E1169 Type 2 diabetes mellitus with other specified complication: Secondary | ICD-10-CM | POA: Diagnosis not present

## 2021-12-31 DIAGNOSIS — I110 Hypertensive heart disease with heart failure: Secondary | ICD-10-CM | POA: Diagnosis not present

## 2021-12-31 DIAGNOSIS — E43 Unspecified severe protein-calorie malnutrition: Secondary | ICD-10-CM | POA: Diagnosis not present

## 2021-12-31 DIAGNOSIS — R238 Other skin changes: Secondary | ICD-10-CM | POA: Diagnosis not present

## 2021-12-31 DIAGNOSIS — I5042 Chronic combined systolic (congestive) and diastolic (congestive) heart failure: Secondary | ICD-10-CM | POA: Diagnosis not present

## 2022-01-04 DIAGNOSIS — E785 Hyperlipidemia, unspecified: Secondary | ICD-10-CM | POA: Diagnosis not present

## 2022-01-04 DIAGNOSIS — E43 Unspecified severe protein-calorie malnutrition: Secondary | ICD-10-CM | POA: Diagnosis not present

## 2022-01-04 DIAGNOSIS — E1169 Type 2 diabetes mellitus with other specified complication: Secondary | ICD-10-CM | POA: Diagnosis not present

## 2022-01-04 DIAGNOSIS — I5042 Chronic combined systolic (congestive) and diastolic (congestive) heart failure: Secondary | ICD-10-CM | POA: Diagnosis not present

## 2022-01-04 DIAGNOSIS — R238 Other skin changes: Secondary | ICD-10-CM | POA: Diagnosis not present

## 2022-01-04 DIAGNOSIS — I110 Hypertensive heart disease with heart failure: Secondary | ICD-10-CM | POA: Diagnosis not present

## 2022-01-05 ENCOUNTER — Other Ambulatory Visit (HOSPITAL_COMMUNITY): Payer: Self-pay

## 2022-01-05 DIAGNOSIS — I5042 Chronic combined systolic (congestive) and diastolic (congestive) heart failure: Secondary | ICD-10-CM | POA: Diagnosis not present

## 2022-01-05 DIAGNOSIS — I110 Hypertensive heart disease with heart failure: Secondary | ICD-10-CM | POA: Diagnosis not present

## 2022-01-05 DIAGNOSIS — E785 Hyperlipidemia, unspecified: Secondary | ICD-10-CM | POA: Diagnosis not present

## 2022-01-05 DIAGNOSIS — E43 Unspecified severe protein-calorie malnutrition: Secondary | ICD-10-CM | POA: Diagnosis not present

## 2022-01-05 DIAGNOSIS — R238 Other skin changes: Secondary | ICD-10-CM | POA: Diagnosis not present

## 2022-01-05 DIAGNOSIS — E1169 Type 2 diabetes mellitus with other specified complication: Secondary | ICD-10-CM | POA: Diagnosis not present

## 2022-01-06 ENCOUNTER — Other Ambulatory Visit (HOSPITAL_COMMUNITY): Payer: Self-pay

## 2022-01-06 DIAGNOSIS — I5042 Chronic combined systolic (congestive) and diastolic (congestive) heart failure: Secondary | ICD-10-CM | POA: Diagnosis not present

## 2022-01-06 DIAGNOSIS — E1169 Type 2 diabetes mellitus with other specified complication: Secondary | ICD-10-CM | POA: Diagnosis not present

## 2022-01-06 DIAGNOSIS — I110 Hypertensive heart disease with heart failure: Secondary | ICD-10-CM | POA: Diagnosis not present

## 2022-01-06 DIAGNOSIS — R238 Other skin changes: Secondary | ICD-10-CM | POA: Diagnosis not present

## 2022-01-06 DIAGNOSIS — E785 Hyperlipidemia, unspecified: Secondary | ICD-10-CM | POA: Diagnosis not present

## 2022-01-06 DIAGNOSIS — E43 Unspecified severe protein-calorie malnutrition: Secondary | ICD-10-CM | POA: Diagnosis not present

## 2022-01-07 ENCOUNTER — Other Ambulatory Visit (HOSPITAL_COMMUNITY): Payer: Self-pay

## 2022-01-07 DIAGNOSIS — I5033 Acute on chronic diastolic (congestive) heart failure: Secondary | ICD-10-CM | POA: Diagnosis not present

## 2022-01-07 DIAGNOSIS — R54 Age-related physical debility: Secondary | ICD-10-CM | POA: Diagnosis not present

## 2022-01-10 DIAGNOSIS — I2510 Atherosclerotic heart disease of native coronary artery without angina pectoris: Secondary | ICD-10-CM | POA: Diagnosis not present

## 2022-01-10 DIAGNOSIS — R54 Age-related physical debility: Secondary | ICD-10-CM | POA: Diagnosis not present

## 2022-01-10 DIAGNOSIS — I4891 Unspecified atrial fibrillation: Secondary | ICD-10-CM | POA: Diagnosis not present

## 2022-01-11 DIAGNOSIS — E785 Hyperlipidemia, unspecified: Secondary | ICD-10-CM | POA: Diagnosis not present

## 2022-01-11 DIAGNOSIS — E1169 Type 2 diabetes mellitus with other specified complication: Secondary | ICD-10-CM | POA: Diagnosis not present

## 2022-01-11 DIAGNOSIS — I110 Hypertensive heart disease with heart failure: Secondary | ICD-10-CM | POA: Diagnosis not present

## 2022-01-11 DIAGNOSIS — E43 Unspecified severe protein-calorie malnutrition: Secondary | ICD-10-CM | POA: Diagnosis not present

## 2022-01-11 DIAGNOSIS — R238 Other skin changes: Secondary | ICD-10-CM | POA: Diagnosis not present

## 2022-01-11 DIAGNOSIS — I5042 Chronic combined systolic (congestive) and diastolic (congestive) heart failure: Secondary | ICD-10-CM | POA: Diagnosis not present

## 2022-01-12 DIAGNOSIS — E785 Hyperlipidemia, unspecified: Secondary | ICD-10-CM | POA: Diagnosis not present

## 2022-01-12 DIAGNOSIS — I5042 Chronic combined systolic (congestive) and diastolic (congestive) heart failure: Secondary | ICD-10-CM | POA: Diagnosis not present

## 2022-01-12 DIAGNOSIS — I110 Hypertensive heart disease with heart failure: Secondary | ICD-10-CM | POA: Diagnosis not present

## 2022-01-12 DIAGNOSIS — R238 Other skin changes: Secondary | ICD-10-CM | POA: Diagnosis not present

## 2022-01-12 DIAGNOSIS — E43 Unspecified severe protein-calorie malnutrition: Secondary | ICD-10-CM | POA: Diagnosis not present

## 2022-01-12 DIAGNOSIS — E1169 Type 2 diabetes mellitus with other specified complication: Secondary | ICD-10-CM | POA: Diagnosis not present

## 2022-01-13 DIAGNOSIS — I5042 Chronic combined systolic (congestive) and diastolic (congestive) heart failure: Secondary | ICD-10-CM | POA: Diagnosis not present

## 2022-01-13 DIAGNOSIS — M6281 Muscle weakness (generalized): Secondary | ICD-10-CM | POA: Diagnosis not present

## 2022-01-13 DIAGNOSIS — E43 Unspecified severe protein-calorie malnutrition: Secondary | ICD-10-CM | POA: Diagnosis not present

## 2022-01-13 DIAGNOSIS — E785 Hyperlipidemia, unspecified: Secondary | ICD-10-CM | POA: Diagnosis not present

## 2022-01-13 DIAGNOSIS — I110 Hypertensive heart disease with heart failure: Secondary | ICD-10-CM | POA: Diagnosis not present

## 2022-01-13 DIAGNOSIS — E1169 Type 2 diabetes mellitus with other specified complication: Secondary | ICD-10-CM | POA: Diagnosis not present

## 2022-01-13 DIAGNOSIS — R238 Other skin changes: Secondary | ICD-10-CM | POA: Diagnosis not present

## 2022-01-13 DIAGNOSIS — I5022 Chronic systolic (congestive) heart failure: Secondary | ICD-10-CM | POA: Diagnosis not present

## 2022-01-17 DIAGNOSIS — E785 Hyperlipidemia, unspecified: Secondary | ICD-10-CM | POA: Diagnosis not present

## 2022-01-17 DIAGNOSIS — R238 Other skin changes: Secondary | ICD-10-CM | POA: Diagnosis not present

## 2022-01-17 DIAGNOSIS — E43 Unspecified severe protein-calorie malnutrition: Secondary | ICD-10-CM | POA: Diagnosis not present

## 2022-01-17 DIAGNOSIS — E1169 Type 2 diabetes mellitus with other specified complication: Secondary | ICD-10-CM | POA: Diagnosis not present

## 2022-01-17 DIAGNOSIS — I5042 Chronic combined systolic (congestive) and diastolic (congestive) heart failure: Secondary | ICD-10-CM | POA: Diagnosis not present

## 2022-01-17 DIAGNOSIS — I110 Hypertensive heart disease with heart failure: Secondary | ICD-10-CM | POA: Diagnosis not present

## 2022-01-19 ENCOUNTER — Other Ambulatory Visit (HOSPITAL_COMMUNITY): Payer: Self-pay

## 2022-01-19 DIAGNOSIS — I110 Hypertensive heart disease with heart failure: Secondary | ICD-10-CM | POA: Diagnosis not present

## 2022-01-19 DIAGNOSIS — D6859 Other primary thrombophilia: Secondary | ICD-10-CM | POA: Diagnosis not present

## 2022-01-19 DIAGNOSIS — E785 Hyperlipidemia, unspecified: Secondary | ICD-10-CM | POA: Diagnosis not present

## 2022-01-19 DIAGNOSIS — R7989 Other specified abnormal findings of blood chemistry: Secondary | ICD-10-CM | POA: Diagnosis not present

## 2022-01-19 DIAGNOSIS — E43 Unspecified severe protein-calorie malnutrition: Secondary | ICD-10-CM | POA: Diagnosis not present

## 2022-01-19 DIAGNOSIS — I5030 Unspecified diastolic (congestive) heart failure: Secondary | ICD-10-CM | POA: Diagnosis not present

## 2022-01-19 DIAGNOSIS — R238 Other skin changes: Secondary | ICD-10-CM | POA: Diagnosis not present

## 2022-01-19 DIAGNOSIS — I4891 Unspecified atrial fibrillation: Secondary | ICD-10-CM | POA: Diagnosis not present

## 2022-01-19 DIAGNOSIS — I5042 Chronic combined systolic (congestive) and diastolic (congestive) heart failure: Secondary | ICD-10-CM | POA: Diagnosis not present

## 2022-01-19 DIAGNOSIS — E1169 Type 2 diabetes mellitus with other specified complication: Secondary | ICD-10-CM | POA: Diagnosis not present

## 2022-01-20 ENCOUNTER — Other Ambulatory Visit (HOSPITAL_COMMUNITY): Payer: Self-pay

## 2022-01-20 DIAGNOSIS — I4891 Unspecified atrial fibrillation: Secondary | ICD-10-CM | POA: Diagnosis not present

## 2022-01-20 DIAGNOSIS — R54 Age-related physical debility: Secondary | ICD-10-CM | POA: Diagnosis not present

## 2022-01-20 DIAGNOSIS — I2510 Atherosclerotic heart disease of native coronary artery without angina pectoris: Secondary | ICD-10-CM | POA: Diagnosis not present

## 2022-01-20 MED ORDER — POTASSIUM CHLORIDE CRYS ER 20 MEQ PO TBCR
20.0000 meq | EXTENDED_RELEASE_TABLET | ORAL | 0 refills | Status: DC
  Filled 2022-01-20: qty 60, 59d supply, fill #0

## 2022-01-21 ENCOUNTER — Other Ambulatory Visit (HOSPITAL_COMMUNITY): Payer: Self-pay

## 2022-01-21 DIAGNOSIS — E785 Hyperlipidemia, unspecified: Secondary | ICD-10-CM | POA: Diagnosis not present

## 2022-01-21 DIAGNOSIS — E1169 Type 2 diabetes mellitus with other specified complication: Secondary | ICD-10-CM | POA: Diagnosis not present

## 2022-01-21 DIAGNOSIS — I110 Hypertensive heart disease with heart failure: Secondary | ICD-10-CM | POA: Diagnosis not present

## 2022-01-21 DIAGNOSIS — R238 Other skin changes: Secondary | ICD-10-CM | POA: Diagnosis not present

## 2022-01-21 DIAGNOSIS — E43 Unspecified severe protein-calorie malnutrition: Secondary | ICD-10-CM | POA: Diagnosis not present

## 2022-01-21 DIAGNOSIS — I5042 Chronic combined systolic (congestive) and diastolic (congestive) heart failure: Secondary | ICD-10-CM | POA: Diagnosis not present

## 2022-01-22 DIAGNOSIS — R000 Tachycardia, unspecified: Secondary | ICD-10-CM | POA: Diagnosis not present

## 2022-01-24 ENCOUNTER — Other Ambulatory Visit (HOSPITAL_COMMUNITY): Payer: Self-pay

## 2022-01-24 DIAGNOSIS — I4891 Unspecified atrial fibrillation: Secondary | ICD-10-CM | POA: Diagnosis not present

## 2022-01-24 DIAGNOSIS — I10 Essential (primary) hypertension: Secondary | ICD-10-CM | POA: Diagnosis not present

## 2022-01-26 DIAGNOSIS — I110 Hypertensive heart disease with heart failure: Secondary | ICD-10-CM | POA: Diagnosis not present

## 2022-01-26 DIAGNOSIS — I5042 Chronic combined systolic (congestive) and diastolic (congestive) heart failure: Secondary | ICD-10-CM | POA: Diagnosis not present

## 2022-01-26 DIAGNOSIS — E43 Unspecified severe protein-calorie malnutrition: Secondary | ICD-10-CM | POA: Diagnosis not present

## 2022-01-26 DIAGNOSIS — E785 Hyperlipidemia, unspecified: Secondary | ICD-10-CM | POA: Diagnosis not present

## 2022-01-26 DIAGNOSIS — E1169 Type 2 diabetes mellitus with other specified complication: Secondary | ICD-10-CM | POA: Diagnosis not present

## 2022-01-26 DIAGNOSIS — R238 Other skin changes: Secondary | ICD-10-CM | POA: Diagnosis not present

## 2022-01-27 ENCOUNTER — Other Ambulatory Visit (HOSPITAL_COMMUNITY): Payer: Self-pay

## 2022-01-27 DIAGNOSIS — R54 Age-related physical debility: Secondary | ICD-10-CM | POA: Diagnosis not present

## 2022-01-27 DIAGNOSIS — I5033 Acute on chronic diastolic (congestive) heart failure: Secondary | ICD-10-CM | POA: Diagnosis not present

## 2022-01-27 DIAGNOSIS — Z8673 Personal history of transient ischemic attack (TIA), and cerebral infarction without residual deficits: Secondary | ICD-10-CM | POA: Diagnosis not present

## 2022-01-27 DIAGNOSIS — I482 Chronic atrial fibrillation, unspecified: Secondary | ICD-10-CM | POA: Diagnosis not present

## 2022-01-28 ENCOUNTER — Other Ambulatory Visit (HOSPITAL_COMMUNITY): Payer: Self-pay

## 2022-01-28 DIAGNOSIS — E43 Unspecified severe protein-calorie malnutrition: Secondary | ICD-10-CM | POA: Diagnosis not present

## 2022-01-28 DIAGNOSIS — R238 Other skin changes: Secondary | ICD-10-CM | POA: Diagnosis not present

## 2022-01-28 DIAGNOSIS — E1169 Type 2 diabetes mellitus with other specified complication: Secondary | ICD-10-CM | POA: Diagnosis not present

## 2022-01-28 DIAGNOSIS — I5042 Chronic combined systolic (congestive) and diastolic (congestive) heart failure: Secondary | ICD-10-CM | POA: Diagnosis not present

## 2022-01-28 DIAGNOSIS — E785 Hyperlipidemia, unspecified: Secondary | ICD-10-CM | POA: Diagnosis not present

## 2022-01-28 DIAGNOSIS — I110 Hypertensive heart disease with heart failure: Secondary | ICD-10-CM | POA: Diagnosis not present

## 2022-01-30 ENCOUNTER — Other Ambulatory Visit (HOSPITAL_COMMUNITY): Payer: Self-pay

## 2022-01-31 ENCOUNTER — Other Ambulatory Visit (HOSPITAL_COMMUNITY): Payer: Self-pay

## 2022-02-01 ENCOUNTER — Other Ambulatory Visit (HOSPITAL_COMMUNITY): Payer: Self-pay

## 2022-02-02 ENCOUNTER — Other Ambulatory Visit (HOSPITAL_COMMUNITY): Payer: Self-pay

## 2022-02-02 DIAGNOSIS — E43 Unspecified severe protein-calorie malnutrition: Secondary | ICD-10-CM | POA: Diagnosis not present

## 2022-02-02 DIAGNOSIS — E1169 Type 2 diabetes mellitus with other specified complication: Secondary | ICD-10-CM | POA: Diagnosis not present

## 2022-02-02 DIAGNOSIS — R238 Other skin changes: Secondary | ICD-10-CM | POA: Diagnosis not present

## 2022-02-02 DIAGNOSIS — I5042 Chronic combined systolic (congestive) and diastolic (congestive) heart failure: Secondary | ICD-10-CM | POA: Diagnosis not present

## 2022-02-02 DIAGNOSIS — E785 Hyperlipidemia, unspecified: Secondary | ICD-10-CM | POA: Diagnosis not present

## 2022-02-02 DIAGNOSIS — I110 Hypertensive heart disease with heart failure: Secondary | ICD-10-CM | POA: Diagnosis not present

## 2022-02-03 ENCOUNTER — Other Ambulatory Visit (HOSPITAL_COMMUNITY): Payer: Self-pay

## 2022-02-03 DIAGNOSIS — E876 Hypokalemia: Secondary | ICD-10-CM | POA: Diagnosis not present

## 2022-02-04 ENCOUNTER — Other Ambulatory Visit (HOSPITAL_COMMUNITY): Payer: Self-pay

## 2022-02-04 MED ORDER — LEVOTHYROXINE SODIUM 25 MCG PO TABS
25.0000 ug | ORAL_TABLET | Freq: Every morning | ORAL | 0 refills | Status: DC
  Filled 2022-02-04: qty 30, 30d supply, fill #0

## 2022-02-06 DIAGNOSIS — R54 Age-related physical debility: Secondary | ICD-10-CM | POA: Diagnosis not present

## 2022-02-06 DIAGNOSIS — I5033 Acute on chronic diastolic (congestive) heart failure: Secondary | ICD-10-CM | POA: Diagnosis not present

## 2022-02-08 DIAGNOSIS — R238 Other skin changes: Secondary | ICD-10-CM | POA: Diagnosis not present

## 2022-02-08 DIAGNOSIS — E785 Hyperlipidemia, unspecified: Secondary | ICD-10-CM | POA: Diagnosis not present

## 2022-02-08 DIAGNOSIS — I5042 Chronic combined systolic (congestive) and diastolic (congestive) heart failure: Secondary | ICD-10-CM | POA: Diagnosis not present

## 2022-02-08 DIAGNOSIS — E1169 Type 2 diabetes mellitus with other specified complication: Secondary | ICD-10-CM | POA: Diagnosis not present

## 2022-02-08 DIAGNOSIS — I110 Hypertensive heart disease with heart failure: Secondary | ICD-10-CM | POA: Diagnosis not present

## 2022-02-08 DIAGNOSIS — E43 Unspecified severe protein-calorie malnutrition: Secondary | ICD-10-CM | POA: Diagnosis not present

## 2022-02-11 DIAGNOSIS — E1169 Type 2 diabetes mellitus with other specified complication: Secondary | ICD-10-CM | POA: Diagnosis not present

## 2022-02-11 DIAGNOSIS — I110 Hypertensive heart disease with heart failure: Secondary | ICD-10-CM | POA: Diagnosis not present

## 2022-02-11 DIAGNOSIS — E785 Hyperlipidemia, unspecified: Secondary | ICD-10-CM | POA: Diagnosis not present

## 2022-02-11 DIAGNOSIS — I5042 Chronic combined systolic (congestive) and diastolic (congestive) heart failure: Secondary | ICD-10-CM | POA: Diagnosis not present

## 2022-02-11 DIAGNOSIS — E43 Unspecified severe protein-calorie malnutrition: Secondary | ICD-10-CM | POA: Diagnosis not present

## 2022-02-11 DIAGNOSIS — R238 Other skin changes: Secondary | ICD-10-CM | POA: Diagnosis not present

## 2022-02-12 DIAGNOSIS — M6281 Muscle weakness (generalized): Secondary | ICD-10-CM | POA: Diagnosis not present

## 2022-02-12 DIAGNOSIS — I5022 Chronic systolic (congestive) heart failure: Secondary | ICD-10-CM | POA: Diagnosis not present

## 2022-02-17 ENCOUNTER — Other Ambulatory Visit (HOSPITAL_COMMUNITY): Payer: Self-pay

## 2022-02-21 ENCOUNTER — Other Ambulatory Visit (HOSPITAL_COMMUNITY): Payer: Self-pay

## 2022-02-22 ENCOUNTER — Other Ambulatory Visit (HOSPITAL_COMMUNITY): Payer: Self-pay

## 2022-02-23 ENCOUNTER — Other Ambulatory Visit (HOSPITAL_COMMUNITY): Payer: Self-pay

## 2022-02-23 MED ORDER — LEVOTHYROXINE SODIUM 25 MCG PO TABS
25.0000 ug | ORAL_TABLET | Freq: Every morning | ORAL | 0 refills | Status: DC
  Filled 2022-02-23 – 2022-03-23 (×2): qty 30, 30d supply, fill #0

## 2022-02-28 ENCOUNTER — Other Ambulatory Visit (HOSPITAL_COMMUNITY): Payer: Self-pay

## 2022-03-03 ENCOUNTER — Other Ambulatory Visit (HOSPITAL_COMMUNITY): Payer: Self-pay

## 2022-03-03 DIAGNOSIS — I5030 Unspecified diastolic (congestive) heart failure: Secondary | ICD-10-CM | POA: Diagnosis not present

## 2022-03-03 MED ORDER — FUROSEMIDE 20 MG PO TABS
20.0000 mg | ORAL_TABLET | Freq: Every day | ORAL | 0 refills | Status: DC | PRN
  Filled 2022-03-03: qty 30, 30d supply, fill #0

## 2022-03-09 DIAGNOSIS — R54 Age-related physical debility: Secondary | ICD-10-CM | POA: Diagnosis not present

## 2022-03-09 DIAGNOSIS — I5033 Acute on chronic diastolic (congestive) heart failure: Secondary | ICD-10-CM | POA: Diagnosis not present

## 2022-03-15 DIAGNOSIS — I5022 Chronic systolic (congestive) heart failure: Secondary | ICD-10-CM | POA: Diagnosis not present

## 2022-03-15 DIAGNOSIS — M6281 Muscle weakness (generalized): Secondary | ICD-10-CM | POA: Diagnosis not present

## 2022-03-17 DIAGNOSIS — I4891 Unspecified atrial fibrillation: Secondary | ICD-10-CM | POA: Diagnosis not present

## 2022-03-17 DIAGNOSIS — R54 Age-related physical debility: Secondary | ICD-10-CM | POA: Diagnosis not present

## 2022-03-17 DIAGNOSIS — I2510 Atherosclerotic heart disease of native coronary artery without angina pectoris: Secondary | ICD-10-CM | POA: Diagnosis not present

## 2022-03-17 DIAGNOSIS — E038 Other specified hypothyroidism: Secondary | ICD-10-CM | POA: Diagnosis not present

## 2022-03-23 ENCOUNTER — Other Ambulatory Visit (HOSPITAL_COMMUNITY): Payer: Self-pay

## 2022-03-24 ENCOUNTER — Other Ambulatory Visit (HOSPITAL_COMMUNITY): Payer: Self-pay

## 2022-03-25 ENCOUNTER — Other Ambulatory Visit (HOSPITAL_COMMUNITY): Payer: Self-pay

## 2022-03-26 ENCOUNTER — Encounter (HOSPITAL_COMMUNITY): Payer: Self-pay

## 2022-03-26 ENCOUNTER — Emergency Department (HOSPITAL_COMMUNITY): Payer: Medicare Other

## 2022-03-26 ENCOUNTER — Other Ambulatory Visit: Payer: Self-pay

## 2022-03-26 ENCOUNTER — Inpatient Hospital Stay (HOSPITAL_COMMUNITY)
Admission: EM | Admit: 2022-03-26 | Discharge: 2022-04-05 | DRG: 291 | Disposition: A | Discharge status: Home Health | Payer: Medicare Other | Attending: Internal Medicine | Admitting: Internal Medicine

## 2022-03-26 DIAGNOSIS — E669 Obesity, unspecified: Secondary | ICD-10-CM | POA: Diagnosis not present

## 2022-03-26 DIAGNOSIS — R000 Tachycardia, unspecified: Secondary | ICD-10-CM | POA: Diagnosis present

## 2022-03-26 DIAGNOSIS — I4819 Other persistent atrial fibrillation: Secondary | ICD-10-CM | POA: Diagnosis present

## 2022-03-26 DIAGNOSIS — L97519 Non-pressure chronic ulcer of other part of right foot with unspecified severity: Secondary | ICD-10-CM | POA: Diagnosis not present

## 2022-03-26 DIAGNOSIS — I2729 Other secondary pulmonary hypertension: Secondary | ICD-10-CM | POA: Diagnosis present

## 2022-03-26 DIAGNOSIS — K219 Gastro-esophageal reflux disease without esophagitis: Secondary | ICD-10-CM | POA: Diagnosis present

## 2022-03-26 DIAGNOSIS — N179 Acute kidney failure, unspecified: Secondary | ICD-10-CM | POA: Diagnosis not present

## 2022-03-26 DIAGNOSIS — I110 Hypertensive heart disease with heart failure: Secondary | ICD-10-CM | POA: Diagnosis not present

## 2022-03-26 DIAGNOSIS — I5082 Biventricular heart failure: Secondary | ICD-10-CM | POA: Diagnosis present

## 2022-03-26 DIAGNOSIS — Z66 Do not resuscitate: Secondary | ICD-10-CM | POA: Diagnosis present

## 2022-03-26 DIAGNOSIS — I252 Old myocardial infarction: Secondary | ICD-10-CM | POA: Diagnosis not present

## 2022-03-26 DIAGNOSIS — I10 Essential (primary) hypertension: Secondary | ICD-10-CM | POA: Diagnosis not present

## 2022-03-26 DIAGNOSIS — Z1330 Encounter for screening examination for mental health and behavioral disorders, unspecified: Secondary | ICD-10-CM | POA: Diagnosis not present

## 2022-03-26 DIAGNOSIS — I2782 Chronic pulmonary embolism: Secondary | ICD-10-CM | POA: Diagnosis not present

## 2022-03-26 DIAGNOSIS — I2510 Atherosclerotic heart disease of native coronary artery without angina pectoris: Secondary | ICD-10-CM | POA: Diagnosis not present

## 2022-03-26 DIAGNOSIS — Z823 Family history of stroke: Secondary | ICD-10-CM

## 2022-03-26 DIAGNOSIS — E876 Hypokalemia: Secondary | ICD-10-CM | POA: Diagnosis not present

## 2022-03-26 DIAGNOSIS — R001 Bradycardia, unspecified: Secondary | ICD-10-CM | POA: Diagnosis present

## 2022-03-26 DIAGNOSIS — J90 Pleural effusion, not elsewhere classified: Secondary | ICD-10-CM | POA: Diagnosis not present

## 2022-03-26 DIAGNOSIS — I5023 Acute on chronic systolic (congestive) heart failure: Secondary | ICD-10-CM | POA: Diagnosis present

## 2022-03-26 DIAGNOSIS — L8990 Pressure ulcer of unspecified site, unspecified stage: Secondary | ICD-10-CM | POA: Diagnosis present

## 2022-03-26 DIAGNOSIS — E119 Type 2 diabetes mellitus without complications: Secondary | ICD-10-CM

## 2022-03-26 DIAGNOSIS — Z7901 Long term (current) use of anticoagulants: Secondary | ICD-10-CM

## 2022-03-26 DIAGNOSIS — I484 Atypical atrial flutter: Secondary | ICD-10-CM | POA: Diagnosis present

## 2022-03-26 DIAGNOSIS — Z888 Allergy status to other drugs, medicaments and biological substances status: Secondary | ICD-10-CM

## 2022-03-26 DIAGNOSIS — I509 Heart failure, unspecified: Principal | ICD-10-CM

## 2022-03-26 DIAGNOSIS — I2699 Other pulmonary embolism without acute cor pulmonale: Secondary | ICD-10-CM | POA: Diagnosis present

## 2022-03-26 DIAGNOSIS — Z6830 Body mass index (BMI) 30.0-30.9, adult: Secondary | ICD-10-CM

## 2022-03-26 DIAGNOSIS — I2781 Cor pulmonale (chronic): Secondary | ICD-10-CM | POA: Diagnosis not present

## 2022-03-26 DIAGNOSIS — I4891 Unspecified atrial fibrillation: Secondary | ICD-10-CM | POA: Diagnosis not present

## 2022-03-26 DIAGNOSIS — I5021 Acute systolic (congestive) heart failure: Secondary | ICD-10-CM | POA: Diagnosis not present

## 2022-03-26 DIAGNOSIS — R627 Adult failure to thrive: Secondary | ICD-10-CM | POA: Diagnosis not present

## 2022-03-26 DIAGNOSIS — Z743 Need for continuous supervision: Secondary | ICD-10-CM | POA: Diagnosis not present

## 2022-03-26 DIAGNOSIS — R531 Weakness: Secondary | ICD-10-CM | POA: Diagnosis not present

## 2022-03-26 DIAGNOSIS — R609 Edema, unspecified: Secondary | ICD-10-CM | POA: Diagnosis not present

## 2022-03-26 DIAGNOSIS — E039 Hypothyroidism, unspecified: Secondary | ICD-10-CM | POA: Diagnosis present

## 2022-03-26 DIAGNOSIS — Z86718 Personal history of other venous thrombosis and embolism: Secondary | ICD-10-CM

## 2022-03-26 DIAGNOSIS — Z7401 Bed confinement status: Secondary | ICD-10-CM

## 2022-03-26 DIAGNOSIS — R5381 Other malaise: Secondary | ICD-10-CM | POA: Diagnosis present

## 2022-03-26 DIAGNOSIS — Z86711 Personal history of pulmonary embolism: Secondary | ICD-10-CM | POA: Diagnosis not present

## 2022-03-26 DIAGNOSIS — Z79890 Hormone replacement therapy (postmenopausal): Secondary | ICD-10-CM

## 2022-03-26 DIAGNOSIS — R52 Pain, unspecified: Secondary | ICD-10-CM | POA: Diagnosis not present

## 2022-03-26 DIAGNOSIS — I2609 Other pulmonary embolism with acute cor pulmonale: Secondary | ICD-10-CM | POA: Diagnosis not present

## 2022-03-26 DIAGNOSIS — I480 Paroxysmal atrial fibrillation: Secondary | ICD-10-CM | POA: Diagnosis not present

## 2022-03-26 DIAGNOSIS — Z8249 Family history of ischemic heart disease and other diseases of the circulatory system: Secondary | ICD-10-CM

## 2022-03-26 DIAGNOSIS — Z79899 Other long term (current) drug therapy: Secondary | ICD-10-CM

## 2022-03-26 DIAGNOSIS — L89152 Pressure ulcer of sacral region, stage 2: Secondary | ICD-10-CM | POA: Diagnosis not present

## 2022-03-26 DIAGNOSIS — Z8673 Personal history of transient ischemic attack (TIA), and cerebral infarction without residual deficits: Secondary | ICD-10-CM | POA: Diagnosis not present

## 2022-03-26 DIAGNOSIS — G459 Transient cerebral ischemic attack, unspecified: Secondary | ICD-10-CM | POA: Diagnosis not present

## 2022-03-26 DIAGNOSIS — R600 Localized edema: Secondary | ICD-10-CM | POA: Diagnosis not present

## 2022-03-26 DIAGNOSIS — M7989 Other specified soft tissue disorders: Secondary | ICD-10-CM | POA: Diagnosis not present

## 2022-03-26 LAB — BASIC METABOLIC PANEL
Anion gap: 13 (ref 5–15)
BUN: 10 mg/dL (ref 8–23)
CO2: 27 mmol/L (ref 22–32)
Calcium: 8.8 mg/dL — ABNORMAL LOW (ref 8.9–10.3)
Chloride: 100 mmol/L (ref 98–111)
Creatinine, Ser: 1.13 mg/dL — ABNORMAL HIGH (ref 0.44–1.00)
GFR, Estimated: 51 mL/min — ABNORMAL LOW (ref >60)
Glucose, Bld: 107 mg/dL — ABNORMAL HIGH (ref 70–99)
Potassium: 2.7 mmol/L — CL (ref 3.5–5.1)
Sodium: 140 mmol/L (ref 135–145)

## 2022-03-26 LAB — CBC
HCT: 38 % (ref 36.0–46.0)
Hemoglobin: 12.3 g/dL (ref 12.0–15.0)
MCH: 24.6 pg — ABNORMAL LOW (ref 26.0–34.0)
MCHC: 32.4 g/dL (ref 30.0–36.0)
MCV: 76.2 fL — ABNORMAL LOW (ref 80.0–100.0)
Platelets: 197 10*3/uL (ref 150–400)
RBC: 4.99 MIL/uL (ref 3.87–5.11)
RDW: 20.1 % — ABNORMAL HIGH (ref 11.5–15.5)
WBC: 7.3 10*3/uL (ref 4.0–10.5)
nRBC: 0 % (ref 0.0–0.2)

## 2022-03-26 LAB — GLUCOSE, CAPILLARY
Glucose-Capillary: 90 mg/dL (ref 70–99)
Glucose-Capillary: 138 mg/dL — ABNORMAL HIGH (ref 70–99)

## 2022-03-26 LAB — TROPONIN I (HIGH SENSITIVITY)
Troponin I (High Sensitivity): 19 ng/L — ABNORMAL HIGH (ref <18)
Troponin I (High Sensitivity): 18 ng/L — ABNORMAL HIGH (ref <18)

## 2022-03-26 LAB — HEPATIC FUNCTION PANEL
ALT: 11 U/L (ref 0–44)
AST: 17 U/L (ref 15–41)
Albumin: 2.1 g/dL — ABNORMAL LOW (ref 3.5–5.0)
Alkaline Phosphatase: 82 U/L (ref 38–126)
Bilirubin, Direct: 0.5 mg/dL — ABNORMAL HIGH (ref 0.0–0.2)
Indirect Bilirubin: 1.1 mg/dL — ABNORMAL HIGH (ref 0.3–0.9)
Total Bilirubin: 1.6 mg/dL — ABNORMAL HIGH (ref 0.3–1.2)
Total Protein: 6 g/dL — ABNORMAL LOW (ref 6.5–8.1)

## 2022-03-26 LAB — HEMOGLOBIN A1C
Hgb A1c MFr Bld: 5.1 % (ref 4.8–5.6)
Mean Plasma Glucose: 99.67 mg/dL

## 2022-03-26 LAB — TSH: TSH: 5.683 u[IU]/mL — ABNORMAL HIGH (ref 0.350–4.500)

## 2022-03-26 LAB — MAGNESIUM: Magnesium: 1.5 mg/dL — ABNORMAL LOW (ref 1.7–2.4)

## 2022-03-26 LAB — BRAIN NATRIURETIC PEPTIDE: B Natriuretic Peptide: 1904.9 pg/mL — ABNORMAL HIGH (ref 0.0–100.0)

## 2022-03-26 MED ORDER — SODIUM CHLORIDE 0.9% FLUSH: 3.0000 mL | INTRAVENOUS | Status: DC | PRN

## 2022-03-26 MED ORDER — POTASSIUM CHLORIDE 10 MEQ/100ML IV SOLN
10.0000 meq | INTRAVENOUS | Status: AC
  Administered 2022-03-26 (×3): 10 meq via INTRAVENOUS
  Filled 2022-03-26 (×3): qty 100

## 2022-03-26 MED ORDER — LEVOTHYROXINE SODIUM 25 MCG PO TABS
25.0000 ug | ORAL_TABLET | Freq: Every day | ORAL | Status: DC
  Administered 2022-03-27 – 2022-04-05 (×10): 25 ug via ORAL
  Filled 2022-03-26 (×10): qty 1

## 2022-03-26 MED ORDER — FAMOTIDINE 20 MG PO TABS
20.0000 mg | ORAL_TABLET | Freq: Every evening | ORAL | Status: DC | PRN
  Administered 2022-03-31 – 2022-04-05 (×2): 20 mg via ORAL
  Filled 2022-03-26 (×3): qty 1

## 2022-03-26 MED ORDER — POTASSIUM CHLORIDE CRYS ER 20 MEQ PO TBCR
40.0000 meq | EXTENDED_RELEASE_TABLET | Freq: Once | ORAL | Status: AC
  Administered 2022-03-26: 40 meq via ORAL
  Filled 2022-03-26: qty 2

## 2022-03-26 MED ORDER — ACETAMINOPHEN 325 MG PO TABS
650.0000 mg | ORAL_TABLET | Freq: Four times a day (QID) | ORAL | Status: DC | PRN
  Administered 2022-04-04: 650 mg via ORAL
  Filled 2022-03-26: qty 2

## 2022-03-26 MED ORDER — APIXABAN 5 MG PO TABS
5.0000 mg | ORAL_TABLET | Freq: Two times a day (BID) | ORAL | Status: DC
  Administered 2022-03-26 – 2022-04-05 (×20): 5 mg via ORAL
  Filled 2022-03-26 (×20): qty 1

## 2022-03-26 MED ORDER — ACETAMINOPHEN 650 MG RE SUPP: 650.0000 mg | Freq: Four times a day (QID) | RECTAL | Status: DC | PRN

## 2022-03-26 MED ORDER — METOPROLOL SUCCINATE ER 25 MG PO TB24
25.0000 mg | ORAL_TABLET | Freq: Every day | ORAL | Status: DC
  Administered 2022-03-26 – 2022-03-27 (×2): 25 mg via ORAL
  Filled 2022-03-26 (×3): qty 1

## 2022-03-26 MED ORDER — MIRTAZAPINE 15 MG PO TABS: 15.0000 mg | ORAL_TABLET | Freq: Every day | ORAL | Status: DC

## 2022-03-26 MED ORDER — ENSURE ENLIVE PO LIQD
237.0000 mL | Freq: Two times a day (BID) | ORAL | Status: DC
  Administered 2022-03-27 – 2022-04-05 (×18): 237 mL via ORAL

## 2022-03-26 MED ORDER — FUROSEMIDE 10 MG/ML IJ SOLN
40.0000 mg | Freq: Two times a day (BID) | INTRAMUSCULAR | Status: DC
  Administered 2022-03-26 – 2022-03-29 (×6): 40 mg via INTRAVENOUS
  Filled 2022-03-26 (×6): qty 4

## 2022-03-26 MED ORDER — AMIODARONE HCL 100 MG PO TABS
100.0000 mg | ORAL_TABLET | Freq: Every day | ORAL | Status: DC
  Administered 2022-03-26 – 2022-03-29 (×4): 100 mg via ORAL
  Filled 2022-03-26 (×4): qty 1

## 2022-03-26 MED ORDER — SODIUM CHLORIDE 0.9 % IV SOLN: 250.0000 mL | INTRAVENOUS | Status: DC | PRN

## 2022-03-26 MED ORDER — SODIUM CHLORIDE 0.9% FLUSH
3.0000 mL | Freq: Two times a day (BID) | INTRAVENOUS | Status: DC
Start: 2022-03-26 — End: 2022-04-05
  Administered 2022-03-26 – 2022-04-04 (×17): 3 mL via INTRAVENOUS

## 2022-03-26 MED ORDER — FUROSEMIDE 10 MG/ML IJ SOLN
40.0000 mg | Freq: Once | INTRAMUSCULAR | Status: AC
  Administered 2022-03-26: 40 mg via INTRAVENOUS
  Filled 2022-03-26: qty 4

## 2022-03-26 MED ORDER — INSULIN ASPART 100 UNIT/ML IJ SOLN
0.0000 [IU] | Freq: Three times a day (TID) | INTRAMUSCULAR | Status: DC
  Administered 2022-03-28: 1 [IU] via SUBCUTANEOUS
  Administered 2022-03-30 – 2022-03-31 (×2): 2 [IU] via SUBCUTANEOUS
  Administered 2022-04-04: 1 [IU] via SUBCUTANEOUS

## 2022-03-26 MED ORDER — ROSUVASTATIN CALCIUM 5 MG PO TABS
5.0000 mg | ORAL_TABLET | Freq: Every day | ORAL | Status: DC
Start: 2022-03-26 — End: 2022-04-05
  Administered 2022-03-26 – 2022-04-05 (×11): 5 mg via ORAL
  Filled 2022-03-26 (×11): qty 1

## 2022-03-26 NOTE — ED Provider Notes (Addendum)
[facility] EMERGENCY DEPARTMENT Provider Note   CSN: [id number] Arrival date & time: 03/26/22  1011     History  No chief complaint on file.   Ashley [name] is a 76 y.o. female.  HPI 76 year old female with a history of CHF, EF 45 to 50% as of 2022, PE, atrial fibrillation on Eliquis, NSTEMI, DVT, hypertension, DM type II presents to the ER with her caregiver at bedside with complaints of failure to thrive.  Over the last several months the patient has been steadily declining.  She has had poor p.o. intake, at times can be confused.  She has a developing sacral wound ulcer per her caretaker.  She was hospitalized with a similar complaint back in November 2022.  She was found to have a high digoxin level and was admitted for digoxin toxicity, hypokalemia and AKI.  She was also noted to have an NSTEMI with a troponin at 800.  She underwent a left heart cath on 11/10 which showed right dominant circulation, tortuous vessels with no CAD, no antiplatelet therapy recommended.   Her caretaker is concerned as she feels like the patient is slowly dying and is not consuming enough food.  She and her son both think she desperately needs facility placement however the patient consistently refuses this.   The patient denies any chest pain, shortness of breath, dysuria, hematuria, abdominal pain, nausea, vomiting.  Home Medications Prior to Admission medications   Medication Sig Start Date End Date Taking? Authorizing Provider  acetaminophen (TYLENOL) 500 MG tablet Take 500 mg by mouth daily as needed for mild pain or headache.    [provider]  amiodarone (PACERONE) 100 MG tablet Take 1 tablet (100 mg total) by mouth daily. 08/01/21 08/31/21  Darliss Cheney, MD  amiodarone (PACERONE) 200 MG tablet Take 0.5 tablets (100 mg total) by mouth daily. 12/21/21     apixaban (ELIQUIS) 5 MG TABS tablet Take 1 tablet (5 mg total) by mouth 2 (two) times daily. 06/30/21   Thurnell Lose, MD  apixaban (ELIQUIS) 5 MG TABS tablet Take 1 tablet (5 mg total) by mouth 2 (two) times daily. 11/23/21     famotidine (PEPCID) 20 MG tablet Take 1 tablet (20 mg total) by mouth daily. 06/27/21 06/27/22  Thurnell Lose, MD  famotidine (PEPCID) 20 MG tablet Take 1 tab by mouth daily 10/14/21     famotidine (PEPCID) 20 MG tablet Take 1 tablet (20 mg total) by mouth at bedtime as needed. 11/23/21     furosemide (LASIX) 20 MG tablet Take 1 tablet (20 mg total) by mouth daily as needed for excessive swelling. 03/03/22     levothyroxine (SYNTHROID) 25 MCG tablet Take 1 tablet (25 mcg total) by mouth in the morning on an empty stomach 02/23/22     losartan (COZAAR) 25 MG tablet Take 0.5 tablets (12.5 mg total) by mouth daily. 06/27/21 08/26/21  Thurnell Lose, MD  losartan (COZAAR) 25 MG tablet Take 0.5 tablets (12.5 mg total) by mouth daily. 11/23/21     metoprolol succinate (TOPROL-XL) 25 MG 24 hr tablet Take 1 tablet (25 mg total) by mouth daily. 11/23/21     metoprolol tartrate (LOPRESSOR) 25 MG tablet Take 1 tablet (25 mg total) by mouth 2 (two) times daily. 08/01/21 08/31/21  Darliss Cheney, MD  metoprolol tartrate (LOPRESSOR) 25 MG tablet Take 1 tablet (25 mg total) by mouth 2 (two) times daily. 10/14/21     mirtazapine (REMERON) 15 MG tablet  Take 1 tablet by mouth daily 10/14/21     mirtazapine (REMERON) 15 MG tablet Take 1 tablet (15 mg total) by mouth at bedtime. 11/23/21     omeprazole (PRILOSEC) 20 MG capsule Take 20 mg by mouth daily.    [provider]  omeprazole (PRILOSEC) 20 MG capsule Take 1 capsule (20 mg total) by mouth daily. 10/14/21     potassium chloride (KLOR-CON) 10 MEQ tablet Take 1 tablet (10 mEq total) by mouth daily with food 12/22/21     potassium chloride SA (KLOR-CON M) 20 MEQ tablet Take 2 tablets by mouth today Thursday 5.4.23 and then take 1 tablet daily starting Friday 5.5.23 01/20/22     rosuvastatin (CRESTOR) 5 MG tablet Take 1 tablet (5 mg total) by mouth daily.  07/02/21   Cantwell, Celeste C, PA-C  rosuvastatin (CRESTOR) 5 MG tablet Take 1 tablet (5 mg total) by mouth daily. 10/14/21     spironolactone (ALDACTONE) 25 MG tablet Take 0.5 tablets (12.5 mg total) by mouth daily. 08/01/21   Darliss Cheney, MD  spironolactone (ALDACTONE) 25 MG tablet Take 1 tablet (25 mg total) by mouth daily. 10/25/21     spironolactone (ALDACTONE) 25 MG tablet Take 1 tablet (25 mg total) by mouth daily. 11/23/21     spironolactone (ALDACTONE) 25 MG tablet Take 1 tablet (25 mg total) by mouth daily. 12/10/21         Allergies    Bystolic [nebivolol hcl], Coreg [carvedilol], and Minoxidil    Review of Systems   Review of Systems Ten systems reviewed and are negative for acute change, except as noted in the HPI.   Physical Exam Updated Vital Signs BP (!) 130/101 (BP Location: Right Arm)   Pulse 99   Temp (!) 96 F (35.6 C)   Resp 19   SpO2 98%  Physical Exam Vitals and nursing note reviewed.  Constitutional:      General: She is not in acute distress.    Appearance: She is well-developed.  HENT:     Head: Normocephalic and atraumatic.     Comments: Mild tenderness over the sinuses.  No mastoid tenderness, no ear pain, no ear deviation. Eyes:     Conjunctiva/sclera: Conjunctivae normal.  Cardiovascular:     Rate and Rhythm: Normal rate and regular rhythm.     Heart sounds: No murmur heard. Pulmonary:     Effort: Pulmonary effort is normal. No respiratory distress.     Breath sounds: Normal breath sounds.     Comments: Lung sounds with scattered crackles Abdominal:     Palpations: Abdomen is soft.     Tenderness: There is no abdominal tenderness.  Musculoskeletal:        General: Swelling present. No tenderness.     Cervical back: Neck supple.     Right lower leg: Edema present.     Left lower leg: Edema present.     Comments: Stage II sacral ulcer noted.  Evidence of skin tear and foot discoloration bilaterally, right more than left.  3+ pitting edema to  lower extremities bilaterally  Skin:    General: Skin is warm and dry.     Capillary Refill: Capillary refill takes less than 2 seconds.  Neurological:     Mental Status: She is alert.     Comments: Mental Status:  Alert, thought content appropriate, able to give a coherent history. Speech fluent without evidence of aphasia. Able to follow 2 step commands without difficulty.  Cranial Nerves:  II:  Peripheral visual fields grossly normal, pupils equal, round, reactive to light III,IV, VI: ptosis not present, extra-ocular motions intact bilaterally  V,VII: smile symmetric, facial light touch sensation equal VIII: hearing grossly normal to voice  X: uvula elevates symmetrically  XI: bilateral shoulder shrug symmetric and strong XII: midline tongue extension without fassiculations Motor:  Normal tone. 5/5 strength of BUE and BLE major muscle groups including strong and equal grip strength and dorsiflexion/plantar flexion Sensory: light touch normal in all extremities. Cerebellar: normal finger-to-nose with bilateral upper extremities, Romberg sign absent     Psychiatric:        Mood and Affect: Mood normal.     ED Results / Procedures / Treatments   Labs (all labs ordered are listed, but only abnormal results are displayed) Labs Reviewed  BASIC METABOLIC PANEL - Abnormal; Notable for the following components:      Result Value   Potassium 2.7 (*)    Glucose, Bld 107 (*)    Creatinine, Ser 1.13 (*)    Calcium 8.8 (*)    GFR, Estimated 51 (*)    All other components within normal limits  CBC - Abnormal; Notable for the following components:   MCV 76.2 (*)    MCH 24.6 (*)    RDW 20.1 (*)    All other components within normal limits  BRAIN NATRIURETIC PEPTIDE - Abnormal; Notable for the following components:   B Natriuretic Peptide 1,904.9 (*)    All other components within normal limits  HEPATIC FUNCTION PANEL - Abnormal; Notable for the following components:   Total Protein  6.0 (*)    Albumin 2.1 (*)    Total Bilirubin 1.6 (*)    Bilirubin, Direct 0.5 (*)    Indirect Bilirubin 1.1 (*)    All other components within normal limits  URINALYSIS, ROUTINE W REFLEX MICROSCOPIC  TROPONIN I (HIGH SENSITIVITY)    EKG EKG Interpretation  Date/Time:  Saturday March 26 2022 10:22:33 EDT Ventricular Rate:  110 PR Interval:    QRS Duration: 130 QT Interval:  370 QTC Calculation: 500 R Axis:   82 Text Interpretation: Atrial flutter with variable A-V block Non-specific intra-ventricular conduction block T wave abnormality, consider lateral ischemia Abnormal ECG When compared with ECG of 30-Jul-2021 09:29, PREVIOUS ECG IS PRESENT Confirmed by Edwin Dada ([id number]) on 03/26/2022 2:42:35 PM  Radiology DG Foot Complete Left  Result Date: 03/26/2022 CLINICAL DATA:  Swelling. EXAM: LEFT FOOT - COMPLETE 3+ VIEW COMPARISON:  None Available. FINDINGS: There is no evidence of fracture or dislocation. No radiographic evidence of osteomyelitis. Marked soft tissue swelling about the left foot. IMPRESSION: 1. No radiographic evidence of osteomyelitis. 2. Marked soft tissue swelling about the left foot. Electronically Signed   By: Ted Mcalpine M.D.   On: 03/26/2022 15:41   DG Foot Complete Right  Result Date: 03/26/2022 CLINICAL DATA:  Foot ulceration EXAM: RIGHT FOOT COMPLETE - 3+ VIEW COMPARISON:  None Available. FINDINGS: There is no evidence of fracture or dislocation. There is no evidence of arthropathy or other focal bone abnormality. Diffuse soft tissue edema about the foot and ankle. IMPRESSION: No fracture or dislocation of the right foot. Diffuse soft tissue edema about the foot and ankle. Electronically Signed   By: Jearld Lesch M.D.   On: 03/26/2022 15:40   DG Chest Portable 1 View  Result Date: 03/26/2022 CLINICAL DATA:  Leg swelling. EXAM: PORTABLE CHEST 1 VIEW COMPARISON:  07/28/21. FINDINGS: Cardiac enlargement, unchanged. There is a moderate right pleural effusion  with  decreased aeration to the right midlung and right base. Left lung appears clear. No definite left pleural effusion. Visualized osseous structures are unremarkable. IMPRESSION: 1. Cardiac enlargement. 2. Moderate right pleural effusion with decreased aeration to the right midlung and right base. Electronically Signed   By: Signa Kell M.D.   On: 03/26/2022 14:13   CT Head Wo Contrast  Result Date: 03/26/2022 CLINICAL DATA:  Altered mental status EXAM: CT HEAD WITHOUT CONTRAST TECHNIQUE: Contiguous axial images were obtained from the base of the skull through the vertex without intravenous contrast. RADIATION DOSE REDUCTION: This exam was performed according to the departmental dose-optimization program which includes automated exposure control, adjustment of the mA and/or kV according to patient size and/or use of iterative reconstruction technique. COMPARISON:  CT head 04/04/2020 FINDINGS: Brain: No acute intracranial hemorrhage, mass effect, or herniation. No extra-axial fluid collections. No evidence of acute territorial infarct. No hydrocephalus. Mild cortical volume loss. Patchy hypodensities throughout the periventricular and subcortical white matter, likely secondary to chronic microvascular ischemic changes. Patchy areas of encephalomalacia in the right temporal and parietal lobe secondary to old infarct. Vascular: No hyperdense vessel or unexpected calcification. Skull: Normal. Negative for fracture or focal lesion. Sinuses/Orbits: Small fluid levels in the right sphenoid sinus and left maxillary sinus. Other: Near-complete opacification of the right mastoid air cells and mild opacification of the left mastoid air cells, new since previous study. IMPRESSION: 1. No acute intracranial process identified. Chronic changes as described. 2. Correlate for possible acute left maxillary and right sphenoid sinusitis. 3. Bilateral mastoid air cell opacification right-greater-than-left, new since previous study.  Correlate clinically. Electronically Signed   By: Jannifer Hick M.D.   On: 03/26/2022 13:43    Procedures Procedures    Medications Ordered in ED Medications  potassium chloride 10 mEq in 100 mL IVPB (10 mEq Intravenous New Bag/Given 03/26/22 1541)  potassium chloride SA (KLOR-CON M) CR tablet 40 mEq (40 mEq Oral Given 03/26/22 1427)  furosemide (LASIX) injection 40 mg (40 mg Intravenous Given 03/26/22 1510)    ED Course/ Medical Decision Making/ A&P                            Medical Decision Making Amount and/or Complexity of Data Reviewed Labs: ordered. Radiology: ordered.  Risk Prescription drug management. Decision regarding hospitalization.  76 year old female presenting to the ER with failure to thrive.  On arrival, she is normotensive, afebrile, heart rate in the upper 90s low 100s, consistent with A-fib, rate controlled.  She is alert and oriented and able to answer questions appropriately.  Her physical exam shows a stage II sacral ulcer, she does have a skin tear and brownish discoloration to her feet bilaterally, right more than left.  She has 3+ pitting edema bilaterally and scattered crackles throughout, right more than left.  She is in no respiratory distress and is on room air.  Her abdomen is soft and nontender, negative Murphy's sign   Differential diagnosis for failure to thrive is broad and includes UTI, electrode abnormalities, stroke, infection, heart failure exacerbation, less likely PEs, DVT  Lab work ordered, reviewed and interpreted by me.  CBC with no leukocytosis, normal hemoglobin.  Hepatic function panel with normal LFTs, total bilirubin 1.6, has been elevated in the past.  Her BMP is notable for a potassium of 2.7, and a mild AKI of 1.13.  GFR of 51.   EKG reading atrial flutter, appears to be in  A-fib on the monitor  UA pending.  Chest x-ray ordered by myself, reviewed by me, agree with radiology read IMPRESSION:  1. Cardiac enlargement.  2.  Moderate right pleural effusion with decreased aeration to the  right midlung and right base.   CT of the head ordered, reviewed and interpreted by me, agree with radiology read IMPRESSION:  1. No acute intracranial process identified. Chronic changes as  described.  2. Correlate for possible acute left maxillary and right sphenoid  sinusitis.  3. Bilateral mastoid air cell opacification right-greater-than-left,  new since previous study. Correlate clinically.   Imaging of the bilateral feet obtained, reviewed by me, agree with radiology read  Patient was given potassium replacement and a dose of Lasix here in the ED.  Consulted hospitalist team for admission, spoke with Dr. Gentry Roch will admit the patient for further evaluation and treatment. Final Clinical Impression(s) / ED Diagnoses Final diagnoses:  Acute on chronic congestive heart failure, unspecified heart failure type ([place])  Hypokalemia  Failure to thrive in adult    Rx / DC Orders ED Discharge Orders     None            Garald Balding, PA-C [id number]    Lianne Cure, DO [id number]

## 2022-03-26 NOTE — TOC Initial Note (Signed)
Transition of Care [facility]) - Initial/Assessment Note    Patient Details  Name: Ashley [name] MRN: [id number] Date of Birth: 10-Jan-1946  [facility] CM/SW Contact:    Lockie Pares, RN Phone Number: 03/26/2022, 4:42 PM  Clinical Narrative:                 Patient lives with son, she presented with increase in leg swelling. Her potassium level is low BNP elevated. She was at [facility] for rehab . Discharged 10/15/2021. Medicaid is pending at this time.  No HH agency listed in [place].However has a caretaker and son.  She has been refusing to go to SNF again.   She is "scared to walk" and has become progressively bedridden and is developing sacral ulcer  PT and OT consults placed for recommendation. Recommend Palliative care to consult discuss GOC with patient and family.   CM will follow for needs, recommendations, and transitions of care Barriers to Discharge: Continued Medical Work up   Patient Goals and CMS Choice        Expected Discharge Plan and Services   In-house Referral: Clinical Social Work Discharge Planning Services: CM Consult   Living arrangements for the past 2 months: Single Family Home                                      Prior Living Arrangements/Services Living arrangements for the past 2 months: Single Family Home Lives with:: Adult Children Patient language and need for interpreter reviewed:: Yes        Need for Family Participation in Patient Care: Yes (Comment) Care giver support system in place?: Yes (comment)   Criminal Activity/Legal Involvement Pertinent to Current Situation/Hospitalization: No - Comment as needed  Activities of Daily Living      Permission Sought/Granted                  Emotional Assessment       Orientation: : Fluctuating Orientation (Suspected and/or reported Sundowners) Alcohol / Substance Use: Not Applicable Psych Involvement: No (comment)  Admission diagnosis:  Acute on  chronic systolic (congestive) heart failure (HCC) [I50.23] Patient Active Problem List   Diagnosis Date Noted   Acute on chronic systolic (congestive) heart failure (HCC) 03/26/2022   Hypokalemia 03/26/2022   Coronary artery disease 03/26/2022   Gastroesophageal reflux disease 03/26/2022   Hypothyroidism 03/26/2022   Long term (current) use of anticoagulants    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)    History of stroke    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   Chronic HFrEF (heart failure with reduced ejection fraction) (HCC) 07/29/2021   Anasarca 07/29/2021   Protein-calorie malnutrition, severe 07/29/2021   Non-STEMI (non-ST elevated myocardial infarction) (HCC)    Sinus pause    Deep vein thrombosis (DVT) of femoral vein of right lower extremity (HCC)    Failure to thrive in adult 07/28/2021   Digitalis toxicity 07/28/2021   Pulmonary embolus (HCC)    Atrial fibrillation with RVR (HCC) 06/20/2021   Paroxysmal atrial fibrillation (HCC) 06/19/2021   AKI (acute kidney injury) (HCC) 06/19/2021   Microcytosis 06/19/2021   Bradycardia 04/04/2020   Acute metabolic encephalopathy 04/04/2020   Elevated troponin 04/04/2020   Essential hypertension 11/23/2018   Controlled diabetes mellitus type II without complication (HCC) 11/23/2018   Obesity 11/23/2018   PCP:  Merri Brunette, MD Pharmacy:  [facility] [address] [place] [address] Phone: [phone] Fax: [phone]     Social Determinants of Health (SDOH) Interventions    Readmission Risk Interventions     No data to display

## 2022-03-26 NOTE — ED Triage Notes (Signed)
Patient arrived from home with failure to thrive. Patient has become less ambulatory per home health staff and has increased edema to lower legs . Patient denies pain, states that she is scared to ambulate

## 2022-03-26 NOTE — Assessment & Plan Note (Addendum)
Relatively new diagnosis Continue home synthroid

## 2022-03-26 NOTE — Assessment & Plan Note (Deleted)
76 year old with history of systolic heart failure and RHF secondary to PE in 06/2021 who presented to ED with worsening leg swelling and decline in ADL function found to be in acute on chronic systolic CHF exacerbation based off elevated BNP>1000, findings of new moderate right pleural effusion, increased LE edema.  -obs to telemetry -strict I/O -daily weights -received 40mg  IV lasix, will increase this to BID -repeat echo, make sure RV function has improved -last echo in 10/22 showed EF of 40-45% with mildly decreased LVF. RVF moderately reduced with submassive PE -cardiology consult depending on echo results and diuresis effect

## 2022-03-26 NOTE — Assessment & Plan Note (Addendum)
A1C in 2022 was 6.2,  Patient was placed on insulin sliding scale for glucose cover and monitoring with insulin sliding scale.

## 2022-03-26 NOTE — Assessment & Plan Note (Addendum)
Continue eliquis/statin

## 2022-03-26 NOTE — Assessment & Plan Note (Addendum)
Improved blood pressure with midodrine. Resumed metoprolol for heart rate control, low dose.

## 2022-03-26 NOTE — Assessment & Plan Note (Signed)
Recent LHC with no obstructive disease Continue statin/eliquis

## 2022-03-26 NOTE — Assessment & Plan Note (Addendum)
Hx of submassive PE and chronic DVT in 06/2021 Continue eliquis

## 2022-03-26 NOTE — Assessment & Plan Note (Addendum)
Replaced with IV and PO Trend and monitor with IV diuresis  Check magnesium  Was put on daily outpatient, but ran out of this per her son.

## 2022-03-26 NOTE — Assessment & Plan Note (Addendum)
Continue pepcid PRN, no longer on chronic PPI therapy

## 2022-03-26 NOTE — Plan of Care (Signed)
  Problem: Education: Goal: Ability to describe self-care measures that may prevent or decrease complications (Diabetes Survival Skills Education) will improve Outcome: Progressing Goal: Individualized Educational Video(s) Outcome: Progressing   

## 2022-03-26 NOTE — Assessment & Plan Note (Signed)
Loculated chronic right pleural effusion.  Continue diuresis as tolerated.

## 2022-03-26 NOTE — Assessment & Plan Note (Addendum)
This am patient with sinus rhythm,  Continue amiodarone and metoprolol Anticoagulation with apixaban.

## 2022-03-26 NOTE — Consult Note (Signed)
WOC Nurse Consult Note: Reason for Consult:Patient with Stage 2 PI noted on admission by EDP. Recent history of decreased mobility and appetite x 6 months noted. Patient not out of bed, Caregivers come to home for bathing. Wound type:Pressure, shear Pressure Injury POA: Yes Measurement:Bedside RN to measure and document measurements of pressure injury on Nursing Flow Sheet Wound LTR:VUYE, moist Drainage (amount, consistency, odor) scant Periwound:intact Dressing procedure/placement/frequency:Pressure injury prevention and treatment interventions are placed including those for topical care (xeroform topped with silicone foam dressing), flotation of heels through the use of Prevalon Boots, turning and repositioning with minimizing of time in the supine position.  A pressure redistribution chair cushion is provided for time OOB in the chair.  A consultation with an RD for wound healing is recommended.  Recommend LE elevation with feet above heart level for 30 minutes twice daily.  WOC nursing team will not follow, but will remain available to this patient, the nursing and medical teams.  Please re-consult if needed.  Thank you for inviting [place] to participate in this patient's Plan of Care.  Ladona Mow, MSN, RN, CNS, GNP, Leda Min, [employer], [employer] phone:  [phone]

## 2022-03-26 NOTE — Assessment & Plan Note (Addendum)
Hypokalemia. Hypomagnesemia   Renal function with serum cr at 1,0 with K at 4,1 and serum bicarbonate at 28. Continue diuresis with furosemide.

## 2022-03-26 NOTE — Assessment & Plan Note (Signed)
Secondary to being bed bound Turn patient Wound care  Stage 2 coccygeal (present on admission).

## 2022-03-26 NOTE — H&P (Signed)
History and Physical    Patient: Ashley [name] YWV:[id number] DOB: 08-May-1946 DOA: 03/26/2022 DOS: the patient was seen and examined on 03/26/2022 PCP: Merri Brunette, MD  Patient coming from: Home - lives with her son, uses walker at times.    Chief Complaint: leg swelling, ambulatory dysfunction   HPI: Ashley [name] is a 76 y.o. female with medical history significant of HTN, hx of CVA, T2DM, CAD, systolic CHF, hx of PE, PAF on eliquis, hypothyroidism, GERD who presented to Ed with complaints of bilateral leg swelling and difficulty walking. Her caregiver was concerned about the swelling so called 911. She states they told her she has lost weight. No orthopnea, no coughing or shortness of breath. Talked to her son who states she came home from [facility] re[facility] on 09/23/21. Since she has been home, she has not been up out of the bed. Caregivers come to help bath and change her. He isn't sure why she was sent home. He states she came here due to the swelling in her legs and immobility. He can not longer take care of her at home.   She does not smoke or drink alcohol.    Denies any fever/chills, vision changes/headaches, chest pain or palpitations, shortness of breath or cough, abdominal pain, N/V/D, dysuria.     ER Course:  vitals: afebrile, bp: 102/61, HR: 52, RR: 20, oxygen: 96%RA Pertinent labs: potassium 2.7, creatinine 1.13, BnP:1904 CXR: cardiac enlargement. Moderate right pleural effusion with decreased aeration to the right midlung and right base CT head: no acute finding Left foot: marked soft tissue swelling about the left foot  Right foot: no fracture In ED: given potassium orally and IV and 40mg  IV lasix  TRH asked to admit.     Review of Systems: As mentioned in the history of present illness. All other systems reviewed and are negative. Past Medical History:  Diagnosis Date   Essential hypertension 11/23/2018   Obesity 11/23/2018   Stroke [facility])     Past Surgical History:  Procedure Laterality Date   CARDIOVERSION N/A 06/21/2021   Procedure: CARDIOVERSION;  Surgeon: 08/21/2021, MD;  Location: [facility] ENDOSCOPY;  Service: Cardiovascular;  Laterality: N/A;   LEFT HEART CATH AND CORONARY ANGIOGRAPHY N/A 07/29/2021   Procedure: LEFT HEART CATH AND CORONARY ANGIOGRAPHY;  Surgeon: 13/06/2021, MD;  Location: [facility] INVASIVE CV LAB;  Service: Cardiovascular;  Laterality: N/A;   TEE WITHOUT CARDIOVERSION N/A 06/21/2021   Procedure: TRANSESOPHAGEAL ECHOCARDIOGRAM (TEE);  Surgeon: 08/21/2021, MD;  Location: [facility] ENDOSCOPY;  Service: Cardiovascular;  Laterality: N/A;   Social History:  reports that she has never smoked. She has never been exposed to tobacco smoke. She has never used smokeless tobacco. She reports that she does not drink alcohol and does not use drugs.  Allergies  Allergen Reactions   Bystolic [Nebivolol Hcl] Other (See Comments)    Bradycardia    Coreg [Carvedilol] Other (See Comments)    bradycardia   Minoxidil Other (See Comments)    Abdominal pain    Family History  Problem Relation Age of Onset   Stroke Mother    Hypertension Mother     Prior to Admission medications   Medication Sig Start Date End Date Taking? Authorizing Provider  acetaminophen (TYLENOL) 500 MG tablet Take 500 mg by mouth daily as needed for mild pain or headache.    [provider]  amiodarone (PACERONE) 100 MG tablet Take 1 tablet (100 mg total) by mouth  daily. 08/01/21 08/31/21  Hughie Closs, MD  amiodarone (PACERONE) 200 MG tablet Take 0.5 tablets (100 mg total) by mouth daily. 12/21/21     apixaban (ELIQUIS) 5 MG TABS tablet Take 1 tablet (5 mg total) by mouth 2 (two) times daily. 06/30/21   Leroy Sea, MD  apixaban (ELIQUIS) 5 MG TABS tablet Take 1 tablet (5 mg total) by mouth 2 (two) times daily. 11/23/21     famotidine (PEPCID) 20 MG tablet Take 1 tablet (20 mg total) by mouth daily. 06/27/21 06/27/22  Leroy Sea, MD  famotidine (PEPCID) 20 MG tablet Take 1 tab by mouth daily 10/14/21     famotidine (PEPCID) 20 MG tablet Take 1 tablet (20 mg total) by mouth at bedtime as needed. 11/23/21     furosemide (LASIX) 20 MG tablet Take 1 tablet (20 mg total) by mouth daily as needed for excessive swelling. 03/03/22     levothyroxine (SYNTHROID) 25 MCG tablet Take 1 tablet (25 mcg total) by mouth in the morning on an empty stomach 02/23/22     losartan (COZAAR) 25 MG tablet Take 0.5 tablets (12.5 mg total) by mouth daily. 06/27/21 08/26/21  Leroy Sea, MD  losartan (COZAAR) 25 MG tablet Take 0.5 tablets (12.5 mg total) by mouth daily. 11/23/21     metoprolol succinate (TOPROL-XL) 25 MG 24 hr tablet Take 1 tablet (25 mg total) by mouth daily. 11/23/21     metoprolol tartrate (LOPRESSOR) 25 MG tablet Take 1 tablet (25 mg total) by mouth 2 (two) times daily. 08/01/21 08/31/21  Hughie Closs, MD  metoprolol tartrate (LOPRESSOR) 25 MG tablet Take 1 tablet (25 mg total) by mouth 2 (two) times daily. 10/14/21     mirtazapine (REMERON) 15 MG tablet Take 1 tablet by mouth daily 10/14/21     mirtazapine (REMERON) 15 MG tablet Take 1 tablet (15 mg total) by mouth at bedtime. 11/23/21     omeprazole (PRILOSEC) 20 MG capsule Take 20 mg by mouth daily.    [provider]  omeprazole (PRILOSEC) 20 MG capsule Take 1 capsule (20 mg total) by mouth daily. 10/14/21     potassium chloride (KLOR-CON) 10 MEQ tablet Take 1 tablet (10 mEq total) by mouth daily with food 12/22/21     potassium chloride SA (KLOR-CON M) 20 MEQ tablet Take 2 tablets by mouth today Thursday 5.4.23 and then take 1 tablet daily starting Friday 5.5.23 01/20/22     rosuvastatin (CRESTOR) 5 MG tablet Take 1 tablet (5 mg total) by mouth daily. 07/02/21   Cantwell, Celeste C, PA-C  rosuvastatin (CRESTOR) 5 MG tablet Take 1 tablet (5 mg total) by mouth daily. 10/14/21     spironolactone (ALDACTONE) 25 MG tablet Take 0.5 tablets (12.5 mg total) by mouth daily.  08/01/21   Hughie Closs, MD  spironolactone (ALDACTONE) 25 MG tablet Take 1 tablet (25 mg total) by mouth daily. 10/25/21     spironolactone (ALDACTONE) 25 MG tablet Take 1 tablet (25 mg total) by mouth daily. 11/23/21     spironolactone (ALDACTONE) 25 MG tablet Take 1 tablet (25 mg total) by mouth daily. 12/10/21       Physical Exam: Vitals:   03/26/22 1730 03/26/22 1800 03/26/22 1831 03/26/22 1855  BP:  125/84 107/78 (!) 128/96  Pulse:   (!) 102   Resp:   17   Temp: 97.8 F (36.6 C)  (!) 97.5 F (36.4 C)   TempSrc: Oral  Oral   SpO2:  100%   Weight:   88.2 kg   Height:   5\' 8"  (1.727 m)    General:  Appears calm and comfortable and is in NAD Eyes:  PERRL, EOMI, normal lids, iris ENT:  grossly normal hearing, lips & tongue, mmm; appropriate dentition Neck:  no LAD, masses or thyromegaly; no carotid bruits, mild JVD Cardiovascular:  irregularly, irregular, no m/r/g. +LE edema 3+pitting to below the knee  Respiratory:   decreased breath sounds RLL, otherwise  Normal respiratory effort. Abdomen:  soft, NT, ND, NABS Back:   normal alignment, no CVAT Skin:  no rash or induration seen on limited exam Musculoskeletal:  grossly normal tone BUE/BLE, good ROM, no bony abnormality Lower extremity:  Limited foot exam with no ulcerations.  2+ distal pulses. Psychiatric:  grossly normal mood and affect, speech fluent and appropriate, AOx3 Neurologic:  CN 2-12 grossly intact, moves all extremities in coordinated fashion, sensation intact   Radiological Exams on Admission: Independently reviewed - see discussion in A/P where applicable  DG Foot Complete Left  Result Date: 03/26/2022 CLINICAL DATA:  Swelling. EXAM: LEFT FOOT - COMPLETE 3+ VIEW COMPARISON:  None Available. FINDINGS: There is no evidence of fracture or dislocation. No radiographic evidence of osteomyelitis. Marked soft tissue swelling about the left foot. IMPRESSION: 1. No radiographic evidence of osteomyelitis. 2. Marked soft  tissue swelling about the left foot. Electronically Signed   By: Fidela Salisbury M.D.   On: 03/26/2022 15:41   DG Foot Complete Right  Result Date: 03/26/2022 CLINICAL DATA:  Foot ulceration EXAM: RIGHT FOOT COMPLETE - 3+ VIEW COMPARISON:  None Available. FINDINGS: There is no evidence of fracture or dislocation. There is no evidence of arthropathy or other focal bone abnormality. Diffuse soft tissue edema about the foot and ankle. IMPRESSION: No fracture or dislocation of the right foot. Diffuse soft tissue edema about the foot and ankle. Electronically Signed   By: Delanna Ahmadi M.D.   On: 03/26/2022 15:40   DG Chest Portable 1 View  Result Date: 03/26/2022 CLINICAL DATA:  Leg swelling. EXAM: PORTABLE CHEST 1 VIEW COMPARISON:  07/28/21. FINDINGS: Cardiac enlargement, unchanged. There is a moderate right pleural effusion with decreased aeration to the right midlung and right base. Left lung appears clear. No definite left pleural effusion. Visualized osseous structures are unremarkable. IMPRESSION: 1. Cardiac enlargement. 2. Moderate right pleural effusion with decreased aeration to the right midlung and right base. Electronically Signed   By: Kerby Moors M.D.   On: 03/26/2022 14:13   CT Head Wo Contrast  Result Date: 03/26/2022 CLINICAL DATA:  Altered mental status EXAM: CT HEAD WITHOUT CONTRAST TECHNIQUE: Contiguous axial images were obtained from the base of the skull through the vertex without intravenous contrast. RADIATION DOSE REDUCTION: This exam was performed according to the departmental dose-optimization program which includes automated exposure control, adjustment of the mA and/or kV according to patient size and/or use of iterative reconstruction technique. COMPARISON:  CT head 04/04/2020 FINDINGS: Brain: No acute intracranial hemorrhage, mass effect, or herniation. No extra-axial fluid collections. No evidence of acute territorial infarct. No hydrocephalus. Mild cortical volume loss.  Patchy hypodensities throughout the periventricular and subcortical white matter, likely secondary to chronic microvascular ischemic changes. Patchy areas of encephalomalacia in the right temporal and parietal lobe secondary to old infarct. Vascular: No hyperdense vessel or unexpected calcification. Skull: Normal. Negative for fracture or focal lesion. Sinuses/Orbits: Small fluid levels in the right sphenoid sinus and left maxillary sinus. Other: Near-complete opacification of the right  mastoid air cells and mild opacification of the left mastoid air cells, new since previous study. IMPRESSION: 1. No acute intracranial process identified. Chronic changes as described. 2. Correlate for possible acute left maxillary and right sphenoid sinusitis. 3. Bilateral mastoid air cell opacification right-greater-than-left, new since previous study. Correlate clinically. Electronically Signed   By: Jannifer Hick M.D.   On: 03/26/2022 13:43    EKG: Independently reviewed.  Atrial fib/flutter with rate 110; nonspecific ST changes with no evidence of acute ischemia   Labs on Admission: I have personally reviewed the available labs and imaging studies at the time of the admission.  Pertinent labs:   potassium 2.7,  creatinine 1.13,  BnP:1904  Assessment and Plan: Principal Problem:   Acute on chronic systolic (congestive) heart failure (HCC) Active Problems:   AKI (acute kidney injury) (HCC)   Hypokalemia   Pleural effusion, right   Paroxysmal atrial fibrillation (HCC)   Pulmonary embolus (HCC)   Controlled diabetes mellitus type II without complication (HCC)   Essential hypertension   History of stroke   Coronary artery disease   Hypothyroidism   Gastroesophageal reflux disease   Pressure ulcer    Assessment and Plan: * Acute on chronic systolic (congestive) heart failure (HCC) 76 year old with history of systolic heart failure and RHF secondary to PE in 06/2021 who presented to ED with worsening  leg swelling and decline in ADL function found to be in acute on chronic systolic CHF exacerbation based off elevated BNP>1000, findings of new moderate right pleural effusion, increased LE edema.  -obs to telemetry -strict I/O -daily weights -received 40mg  IV lasix, will increase this to BID -repeat echo, make sure RV function has improved -last echo in 10/22 showed EF of 40-45% with mildly decreased LVF. RVF moderately reduced with submassive PE -cardiology consult depending on echo results and diuresis effect    AKI (acute kidney injury) (HCC) Baseline creatinine appears to be normal Mildly elevated and likely cardiorenal in setting of known RVF/systolic CHf exacerbation Monitor closely with diuresis  UA Strict I/O Hold ARB/spiro for now  Trend   Hypokalemia Replaced with 11/22 IV and PO Trend and monitor with IV diuresis  Check magnesium  Was put on daily outpatient, but ran out of this per her son.   Pleural effusion, right Likely secondary to acute on chronic CHF No shortness of breath, cough or hypoxia Will see if improves with diuresis   Pulmonary embolus (HCC) Hx of submassive PE and chronic DVT in 06/2021 Continue eliquis   Paroxysmal atrial fibrillation (HCC) Continue amiodarone and toprol-xl Continue eliquis  Controlled diabetes mellitus type II without complication (HCC) A1C in 2022 was 6.2, update now Diet controlled Continue carb modified diet, SSI and accuchecks QAC/HS  Essential hypertension Continue toprol-xl Hold ARB/spironolactone with AKI and soft readings   History of stroke Continue eliquis/statin  Coronary artery disease Recent LHC with no obstructive disease Continue statin/eliquis  Hypothyroidism Relatively new diagnosis Check TSH Continue home synthroid   Gastroesophageal reflux disease Continue pepcid PRN, no longer on chronic PPI therapy   Pressure ulcer Secondary to being bed bound Turn patient Wound care      Advance Care Planning:   Code Status: Full Code   Consults: PT/OT/SW/wound care   DVT Prophylaxis: eliquis   Family Communication: updated her son by phone: 2023 [phone]  Severity of Illness: The appropriate patient status for this patient is INPATIENT. Inpatient status is judged to be reasonable and necessary in  order to provide the required intensity of service to ensure the patient's safety. The patient's presenting symptoms, physical exam findings, and initial radiographic and laboratory data in the context of their chronic comorbidities is felt to place them at high risk for further clinical deterioration. Furthermore, it is not anticipated that the patient will be medically stable for discharge from the hospital within 2 midnights of admission.   * I certify that at the point of admission it is my clinical judgment that the patient will require inpatient hospital care spanning beyond 2 midnights from the point of admission due to high intensity of service, high risk for further deterioration and high frequency of surveillance required.*  Author: Orma Flaming, MD 03/26/2022 7:14 PM  For on call review [email or web address].

## 2022-03-26 NOTE — ED Provider Notes (Signed)
Physical Exam  BP (!) 128/96   Pulse (!) 102   Temp (!) 97.5 F (36.4 C) (Oral)   Resp 17   Ht 5\' 8"  (1.727 m)   Wt 88.2 kg   SpO2 100%   BMI 29.57 kg/m   Physical Exam Vitals and nursing note reviewed.  Constitutional:      General: She is not in acute distress.    Appearance: She is well-developed.  HENT:     Head: Atraumatic.  Eyes:     Conjunctiva/sclera: Conjunctivae normal.  Cardiovascular:     Rate and Rhythm: Normal rate and regular rhythm.     Heart sounds: No murmur heard. Pulmonary:     Effort: Pulmonary effort is normal. No respiratory distress.     Breath sounds: Normal breath sounds.  Abdominal:     Palpations: Abdomen is soft.     Tenderness: There is no abdominal tenderness.  Musculoskeletal:        General: No swelling, deformity or signs of injury.     Cervical back: Neck supple.     Right lower leg: Edema present.     Left lower leg: Edema present.     Comments: 3+ pitting edema  Skin:    General: Skin is warm and dry.     Capillary Refill: Capillary refill takes less than 2 seconds.  Neurological:     Mental Status: She is alert.  Psychiatric:        Mood and Affect: Mood normal.     Procedures  Procedures  ED Course / MDM    Medical Decision Making Problems Addressed: Acute on chronic congestive heart failure, unspecified heart failure type [facility]): chronic illness or injury with exacerbation, progression, or side effects of treatment Hypokalemia: complicated acute illness or injury with systemic symptoms  Amount and/or Complexity of Data Reviewed Independent Historian: caregiver and EMS External Data Reviewed: labs, radiology, ECG and notes. Labs: ordered. Radiology: ordered. ECG/medicine tests: ordered.  Risk Prescription drug management. Decision regarding hospitalization.    Patient is a 76 year old female with a history of DM2, HTN, HLD, CVA (03/2020), HFrEF (40-45% 06/2021), submassive PE (04/2021) on Eliquis, atrial  tachycardia on digoxin and amiodarone who presented to the emergency department from home in the setting of failure to thrive.  Patient has become less ambulatory at home according to her home health staff.  She had increased edema to her bilateral lower legs.  She denies any pain though she states that she is scared to ambulate.  Per old records her most recent left heart cath was performed on 11/22 which showed right dominant circulation and tortuous vessels with no significant coronary artery disease.  In the setting of her new onset difficulty with wanting to ambulate a CT head without contrast was performed that did not show any acute intracranial process.  There were chronic changes mild cortical volume loss and patchy hypodensities throughout the periventricular and subcortical white matter likely due to chronic microvascular ischemic changes.  Chest x-ray performed in the ED showed cardiac enlargement and moderate right pleural effusion with decreased aeration at the right midlung and right base.  X-rays of the bilateral feet also performed and showed soft tissue swelling but no underlying acute fractures or dislocations.  Initial laboratory work performed showed the patient had a BNP of 1924.  Her hepatic function panel did not show any evidence of transaminitis.  Creatinine slightly elevated 1.13.  Potassium significantly low at 2.7.  No evidence of leukocytosis or anemia.  EKG did not show any acute ST or T wave abnormalities to suggest underlying ACS.  In the setting of fluid overload and difficulty with ambulation hospitalist was consulted for admission for this patient.  Given her significant pitting edema she was provided with 40 of IV Lasix as well as 40 mEq of p.o. potassium chloride.  Hospitalist agreed to admit the patient to their service.  Plan and findings discussed with patient at bedside verbalized understanding was agreeable.  Please see inpatient provider notes for additional  details regarding this patient's ongoing hospital course and management.     Tommie Raymond, MD 03/26/22 1610    Glendora Score, MD 03/27/22 0200

## 2022-03-27 DIAGNOSIS — I5023 Acute on chronic systolic (congestive) heart failure: Secondary | ICD-10-CM | POA: Diagnosis not present

## 2022-03-27 LAB — GLUCOSE, CAPILLARY
Glucose-Capillary: 82 mg/dL (ref 70–99)
Glucose-Capillary: 96 mg/dL (ref 70–99)
Glucose-Capillary: 104 mg/dL — ABNORMAL HIGH (ref 70–99)

## 2022-03-27 LAB — URINALYSIS, ROUTINE W REFLEX MICROSCOPIC
Bilirubin Urine: NEGATIVE
Glucose, UA: NEGATIVE mg/dL
Ketones, ur: NEGATIVE mg/dL
Nitrite: NEGATIVE
Protein, ur: NEGATIVE mg/dL
Specific Gravity, Urine: 1.006 (ref 1.005–1.030)
pH: 6 (ref 5.0–8.0)

## 2022-03-27 LAB — BASIC METABOLIC PANEL
Anion gap: 11 (ref 5–15)
BUN: 10 mg/dL (ref 8–23)
CO2: 28 mmol/L (ref 22–32)
Calcium: 8.6 mg/dL — ABNORMAL LOW (ref 8.9–10.3)
Chloride: 101 mmol/L (ref 98–111)
Creatinine, Ser: 1.1 mg/dL — ABNORMAL HIGH (ref 0.44–1.00)
GFR, Estimated: 52 mL/min — ABNORMAL LOW (ref >60)
Glucose, Bld: 99 mg/dL (ref 70–99)
Potassium: 3 mmol/L — ABNORMAL LOW (ref 3.5–5.1)
Sodium: 140 mmol/L (ref 135–145)

## 2022-03-27 LAB — CBC
HCT: 35 % — ABNORMAL LOW (ref 36.0–46.0)
Hemoglobin: 11.8 g/dL — ABNORMAL LOW (ref 12.0–15.0)
MCH: 24.8 pg — ABNORMAL LOW (ref 26.0–34.0)
MCHC: 33.7 g/dL (ref 30.0–36.0)
MCV: 73.7 fL — ABNORMAL LOW (ref 80.0–100.0)
Platelets: 198 10*3/uL (ref 150–400)
RBC: 4.75 MIL/uL (ref 3.87–5.11)
RDW: 19.8 % — ABNORMAL HIGH (ref 11.5–15.5)
WBC: 5.8 10*3/uL (ref 4.0–10.5)
nRBC: 0 % (ref 0.0–0.2)

## 2022-03-27 MED ORDER — POTASSIUM CHLORIDE CRYS ER 20 MEQ PO TBCR
40.0000 meq | EXTENDED_RELEASE_TABLET | Freq: Two times a day (BID) | ORAL | Status: AC
  Administered 2022-03-27 – 2022-03-28 (×3): 40 meq via ORAL
  Filled 2022-03-27 (×3): qty 2

## 2022-03-27 NOTE — Progress Notes (Signed)
PROGRESS NOTE    Ashley [name]  QQI:[id number] DOB: 1946-03-18 DOA: 03/26/2022 PCP: Merri Brunette, MD  75/M with history of chronic systolic CHF, CVA, type 2 diabetes mellitus, A-fib on Eliquis, GERD significant debility, bedbound and nonambulatory since January 2023 who was brought to the ED on account of worsening leg swelling, she denied any shortness of breath cough or orthopnea, lives at home, her son works, has a caregiver couple of hours a day.  In the ED vitals were stable, chest x-ray noted cardiac enlargement, moderate right pleural effusion, left foot noted soft tissue swelling   Subjective: -Feels okay, no events overnight  Assessment and Plan:  Acute on chronic systolic CHF Biventricular failure 76 year old with history of systolic heart failure and RHF secondary to PE in 06/2021 who presented to ED with worsening leg swelling and decline in ADL function found to be in acute on chronic systolic CHF exacerbation based off elevated BNP>1000, findings of new moderate right pleural effusion, increased LE edema.  -Continue IV Lasix, she is 1.1 L negative -Follow-up repeat echo, previous echo 10/22 noted EF of 40-45% with moderately decreased RV function  Left foot discoloration -Suspected PAD, check ABIs  AKI (acute kidney injury) (HCC) ARB and Aldactone held by admitting MD yesterday, creatinine is stable will resume Aldactone tomorrow  Hypokalemia -Replaced  Pleural effusion, right -Likely related to CHF, monitor with diuresis, repeat x-ray in 2 to 3 days  History of pulmonary embolus (HCC) Hx of submassive PE and chronic DVT in 06/2021 Continue eliquis   Paroxysmal atrial fibrillation (HCC) Continue amiodarone and toprol-xl Continue eliquis  Controlled diabetes mellitus type II without complication (HCC) A1C in 2022 was 6.2, update now Diet controlled Continue carb modified diet, SSI and accuchecks QAC/HS  Essential hypertension Continue  toprol-xl  History of stroke Continue eliquis/statin  Coronary artery disease Recent LHC with no obstructive disease Continue statin/eliquis  Hypothyroidism -Recently started on Synthroid, will hold off on titrating dose up at this time  Gastroesophageal reflux disease Continue pepcid PRN, no longer on chronic PPI therapy   Pressure ulcer Secondary to being bed bound -Wound care  DVT prophylaxis: Eliquis Code Status: Full code, discussed CODE STATUS with the patient Family Communication: Discussed patient in detail, no family at bedside Disposition Plan: Declines SNF  Consultants:    Procedures:   Antimicrobials:    Objective: Vitals:   03/27/22 0340 03/27/22 0447 03/27/22 0720 03/27/22 1121  BP:  113/88 107/84 91/76  Pulse:  89 87 83  Resp:  17 15 15   Temp:  97.6 F (36.4 C) (!) 97.4 F (36.3 C)   TempSrc:  Oral Oral   SpO2:  93% 100% 100%  Weight: 92 kg     Height:        Intake/Output Summary (Last 24 hours) at 03/27/2022 1427 Last data filed at 03/27/2022 1120 Gross per 24 hour  Intake 360 ml  Output 1950 ml  Net -1590 ml   Filed Weights   03/26/22 1831 03/27/22 0340  Weight: 88.2 kg 92 kg    Examination:  General exam: Obese chronically ill female laying in bed, AAOx3, no distress HEENT: No JVD CVS: S1-S2, regular rhythm Lungs: Decreased breath sounds the bases Abdomen: Soft, nontender, bowel sounds present Extremities: 1+ edema, cold feet bilaterally, hyperpigmentation, demarcated discoloration on dorsum of left foot Psychiatry:  Mood & affect appropriate.     Data Reviewed:   CBC: Recent Labs  Lab 03/26/22 1030 03/27/22 0149  WBC 7.3 5.8  HGB 12.3 11.8*  HCT 38.0 35.0*  MCV 76.2* 73.7*  PLT 197 198   Basic Metabolic Panel: Recent Labs  Lab 03/26/22 1030 03/26/22 1851 03/27/22 0149  NA 140  --  140  K 2.7*  --  3.0*  CL 100  --  101  CO2 27  --  28  GLUCOSE 107*  --  99  BUN 10  --  10  CREATININE 1.13*  --  1.10*   CALCIUM 8.8*  --  8.6*  MG  --  1.5*  --    GFR: Estimated Creatinine Clearance: 52.4 mL/min (A) (by C-G formula based on SCr of 1.1 mg/dL (H)). Liver Function Tests: Recent Labs  Lab 03/26/22 1030  AST 17  ALT 11  ALKPHOS 82  BILITOT 1.6*  PROT 6.0*  ALBUMIN 2.1*   No results for input(s): "LIPASE", "AMYLASE" in the last 168 hours. No results for input(s): "AMMONIA" in the last 168 hours. Coagulation Profile: No results for input(s): "INR", "PROTIME" in the last 168 hours. Cardiac Enzymes: No results for input(s): "CKTOTAL", "CKMB", "CKMBINDEX", "TROPONINI" in the last 168 hours. BNP (last 3 results) No results for input(s): "PROBNP" in the last 8760 hours. HbA1C: Recent Labs    03/26/22 1851  HGBA1C 5.1   CBG: Recent Labs  Lab 03/26/22 1841 03/26/22 2202 03/27/22 0648 03/27/22 1118  GLUCAP 90 138* 82 96   Lipid Profile: No results for input(s): "CHOL", "HDL", "LDLCALC", "TRIG", "CHOLHDL", "LDLDIRECT" in the last 72 hours. Thyroid Function Tests: Recent Labs    03/26/22 1851  TSH 5.683*   Anemia Panel: No results for input(s): "VITAMINB12", "FOLATE", "FERRITIN", "TIBC", "IRON", "RETICCTPCT" in the last 72 hours. Urine analysis:    Component Value Date/Time   COLORURINE YELLOW 03/26/2022 1023   APPEARANCEUR CLEAR 03/26/2022 1023   LABSPEC 1.006 03/26/2022 1023   PHURINE 6.0 03/26/2022 1023   GLUCOSEU NEGATIVE 03/26/2022 1023   HGBUR SMALL (A) 03/26/2022 1023   BILIRUBINUR NEGATIVE 03/26/2022 1023   KETONESUR NEGATIVE 03/26/2022 1023   PROTEINUR NEGATIVE 03/26/2022 1023   NITRITE NEGATIVE 03/26/2022 1023   LEUKOCYTESUR LARGE (A) 03/26/2022 1023   Sepsis Labs: @LABRCNTIP (procalcitonin:4,lacticidven:4)  )No results found for this or any previous visit (from the past 240 hour(s)).   Radiology Studies: DG Foot Complete Left  Result Date: 03/26/2022 CLINICAL DATA:  Swelling. EXAM: LEFT FOOT - COMPLETE 3+ VIEW COMPARISON:  None Available. FINDINGS:  There is no evidence of fracture or dislocation. No radiographic evidence of osteomyelitis. Marked soft tissue swelling about the left foot. IMPRESSION: 1. No radiographic evidence of osteomyelitis. 2. Marked soft tissue swelling about the left foot. Electronically Signed   By: 05/27/2022 M.D.   On: 03/26/2022 15:41   DG Foot Complete Right  Result Date: 03/26/2022 CLINICAL DATA:  Foot ulceration EXAM: RIGHT FOOT COMPLETE - 3+ VIEW COMPARISON:  None Available. FINDINGS: There is no evidence of fracture or dislocation. There is no evidence of arthropathy or other focal bone abnormality. Diffuse soft tissue edema about the foot and ankle. IMPRESSION: No fracture or dislocation of the right foot. Diffuse soft tissue edema about the foot and ankle. Electronically Signed   By: 05/27/2022 M.D.   On: 03/26/2022 15:40   DG Chest Portable 1 View  Result Date: 03/26/2022 CLINICAL DATA:  Leg swelling. EXAM: PORTABLE CHEST 1 VIEW COMPARISON:  07/28/21. FINDINGS: Cardiac enlargement, unchanged. There is a moderate right pleural effusion with decreased aeration to the right midlung and right base. Left lung  appears clear. No definite left pleural effusion. Visualized osseous structures are unremarkable. IMPRESSION: 1. Cardiac enlargement. 2. Moderate right pleural effusion with decreased aeration to the right midlung and right base. Electronically Signed   By: Signa Kell M.D.   On: 03/26/2022 14:13   CT Head Wo Contrast  Result Date: 03/26/2022 CLINICAL DATA:  Altered mental status EXAM: CT HEAD WITHOUT CONTRAST TECHNIQUE: Contiguous axial images were obtained from the base of the skull through the vertex without intravenous contrast. RADIATION DOSE REDUCTION: This exam was performed according to the departmental dose-optimization program which includes automated exposure control, adjustment of the mA and/or kV according to patient size and/or use of iterative reconstruction technique. COMPARISON:  CT head  04/04/2020 FINDINGS: Brain: No acute intracranial hemorrhage, mass effect, or herniation. No extra-axial fluid collections. No evidence of acute territorial infarct. No hydrocephalus. Mild cortical volume loss. Patchy hypodensities throughout the periventricular and subcortical white matter, likely secondary to chronic microvascular ischemic changes. Patchy areas of encephalomalacia in the right temporal and parietal lobe secondary to old infarct. Vascular: No hyperdense vessel or unexpected calcification. Skull: Normal. Negative for fracture or focal lesion. Sinuses/Orbits: Small fluid levels in the right sphenoid sinus and left maxillary sinus. Other: Near-complete opacification of the right mastoid air cells and mild opacification of the left mastoid air cells, new since previous study. IMPRESSION: 1. No acute intracranial process identified. Chronic changes as described. 2. Correlate for possible acute left maxillary and right sphenoid sinusitis. 3. Bilateral mastoid air cell opacification right-greater-than-left, new since previous study. Correlate clinically. Electronically Signed   By: Jannifer Hick M.D.   On: 03/26/2022 13:43     Scheduled Meds:  amiodarone  100 mg Oral Daily   apixaban  5 mg Oral BID   feeding supplement  237 mL Oral BID BM   furosemide  40 mg Intravenous Q12H   insulin aspart  0-9 Units Subcutaneous TID WC   levothyroxine  25 mcg Oral Q0600   metoprolol succinate  25 mg Oral Daily   potassium chloride  40 mEq Oral BID   rosuvastatin  5 mg Oral Daily   sodium chloride flush  3 mL Intravenous Q12H   Continuous Infusions:  sodium chloride       LOS: 1 day    Time spent:    Zannie Cove, MD [facility]   03/27/2022, 2:27 PM

## 2022-03-27 NOTE — Evaluation (Signed)
Physical Therapy Evaluation Patient Details Name: Ashley [name] MRN: [id number] DOB: [birth year] Today's Date: 03/27/2022  History of Present Illness  76 yo admitted 7/8 with edema and CHF with noted sacral wound. PMhx: CHF, CAD, GERD, hypothyroidism, DVT, AFib, HTN, DM, FTT  Clinical Impression  Pt initially pleasant and talkative but easily agitated with conversation of home environment and pt function. Son present throughout session stating pt has been bed bound since Jan and providing limited assist for him at home with pericare and linen change from regular bed. Pt is alone while he works daily other than 2 hours that aid is present. Pt able to sit EOB today but becomes nervous with being upright and unable to scoot in sitting. Pt with generalized weakness and deconditioning with decreased cognition who will benefit from long term SNF placement and will trial acute therapy to maximize mobiilty, balance, strength and function.       Recommendations for follow up therapy are one component of a multi-disciplinary discharge planning process, led by the attending physician.  Recommendations may be updated based on patient status, additional functional criteria and insurance authorization.  Follow Up Recommendations Long-term institutional care without follow-up therapy Can patient physically be transported by private vehicle: No    Assistance Recommended at Discharge Frequent or constant Supervision/Assistance  Patient can return home with the following  Two people to help with walking and/or transfers;A lot of help with bathing/dressing/bathroom;Assistance with feeding;Assistance with cooking/housework;Direct supervision/assist for financial management;Direct supervision/assist for medications management    Equipment Recommendations Hospital bed;Wheelchair (measurements PT);Other (comment) (hoyer. Son reports house to small for any medical equipment)  Recommendations for Other Services        Functional Status Assessment Patient has had a recent decline in their functional status and/or demonstrates limited ability to make significant improvements in function in a reasonable and predictable amount of time     Precautions / Restrictions Precautions Precautions: Fall;Other (comment) Precaution Comments: sacral wound, watch HR      Mobility  Bed Mobility Overal bed mobility: Needs Assistance Bed Mobility: Rolling, Supine to Sit, Sit to Supine Rolling: Min assist   Supine to sit: Min assist Sit to supine: Min assist   General bed mobility comments: min assist with rails to roll bil with cues for sequence and bending knee. Side to sit with pt able to move legs off of bed with assist and assist to elevate trunk. Return to bed with assist to lift both legs. unable to scoot laterally toward HOB at EOB    Transfers                   General transfer comment: pt denied attempting    Ambulation/Gait                  Stairs            Wheelchair Mobility    Modified Rankin (Stroke Patients Only)       Balance Overall balance assessment: Needs assistance Sitting-balance support: Bilateral upper extremity supported, Feet supported Sitting balance-Leahy Scale: Fair Sitting balance - Comments: static sitting EOB without physical assist, cues for posture                                     Pertinent Vitals/Pain Pain Assessment Pain Assessment: No/denies pain    Home Living Family/patient expects to be discharged to:: Private residence Living Arrangements: Children  Available Help at Discharge: Available PRN/intermittently Type of Home: House Home Access: Level entry       Home Layout: One level Home Equipment: Grab bars - toilet;Rollator (4 wheels);Cane - single point;Shower seat;Hand held shower head;Grab bars - tub/shower Additional Comments: bed with adjustable HOB. Son reports house is too small for [facility], Hoyer lift or  hospital bed even if rooms repurposed (ie bedroom moved to living room). Aid 2 hrs/day and son works so pt alone majority of day    Prior Function Prior Level of Function : Needs assist             Mobility Comments: has been bed bound since Jan due to fear of falling per son and niece. Pt stating she got OOB to a car and wC with sliding transfer (never occured per son) ADLs Comments: total assist for bathing/dressing at bed level. Family and aid doing homemaking     Hand Dominance        Extremity/Trunk Assessment   Upper Extremity Assessment Upper Extremity Assessment: Generalized weakness    Lower Extremity Assessment Lower Extremity Assessment: Generalized weakness (grossly 2/5 pt able to bend bil Knees and achieve 90/90 hip and knee flexion, limited ability to lift legs from surface and would not fully participate in further strength assessment)    Cervical / Trunk Assessment Cervical / Trunk Assessment: Other exceptions Cervical / Trunk Exceptions: increased body habitus with rounded shoulders  Communication      Cognition Arousal/Alertness: Awake/alert Behavior During Therapy: Flat affect Overall Cognitive Status: Impaired/Different from baseline Area of Impairment: Memory, Orientation, Attention                 Orientation Level: Disoriented to, Time, Situation, Place Current Attention Level: Focused Memory: Decreased short-term memory         General Comments: pt oriented only to self but then after education for orientation stating "I know that I'm not crazy". Pt confabulatory regarding function and getting OOB at home. Pt easily agitated with questions regarding home and function        General Comments      Exercises     Assessment/Plan    PT Assessment Patient needs continued PT services  PT Problem List Decreased range of motion;Decreased strength;Decreased mobility;Decreased safety awareness;Obesity;Decreased activity tolerance;Decreased  knowledge of use of DME       PT Treatment Interventions Balance training;Functional mobility training;Therapeutic activities;Patient/family education;Therapeutic exercise;DME instruction    PT Goals (Current goals can be found in the Care Plan section)  Acute Rehab PT Goals Patient Stated Goal: pt- return home, son- get pt help to move PT Goal Formulation: With patient/family Time For Goal Achievement: 04/10/22 Potential to Achieve Goals: Fair    Frequency Min 2X/week     Co-evaluation               AM-PAC PT "6 Clicks" Mobility  Outcome Measure Help needed turning from your back to your side while in a flat bed without using bedrails?: A Little Help needed moving from lying on your back to sitting on the side of a flat bed without using bedrails?: A Little Help needed moving to and from a bed to a chair (including a wheelchair)?: Total Help needed standing up from a chair using your arms (e.g., wheelchair or bedside chair)?: Total Help needed to walk in hospital room?: Total Help needed climbing 3-5 steps with a railing? : Total 6 Click Score: 10    End of Session   Activity  Tolerance: Patient tolerated treatment well Patient left: in bed;with call bell/phone within reach;with bed alarm set;with nursing/sitter in room;with family/visitor present Nurse Communication: Mobility status;Need for lift equipment PT Visit Diagnosis: Other abnormalities of gait and mobility (R26.89);Muscle weakness (generalized) (M62.81);Difficulty in walking, not elsewhere classified (R26.2)    Time: 2229-7989 PT Time Calculation (min) (ACUTE ONLY): 25 min   Charges:   PT Evaluation $PT Eval Moderate Complexity: 1 Mod PT Treatments $Therapeutic Activity: 8-22 mins        Merryl Hacker, PT Acute Rehabilitation Services Office: [phone]   Enedina Finner Lamisha Roussell 03/27/2022, 2:11 PM

## 2022-03-28 ENCOUNTER — Inpatient Hospital Stay (HOSPITAL_COMMUNITY): Payer: Medicare Other

## 2022-03-28 ENCOUNTER — Encounter (HOSPITAL_COMMUNITY): Payer: Medicare Other

## 2022-03-28 DIAGNOSIS — I5023 Acute on chronic systolic (congestive) heart failure: Secondary | ICD-10-CM | POA: Diagnosis not present

## 2022-03-28 DIAGNOSIS — I5021 Acute systolic (congestive) heart failure: Secondary | ICD-10-CM

## 2022-03-28 LAB — BASIC METABOLIC PANEL
Anion gap: 10 (ref 5–15)
BUN: 10 mg/dL (ref 8–23)
CO2: 30 mmol/L (ref 22–32)
Calcium: 8.7 mg/dL — ABNORMAL LOW (ref 8.9–10.3)
Chloride: 101 mmol/L (ref 98–111)
Creatinine, Ser: 1.15 mg/dL — ABNORMAL HIGH (ref 0.44–1.00)
GFR, Estimated: 50 mL/min — ABNORMAL LOW (ref >60)
Glucose, Bld: 97 mg/dL (ref 70–99)
Potassium: 3.3 mmol/L — ABNORMAL LOW (ref 3.5–5.1)
Sodium: 141 mmol/L (ref 135–145)

## 2022-03-28 LAB — ECHOCARDIOGRAM COMPLETE
Calc EF: 38 %
Height: 68 in
MV M vel: 4.14 m/s
MV Peak grad: 68.6 mmHg
S' Lateral: 3.7 cm
Single Plane A2C EF: 36.6 %
Single Plane A4C EF: 39.1 %
Weight: 3280.44 oz

## 2022-03-28 LAB — GLUCOSE, CAPILLARY
Glucose-Capillary: 103 mg/dL — ABNORMAL HIGH (ref 70–99)
Glucose-Capillary: 98 mg/dL (ref 70–99)
Glucose-Capillary: 99 mg/dL (ref 70–99)
Glucose-Capillary: 131 mg/dL — ABNORMAL HIGH (ref 70–99)
Glucose-Capillary: 113 mg/dL — ABNORMAL HIGH (ref 70–99)

## 2022-03-28 MED ORDER — ADULT MULTIVITAMIN W/MINERALS CH
1.0000 | ORAL_TABLET | Freq: Every day | ORAL | Status: DC
  Administered 2022-03-28 – 2022-04-04 (×5): 1 via ORAL
  Filled 2022-03-28 (×8): qty 1

## 2022-03-28 MED ORDER — SPIRONOLACTONE 25 MG PO TABS
25.0000 mg | ORAL_TABLET | Freq: Every day | ORAL | Status: DC
  Filled 2022-03-28: qty 1

## 2022-03-28 MED ORDER — PROSOURCE PLUS PO LIQD
30.0000 mL | Freq: Two times a day (BID) | ORAL | Status: DC
  Administered 2022-03-29 – 2022-04-04 (×14): 30 mL via ORAL
  Filled 2022-03-28 (×16): qty 30
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
## 2022-03-28 NOTE — Plan of Care (Signed)
  Problem: Health Behavior/Discharge Planning: Goal: Ability to manage health-related needs will improve Outcome: Progressing   Problem: Clinical Measurements: Goal: Ability to maintain clinical measurements within normal limits will improve Outcome: Progressing Goal: Will remain free from infection Outcome: Progressing Goal: Diagnostic test results will improve Outcome: Progressing Goal: Respiratory complications will improve Outcome: Progressing Goal: Cardiovascular complication will be avoided Outcome: Progressing   Problem: Nutrition: Goal: Adequate nutrition will be maintained Outcome: Progressing   Problem: Elimination: Goal: Will not experience complications related to bowel motility Outcome: Progressing Goal: Will not experience complications related to urinary retention Outcome: Progressing   Problem: Pain Managment: Goal: General experience of comfort will improve Outcome: Progressing   Problem: Safety: Goal: Ability to remain free from injury will improve Outcome: Progressing   Problem: Education: Goal: Knowledge of General Education information will improve Description: Including pain rating scale, medication(s)/side effects and non-pharmacologic comfort measures Outcome: Not Progressing   Problem: Activity: Goal: Risk for activity intolerance will decrease Outcome: Not Progressing   Problem: Coping: Goal: Level of anxiety will decrease Outcome: Not Progressing   Problem: Skin Integrity: Goal: Risk for impaired skin integrity will decrease Outcome: Not Progressing

## 2022-03-28 NOTE — Progress Notes (Signed)
PROGRESS NOTE    Ashley [name]  AOZ:[id number] DOB: 1946/06/23 DOA: 03/26/2022 PCP: Merri Brunette, MD  75/M with history of chronic systolic CHF, CVA, type 2 diabetes mellitus, A-fib on Eliquis, GERD significant debility, bedbound and nonambulatory since January 2023 who was brought to the ED on account of worsening leg swelling, she denied any shortness of breath cough or orthopnea, lives at home, her son works, has a caregiver couple of hours a day.  In the ED vitals were stable, chest x-ray noted cardiac enlargement, moderate right pleural effusion, left foot noted soft tissue swelling. -Improving with diuretics   Subjective: -Feels okay, no events overnight  Assessment and Plan:  Acute on chronic systolic CHF Biventricular failure 76 year old with history of systolic heart failure and RHF secondary to PE in 06/2021 who presented to ED with worsening leg swelling and decline in ADL function found to be in acute on chronic systolic CHF exacerbation based off elevated BNP>1000, findings of new moderate right pleural effusion, increased LE edema.  -Continue IV Lasix, she is 1.5 L negative, weight appears unreliable -Add Aldactone -Follow-up repeat echo, previous echo 10/22 noted EF of 40-45% with moderately decreased RV function  Pleural effusion, right -Likely related to CHF, monitor with diuresis, repeat x-ray today  Left foot discoloration -Both feet are warmer today, ABIs pending  AKI (acute kidney injury) (HCC) ARB and Aldactone held by admitting MD yesterday, creatinine is stable will resume Aldactone today  Hypokalemia -Replaced  History of pulmonary embolus (HCC) Hx of submassive PE and chronic DVT in 06/2021 Continue eliquis   Paroxysmal atrial fibrillation (HCC) Continue amiodarone and toprol-xl Continue eliquis  Controlled diabetes mellitus type II without complication (HCC) A1C in 2022 was 6.2, update now Diet controlled Continue carb modified diet, SSI  and accuchecks QAC/HS  Essential hypertension Continue toprol-xl  History of stroke Continue eliquis/statin  Coronary artery disease Recent LHC with no obstructive disease Continue statin/eliquis  Hypothyroidism -Recently started on Synthroid, will hold off on titrating dose up at this time  Gastroesophageal reflux disease Continue pepcid PRN, no longer on chronic PPI therapy   Pressure ulcer Secondary to being bed bound -Wound care  DVT prophylaxis: Eliquis Code Status: Full code, discussed CODE STATUS with the patient Family Communication: Discussed patient in detail, no family at bedside Disposition Plan: Declines SNF  Consultants:    Procedures:   Antimicrobials:    Objective: Vitals:   03/28/22 0957 03/28/22 1038 03/28/22 1100 03/28/22 1353  BP: 111/78 99/81 (!) 83/66 103/79  Pulse:   (!) 42   Resp: 19 13 10 18   Temp:      TempSrc:      SpO2:   95%   Weight:      Height:        Intake/Output Summary (Last 24 hours) at 03/28/2022 1359 Last data filed at 03/28/2022 0700 Gross per 24 hour  Intake 420 ml  Output --  Net 420 ml   Filed Weights   03/27/22 0340 03/28/22 0358 03/28/22 0513  Weight: 92 kg 68.8 kg 93 kg    Examination:  General exam: Obese chronically ill female laying in bed, AAOx3, no distress HEENT: No JVD CVS: S1-S2, regular rhythm Lungs: Decreased breath sounds bases Abdomen: Soft, nontender, bowel sounds present Extremities: 1+ edema, hyperpigmentation, demarcated discoloration on dorsum of left foot Psychiatry:  Mood & affect appropriate.     Data Reviewed:   CBC: Recent Labs  Lab 03/26/22 1030 03/27/22 0149  WBC 7.3 5.8  HGB 12.3 11.8*  HCT 38.0 35.0*  MCV 76.2* 73.7*  PLT 197 198   Basic Metabolic Panel: Recent Labs  Lab 03/26/22 1030 03/26/22 1851 03/27/22 0149 03/28/22 0639  NA 140  --  140 141  K 2.7*  --  3.0* 3.3*  CL 100  --  101 101  CO2 27  --  28 30  GLUCOSE 107*  --  99 97  BUN 10  --  10 10   CREATININE 1.13*  --  1.10* 1.15*  CALCIUM 8.8*  --  8.6* 8.7*  MG  --  1.5*  --   --    GFR: Estimated Creatinine Clearance: 50.4 mL/min (A) (by C-G formula based on SCr of 1.15 mg/dL (H)). Liver Function Tests: Recent Labs  Lab 03/26/22 1030  AST 17  ALT 11  ALKPHOS 82  BILITOT 1.6*  PROT 6.0*  ALBUMIN 2.1*   No results for input(s): "LIPASE", "AMYLASE" in the last 168 hours. No results for input(s): "AMMONIA" in the last 168 hours. Coagulation Profile: No results for input(s): "INR", "PROTIME" in the last 168 hours. Cardiac Enzymes: No results for input(s): "CKTOTAL", "CKMB", "CKMBINDEX", "TROPONINI" in the last 168 hours. BNP (last 3 results) No results for input(s): "PROBNP" in the last 8760 hours. HbA1C: Recent Labs    03/26/22 1851  HGBA1C 5.1   CBG: Recent Labs  Lab 03/27/22 1118 03/27/22 1634 03/28/22 0606 03/28/22 0719 03/28/22 1114  GLUCAP 96 104* 103* 98 99   Lipid Profile: No results for input(s): "CHOL", "HDL", "LDLCALC", "TRIG", "CHOLHDL", "LDLDIRECT" in the last 72 hours. Thyroid Function Tests: Recent Labs    03/26/22 1851  TSH 5.683*   Anemia Panel: No results for input(s): "VITAMINB12", "FOLATE", "FERRITIN", "TIBC", "IRON", "RETICCTPCT" in the last 72 hours. Urine analysis:    Component Value Date/Time   COLORURINE YELLOW 03/26/2022 1023   APPEARANCEUR CLEAR 03/26/2022 1023   LABSPEC 1.006 03/26/2022 1023   PHURINE 6.0 03/26/2022 1023   GLUCOSEU NEGATIVE 03/26/2022 1023   HGBUR SMALL (A) 03/26/2022 1023   BILIRUBINUR NEGATIVE 03/26/2022 1023   KETONESUR NEGATIVE 03/26/2022 1023   PROTEINUR NEGATIVE 03/26/2022 1023   NITRITE NEGATIVE 03/26/2022 1023   LEUKOCYTESUR LARGE (A) 03/26/2022 1023   Sepsis Labs: @LABRCNTIP (procalcitonin:4,lacticidven:4)  )No results found for this or any previous visit (from the past 240 hour(s)).   Radiology Studies: DG Foot Complete Left  Result Date: 03/26/2022 CLINICAL DATA:  Swelling. EXAM:  LEFT FOOT - COMPLETE 3+ VIEW COMPARISON:  None Available. FINDINGS: There is no evidence of fracture or dislocation. No radiographic evidence of osteomyelitis. Marked soft tissue swelling about the left foot. IMPRESSION: 1. No radiographic evidence of osteomyelitis. 2. Marked soft tissue swelling about the left foot. Electronically Signed   By: 05/27/2022 M.D.   On: 03/26/2022 15:41   DG Foot Complete Right  Result Date: 03/26/2022 CLINICAL DATA:  Foot ulceration EXAM: RIGHT FOOT COMPLETE - 3+ VIEW COMPARISON:  None Available. FINDINGS: There is no evidence of fracture or dislocation. There is no evidence of arthropathy or other focal bone abnormality. Diffuse soft tissue edema about the foot and ankle. IMPRESSION: No fracture or dislocation of the right foot. Diffuse soft tissue edema about the foot and ankle. Electronically Signed   By: 05/27/2022 M.D.   On: 03/26/2022 15:40     Scheduled Meds:  amiodarone  100 mg Oral Daily   apixaban  5 mg Oral BID   feeding supplement  237 mL Oral BID BM  furosemide  40 mg Intravenous Q12H   insulin aspart  0-9 Units Subcutaneous TID WC   levothyroxine  25 mcg Oral Q0600   metoprolol succinate  25 mg Oral Daily   rosuvastatin  5 mg Oral Daily   sodium chloride flush  3 mL Intravenous Q12H   Continuous Infusions:  sodium chloride       LOS: 2 days    Time spent:    Zannie Cove, MD [facility]   03/28/2022, 1:59 PM

## 2022-03-28 NOTE — Progress Notes (Signed)
  Echocardiogram 2D Echocardiogram has been performed.  Augustine Radar 03/28/2022, 1:50 PM

## 2022-03-28 NOTE — Evaluation (Signed)
Occupational Therapy Evaluation Patient Details Name: Ashley [name] MRN: [id number] DOB: 13-Apr-1946 Today's Date: 03/28/2022   History of Present Illness 76 yo admitted 7/8 with edema and CHF with noted sacral wound. PMhx: CHF, CAD, GERD, hypothyroidism, DVT, AFib, HTN, DM, FTT   Clinical Impression   Pt PTA: pt living at home with son with aid 2 hrs daily for ADL and iADL needs. Pt currently, maxA to totalA for ADL tasks. Pt modA to maxA for bed mobility tolerating <1 mins at EOB before leaning back onto bed. Pt totalA for scooting to [facility]. Pt very limited by fatigue, weakness and poor activity tolerance. Pt requires increased assist for ADL and mobility. Pt sitting EOB holding onto rail and tolerating < before wanting to lay back down. BPs were low 78/62, 87/62. RN in room. HR at EOB 113-148 BPM with exertion. Pt would benefit from continued OT skilled services. OT following acutely.     Recommendations for follow up therapy are one component of a multi-disciplinary discharge planning process, led by the attending physician.  Recommendations may be updated based on patient status, additional functional criteria and insurance authorization.   Follow Up Recommendations  Skilled nursing-short term rehab (<3 hours/day)    Assistance Recommended at Discharge Frequent or constant Supervision/Assistance  Patient can return home with the following A lot of help with walking and/or transfers;A lot of help with bathing/dressing/bathroom;Assistance with feeding;Assist for transportation;Help with stairs or ramp for entrance    Functional Status Assessment  Patient has had a recent decline in their functional status and demonstrates the ability to make significant improvements in function in a reasonable and predictable amount of time.  Equipment Recommendations  BSC/3in1    Recommendations for Other Services       Precautions / Restrictions Precautions Precautions: Fall;Other  (comment) Precaution Comments: sacral wound, watch HR Restrictions Weight Bearing Restrictions: No      Mobility Bed Mobility Overal bed mobility: Needs Assistance Bed Mobility: Rolling, Supine to Sit, Sit to Supine Rolling: Min assist   Supine to sit: Mod assist Sit to supine: Max assist   General bed mobility comments: Pt sitting EOB holding onto rail and tolerating < before wanting to lay back down. BPs were low 78/62, 87/62. RN in room.    Transfers                   General transfer comment: poor ability to sit; standing not attempted      Balance Overall balance assessment: Needs assistance Sitting-balance support: Bilateral upper extremity supported, Feet supported Sitting balance-Leahy Scale: Poor Sitting balance - Comments: requiring bed rail                                   ADL either performed or assessed with clinical judgement   ADL Overall ADL's : Needs assistance/impaired Eating/Feeding: Maximal assistance;Bed level   Grooming: Maximal assistance;Bed level   Upper Body Bathing: Maximal assistance;Bed level   Lower Body Bathing: Total assistance;Bed level   Upper Body Dressing : Maximal assistance;Bed level   Lower Body Dressing: Total assistance;Bed level   Toilet Transfer: Total assistance;+2 for physical assistance;+2 for safety/equipment Toilet Transfer Details (indicate cue type and reason): sitting EOB only today         Functional mobility during ADLs: Maximal assistance General ADL Comments: Pt very limited by fatigue, weakness and poor activity tolerance. Pt requires increased assist for  ADL and mobility.     Vision Baseline Vision/History: 0 No visual deficits Ability to See in Adequate Light: 0 Adequate Patient Visual Report: No change from baseline Vision Assessment?: No apparent visual deficits     Perception     Praxis      Pertinent Vitals/Pain Pain Assessment Pain Assessment: 0-10 Pain  Score: 4  Pain Location: BLEs Pain Descriptors / Indicators: Sore Pain Intervention(s): Monitored during session     Hand Dominance Right   Extremity/Trunk Assessment Upper Extremity Assessment Upper Extremity Assessment: Generalized weakness   Lower Extremity Assessment Lower Extremity Assessment: Generalized weakness   Cervical / Trunk Assessment Cervical / Trunk Assessment: Other exceptions Cervical / Trunk Exceptions: increased body habitus with rounded shoulders   Communication Communication Communication: No difficulties   Cognition Arousal/Alertness: Awake/alert Behavior During Therapy: Flat affect Overall Cognitive Status: Impaired/Different from baseline Area of Impairment: Memory, Orientation, Attention                 Orientation Level: Disoriented to, Time, Situation, Place Current Attention Level: Focused Memory: Decreased short-term memory         General Comments: Pt continues to defer talking about what her son wants versus what she wants. Pt has very little insight into deficits. Pt tangential at times and requires cues for sustained attention     General Comments  BP low at EOB and returned to supine 78/62; 87/62; HR at EOB 113-148 BPM with exertion.    Exercises     Shoulder Instructions      Home Living Family/patient expects to be discharged to:: Private residence Living Arrangements: Children Available Help at Discharge: Available PRN/intermittently Type of Home: House Home Access: Level entry     Home Layout: One level     Bathroom Shower/Tub: Producer, television/film/video: Handicapped height     Home Equipment: Grab bars - toilet;Rollator (4 wheels);Cane - single point;Shower seat;Hand held shower head;Grab bars - tub/shower   Additional Comments: bed with adjustable HOB. Son reports house is too small for [facility], Hoyer lift or hospital bed even if rooms repurposed (ie bedroom moved to living room). Aid 2 hrs/day and son  works so pt alone majority of day      Prior Functioning/Environment Prior Level of Function : Needs assist             Mobility Comments: has been bed bound since Jan due to fear of falling per son and niece. Pt stating she got OOB to a car and wC with sliding transfer (never occured per son) ADLs Comments: total assist for bathing/dressing at bed level. Family and aid doing homemaking        OT Problem List: Decreased strength;Decreased activity tolerance;Impaired balance (sitting and/or standing);Decreased safety awareness;Pain;Increased edema      OT Treatment/Interventions: Self-care/ADL training;Neuromuscular education;Energy conservation;DME and/or AE instruction;Therapeutic activities;Cognitive remediation/compensation;Patient/family education;Balance training    OT Goals(Current goals can be found in the care plan section) Acute Rehab OT Goals Patient Stated Goal: to go home with extra caregivers OT Goal Formulation: With patient Time For Goal Achievement: 04/11/22 Potential to Achieve Goals: Good ADL Goals Pt Will Perform Grooming: with min guard assist;sitting Pt Will Transfer to Toilet: with max assist;bedside commode;stand pivot transfer Additional ADL Goal #1: Pt will increase to x10 mins of functional tasks related to ADl and mobility to increase activity tolerance.  OT Frequency: Min 2X/week    Co-evaluation  AM-PAC OT "6 Clicks" Daily Activity     Outcome Measure Help from another person eating meals?: A Lot Help from another person taking care of personal grooming?: A Lot Help from another person toileting, which includes using toliet, bedpan, or urinal?: Total Help from another person bathing (including washing, rinsing, drying)?: A Lot Help from another person to put on and taking off regular upper body clothing?: A Lot Help from another person to put on and taking off regular lower body clothing?: Total 6 Click Score: 10   End of  Session Nurse Communication: Mobility status;Other (comment) (low BP)  Activity Tolerance: Patient tolerated treatment well Patient left: in bed;with call bell/phone within reach;with bed alarm set;with nursing/sitter in room  OT Visit Diagnosis: Unsteadiness on feet (R26.81);Muscle weakness (generalized) (M62.81)                Time: 5852-7782 OT Time Calculation (min): 26 min Charges:  OT General Charges $OT Visit: 1 Visit OT Evaluation $OT Eval Moderate Complexity: 1 Mod OT Treatments $Self Care/Home Management : 8-22 mins  Flora Lipps, OTR/L Acute Rehabilitation Services Office: [phone]   Lonzo Cloud 03/28/2022, 1:21 PM

## 2022-03-28 NOTE — Progress Notes (Signed)
Heart Failure Navigator Progress Note ? ?Assessed for Heart & Vascular TOC clinic readiness.  ?Patient does not meet criteria due to a [facility] patient. .  ? ? ? ?Lior Hoen, BSN, RN ?Heart Failure Nurse Navigator ?Secure Chat Only   ?

## 2022-03-28 NOTE — Progress Notes (Signed)
Initial Nutrition Assessment  DOCUMENTATION CODES:   Obesity unspecified  INTERVENTION:  Liberalize diet from a carb modified to a regular to provide widest variety of menu options to enhance nutritional adequacy Once meal intake improves, consider transitioning diet to 2 gram sodium given h/o CHF Ensure Enlive po BID, each supplement provides 350 kcal and 20 grams of protein. 30 ml ProSource Plus BID, each supplement provides 100 kcals and 15 grams protein.  MVI with minerals daily  NUTRITION DIAGNOSIS:   Inadequate oral intake related to decreased appetite as evidenced by per patient/family report, meal completion < 50%.  GOAL:   Patient will meet greater than or equal to 90% of their needs  MONITOR:   PO intake, Supplement acceptance, Diet advancement, Labs, Weight trends, I & O's  REASON FOR ASSESSMENT:   Consult Wound healing (Stage 2 pressure injury to sacrum)  ASSESSMENT:   Pt from home with leg swelling and ambulatory dysfunction, found to have acute on chronic CHF. PMH significant for CVA, T2DM, CAD, systolic CHF, hx of PE, PAD on Eliquis, hypothyroidism and GERD.  Spoke with pt at bedside. Her son and granddaughter present during visit. Pt reports that her intake is similar to her intake at home. She reports having a caregiver that comes to her house at 5:30a; this is when she has coffee and sometimes toast with jelly and a banana. Her caregiver usually prepares something for lunch before she leaves which is usually something small. Around 3:30p she may have a tuna or pimento cheese sandwich. Her son states that her intake has decreased within the last 6 months as she has not been feeling well. Pt denies difficulty chewing or swallowing foods.  Meal completions: 07/09: 25%-breakfast, 25%-dinner 07/10: 25%-breakfast  Pt endorses wt loss. Her son states that she has lost about 100 lbs within the last 6 months.   Reviewed wt history. Pt's wt appears to be -27.2 kg  since 07/02/21. Unable to determine true dry wt loss as pt with h/o CHF. It's likely edema may be masking addition muscle losses.  Will continue to monitor throughout admission.   Edema: deep pitting BLE  Pt's blood sugar has been well managed throughout admission. Pt is not likely meeting or exceeding nutrient limits given inadequate PO intake. She would benefit from a liberalized diet until PO intake begins to increase then can consider adjusting to a 2 gram sodium diet d/t CHF.   Medications: lasix, SSI 0-9 units TID, synthroid  Labs: potassium 3.3 (replacing), Cr 1.15, GFR 50, HgbA1c 5.1%, CBG's 82-138 x24 hours  UOP: x12 hours  I/O's: - since admission   NUTRITION - FOCUSED PHYSICAL EXAM:  Flowsheet Row Most Recent Value  Orbital Region No depletion  Upper Arm Region No depletion  Thoracic and Lumbar Region No depletion  Buccal Region Mild depletion  Temple Region Moderate depletion  Clavicle Bone Region No depletion  Clavicle and Acromion Bone Region No depletion  Scapular Bone Region No depletion  Dorsal Hand No depletion  Patellar Region No depletion  Anterior Thigh Region No depletion  Posterior Calf Region No depletion  Edema (RD Assessment) None  Hair Reviewed  Eyes Reviewed  Mouth Reviewed  Skin Reviewed  Nails Reviewed      Diet Order:   Diet Order             Diet regular Room service appropriate? Yes; Fluid consistency: Thin; Fluid restriction: 1500 mL Fluid  Diet effective now  EDUCATION NEEDS:   Education needs have been addressed  Skin:  Skin Assessment: Skin Integrity Issues: Skin Integrity Issues:: Stage II Stage II: coccyx  Last BM:  7/8  Height:   Ht Readings from Last 1 Encounters:  03/26/22 5\' 8"  (1.727 m)    Weight:   Wt Readings from Last 1 Encounters:  03/28/22 93 kg    Ideal Body Weight:  63.6 kg  BMI:  Body mass index is 31.17 kg/m.  Estimated Nutritional Needs:   Kcal:   1700-1900  Protein:  85-100g  Fluid:  >/=1.7L  05/29/22, RDN, LDN Clinical Nutrition

## 2022-03-29 ENCOUNTER — Inpatient Hospital Stay (HOSPITAL_COMMUNITY): Payer: Medicare Other

## 2022-03-29 ENCOUNTER — Other Ambulatory Visit (HOSPITAL_COMMUNITY): Payer: Self-pay

## 2022-03-29 DIAGNOSIS — R52 Pain, unspecified: Secondary | ICD-10-CM | POA: Diagnosis not present

## 2022-03-29 DIAGNOSIS — I5023 Acute on chronic systolic (congestive) heart failure: Secondary | ICD-10-CM | POA: Diagnosis not present

## 2022-03-29 LAB — BASIC METABOLIC PANEL
Anion gap: 15 (ref 5–15)
Anion gap: 9 (ref 5–15)
BUN: 13 mg/dL (ref 8–23)
BUN: 11 mg/dL (ref 8–23)
CO2: 22 mmol/L (ref 22–32)
CO2: 32 mmol/L (ref 22–32)
Calcium: 8.5 mg/dL — ABNORMAL LOW (ref 8.9–10.3)
Calcium: 8.8 mg/dL — ABNORMAL LOW (ref 8.9–10.3)
Chloride: 101 mmol/L (ref 98–111)
Chloride: 98 mmol/L (ref 98–111)
Creatinine, Ser: 1.04 mg/dL — ABNORMAL HIGH (ref 0.44–1.00)
Creatinine, Ser: 1.12 mg/dL — ABNORMAL HIGH (ref 0.44–1.00)
GFR, Estimated: 56 mL/min — ABNORMAL LOW (ref >60)
GFR, Estimated: 51 mL/min — ABNORMAL LOW (ref >60)
Glucose, Bld: 86 mg/dL (ref 70–99)
Glucose, Bld: 112 mg/dL — ABNORMAL HIGH (ref 70–99)
Potassium: 6.2 mmol/L — ABNORMAL HIGH (ref 3.5–5.1)
Potassium: 3.5 mmol/L (ref 3.5–5.1)
Sodium: 138 mmol/L (ref 135–145)
Sodium: 139 mmol/L (ref 135–145)

## 2022-03-29 LAB — GLUCOSE, CAPILLARY
Glucose-Capillary: 103 mg/dL — ABNORMAL HIGH (ref 70–99)
Glucose-Capillary: 106 mg/dL — ABNORMAL HIGH (ref 70–99)
Glucose-Capillary: 116 mg/dL — ABNORMAL HIGH (ref 70–99)
Glucose-Capillary: 122 mg/dL — ABNORMAL HIGH (ref 70–99)
Glucose-Capillary: 82 mg/dL (ref 70–99)

## 2022-03-29 LAB — MAGNESIUM: Magnesium: 1.6 mg/dL — ABNORMAL LOW (ref 1.7–2.4)

## 2022-03-29 MED ORDER — FUROSEMIDE 20 MG PO TABS
20.0000 mg | ORAL_TABLET | Freq: Every day | ORAL | Status: DC
  Administered 2022-03-31 – 2022-04-05 (×6): 20 mg via ORAL
  Filled 2022-03-29 (×8): qty 1

## 2022-03-29 MED ORDER — AMIODARONE HCL 200 MG PO TABS
200.0000 mg | ORAL_TABLET | Freq: Three times a day (TID) | ORAL | Status: DC
  Administered 2022-03-29 – 2022-03-30 (×5): 200 mg via ORAL
  Filled 2022-03-29 (×5): qty 1

## 2022-03-29 MED ORDER — SILDENAFIL CITRATE 20 MG PO TABS
20.0000 mg | ORAL_TABLET | Freq: Three times a day (TID) | ORAL | Status: DC
Start: 2022-03-29 — End: 2022-04-05
  Administered 2022-03-29 – 2022-04-05 (×19): 20 mg via ORAL
  Filled 2022-03-29 (×21): qty 1

## 2022-03-29 MED ORDER — SODIUM ZIRCONIUM CYCLOSILICATE 5 G PO PACK
5.0000 g | PACK | Freq: Once | ORAL | Status: AC
  Administered 2022-03-29: 5 g via ORAL
  Filled 2022-03-29: qty 1

## 2022-03-29 MED ORDER — METOPROLOL SUCCINATE ER 50 MG PO TB24
50.0000 mg | ORAL_TABLET | Freq: Every day | ORAL | Status: DC
  Administered 2022-03-29: 50 mg via ORAL
  Filled 2022-03-29 (×2): qty 1

## 2022-03-29 MED ORDER — MAGNESIUM SULFATE 2 GM/50ML IV SOLN
2.0000 g | Freq: Once | INTRAVENOUS | Status: AC
  Administered 2022-03-29: 2 g via INTRAVENOUS
  Filled 2022-03-29: qty 50

## 2022-03-29 NOTE — Progress Notes (Signed)
Patient is asymptomatic with soft blood pressures. On call provider notified. We will repeat manual blood pressure and monitor.

## 2022-03-29 NOTE — Progress Notes (Signed)
Heart Failure Patient Advocate Encounter   Received notification from [facility] Medicare that prior authorization for Revatio (sildenafil) is required.   PA submitted on CoverMyMeds Key [id number] Status is pending    Copay check ran for [facility]. Copay $0 per month.   Sharen Hones, PharmD, BCPS Heart Failure Stewardship Pharmacist Phone [phone]  Please check [email or web address] for unit-specific pharmacist phone numbers

## 2022-03-29 NOTE — Progress Notes (Signed)
PROGRESS NOTE    Ashley [name]  NID:[id number] DOB: [birth year] DOA: 03/26/2022 PCP: Merri Brunette, MD  75/M with history of chronic systolic CHF, CVA, type 2 diabetes mellitus, history of massive PE and DVT in 10/22, A-fib on Eliquis,  debility, bedbound and nonambulatory since January 2023 who was brought to the ED on account of worsening leg swelling, she denied any shortness of breath cough or orthopnea, lives at home, her son works, has a caregiver couple of hours a day.  In the ED vitals were stable, chest x-ray noted cardiac enlargement, moderate right pleural effusion, left foot noted soft tissue swelling. -Improving with diuretics -Echo with severe RV dysfunction and moderate PAH   Subjective: -Feels okay overall, swelling is improving breathing better, PureWick has been leaking, urine output inaccurate  Assessment and Plan:  Acute on chronic systolic CHF Biventricular failure, severe RV failure 76 year old with history of systolic heart failure and RHF secondary to PE in 06/2021 who presented to ED with worsening leg swelling and decline in ADL function found to be in acute on chronic systolic CHF exacerbation based off elevated BNP>1000, findings of new moderate right pleural effusion, increased LE edema.  -Clinically improving with diuretics, remains on IV Lasix, her PureWick has been leaking for couple of days, urine output has been unreliable -Echo with severe RV dysfunction and moderate PAH -Hold Aldactone today, potassium has trended up to 6.3 -request cardiology input  Pleural effusion, right -Likely related to CHF, monitor with diuresis  Left foot discoloration -Both feet are warmer today, ABIs pending  AKI (acute kidney injury) (HCC) -Mild, cardiorenal resolved  Hyperkalemia -Secondary to supplemental potassium and Aldactone, Lokelma today, hold Aldactone  Hypokalemia -Resolved  History of pulmonary embolus (HCC) Hx of submassive PE and chronic DVT in  06/2021 Continue eliquis   Paroxysmal atrial fibrillation (HCC) Continue amiodarone and toprol-xl -Heart rate elevated today, will increase Toprol dose Continue eliquis  Controlled diabetes mellitus type II without complication (HCC) A1C in 2022 was 6.2, update now Diet controlled Continue carb modified diet, SSI and accuchecks QAC/HS  Essential hypertension Continue toprol-xl  History of stroke Continue eliquis/statin  Coronary artery disease Recent LHC with no obstructive disease Continue statin/eliquis  Hypothyroidism -Recently started on Synthroid, will hold off on titrating dose up at this time  Gastroesophageal reflux disease Continue pepcid PRN, no longer on chronic PPI therapy   Pressure ulcer Secondary to being bed bound -Wound care  DVT prophylaxis: Eliquis Code Status: DNR, discussed with patient again today Family Communication: Discussed patient in detail, no family at bedside Disposition Plan: Declines SNF although she is bedbound  Consultants: Cardiology   Procedures:   Antimicrobials:    Objective: Vitals:   03/29/22 0400 03/29/22 0500 03/29/22 0600 03/29/22 0710  BP: 93/70 108/78 119/81 109/74  Pulse:    100  Resp: 13 13 16 19   Temp:    (!) 97.4 F (36.3 C)  TempSrc:    Oral  SpO2:    95%  Weight:      Height:        Intake/Output Summary (Last 24 hours) at 03/29/2022 1206 Last data filed at 03/29/2022 0856 Gross per 24 hour  Intake 480 ml  Output 1550 ml  Net -1070 ml   Filed Weights   03/28/22 0358 03/28/22 0513 03/29/22 0032  Weight: 68.8 kg 93 kg 89.2 kg    Examination:  General exam: Obese chronically ill female laying in bed, AAOx3, no distress HEENT: Positive JVD CVS:  S1-S2, irregular rhythm Lungs: Decreased breath sounds at the bases otherwise clear Abdomen: Soft, nontender, bowel sounds present Extremities: Trace edema, hyperpigmentation, demarcated discoloration on dorsum of left foot Psychiatry:  Mood & affect  appropriate.     Data Reviewed:   CBC: Recent Labs  Lab 03/26/22 1030 03/27/22 0149  WBC 7.3 5.8  HGB 12.3 11.8*  HCT 38.0 35.0*  MCV 76.2* 73.7*  PLT 197 198   Basic Metabolic Panel: Recent Labs  Lab 03/26/22 1030 03/26/22 1851 03/27/22 0149 03/28/22 0639 03/29/22 0430  NA 140  --  140 141 138  K 2.7*  --  3.0* 3.3* 6.2*  CL 100  --  101 101 101  CO2 27  --  28 30 22   GLUCOSE 107*  --  99 97 86  BUN 10  --  10 10 13   CREATININE 1.13*  --  1.10* 1.15* 1.04*  CALCIUM 8.8*  --  8.6* 8.7* 8.5*  MG  --  1.5*  --   --   --    GFR: Estimated Creatinine Clearance: 54.6 mL/min (A) (by C-G formula based on SCr of 1.04 mg/dL (H)). Liver Function Tests: Recent Labs  Lab 03/26/22 1030  AST 17  ALT 11  ALKPHOS 82  BILITOT 1.6*  PROT 6.0*  ALBUMIN 2.1*   No results for input(s): "LIPASE", "AMYLASE" in the last 168 hours. No results for input(s): "AMMONIA" in the last 168 hours. Coagulation Profile: No results for input(s): "INR", "PROTIME" in the last 168 hours. Cardiac Enzymes: No results for input(s): "CKTOTAL", "CKMB", "CKMBINDEX", "TROPONINI" in the last 168 hours. BNP (last 3 results) No results for input(s): "PROBNP" in the last 8760 hours. HbA1C: Recent Labs    03/26/22 1851  HGBA1C 5.1   CBG: Recent Labs  Lab 03/28/22 1605 03/28/22 2108 03/29/22 0036 03/29/22 0551 03/29/22 1033  GLUCAP 131* 113* 116* 103* 122*   Lipid Profile: No results for input(s): "CHOL", "HDL", "LDLCALC", "TRIG", "CHOLHDL", "LDLDIRECT" in the last 72 hours. Thyroid Function Tests: Recent Labs    03/26/22 1851  TSH 5.683*   Anemia Panel: No results for input(s): "VITAMINB12", "FOLATE", "FERRITIN", "TIBC", "IRON", "RETICCTPCT" in the last 72 hours. Urine analysis:    Component Value Date/Time   COLORURINE YELLOW 03/26/2022 1023   APPEARANCEUR CLEAR 03/26/2022 1023   LABSPEC 1.006 03/26/2022 1023   PHURINE 6.0 03/26/2022 1023   GLUCOSEU NEGATIVE 03/26/2022 1023    HGBUR SMALL (A) 03/26/2022 1023   BILIRUBINUR NEGATIVE 03/26/2022 1023   KETONESUR NEGATIVE 03/26/2022 1023   PROTEINUR NEGATIVE 03/26/2022 1023   NITRITE NEGATIVE 03/26/2022 1023   LEUKOCYTESUR LARGE (A) 03/26/2022 1023   Sepsis Labs: @LABRCNTIP (procalcitonin:4,lacticidven:4)  )No results found for this or any previous visit (from the past 240 hour(s)).   Radiology Studies: ECHOCARDIOGRAM COMPLETE  Result Date: 03/28/2022    ECHOCARDIOGRAM REPORT   Patient Name:   Ashley [name] [facility] Date of Exam: 03/28/2022 Medical Rec #:  Luz Brazen            Height:       68.0 in Accession #:    [facility]           Weight:       205.0 lb Date of Birth:  August 07, 1946            BSA:          2.065 m Patient Age:    75 years             BP:  99/81 mmHg Patient Gender: F                    HR:           90 bpm. Exam Location:  Inpatient Procedure: 2D Echo, Cardiac Doppler and Color Doppler Indications:     CHF-Acute Systolic I50.21  History:         Patient has prior history of Echocardiogram examinations, most                  recent 06/22/2021. Stroke; Risk Factors:Hypertension.  Sonographer:     Eulah Pont RDCS Referring Phys:  [id number] Orland Mustard Diagnosing Phys: Epifanio Lesches MD IMPRESSIONS  1. Left ventricular ejection fraction, by estimation, is 40 to 45%. The left ventricle has mildly decreased function. The left ventricle demonstrates regional wall motion abnormalities. Septal hypokinesis. There is moderate left ventricular hypertrophy.  Left ventricular diastolic parameters are indeterminate. There is the interventricular septum is flattened in systole and diastole, consistent with right ventricular pressure and volume overload.  2. Right ventricular systolic function is severely reduced. The right ventricular size is moderately enlarged. There is moderately elevated pulmonary artery systolic pressure. The estimated right ventricular systolic pressure is 45.5 mmHg.  3. Left atrial size  was severely dilated.  4. Right atrial size was mildly dilated.  5. A small pericardial effusion is present.  6. The mitral valve is normal in structure. Mild mitral valve regurgitation. No evidence of mitral stenosis.  7. Tricuspid valve regurgitation is moderate.  8. The aortic valve was not well visualized. Aortic valve regurgitation is trivial. No aortic stenosis is present.  9. Aortic dilatation noted. There is mild dilatation of the ascending aorta, measuring 38 mm. 10. The inferior vena cava is normal in size with greater than 50% respiratory variability, suggesting right atrial pressure of 3 mmHg. FINDINGS  Left Ventricle: Left ventricular ejection fraction, by estimation, is 40 to 45%. The left ventricle has mildly decreased function. The left ventricle demonstrates regional wall motion abnormalities. The left ventricular internal cavity size was normal in size. There is moderate left ventricular hypertrophy. The interventricular septum is flattened in systole and diastole, consistent with right ventricular pressure and volume overload. Left ventricular diastolic parameters are indeterminate. Right Ventricle: The right ventricular size is moderately enlarged. Right vetricular wall thickness was not well visualized. Right ventricular systolic function is severely reduced. There is moderately elevated pulmonary artery systolic pressure. The tricuspid regurgitant velocity is 3.26 m/s, and with an assumed right atrial pressure of 3 mmHg, the estimated right ventricular systolic pressure is 45.5 mmHg. Left Atrium: Left atrial size was severely dilated. Right Atrium: Right atrial size was mildly dilated. Pericardium: A small pericardial effusion is present. Mitral Valve: The mitral valve is normal in structure. Mild mitral valve regurgitation. No evidence of mitral valve stenosis. Tricuspid Valve: The tricuspid valve is normal in structure. Tricuspid valve regurgitation is moderate. Aortic Valve: The aortic valve  was not well visualized. Aortic valve regurgitation is trivial. No aortic stenosis is present. Pulmonic Valve: The pulmonic valve was not well visualized. Pulmonic valve regurgitation is not visualized. Aorta: The aortic root is normal in size and structure and aortic dilatation noted. There is mild dilatation of the ascending aorta, measuring 38 mm. Venous: The inferior vena cava is normal in size with greater than 50% respiratory variability, suggesting right atrial pressure of 3 mmHg. IAS/Shunts: The interatrial septum was not well visualized.  LEFT VENTRICLE PLAX 2D LVIDd:  4.20 cm LVIDs:         3.70 cm LV PW:         1.40 cm LV IVS:        1.20 cm LVOT diam:     2.00 cm LV SV:         26 LV SV Index:   13 LVOT Area:     3.14 cm  LV Volumes (MOD) LV vol d, MOD A2C: 59.3 ml LV vol d, MOD A4C: 63.4 ml LV vol s, MOD A2C: 37.6 ml LV vol s, MOD A4C: 38.6 ml LV SV MOD A2C:     21.7 ml LV SV MOD A4C:     63.4 ml LV SV MOD BP:      26.3 ml RIGHT VENTRICLE RV S prime:     7.95 cm/s TAPSE (M-mode): 1.5 cm LEFT ATRIUM              Index        RIGHT ATRIUM           Index LA diam:        3.30 cm  1.60 cm/m   RA Area:     25.20 cm LA Vol (A2C):   132.0 ml 63.91 ml/m  RA Volume:   74.90 ml  36.26 ml/m LA Vol (A4C):   176.0 ml 85.21 ml/m LA Biplane Vol: 155.0 ml 75.05 ml/m  AORTIC VALVE LVOT Vmax:   62.17 cm/s LVOT Vmean:  39.000 cm/s LVOT VTI:    0.082 m  AORTA Ao Root diam: 3.90 cm Ao Asc diam:  3.80 cm MR Peak grad: 68.6 mmHg   TRICUSPID VALVE MR Mean grad: 40.0 mmHg   TR Peak grad:   42.5 mmHg MR Vmax:      414.00 cm/s TR Vmax:        326.00 cm/s MR Vmean:     302.0 cm/s                           SHUNTS                           Systemic VTI:  0.08 m                           Systemic Diam: 2.00 cm Epifanio Lesches MD Electronically signed by Epifanio Lesches MD Signature Date/Time: 03/28/2022/3:19:31 PM    Final (Updated)    DG Chest 2 View  Result Date: 03/28/2022 CLINICAL DATA:  Pleural  effusion EXAM: CHEST - 2 VIEW COMPARISON:  Chest x-ray dated March 26, 2022 FINDINGS: Unchanged enlarged cardiac and mediastinal contours. Stable moderate loculated right pleural effusion. No evidence of left pleural effusion. No evidence of pneumothorax. IMPRESSION: 1. Stable moderate loculated right pleural effusion. 2. Cardiomegaly. Electronically Signed   By: Allegra Lai M.D.   On: 03/28/2022 15:03     Scheduled Meds:  (feeding supplement) PROSource Plus  30 mL Oral BID BM   amiodarone  100 mg Oral Daily   apixaban  5 mg Oral BID   feeding supplement  237 mL Oral BID BM   furosemide  40 mg Intravenous Q12H   insulin aspart  0-9 Units Subcutaneous TID WC   levothyroxine  25 mcg Oral Q0600   metoprolol succinate  50 mg Oral Daily   multivitamin with minerals  1 tablet Oral Daily  rosuvastatin  5 mg Oral Daily   sodium chloride flush  3 mL Intravenous Q12H   Continuous Infusions:  sodium chloride       LOS: 3 days    Time spent: 35min    Zannie CovePreetha Branch Pacitti, MD [facility]   03/29/2022, 12:06 PM

## 2022-03-29 NOTE — Care Management Important Message (Signed)
Important Message  Patient Details  Name: Ashley [name] MRN: [id number] Date of Birth: Dec 09, 1945   Medicare Important Message Given:  Yes     Zeya Balles Stefan Church 03/29/2022, 3:25 PM

## 2022-03-29 NOTE — Progress Notes (Signed)
ABI exam has been completed.   Results can be found under chart review under CV PROC. 03/29/2022 4:59 PM Leylany Nored RVT, RDMS

## 2022-03-29 NOTE — Consult Note (Signed)
CARDIOLOGY CONSULT NOTE  Patient ID: Ashley [name] MRN: [id number] DOB/AGE: 76-03-1946 76 y.o.  Admit date: 03/26/2022 Referring Physician  Melene Plan, MD Primary Physician:  Merri Brunette, MD Reason for Consultation  CHF  Patient ID: Ashley [name], female    DOB: Feb 20, 1946, 76 y.o.   MRN: [id number]  CC: Shortness of breath and edema HPI:    Ashley [name]  is a 76 y.o. 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  She is presently at home and was discharged from [facility] in January 2023.  Patient states that she is presently feeling well and has noticed marked improvement in overall wellbeing and also leg edema is essentially resolved.  She is now able to lay down flat and feels much more energetic and dyspnea has resolved as well.  No chest pain, no palpitations.  Past Medical History:  Diagnosis Date   Essential hypertension 11/23/2018   Obesity 11/23/2018   Stroke [facility])    Past Surgical History:  Procedure Laterality Date   CARDIOVERSION N/A 06/21/2021   Procedure: CARDIOVERSION;  Surgeon: Elder Negus, MD;  Location: [facility] ENDOSCOPY;  Service: Cardiovascular;  Laterality: N/A;   LEFT HEART CATH AND CORONARY ANGIOGRAPHY N/A 07/29/2021   Procedure: LEFT HEART CATH AND CORONARY ANGIOGRAPHY;  Surgeon: Elder Negus, MD;  Location: [facility] INVASIVE CV LAB;  Service: Cardiovascular;  Laterality: N/A;   TEE WITHOUT CARDIOVERSION N/A 06/21/2021   Procedure: TRANSESOPHAGEAL ECHOCARDIOGRAM (TEE);  Surgeon: Elder Negus, MD;  Location: [facility] ENDOSCOPY;  Service: Cardiovascular;   Laterality: N/A;   Social History   Tobacco Use   Smoking status: Never    Passive exposure: Never   Smokeless tobacco: Never  Substance Use Topics   Alcohol use: No    Family History  Problem Relation Age of Onset   Stroke Mother    Hypertension Mother     Marital Status: Single  ROS  Review of Systems  Cardiovascular:  Positive for dyspnea on exertion and leg swelling. Negative for chest pain.   Objective      03/29/2022    7:10 AM 03/29/2022    6:00 AM 03/29/2022    5:00 AM  Vitals with BMI  Systolic 109 119 294  Diastolic 74 81 78  Pulse 100      Blood pressure 109/74, pulse 100, temperature (!) 97.4 F (36.3 C), temperature source Oral, resp. rate 19, height 5\' 8"  (1.727 m), weight 89.2 kg, SpO2 95 %. Body mass index is 29.9 kg/m.  Physical Exam Neck:     Vascular: No JVD.  Cardiovascular:     Rate and Rhythm: Tachycardia present. Rhythm irregular.     Pulses: Normal pulses and intact distal pulses.     Heart sounds: No murmur heard.    No gallop. No S3 or S4 sounds.  Pulmonary:     Effort: Pulmonary effort is normal.     Breath sounds: Normal breath sounds.  Abdominal:     General: Bowel sounds are normal.     Palpations: Abdomen is soft.  Musculoskeletal:     Right lower leg: No edema.     Left lower leg: No edema.  Skin:    Capillary  Refill: Capillary refill takes less than 2 seconds.    Laboratory examination:   Recent Labs    03/27/22 0149 03/28/22 0639 03/29/22 0430  NA 140 141 138  K 3.0* 3.3* 6.2*  CL 101 101 101  CO2 28 30 22   GLUCOSE 99 97 86  BUN 10 10 13   CREATININE 1.10* 1.15* 1.04*  CALCIUM 8.6* 8.7* 8.5*  GFRNONAA 52* 50* 56*   estimated creatinine clearance is 54.6 mL/min (A) (by C-G formula based on SCr of 1.04 mg/dL (H)).     Latest Ref Rng & Units 03/29/2022    4:30 AM 03/28/2022    6:39 AM 03/27/2022    1:49 AM  CMP  Glucose 70 - 99 mg/dL 86  97  99   BUN 8 - 23 mg/dL 13  10  10    Creatinine 0.44 - 1.00 mg/dL 05/29/2022   05/28/2022    Sodium 135 - 145 mmol/L 138  141  140   Potassium 3.5 - 5.1 mmol/L 6.2  3.3  3.0   Chloride 98 - 111 mmol/L 101  101  101   CO2 22 - 32 mmol/L 22  30  28    Calcium 8.9 - 10.3 mg/dL 8.5  8.7  8.6       Latest Ref Rng & Units 03/27/2022    1:49 AM 03/26/2022   10:30 AM 07/31/2021    5:22 AM  CBC  WBC 4.0 - 10.5 K/uL 5.8  7.3  6.6   Hemoglobin 12.0 - 15.0 g/dL  05/28/2022  05/27/2022   Hematocrit 36.0 - 46.0 % 35.0  38.0  35.3   Platelets 150 - 400 K/uL 198  197  103    Lipid Panel Recent Labs    06/22/21 0302  TRIG 95    HEMOGLOBIN A1C Lab Results  Component Value Date   HGBA1C 5.1 03/26/2022   MPG 99.67 03/26/2022   TSH Recent Labs    06/19/21 1229 07/29/21 0337 03/26/22 1851  TSH 1.412 1.511 5.683*   BNP (last 3 results) Recent Labs    06/27/21 0441 07/28/21 1231 03/26/22 1030  BNP 1,914.8* 837.6* 1,904.9*   Cardiac Panel (last 3 results) Recent Labs    03/26/22 1442 03/26/22 1851  TROPONINIHS 19* 18*     Medications and allergies   Allergies  Allergen Reactions   Bystolic [Nebivolol Hcl] Other (See Comments)    Bradycardia    Coreg [Carvedilol] Other (See Comments)    bradycardia   Minoxidil Other (See Comments)    Abdominal pain    Current Outpatient Medications  Medication Instructions   acetaminophen (TYLENOL) 500 mg, Oral, Daily PRN   amiodarone (PACERONE) 100 mg, Oral, Daily   Eliquis 5 mg, Oral, 2 times daily   famotidine (PEPCID) 20 mg, Oral, At bedtime PRN   furosemide (LASIX) 20 MG tablet Take 1 tablet (20 mg total) by mouth daily as needed for excessive swelling.   levothyroxine (SYNTHROID) 25 MCG tablet Take 1 tablet (25 mcg total) by mouth in the morning on an empty stomach   losartan (COZAAR) 12.5 mg, Oral, Daily   metoprolol succinate (TOPROL-XL) 25 mg, Oral, Daily   mirtazapine (REMERON) 15 mg, Oral, Daily at bedtime   potassium chloride (KLOR-CON) 10 MEQ tablet Take 1 tablet (10 mEq total) by mouth daily with food    potassium chloride SA (KLOR-CON M) 20 MEQ tablet Take 2 tablets by mouth today Thursday 5.4.23 and then take 1 tablet daily starting Friday 5.5.23  rosuvastatin (CRESTOR) 5 mg, Oral, Daily   spironolactone (ALDACTONE) 25 mg, Oral, Daily   Scheduled Meds:  (feeding supplement) PROSource Plus  30 mL Oral BID BM   amiodarone  100 mg Oral Daily   apixaban  5 mg Oral BID   feeding supplement  237 mL Oral BID BM   furosemide  40 mg Intravenous Q12H   insulin aspart  0-9 Units Subcutaneous TID WC   levothyroxine  25 mcg Oral Q0600   metoprolol succinate  50 mg Oral Daily   multivitamin with minerals  1 tablet Oral Daily   rosuvastatin  5 mg Oral Daily   sodium chloride flush  3 mL Intravenous Q12H   Continuous Infusions:  sodium chloride     PRN Meds:.sodium chloride, acetaminophen **OR** acetaminophen, famotidine, sodium chloride flush   I/O last 3 completed shifts: In: 720 [P.O.:720] Out: 600 [Urine:600] Total I/O In: 417 [P.O.:417] Out: 650 [Urine:650]   BNP (last 3 results) Recent Labs    06/27/21 0441 07/28/21 1231 03/26/22 1030  BNP 1,914.8* 837.6* 1,904.9*   Radiology:   DG Chest 2 View  Date: 03/28/2022 CLINICAL DATA:  Pleural effusion EXAM: CHEST - 2 VIEW COMPARISON:  Chest x-ray dated March 26, 2022 FINDINGS: Unchanged enlarged cardiac and mediastinal contours. Stable moderate loculated right pleural effusion. No evidence of left pleural effusion. No evidence of pneumothorax. IMPRESSION: 1. Stable moderate loculated right pleural effusion. 2. Cardiomegaly. Electronically Signed   By: Allegra Lai M.D.   On: 03/28/2022 15:03     Cardiac Studies:   Left heart catheterization: 07/29/2021: Right dominant circulation Tortuous vessels with no significant coronary artery disease Normal LVEDP  Echocardiogram 07/010/2023:   1. Left ventricular ejection fraction, by estimation, is 40 to 45%. The left ventricle has mildly decreased function. The left ventricle  demonstrates regional wall motion abnormalities. Septal hypokinesis. There is moderate left ventricular hypertrophy.  Left ventricular diastolic parameters are indeterminate. There is the interventricular septum is flattened in systole and diastole, consistent with right ventricular pressure and volume overload.  2. Right ventricular systolic function is severely reduced. The right ventricular size is moderately enlarged. There is moderately elevated pulmonary artery systolic pressure. The estimated right ventricular systolic pressure is 45.5 mmHg.  3. Left atrial size was severely dilated.  4. Right atrial size was mildly dilated.  5. A small pericardial effusion is present.  6. The mitral valve is normal in structure. Mild mitral valve regurgitation. No evidence of mitral stenosis.  7. Tricuspid valve regurgitation is moderate.  8. The aortic valve was not well visualized. Aortic valve regurgitation is trivial. No aortic stenosis is present.  9. Aortic dilatation noted. There is mild dilatation of the ascending aorta, measuring 38 mm. 10. The inferior vena cava is normal in size with greater than 50% respiratory variability, suggesting right atrial pressure of 3 mmHg.  EKG:  EKG 03/26/2022: Atypical atrial flutter with variable AV conduction, ventricular rate 110 bpm.  Incomplete right bundle branch block.  Marked T wave abnormality, cannot exclude lateral ischemia.  Compared to 07/30/2021, sinus rhythm has been replaced.  T abnormality is new.  Assessment   Ashley [name]  is a 76 y.o. African-American female patient with previous morbid obesity, multiple medical comorbidity and failure to thrive in an adult, essentially bedbound, with history of hypertension, right temporal ischemic stroke in July 2021, submassive pulmonary embolism in September 2022, chronic cor pulmonale with severe RV systolic dysfunction, persistent atrial fibrillation is now admitted with generalized anasarca and  failure to thrive.  She is presently at home and was discharged from [facility] in January 2023. 1.  Acute on chronic cor pulmonale with decompensated RV heart failure secondary to submassive pulmonary embolism in September 2022. 2.  Primary hypertension 3.  Morbid obesity now resolved. 4.  History of stroke with significant amount of deconditioning and mild right lower extremity weakness 5.  Persistent atypical atrial flutter with RVR  Recommendations:   Patient is now very well diuresed.  Leg edema has essentially resolved, she has very minimal JVD and no hepatomegaly.  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  We will start her on Entresto with close follow-up and monitoring of her renal function if she tolerates sildenafil 20 mg 3 times daily.  She would not do well with Farxiga in view of right ventricle being preload dependent.  We will avoid hypotension.  Blood pressure is soft.  I discussed with the patient regarding mobility, patient has been pretty much bedbound and is afraid to get up due to risk of fall.  I have encouraged her to use a walker.  We will have physical therapy and Occupational Therapy to assist with this during hospitalization.  With regard to persistent atypical atrial flutter, we increased amiodarone dose to 200 mg twice daily for now again.  If she persistent a flutter with RVR, we could again consider cardioversion however likelihood of maintaining sinus rhythm would be low in view of severe RV systolic dysfunction and acute on chronic, pulmonale.  We will continue to follow with you closely.  Discontinue IV Lasix today but will transition to oral Lasix tomorrow.   Yates Decamp, MD, [facility] 03/29/2022, 8:54 AM Office: [phone]

## 2022-03-29 NOTE — Consult Note (Signed)
   [facility] Inpatient Consult   03/29/2022  Kindle Strohmeier 07/21/1946 [id number]  [place] [facility] [ACO] Patient: [employer]  Referral:  Progression  Primary Care Provider:  [facility]  confirmed today with Gwinda Passe at [facility] and with son, Addie Alonge by phone on 03/28/22 with inpatient TOC RNCM.  Patient screened for hospitalization with discussion the past two days in the unit progression meeting for post hospital barriers to care for needs.  Review of patient's medical record reveals patient is being recommended for a skilled nursing facility level of care however she is declining. Please see inpatient RNCM notes for details.  1:05 pm Met with patient at the bedside to discuss post hospital spoke with patient at length regarding post hospital barriers. Reviewed SDOH, and she denies any issues at this time. The patient tells this Probation officer she has nieces and her son that can assist.  She states that, "I am not ready to give up my freedom to be in a long term facility, I was almost in that situation for 9 to 10 months at [facility]. Listen, I am not trying to ruin my son's life I want him to be able to come and go as he should.  If he wants to move out and get his own, I would be the first person to start the brick."  We talked about possibilities with returning home without 24 hour support. She states, "lets be real who really has that [laughingly]." Patient states, "nobody knows how they will leave this earth but while I have a choice of being able to make plans that's what I want to do."  Patient does endorse [facility] [[facility] as her primary care and that they come out and do home visits and get her lab work.  She said her son told her the aides were not coming back [[facility]]. "I just need to plan and talk with the people I have in mind to help me." Gave patient an appointment reminder card  with 24 hour nurse advise line number on it.  Plan:  Continue to follow progress and disposition to assess for post hospital care management needs.  Patient feels she has everything in terms with follow up she needs with [facility] [[facility]]. States, "I don't mind the follow up there's a lot of help you can get with video [virtual/telephonic] follow up.  Will plan to assign for post hospital telephonic RNCM for transition with [facility].  For questions contact:   Natividad Brood, RN BSN [facility] Liaison  [phone] business mobile phone Toll free office [phone]  Fax number: [phone] [email or web address]

## 2022-03-29 NOTE — Plan of Care (Signed)
  Problem: Coping: Goal: Ability to adjust to condition or change in health will improve Outcome: Progressing   Problem: Health Behavior/Discharge Planning: Goal: Ability to identify and utilize available resources and services will improve Outcome: Progressing Goal: Ability to manage health-related needs will improve Outcome: Progressing   

## 2022-03-30 ENCOUNTER — Other Ambulatory Visit (HOSPITAL_COMMUNITY): Payer: Self-pay

## 2022-03-30 DIAGNOSIS — I2609 Other pulmonary embolism with acute cor pulmonale: Secondary | ICD-10-CM

## 2022-03-30 DIAGNOSIS — I5023 Acute on chronic systolic (congestive) heart failure: Secondary | ICD-10-CM | POA: Diagnosis not present

## 2022-03-30 DIAGNOSIS — J90 Pleural effusion, not elsewhere classified: Secondary | ICD-10-CM

## 2022-03-30 DIAGNOSIS — I2782 Chronic pulmonary embolism: Secondary | ICD-10-CM

## 2022-03-30 DIAGNOSIS — I480 Paroxysmal atrial fibrillation: Secondary | ICD-10-CM | POA: Diagnosis not present

## 2022-03-30 DIAGNOSIS — N179 Acute kidney failure, unspecified: Secondary | ICD-10-CM

## 2022-03-30 DIAGNOSIS — Z8673 Personal history of transient ischemic attack (TIA), and cerebral infarction without residual deficits: Secondary | ICD-10-CM

## 2022-03-30 LAB — BASIC METABOLIC PANEL
Anion gap: 8 (ref 5–15)
BUN: 13 mg/dL (ref 8–23)
CO2: 30 mmol/L (ref 22–32)
Calcium: 8.5 mg/dL — ABNORMAL LOW (ref 8.9–10.3)
Chloride: 100 mmol/L (ref 98–111)
Creatinine, Ser: 1.01 mg/dL — ABNORMAL HIGH (ref 0.44–1.00)
GFR, Estimated: 58 mL/min — ABNORMAL LOW (ref >60)
Glucose, Bld: 86 mg/dL (ref 70–99)
Potassium: 3.5 mmol/L (ref 3.5–5.1)
Sodium: 138 mmol/L (ref 135–145)

## 2022-03-30 LAB — GLUCOSE, CAPILLARY
Glucose-Capillary: 85 mg/dL (ref 70–99)
Glucose-Capillary: 118 mg/dL — ABNORMAL HIGH (ref 70–99)
Glucose-Capillary: 151 mg/dL — ABNORMAL HIGH (ref 70–99)
Glucose-Capillary: 73 mg/dL (ref 70–99)

## 2022-03-30 LAB — PROTIME-INR
INR: 2.6 — ABNORMAL HIGH (ref 0.8–1.2)
Prothrombin Time: 27.5 seconds — ABNORMAL HIGH (ref 11.4–15.2)

## 2022-03-30 LAB — MAGNESIUM: Magnesium: 2.1 mg/dL (ref 1.7–2.4)

## 2022-03-30 MED ORDER — POTASSIUM CHLORIDE CRYS ER 20 MEQ PO TBCR
40.0000 meq | EXTENDED_RELEASE_TABLET | Freq: Once | ORAL | Status: AC
  Administered 2022-03-30: 40 meq via ORAL
  Filled 2022-03-30: qty 2

## 2022-03-30 MED ORDER — SODIUM CHLORIDE 0.9 % IV SOLN: INTRAVENOUS | Status: DC

## 2022-03-30 NOTE — H&P (View-Only) (Signed)
Subjective:  States that she is feeling better.  She did not have any difficulty breathing last night, no PND or orthopnea.  States that her leg edema has completely resolved.  Intake/Output from previous day:  I/O last 3 completed shifts: In: 237 [P.O.:237] Out: 2350 [Urine:2350] No intake/output data recorded. Net IO Since Admission: -2,903 mL [03/30/22 0728]  Blood pressure 109/78, pulse 78, temperature 98.1 F (36.7 C), temperature source Oral, resp. rate 16, height _0  (1.727 m), weight 88.1 kg, SpO2 100 %.  Physical Exam Constitutional:      Appearance: She is ill-appearing.  Neck:     Vascular: No JVD.  Cardiovascular:     Rate and Rhythm: Normal rate and regular rhythm.     Pulses:          Dorsalis pedis pulses are 1+ on the right side and 1+ on the left side.       Posterior tibial pulses are 0 on the right side and 0 on the left side.     Heart sounds: Normal heart sounds. No murmur heard.    No gallop.  Pulmonary:     Effort: Pulmonary effort is normal.     Breath sounds: Normal breath sounds.  Abdominal:     General: Bowel sounds are normal.     Palpations: Abdomen is soft.  Musculoskeletal:     Right lower leg: No edema (Large adipose tissue present, shoulder and shrunken lower extremity.).     Left lower leg: No edema (Large adipose tissue present, shoulder and shrunken lower extremity.).     Lab Results: BMP BNP (last 3 results) Recent Labs    06/27/21 0441 07/28/21 1231 03/26/22 1030  BNP 1,914.8* 837.6* 1,904.9*    ProBNP (last 3 results) No results for input(s): "PROBNP" in the last 8760 hours.    Latest Ref Rng & Units 03/30/2022    1:18 AM 03/29/2022   12:26 PM 03/29/2022    4:30 AM  BMP  Glucose 70 - 99 mg/dL 86  112  86   BUN 8 - 23 mg/dL _1 Creatinine 0.44 - 1.00 mg/dL 1.01  1.12  1.04   Sodium 135 - 145 mmol/L 138  139  138   Potassium 3.5 - 5.1 mmol/L 3.5  3.5  6.2   Chloride 98 - 111 mmol/L 100  98  101   CO2 22 - 32  mmol/L 30  32  22   Calcium 8.9 - 10.3 mg/dL 8.5  8.8  8.5       Latest Ref Rng & Units 03/26/2022   10:30 AM 07/29/2021    3:37 AM 07/28/2021    9:40 PM  Hepatic Function  Total Protein 6.5 - 8.1 g/dL 6.0  6.2  6.4   Albumin 3.5 - 5.0 g/dL 2.1  2.5  2.7   AST 15 - 41 U/L 17  60  61   ALT 0 - 44 U/L _2 Alk Phosphatase 38 - 126 U/L 82  67  68   Total Bilirubin 0.3 - 1.2 mg/dL 1.6  0.9  1.3   Bilirubin, Direct 0.0 - 0.2 mg/dL 0.5         Latest Ref Rng & Units 03/27/2022    1:49 AM 03/26/2022   10:30 AM 07/31/2021    5:22 AM  CBC  WBC 4.0 - 10.5 K/uL 5.8  7.3  6.6   Hemoglobin 12.0 - 15.0 g/dL 11.8  12.3  11.3   Hematocrit 36.0 - 46.0 % 35.0  38.0  35.3   Platelets 150 - 400 K/uL 198  197  103    Lipid Panel     Component Value Date/Time   CHOL 163 04/05/2020 0334   TRIG 95 06/22/2021 0302   HDL 63 04/05/2020 0334   CHOLHDL 2.6 04/05/2020 0334   VLDL 13 04/05/2020 0334   LDLCALC 87 04/05/2020 0334   Cardiac Panel (last 3 results) No results for input(s): "CKTOTAL", "CKMB", "TROPONINI", "RELINDX" in the last 72 hours.  HEMOGLOBIN A1C Lab Results  Component Value Date   HGBA1C 5.1 03/26/2022   MPG 99.67 03/26/2022   TSH Recent Labs    06/19/21 1229 07/29/21 0337 03/26/22 1851  TSH 1.412 1.511 5.683*    Current Facility-Administered Medications:    (feeding supplement) PROSource Plus liquid 30 mL, 30 mL, Oral, BID BM, Domenic Polite, MD, 30 mL at 03/30/22 1531   0.9 %  sodium chloride infusion, 250 mL, Intravenous, PRN, Orma Flaming, MD   acetaminophen (TYLENOL) tablet 650 mg, 650 mg, Oral, Q6H PRN **OR** acetaminophen (TYLENOL) suppository 650 mg, 650 mg, Rectal, Q6H PRN, Orma Flaming, MD   amiodarone (PACERONE) tablet 200 mg, 200 mg, Oral, TID, Adrian Prows, MD, 200 mg at 03/30/22 1530   apixaban (ELIQUIS) tablet 5 mg, 5 mg, Oral, BID, Orma Flaming, MD, 5 mg at 03/30/22 1055   famotidine (PEPCID) tablet 20 mg, 20 mg, Oral, QHS PRN, Orma Flaming,  MD   feeding supplement (ENSURE ENLIVE / ENSURE PLUS) liquid 237 mL, 237 mL, Oral, BID BM, Orma Flaming, MD, 237 mL at 03/30/22 1530   furosemide (LASIX) tablet 20 mg, 20 mg, Oral, Daily, Adrian Prows, MD   insulin aspart (novoLOG) injection 0-9 Units, 0-9 Units, Subcutaneous, TID WC, Orma Flaming, MD, 2 Units at 03/30/22 1630   levothyroxine (SYNTHROID) tablet 25 mcg, 25 mcg, Oral, Q0600, Orma Flaming, MD, 25 mcg at 03/30/22 0714   metoprolol succinate (TOPROL-XL) 24 hr tablet 50 mg, 50 mg, Oral, Daily, Arrien, Jimmy Picket, MD, 50 mg at 03/29/22 6546   multivitamin with minerals tablet 1 tablet, 1 tablet, Oral, Daily, Domenic Polite, MD, 1 tablet at 03/30/22 1054   rosuvastatin (CRESTOR) tablet 5 mg, 5 mg, Oral, Daily, Orma Flaming, MD, 5 mg at 03/30/22 1054   sildenafil (REVATIO) tablet 20 mg, 20 mg, Oral, TID, Adrian Prows, MD, 20 mg at 03/30/22 1530   sodium chloride flush (NS) 0.9 % injection 3 mL, 3 mL, Intravenous, Q12H, Orma Flaming, MD, 3 mL at 03/30/22 1057   sodium chloride flush (NS) 0.9 % injection 3 mL, 3 mL, Intravenous, PRN, Orma Flaming, MD  Radiology:    DG Chest 2 View  Date: 03/28/2022 CLINICAL DATA:  Pleural effusion EXAM: CHEST - 2 VIEW COMPARISON:  Chest x-ray dated March 26, 2022 FINDINGS: Unchanged enlarged cardiac and mediastinal contours. Stable moderate loculated right pleural effusion. No evidence of left pleural effusion. No evidence of pneumothorax. IMPRESSION: 1. Stable moderate loculated right pleural effusion. 2. Cardiomegaly. Electronically Signed   By: Yetta Glassman M.D.   On: 03/28/2022 15:03     Cardiac Studies:    Left heart catheterization: 07/29/2021: Right dominant circulation Tortuous vessels with no significant coronary artery disease Normal LVEDP  ABI 03/29/2022: Right: Resting right ankle-brachial index is within normal range (1.0). No  evidence of significant right lower extremity arterial disease.   Left: Resting left  ankle-brachial index is within normal range 1.09). No  evidence  of significant left lower extremity arterial disease.    Echocardiogram 07/010/2023:   1. Left ventricular ejection fraction, by estimation, is 40 to 45%. The left ventricle has mildly decreased function. The left ventricle demonstrates regional wall motion abnormalities. Septal hypokinesis. There is moderate left ventricular hypertrophy.  Left ventricular diastolic parameters are indeterminate. There is the interventricular septum is flattened in systole and diastole, consistent with right ventricular pressure and volume overload.  2. Right ventricular systolic function is severely reduced. The right ventricular size is moderately enlarged. There is moderately elevated pulmonary artery systolic pressure. The estimated right ventricular systolic pressure is 45.5 mmHg.  3. Left atrial size was severely dilated.  4. Right atrial size was mildly dilated.  5. A small pericardial effusion is present.  6. The mitral valve is normal in structure. Mild mitral valve regurgitation. No evidence of mitral stenosis.  7. Tricuspid valve regurgitation is moderate.  8. The aortic valve was not well visualized. Aortic valve regurgitation is trivial. No aortic stenosis is present.  9. Aortic dilatation noted. There is mild dilatation of the ascending aorta, measuring 38 mm. 10. The inferior vena cava is normal in size with greater than 50% respiratory variability, suggesting right atrial pressure of 3 mmHg.   EKG:   EKG 03/26/2022: Atypical atrial flutter with variable AV conduction, ventricular rate 110 bpm.  Incomplete right bundle branch block.  Marked T wave abnormality, cannot exclude lateral ischemia.  Compared to 07/30/2021, sinus rhythm has been replaced.  T abnormality is new.   Assessment    Julius Murph  is a 75 y.o. 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  She is presently at home and was discharged from [facility] in January 2023. 1.  Acute on chronic cor pulmonale with decompensated RV heart failure secondary to submassive pulmonary embolism in September 2022. 2.  Primary hypertension 3.  Morbid obesity now resolved. 4.  History of stroke with significant amount of deconditioning and mild right lower extremity weakness 5.  Persistent atypical atrial flutter with RVR   Recommendations:    Patient's fluid status is improved, she is not in acute decompensated heart failure although she came in with acute biventricular failure right worse than the left. She is tolerating sildenafil, continue the same for now.  Blood pressure is soft however serum creatinine has improved and patient is feeling well.  Heart rate control is also improved.  She continues to be in persistent atrial flutter, atypical.  In view of acute on chronic systolic heart failure, she has lost weight from morbid obesity to mild obesity now, and has been on amiodarone, hence would have a higher chance of maintaining sinus rhythm, will schedule her for direct-current cardioversion in the morning.  Her son is present at the bedside, I have discussed the risks and benefits including cardiac arrest, need for temporary stabilization with intubation as she is DNR, chest wall trauma due to CPR discussed.  Patient states that she is very comfortable with me and would like to proceed.  I did not make any changes to her medications today.  Again discussed with her regarding increasing her physical activity to improve her leg strength.  Would recommend continued   PT OT evaluation while  in-house.  Suspect she will need placement again.  Although pedal pulses are very faint I suspect this is due to chronic lipedema and chronic venous stasis changes, ABI within normal limits with triphasic waveforms.     Adrian Prows, MD, [facility] 03/30/2022, 7:28 AM Office: [phone] Fax: [phone] Pager: [phone]

## 2022-03-30 NOTE — Progress Notes (Signed)
Physical Therapy Treatment Patient Details Name: Ashley [name] MRN: [id number] DOB: 30-Apr-1946 Today's Date: 03/30/2022   History of Present Illness 76 yo admitted 7/8 with edema and CHF with noted sacral wound. PMhx: CHF, CAD, GERD, hypothyroidism, DVT, AFib, HTN, DM, FTT    PT Comments    Pt limited by fatigue this session, tolerating brief periods of upright sitting prior to leaning into sidelying or posteriorly onto bed. Pt is generally weak and requires physical assistance for all functional mobility. PT continues to recommend use of mechanical lift for out of bed mobility due to weakness and high falls risl. PT continues to recommend long term institutional care as the pt's home is not suitable for equipment needed to safely mobilize within the home.   Recommendations for follow up therapy are one component of a multi-disciplinary discharge planning process, led by the attending physician.  Recommendations may be updated based on patient status, additional functional criteria and insurance authorization.  Follow Up Recommendations  Long-term institutional care without follow-up therapy Can patient physically be transported by private vehicle: No   Assistance Recommended at Discharge Frequent or constant Supervision/Assistance  Patient can return home with the following Two people to help with walking and/or transfers;A lot of help with bathing/dressing/bathroom;Assistance with feeding;Assistance with cooking/housework;Direct supervision/assist for financial management;Direct supervision/assist for medications management   Equipment Recommendations  Hospital bed;Wheelchair (measurements PT);Other (comment) (per chart, son reports house is too small small for wheelchair/lift/hospital bed)    Recommendations for Other Services       Precautions / Restrictions Precautions Precautions: Fall;Other (comment) Precaution Comments: sacral wound, watch HR Restrictions Weight Bearing  Restrictions: No     Mobility  Bed Mobility Overal bed mobility: Needs Assistance Bed Mobility: Supine to Sit, Sit to Supine     Supine to sit: Mod assist, HOB elevated Sit to supine: Max assist   General bed mobility comments: PT assist for LLE as well as use of bed pad to pivot hips, hand hold to allow pt to pull trunk into upright position    Transfers Overall transfer level:  (deferred due to pt reports of fatigue, difficulty maintaining upright positioning)                      Ambulation/Gait                   Stairs             Wheelchair Mobility    Modified Rankin (Stroke Patients Only)       Balance Overall balance assessment: Needs assistance Sitting-balance support: Single extremity supported, Bilateral upper extremity supported, Feet supported Sitting balance-Leahy Scale: Poor Sitting balance - Comments: intermittent lateral and posterior loss of balance, sometimes intentional due to fatigue. Pt tolerates brief ~60 second periods of upright sitting Postural control: Right lateral lean, Posterior lean                                  Cognition Arousal/Alertness: Awake/alert Behavior During Therapy: WFL for tasks assessed/performed Overall Cognitive Status: Impaired/Different from baseline Area of Impairment: Memory, Problem solving                     Memory: Decreased short-term memory       Problem Solving: Slow processing          Exercises General Exercises - Lower Extremity Gluteal Sets: AROM, Both, 10  reps Short Arc Quad: AROM, Both, 15 reps Heel Slides: AAROM, Both, 5 reps Hip ABduction/ADduction: AROM, Both, 15 reps    General Comments General comments (skin integrity, edema, etc.): BP low, 86/64. HR labile during session as pt in afib, maxHR observed in 130s      Pertinent Vitals/Pain Pain Assessment Pain Assessment: No/denies pain    Home Living                           Prior Function            PT Goals (current goals can now be found in the care plan section) Acute Rehab PT Goals Patient Stated Goal: pt- return home, son- get pt help to move Progress towards PT goals: Not progressing toward goals - comment    Frequency    Min 2X/week      PT Plan Current plan remains appropriate    Co-evaluation              AM-PAC PT "6 Clicks" Mobility   Outcome Measure  Help needed turning from your back to your side while in a flat bed without using bedrails?: A Little Help needed moving from lying on your back to sitting on the side of a flat bed without using bedrails?: A Lot Help needed moving to and from a bed to a chair (including a wheelchair)?: Total Help needed standing up from a chair using your arms (e.g., wheelchair or bedside chair)?: Total Help needed to walk in hospital room?: Total Help needed climbing 3-5 steps with a railing? : Total 6 Click Score: 9    End of Session   Activity Tolerance: Patient limited by fatigue Patient left: in bed;with call bell/phone within reach;with bed alarm set Nurse Communication: Mobility status;Need for lift equipment PT Visit Diagnosis: Other abnormalities of gait and mobility (R26.89);Muscle weakness (generalized) (M62.81);Difficulty in walking, not elsewhere classified (R26.2)     Time: 5284-1324 PT Time Calculation (min) (ACUTE ONLY): 28 min  Charges:  $Therapeutic Exercise: 8-22 mins $Therapeutic Activity: 8-22 mins                     Arlyss Gandy, PT, DPT Acute Rehabilitation Office [phone]    Arlyss Gandy 03/30/2022, 2:41 PM

## 2022-03-30 NOTE — Assessment & Plan Note (Addendum)
Echocardiogram with LV systolic function reduced to 40 to 45%, septal hypokinesis. Moderate LVH, interventricular  Septum is flattened in systolic and diastole. Severe reduction in RV systolic function. RVSP 45.5 mmHg. LA severe dilatation. Small pericardial effusion. Moderate TR.   Acute on chronic core pulmonale. Pulmonary hypertension.   Patient had diuresis with furosemide, negative fluid balance was achieved with improvement in her symptoms. At the time of her discharge with no dyspnea and improved lower extremity edema.   Continue midodrine for blood pressure support.  Continue diuresis with furosemide.   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  Continue with sildenafil.  Bilateral unna boots with improvement of peripheral edema.  Anticoagulation with apixaban.  Patient with very poor mobility.

## 2022-03-30 NOTE — Anesthesia Preprocedure Evaluation (Signed)
Anesthesia Evaluation  Patient identified by MRN, date of birth, ID band Patient awake    Reviewed: Allergy & Precautions, NPO status , Patient's Chart, lab work & pertinent test results  Airway Mallampati: II  TM Distance: >3 FB Neck ROM: Full    Dental no notable dental hx. (+) Poor Dentition, Missing, Dental Advisory Given   Pulmonary neg pulmonary ROS,    Pulmonary exam normal breath sounds clear to auscultation       Cardiovascular hypertension, Pt. on medications and Pt. on home beta blockers + CAD, + Past MI and +CHF  negative cardio ROS Normal cardiovascular exam+ dysrhythmias Atrial Fibrillation  Rhythm:Regular Rate:Normal  ECHO 7/23 1. Left ventricular ejection fraction, by estimation, is 40 to 45%. The  left ventricle has mildly decreased function. The left ventricle  demonstrates regional wall motion abnormalities. Septal hypokinesis. There  is moderate left ventricular hypertrophy.    Neuro/Psych CVA negative neurological ROS  negative psych ROS   GI/Hepatic negative GI ROS, Neg liver ROS,   Endo/Other  negative endocrine ROSdiabetesHypothyroidism Morbid obesity  Renal/GU ARFRenal diseasenegative Renal ROS  negative genitourinary   Musculoskeletal negative musculoskeletal ROS (+)   Abdominal (+) + obese,   Peds negative pediatric ROS (+)  Hematology negative hematology ROS (+) HLD   Anesthesia Other Findings A-fib with RVR  Reproductive/Obstetrics negative OB ROS                           Anesthesia Physical  Anesthesia Plan  ASA: 4  Anesthesia Plan: General   Post-op Pain Management: Minimal or no pain anticipated   Induction: Intravenous  PONV Risk Score and Plan: 3 and Propofol infusion and Treatment may vary due to age or medical condition  Airway Management Planned: Nasal Cannula and Simple Face Mask  Additional Equipment:   Intra-op Plan:   Post-operative  Plan:   Informed Consent: I have reviewed the patients History and Physical, chart, labs and discussed the procedure including the risks, benefits and alternatives for the proposed anesthesia with the patient or authorized representative who has indicated his/her understanding and acceptance.     Dental advisory given  Plan Discussed with: CRNA and Anesthesiologist  Anesthesia Plan Comments:         Anesthesia Quick Evaluation

## 2022-03-30 NOTE — Progress Notes (Signed)
Subjective:  States that she is feeling better.  She did not have any difficulty breathing last night, no PND or orthopnea.  States that her leg edema has completely resolved.  Intake/Output from previous day:  I/O last 3 completed shifts: In: 237 [P.O.:237] Out: 2350 [Urine:2350] No intake/output data recorded. Net IO Since Admission: -2,903 mL [03/30/22 0728]  Blood pressure 109/78, pulse 78, temperature 98.1 F (36.7 C), temperature source Oral, resp. rate 16, height _0  (1.727 m), weight 88.1 kg, SpO2 100 %.  Physical Exam Constitutional:      Appearance: She is ill-appearing.  Neck:     Vascular: No JVD.  Cardiovascular:     Rate and Rhythm: Normal rate and regular rhythm.     Pulses:          Dorsalis pedis pulses are 1+ on the right side and 1+ on the left side.       Posterior tibial pulses are 0 on the right side and 0 on the left side.     Heart sounds: Normal heart sounds. No murmur heard.    No gallop.  Pulmonary:     Effort: Pulmonary effort is normal.     Breath sounds: Normal breath sounds.  Abdominal:     General: Bowel sounds are normal.     Palpations: Abdomen is soft.  Musculoskeletal:     Right lower leg: No edema (Large adipose tissue present, shoulder and shrunken lower extremity.).     Left lower leg: No edema (Large adipose tissue present, shoulder and shrunken lower extremity.).     Lab Results: BMP BNP (last 3 results) Recent Labs    06/27/21 0441 07/28/21 1231 03/26/22 1030  BNP 1,914.8* 837.6* 1,904.9*    ProBNP (last 3 results) No results for input(s): "PROBNP" in the last 8760 hours.    Latest Ref Rng & Units 03/30/2022    1:18 AM 03/29/2022   12:26 PM 03/29/2022    4:30 AM  BMP  Glucose 70 - 99 mg/dL 86  112  86   BUN 8 - 23 mg/dL _1 Creatinine 0.44 - 1.00 mg/dL 1.01  1.12  1.04   Sodium 135 - 145 mmol/L 138  139  138   Potassium 3.5 - 5.1 mmol/L 3.5  3.5  6.2   Chloride 98 - 111 mmol/L 100  98  101   CO2 22 - 32  mmol/L 30  32  22   Calcium 8.9 - 10.3 mg/dL 8.5  8.8  8.5       Latest Ref Rng & Units 03/26/2022   10:30 AM 07/29/2021    3:37 AM 07/28/2021    9:40 PM  Hepatic Function  Total Protein 6.5 - 8.1 g/dL 6.0  6.2  6.4   Albumin 3.5 - 5.0 g/dL 2.1  2.5  2.7   AST 15 - 41 U/L 17  60  61   ALT 0 - 44 U/L _2 Alk Phosphatase 38 - 126 U/L 82  67  68   Total Bilirubin 0.3 - 1.2 mg/dL 1.6  0.9  1.3   Bilirubin, Direct 0.0 - 0.2 mg/dL 0.5         Latest Ref Rng & Units 03/27/2022    1:49 AM 03/26/2022   10:30 AM 07/31/2021    5:22 AM  CBC  WBC 4.0 - 10.5 K/uL 5.8  7.3  6.6   Hemoglobin 12.0 - 15.0 g/dL 11.8  12.3  11.3   Hematocrit 36.0 - 46.0 % 35.0  38.0  35.3   Platelets 150 - 400 K/uL 198  197  103    Lipid Panel     Component Value Date/Time   CHOL 163 04/05/2020 0334   TRIG 95 06/22/2021 0302   HDL 63 04/05/2020 0334   CHOLHDL 2.6 04/05/2020 0334   VLDL 13 04/05/2020 0334   LDLCALC 87 04/05/2020 0334   Cardiac Panel (last 3 results) No results for input(s): "CKTOTAL", "CKMB", "TROPONINI", "RELINDX" in the last 72 hours.  HEMOGLOBIN A1C Lab Results  Component Value Date   HGBA1C 5.1 03/26/2022   MPG 99.67 03/26/2022   TSH Recent Labs    06/19/21 1229 07/29/21 0337 03/26/22 1851  TSH 1.412 1.511 5.683*    Current Facility-Administered Medications:    (feeding supplement) PROSource Plus liquid 30 mL, 30 mL, Oral, BID BM, Domenic Polite, MD, 30 mL at 03/30/22 1531   0.9 %  sodium chloride infusion, 250 mL, Intravenous, PRN, Orma Flaming, MD   acetaminophen (TYLENOL) tablet 650 mg, 650 mg, Oral, Q6H PRN **OR** acetaminophen (TYLENOL) suppository 650 mg, 650 mg, Rectal, Q6H PRN, Orma Flaming, MD   amiodarone (PACERONE) tablet 200 mg, 200 mg, Oral, TID, Adrian Prows, MD, 200 mg at 03/30/22 1530   apixaban (ELIQUIS) tablet 5 mg, 5 mg, Oral, BID, Orma Flaming, MD, 5 mg at 03/30/22 1055   famotidine (PEPCID) tablet 20 mg, 20 mg, Oral, QHS PRN, Orma Flaming,  MD   feeding supplement (ENSURE ENLIVE / ENSURE PLUS) liquid 237 mL, 237 mL, Oral, BID BM, Orma Flaming, MD, 237 mL at 03/30/22 1530   furosemide (LASIX) tablet 20 mg, 20 mg, Oral, Daily, Adrian Prows, MD   insulin aspart (novoLOG) injection 0-9 Units, 0-9 Units, Subcutaneous, TID WC, Orma Flaming, MD, 2 Units at 03/30/22 1630   levothyroxine (SYNTHROID) tablet 25 mcg, 25 mcg, Oral, Q0600, Orma Flaming, MD, 25 mcg at 03/30/22 0714   metoprolol succinate (TOPROL-XL) 24 hr tablet 50 mg, 50 mg, Oral, Daily, Arrien, Jimmy Picket, MD, 50 mg at 03/29/22 1478   multivitamin with minerals tablet 1 tablet, 1 tablet, Oral, Daily, Domenic Polite, MD, 1 tablet at 03/30/22 1054   rosuvastatin (CRESTOR) tablet 5 mg, 5 mg, Oral, Daily, Orma Flaming, MD, 5 mg at 03/30/22 1054   sildenafil (REVATIO) tablet 20 mg, 20 mg, Oral, TID, Adrian Prows, MD, 20 mg at 03/30/22 1530   sodium chloride flush (NS) 0.9 % injection 3 mL, 3 mL, Intravenous, Q12H, Orma Flaming, MD, 3 mL at 03/30/22 1057   sodium chloride flush (NS) 0.9 % injection 3 mL, 3 mL, Intravenous, PRN, Orma Flaming, MD  Radiology:    DG Chest 2 View  Date: 03/28/2022 CLINICAL DATA:  Pleural effusion EXAM: CHEST - 2 VIEW COMPARISON:  Chest x-ray dated March 26, 2022 FINDINGS: Unchanged enlarged cardiac and mediastinal contours. Stable moderate loculated right pleural effusion. No evidence of left pleural effusion. No evidence of pneumothorax. IMPRESSION: 1. Stable moderate loculated right pleural effusion. 2. Cardiomegaly. Electronically Signed   By: Yetta Glassman M.D.   On: 03/28/2022 15:03     Cardiac Studies:    Left heart catheterization: 07/29/2021: Right dominant circulation Tortuous vessels with no significant coronary artery disease Normal LVEDP  ABI 03/29/2022: Right: Resting right ankle-brachial index is within normal range (1.0). No  evidence of significant right lower extremity arterial disease.   Left: Resting left  ankle-brachial index is within normal range 1.09). No  evidence  of significant left lower extremity arterial disease.    Echocardiogram 07/010/2023:   1. Left ventricular ejection fraction, by estimation, is 40 to 45%. The left ventricle has mildly decreased function. The left ventricle demonstrates regional wall motion abnormalities. Septal hypokinesis. There is moderate left ventricular hypertrophy.  Left ventricular diastolic parameters are indeterminate. There is the interventricular septum is flattened in systole and diastole, consistent with right ventricular pressure and volume overload.  2. Right ventricular systolic function is severely reduced. The right ventricular size is moderately enlarged. There is moderately elevated pulmonary artery systolic pressure. The estimated right ventricular systolic pressure is 41.7 mmHg.  3. Left atrial size was severely dilated.  4. Right atrial size was mildly dilated.  5. A small pericardial effusion is present.  6. The mitral valve is normal in structure. Mild mitral valve regurgitation. No evidence of mitral stenosis.  7. Tricuspid valve regurgitation is moderate.  8. The aortic valve was not well visualized. Aortic valve regurgitation is trivial. No aortic stenosis is present.  9. Aortic dilatation noted. There is mild dilatation of the ascending aorta, measuring 38 mm. 10. The inferior vena cava is normal in size with greater than 50% respiratory variability, suggesting right atrial pressure of 3 mmHg.   EKG:   EKG 03/26/2022: Atypical atrial flutter with variable AV conduction, ventricular rate 110 bpm.  Incomplete right bundle branch block.  Marked T wave abnormality, cannot exclude lateral ischemia.  Compared to 07/30/2021, sinus rhythm has been replaced.  T abnormality is new.   Assessment    Ashley [name]  is a 76 y.o. 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  She is presently at home and was discharged from [facility] in January 2023. 1.  Acute on chronic cor pulmonale with decompensated RV heart failure secondary to submassive pulmonary embolism in September 2022. 2.  Primary hypertension 3.  Morbid obesity now resolved. 4.  History of stroke with significant amount of deconditioning and mild right lower extremity weakness 5.  Persistent atypical atrial flutter with RVR   Recommendations:    Patient's fluid status is improved, she is not in acute decompensated heart failure although she came in with acute biventricular failure right worse than the left. She is tolerating sildenafil, continue the same for now.  Blood pressure is soft however serum creatinine has improved and patient is feeling well.  Heart rate control is also improved.  She continues to be in persistent atrial flutter, atypical.  In view of acute on chronic systolic heart failure, she has lost weight from morbid obesity to mild obesity now, and has been on amiodarone, hence would have a higher chance of maintaining sinus rhythm, will schedule her for direct-current cardioversion in the morning.  Her son is present at the bedside, I have discussed the risks and benefits including cardiac arrest, need for temporary stabilization with intubation as she is DNR, chest wall trauma due to CPR discussed.  Patient states that she is very comfortable with me and would like to proceed.  I did not make any changes to her medications today.  Again discussed with her regarding increasing her physical activity to improve her leg strength.  Would recommend continued  PT OT evaluation while  in-house.  Suspect she will need placement again.  Although pedal pulses are very faint I suspect this is due to chronic lipedema and chronic venous stasis changes, ABI within normal limits with triphasic waveforms.     Adrian Prows, MD, [facility] 03/30/2022, 7:28 AM Office: [phone] Fax: [phone] Pager: [phone]

## 2022-03-30 NOTE — TOC Progression Note (Addendum)
Transition of Care [facility]) - Progression Note    Patient Details  Name: Ashley [name] MRN: [id number] Date of Birth: Sep 15, 1946  [facility] CM/SW Contact  Leone Haven, RN Phone Number: 03/30/2022, 4:00 PM  Clinical Narrative:    NCM and CSW went to speak with patient, she is still very adamant about going home, she states her niece Fredderick Phenix [phone] will be assisting her if she needs her, she states she is at a water park today so you will not be able to reach her.  NCM  and CSW spoke with son Iantha Fallen and he states that he will not be at home with her , he is in process of moving.  He stats he told his mother the patient that she needs to go to a 24 hr facility, where she can get care , but she does not want to do that. She is bedbound and can not do anything for herself.  Iantha Fallen states APS was involved in May and they stated she has full capacity and she does not want to go to SNF so there is nothing they can do.  NCM informed patient that this NCM will not be able to set up any [facility] services if she has no one that will be at home with her because the [facility] agency will be liable.  She continues to state that she will have someone to be with her.  Iantha Fallen states that Fredderick Phenix has her own family with two small children and a spouse and will not be able to be with her 24 hours.  Iantha Fallen states that she will keep saying there will be someone there with her but he is telling us that there is not going to be anyone there.  She is bed bound and will need ambulance transport.  This information was given to Dr. Ella Jubilee also.  NCM tried to look into to see if she has Medicaid but she does not have regular Medicaid.  [facility] is her PCP, that will go by her house or do telehealth, they are not a HH agency.       Barriers to Discharge: Continued Medical Work up  Expected Discharge Plan and Services   In-house Referral: Clinical Social Work Discharge Planning Services: CM Consult    Living arrangements for the past 2 months: Single Family Home                                       Social Determinants of Health (SDOH) Interventions    Readmission Risk Interventions     No data to display

## 2022-03-30 NOTE — Hospital Course (Addendum)
Ashley [name] was admitted to the hospital with the working diagnosis of decompensated heart failure.  Hospitalization complicated with atrial fibrillation, and difficulty in discharge planing.   76 yo female with the past medical history of hypertension, CVA, T2DM, Coronary artery disease, pulmonary embolism, hypothyroid and paroxysmal atrial fibrillation who presented with lower extremity edema and ambulatory dysfunction. She was discharged from [facility] 09/2021, requiring assistance to most activities of daily life. On her initial physical examination her blood pressure was 102/61, HR 52, RR 20 and 02 saturation 96% on room air. Heart with S1 and S2 irregularly irregular, lungs with decreased breath sounds more on right lower lobe, abdomen not distended and 3+ pitting bilateral lower extremity edema.   Na 140, K 2,7 CL 100 bicarbonate 27 glucose 100 bun 10 cr 1,13 BNP 1,904  Wbc 7,3 hgb 12,3 plt 197  [place] SG 1,006, 6-10 wbc   CT head no acute changes.   Chest radiograph right rotation, with cardiomegaly, right loculated pleural effusion.   EKG 110 bpm, normal axis, normal qtc, atrial fibrillation rhythm, with no significant ST segment changes, lead II, III, AvF, V4 to V6 T wave inversion.   Patient was placed on furosemide for diuresis.  Echocardiogram with severe RV dysfunction.   07/13 Patient had direct current cardioversion.  07/14 bradycardia amiodarone and metoprolol were held.   Psychiatry consulted as second opinion for competency. Plan for patient to go home, she continue to decline SNF/ rehab.   07/17 with episodic atrial fibrillation with RVR, resumed amiodarone today. Plant to continue telemetry monitoring for 24 hrs if now further arrhythmia plan to discharge home with home health services.

## 2022-03-30 NOTE — Plan of Care (Signed)
  Problem: Coping: Goal: Ability to adjust to condition or change in health will improve Outcome: Progressing   Problem: Health Behavior/Discharge Planning: Goal: Ability to identify and utilize available resources and services will improve Outcome: Progressing   Problem: Health Behavior/Discharge Planning: Goal: Ability to manage health-related needs will improve Outcome: Progressing   

## 2022-03-30 NOTE — Progress Notes (Signed)
Progress Note   Patient: Ashley [name] KNL:[id number] DOB: 09-01-1946 DOA: 03/26/2022     4 DOS: the patient was seen and examined on 03/30/2022   Brief hospital course: Ashley [name] was admitted to the hospital with the working diagnosis of decompensated heart failure.   76 yo female with the past medical history of hypertension, CVA, T2DM, Coronary artery disease, pulmonary embolism, hypothyroid and paroxysmal atrial fibrillation who presented with lower extremity edema and ambulatory dysfunction. She was discharged from [facility] 09/2021, requiring assistance to most activities of daily life. On her initial physical examination her blood pressure was 102/61, HR 52, RR 20 and 02 saturation 96% on room air. Heart with S1 and S2 irregularly irregular, lungs with decreased breath sounds more on right lower lobe, abdomen not distended and 3+ pitting bilateral lower extremity edema.   Na 140, K 2,7 CL 100 bicarbonate 27 glucose 100 bun 10 cr 1,13 BNP 1,904  Wbc 7,3 hgb 12,3 plt 197  [place] SG 1,006, 6-10 wbc   CT head no acute changes.   Chest radiograph right rotation, with cardiomegaly, right loculated pleural effusion.   EKG 110 bpm, normal axis, normal qtc, atrial fibrillation rhythm, with no significant ST segment changes, lead II, III, AvF, V4 to V6 T wave inversion.   Patient was placed on furosemide for diuresis.  Echocardiogram with severe RV dysfunction.    Assessment and Plan: * Acute on chronic systolic (congestive) heart failure (HCC) Echocardiogram with LV systolic function reduced to 40 to 45%, septal hypokinesis. Moderate LVH, interventricular  Septum is flattened in systolic and diastole. Severe reduction in RV systolic function. RVSP 45.5 mmHg. LA severe dilatation. Small pericardial effusion. Moderate TR.   Acute on chronic core pulmonale. Pulmonary hypertension.   Documented urine output is 1,750 ml Systolic blood pressure 68 to 92 mmHg.   Today metoprolol and furosemide  on hold due to hypotension.  Patient with severe pulmonary hypertension and poor prognosis. Will add midodrine for blood pressure support. Holding parameters for metoprolol.  Patient continue with peripheral edema will add unna boots.    AKI (acute kidney injury) (HCC) Hypokalemia. Hypomagnesemia   Renal function with serum cr at 1,0 with K at 3,5 and serum bicarbonate at 30. Mg 2,1   Holding diuresis today because hypotension.   Add Kcl 40 meq today Follow up renal function in am.   Pleural effusion, right Loculated chronic right pleural effusion.  Continue diuresis as tolerated.   Pulmonary embolus (HCC) Hx of submassive PE and chronic DVT in 06/2021 Continue eliquis   Paroxysmal atrial fibrillation (HCC) Patient on amiodarone, metoprolol and anticoagulation with apixaban. Plan for direct current cardioversion.   Controlled diabetes mellitus type II without complication (HCC) A1C in 2022 was 6.2, update now Continue glucose cover and monitoring with insulin sliding scale.  Fasting glucose today is 86   Essential hypertension Continue toprol-xl Hold ARB/spironolactone with AKI and soft readings   History of stroke Continue eliquis/statin  Coronary artery disease Recent LHC with no obstructive disease Continue statin/eliquis  Hypothyroidism Relatively new diagnosis Check TSH Continue home synthroid   Gastroesophageal reflux disease Continue pepcid PRN, no longer on chronic PPI therapy   Pressure ulcer Secondary to being bed bound Turn patient Wound care         Subjective: Patient is feeling better, continue edema at her lower extremities, not back to baseline   Physical Exam: Vitals:   03/30/22 0711 03/30/22 1058 03/30/22 1147 03/30/22 1532  BP:  (!) 85/65  92/74  Pulse:  78    Resp:  20 20   Temp: (!) 97.2 F (36.2 C)     TempSrc: Oral     SpO2:  97%    Weight:      Height:       Neurology awake and alert ENT With no  pallor Cardiovascular with S1 and S2 present irregular with no gallops Respiratory with no rales or wheezing Abdomen not distended Positive lower extremity edema ++ pitting  Data Reviewed:    Family Communication: no family a the bedside   Disposition: Status is: Inpatient Remains inpatient appropriate because: heart failure and hypotension   Planned Discharge Destination: Home      Author: Coralie Keens, MD 03/30/2022 3:48 PM  For on call review [email or web address].

## 2022-03-30 NOTE — Progress Notes (Signed)
Heart Failure Patient Advocate Encounter   Received notification from [facility] that prior authorization for Revatio (sildenafil) is required.   PA submitted on CoverMyMeds Key [id number] Status is approved. Copay $0 per month.    Copay check ran for [facility]. Copay $0 per month.   Sharen Hones, PharmD, BCPS Heart Failure Stewardship Pharmacist Phone [phone]  Please check [email or web address] for unit-specific pharmacist phone numbers

## 2022-03-31 ENCOUNTER — Inpatient Hospital Stay (HOSPITAL_COMMUNITY): Payer: Medicare Other | Admitting: Anesthesiology

## 2022-03-31 ENCOUNTER — Encounter (HOSPITAL_COMMUNITY): Payer: Self-pay | Admitting: Family Medicine

## 2022-03-31 ENCOUNTER — Encounter (HOSPITAL_COMMUNITY)
Admission: EM | Disposition: A | Discharge status: Home Health | Payer: Self-pay | Source: Home / Self Care | Attending: Internal Medicine

## 2022-03-31 DIAGNOSIS — I484 Atypical atrial flutter: Secondary | ICD-10-CM

## 2022-03-31 DIAGNOSIS — N179 Acute kidney failure, unspecified: Secondary | ICD-10-CM | POA: Diagnosis not present

## 2022-03-31 DIAGNOSIS — I5023 Acute on chronic systolic (congestive) heart failure: Secondary | ICD-10-CM | POA: Diagnosis not present

## 2022-03-31 DIAGNOSIS — I480 Paroxysmal atrial fibrillation: Secondary | ICD-10-CM | POA: Diagnosis not present

## 2022-03-31 DIAGNOSIS — I2510 Atherosclerotic heart disease of native coronary artery without angina pectoris: Secondary | ICD-10-CM

## 2022-03-31 DIAGNOSIS — I4891 Unspecified atrial fibrillation: Secondary | ICD-10-CM

## 2022-03-31 DIAGNOSIS — I252 Old myocardial infarction: Secondary | ICD-10-CM

## 2022-03-31 DIAGNOSIS — J90 Pleural effusion, not elsewhere classified: Secondary | ICD-10-CM | POA: Diagnosis not present

## 2022-03-31 HISTORY — PX: CARDIOVERSION: SHX1299

## 2022-03-31 LAB — GLUCOSE, CAPILLARY
Glucose-Capillary: 89 mg/dL (ref 70–99)
Glucose-Capillary: 113 mg/dL — ABNORMAL HIGH (ref 70–99)
Glucose-Capillary: 175 mg/dL — ABNORMAL HIGH (ref 70–99)
Glucose-Capillary: 235 mg/dL — ABNORMAL HIGH (ref 70–99)

## 2022-03-31 LAB — BASIC METABOLIC PANEL
Anion gap: 7 (ref 5–15)
BUN: 18 mg/dL (ref 8–23)
CO2: 28 mmol/L (ref 22–32)
Calcium: 8.8 mg/dL — ABNORMAL LOW (ref 8.9–10.3)
Chloride: 103 mmol/L (ref 98–111)
Creatinine, Ser: 1.11 mg/dL — ABNORMAL HIGH (ref 0.44–1.00)
GFR, Estimated: 52 mL/min — ABNORMAL LOW (ref >60)
Glucose, Bld: 83 mg/dL (ref 70–99)
Potassium: 4.2 mmol/L (ref 3.5–5.1)
Sodium: 138 mmol/L (ref 135–145)

## 2022-03-31 SURGERY — CARDIOVERSION
Anesthesia: General

## 2022-03-31 MED ORDER — MIDODRINE HCL 5 MG PO TABS
5.0000 mg | ORAL_TABLET | Freq: Three times a day (TID) | ORAL | Status: DC
  Administered 2022-03-31 – 2022-04-05 (×15): 5 mg via ORAL
  Filled 2022-03-31 (×15): qty 1

## 2022-03-31 MED ORDER — PHENYLEPHRINE 80 MCG/ML (10ML) SYRINGE FOR IV PUSH (FOR BLOOD PRESSURE SUPPORT)
PREFILLED_SYRINGE | INTRAVENOUS | Status: DC | PRN
  Administered 2022-03-31: 160 ug via INTRAVENOUS
  Administered 2022-03-31: 80 ug via INTRAVENOUS

## 2022-03-31 MED ORDER — METOPROLOL SUCCINATE ER 25 MG PO TB24
25.0000 mg | ORAL_TABLET | Freq: Every day | ORAL | Status: DC
  Administered 2022-03-31 – 2022-04-01 (×2): 25 mg via ORAL
  Filled 2022-03-31 (×2): qty 1

## 2022-03-31 MED ORDER — PROPOFOL 10 MG/ML IV BOLUS
INTRAVENOUS | Status: DC | PRN
  Administered 2022-03-31: 60 mg via INTRAVENOUS

## 2022-03-31 MED ORDER — AMIODARONE HCL 200 MG PO TABS
200.0000 mg | ORAL_TABLET | Freq: Every day | ORAL | Status: DC
  Administered 2022-03-31 – 2022-04-01 (×2): 200 mg via ORAL
  Filled 2022-03-31 (×2): qty 1

## 2022-03-31 MED ORDER — LIDOCAINE HCL (CARDIAC) PF 100 MG/5ML IV SOSY
PREFILLED_SYRINGE | INTRAVENOUS | Status: DC | PRN
  Administered 2022-03-31: 60 mg via INTRAVENOUS

## 2022-03-31 MED ORDER — EPHEDRINE SULFATE (PRESSORS) 50 MG/ML IJ SOLN
INTRAMUSCULAR | Status: DC | PRN
  Administered 2022-03-31: 15 mg via INTRAVENOUS
  Administered 2022-03-31: 10 mg via INTRAVENOUS

## 2022-03-31 MED ORDER — BISACODYL 5 MG PO TBEC
5.0000 mg | DELAYED_RELEASE_TABLET | Freq: Once | ORAL | Status: AC
  Administered 2022-03-31: 5 mg via ORAL
  Filled 2022-03-31: qty 1

## 2022-03-31 NOTE — Progress Notes (Signed)
OT Cancellation Note  Patient Details Name: Ashley [name] MRN: [id number] DOB: [birth year]   Cancelled Treatment:    Reason Eval/Treat Not Completed: Patient declined, no reason specified (patient declined OT today stating she was too tired from the anesthesia from this morning.) Alfonse Flavors, OTA Acute Rehabilitation Services  Office [phone]  Dewain Penning 03/31/2022, 2:51 PM

## 2022-03-31 NOTE — Transfer of Care (Signed)
Immediate Anesthesia Transfer of Care Note  Patient: Ashley [name]  Procedure(s) Performed: CARDIOVERSION  Patient Location: Endoscopy Unit  Anesthesia Type:MAC  Level of Consciousness: drowsy  Airway & Oxygen Therapy: Patient Spontanous Breathing and Patient connected to face mask oxygen  Post-op Assessment: Report given to RN and Post -op Vital signs reviewed and stable  Post vital signs: Reviewed and stable  Last Vitals:  Vitals Value Taken Time  BP    Temp    Pulse    Resp    SpO2      Last Pain:  Vitals:   03/31/22 0730  TempSrc: Temporal  PainSc: 0-No pain         Complications: No notable events documented.

## 2022-03-31 NOTE — Progress Notes (Signed)
Orthopedic Tech Progress Note Patient Details:  Ashley [name] Jan 09, 1946 [id number]  Ortho Devices Type of Ortho Device: Radio broadcast assistant Ortho Device/Splint Location: Bi LE Ortho Device/Splint Interventions: Application   Post Interventions Patient Tolerated: Well  [name] 03/31/2022, 2:49 PM

## 2022-03-31 NOTE — CV Procedure (Addendum)
Direct current cardioversion 03/31/2022 7:50 AM  Indication symptomatic Atypical atrial flutter.  Procedure: Using 60 mg of IV Propofol and 60 IV Lidocaine (for reducing venous pain) for achieving deep sedation, synchronized direct current cardioversion performed. Patient was delivered with 100  followed by 150 Joules of electricity X 1 with success to NSR. Patient tolerated the procedure well. No immediate complication noted.   I have reduced the dose of amio from 200 TID to q daily. Metoprolol succ from 50 to 25 mg daily due to bradycardia. Probably ectopic atrial rhythm episodes    Yates Decamp, MD, [facility] 03/31/2022, 7:50 AM Office: [phone] Fax: [phone] Pager: [phone]

## 2022-03-31 NOTE — Interval H&P Note (Signed)
History and Physical Interval Note:  03/31/2022 7:40 AM  Ashley [name]  has presented today for surgery, with the diagnosis of afib.  The various methods of treatment have been discussed with the patient and family. After consideration of risks, benefits and other options for treatment, the patient has consented to  Procedure(s): CARDIOVERSION (N/A) as a surgical intervention.  The patient's history has been reviewed, patient examined, no change in status, stable for surgery.  I have reviewed the patient's chart and labs.  Questions were answered to the patient's satisfaction.     Yates Decamp

## 2022-03-31 NOTE — Progress Notes (Signed)
Progress Note   Patient: Ashley [name] GEX:[id number] DOB: 12-27-1945 DOA: 03/26/2022     5 DOS: the patient was seen and examined on 03/31/2022   Brief hospital course: Mrs. Fizer was admitted to the hospital with the working diagnosis of decompensated heart failure.   76 yo female with the past medical history of hypertension, CVA, T2DM, Coronary artery disease, pulmonary embolism, hypothyroid and paroxysmal atrial fibrillation who presented with lower extremity edema and ambulatory dysfunction. She was discharged from [facility] 09/2021, requiring assistance to most activities of daily life. On her initial physical examination her blood pressure was 102/61, HR 52, RR 20 and 02 saturation 96% on room air. Heart with S1 and S2 irregularly irregular, lungs with decreased breath sounds more on right lower lobe, abdomen not distended and 3+ pitting bilateral lower extremity edema.   Na 140, K 2,7 CL 100 bicarbonate 27 glucose 100 bun 10 cr 1,13 BNP 1,904  Wbc 7,3 hgb 12,3 plt 197  [place] SG 1,006, 6-10 wbc   CT head no acute changes.   Chest radiograph right rotation, with cardiomegaly, right loculated pleural effusion.   EKG 110 bpm, normal axis, normal qtc, atrial fibrillation rhythm, with no significant ST segment changes, lead II, III, AvF, V4 to V6 T wave inversion.   Patient was placed on furosemide for diuresis.  Echocardiogram with severe RV dysfunction.   07/13 Patient had direct current cardioversion.    Assessment and Plan: * Acute on chronic systolic (congestive) heart failure (HCC) Echocardiogram with LV systolic function reduced to 40 to 45%, septal hypokinesis. Moderate LVH, interventricular  Septum is flattened in systolic and diastole. Severe reduction in RV systolic function. RVSP 45.5 mmHg. LA severe dilatation. Small pericardial effusion. Moderate TR.   Acute on chronic core pulmonale. Pulmonary hypertension.   Documented urine output is 200 (possible not  accurate) Systolic blood pressure 88 to 97 mmHg.   Continue metoprolol and furosemide Will add midodrine.   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  Continue with sildenafil.  Holding parameters for metoprolol.  Unna boots have been ordered.    AKI (acute kidney injury) (HCC) Hypokalemia. Hypomagnesemia   Renal function today with serum cr at 1,1 with K at 4,2 and serum bicarbonate at 28.  Continue diuresis with furosemide.  Avoid hypotension and nephrotoxic medications.   Pleural effusion, right Loculated chronic right pleural effusion.  Continue diuresis as tolerated.   Pulmonary embolus (HCC) Hx of submassive PE and chronic DVT in 06/2021 Continue eliquis   Paroxysmal atrial fibrillation (HCC) SP direct current cardioversion with conversion to sinus rhythm for a brief period of time. Patient is back on atrial fibrillation.  Plan to continue with amiodarone 200 mg daily and low dose of metoprolol. Continue anticoagulation with apixaban.   Controlled diabetes mellitus type II without complication (HCC) A1C in 2022 was 6.2, update now Continue glucose cover and monitoring with insulin sliding scale.  Fasting glucose today is 83 mg/dl.    Essential hypertension Continue with metoprolol with holding parameters. Add midodrine for blood pressure support.   History of stroke Continue apixaban and statin therapy.   Coronary artery disease Recent LHC with no obstructive disease Continue statin/eliquis  Hypothyroidism Relatively new diagnosis Check TSH Continue home synthroid   Gastroesophageal reflux disease Continue pepcid PRN, no longer on chronic PPI therapy   Pressure ulcer Secondary to being bed bound Turn patient Wound care  Stage 2 coccygeal (present on admission).  Subjective: Patient is feeling better, dyspnea has improved, no chest pain, continue to have lowe extremity edema    Physical Exam: Vitals:   03/31/22 0850 03/31/22 0916 03/31/22 0928 03/31/22 1039  BP: (!) 98/48 110/65 123/67 (!) 105/56  Pulse: 88 70 95 70  Resp: 17 17 20 20   Temp:    (!) 97.2 F (36.2 C)  TempSrc:    Oral  SpO2: 97% 90% 100% 96%  Weight:      Height:       Neurology awake and alert ENT with mild pallor Cardiovascular with S1 and S2 present irregularly irregular with positive murmur at the right sternal border 3/6 No JVD Positive lower extremity edema ++ pitting Respiratory with no rales of wheezing Abdomen not distended  Data Reviewed:    Family Communication: I spoke over the phone with the patient's son about patient's  condition, plan of care, prognosis and all questions were addressed.   Disposition: Status is: Inpatient Remains inpatient appropriate because: heart failure   Planned Discharge Destination: Home    Author: , MD 03/31/2022 12:55 PM  For on call review [email or web address].

## 2022-03-31 NOTE — Anesthesia Postprocedure Evaluation (Signed)
Anesthesia Post Note  Patient: Biochemist, clinical  Procedure(s) Performed: CARDIOVERSION     Patient location during evaluation: PACU Anesthesia Type: General Level of consciousness: awake and alert Pain management: pain level controlled Vital Signs Assessment: post-procedure vital signs reviewed and stable Respiratory status: spontaneous breathing, nonlabored ventilation, respiratory function stable and patient connected to nasal cannula oxygen Cardiovascular status: blood pressure returned to baseline and stable Postop Assessment: no apparent nausea or vomiting Anesthetic complications: no   No notable events documented.  Last Vitals:  Vitals:   03/31/22 0807 03/31/22 0817  BP: (!) 77/53 94/66  Pulse: 62 69  Resp: 20 20  Temp:    SpO2: 95% 95%    Last Pain:  Vitals:   03/31/22 0817  TempSrc:   PainSc: 0-No pain                 Magdalena Skilton

## 2022-03-31 NOTE — Plan of Care (Signed)
  Problem: Skin Integrity: Goal: Risk for impaired skin integrity will decrease Outcome: Progressing   Problem: Clinical Measurements: Goal: Respiratory complications will improve Outcome: Progressing   Problem: Clinical Measurements: Goal: Cardiovascular complication will be avoided Outcome: Progressing   Problem: Activity: Goal: Risk for activity intolerance will decrease Outcome: Progressing   Problem: Pain Managment: Goal: General experience of comfort will improve Outcome: Progressing   Problem: Safety: Goal: Ability to remain free from injury will improve Outcome: Progressing   Problem: Skin Integrity: Goal: Risk for impaired skin integrity will decrease Outcome: Progressing

## 2022-04-01 DIAGNOSIS — N179 Acute kidney failure, unspecified: Secondary | ICD-10-CM | POA: Diagnosis not present

## 2022-04-01 DIAGNOSIS — J90 Pleural effusion, not elsewhere classified: Secondary | ICD-10-CM | POA: Diagnosis not present

## 2022-04-01 DIAGNOSIS — I480 Paroxysmal atrial fibrillation: Secondary | ICD-10-CM | POA: Diagnosis not present

## 2022-04-01 DIAGNOSIS — I5023 Acute on chronic systolic (congestive) heart failure: Secondary | ICD-10-CM | POA: Diagnosis not present

## 2022-04-01 LAB — GLUCOSE, CAPILLARY
Glucose-Capillary: 103 mg/dL — ABNORMAL HIGH (ref 70–99)
Glucose-Capillary: 105 mg/dL — ABNORMAL HIGH (ref 70–99)
Glucose-Capillary: 94 mg/dL (ref 70–99)
Glucose-Capillary: 99 mg/dL (ref 70–99)
Glucose-Capillary: 135 mg/dL — ABNORMAL HIGH (ref 70–99)

## 2022-04-01 LAB — BASIC METABOLIC PANEL
Anion gap: 5 (ref 5–15)
BUN: 21 mg/dL (ref 8–23)
CO2: 28 mmol/L (ref 22–32)
Calcium: 8.7 mg/dL — ABNORMAL LOW (ref 8.9–10.3)
Chloride: 104 mmol/L (ref 98–111)
Creatinine, Ser: 1.03 mg/dL — ABNORMAL HIGH (ref 0.44–1.00)
GFR, Estimated: 57 mL/min — ABNORMAL LOW (ref >60)
Glucose, Bld: 89 mg/dL (ref 70–99)
Potassium: 4.1 mmol/L (ref 3.5–5.1)
Sodium: 137 mmol/L (ref 135–145)

## 2022-04-01 LAB — MAGNESIUM: Magnesium: 2 mg/dL (ref 1.7–2.4)

## 2022-04-01 NOTE — Progress Notes (Signed)
RN received call from CCMD about patient's HR dropping into mid 30s with some 2 second pauses. Stated patient was not sustaining HR in the 30s.  Upon RN assessing patient, she was alert and asymptomatic.  MD notified. Medication changes made by MD.

## 2022-04-01 NOTE — Progress Notes (Signed)
Progress Note   Patient: Ashley [name] VQM:[id number] DOB: February 17, 1946 DOA: 03/26/2022     6 DOS: the patient was seen and examined on 04/01/2022   Brief hospital course: Mrs. Gillin was admitted to the hospital with the working diagnosis of decompensated heart failure.   76 yo female with the past medical history of hypertension, CVA, T2DM, Coronary artery disease, pulmonary embolism, hypothyroid and paroxysmal atrial fibrillation who presented with lower extremity edema and ambulatory dysfunction. She was discharged from [facility] 09/2021, requiring assistance to most activities of daily life. On her initial physical examination her blood pressure was 102/61, HR 52, RR 20 and 02 saturation 96% on room air. Heart with S1 and S2 irregularly irregular, lungs with decreased breath sounds more on right lower lobe, abdomen not distended and 3+ pitting bilateral lower extremity edema.   Na 140, K 2,7 CL 100 bicarbonate 27 glucose 100 bun 10 cr 1,13 BNP 1,904  Wbc 7,3 hgb 12,3 plt 197  [place] SG 1,006, 6-10 wbc   CT head no acute changes.   Chest radiograph right rotation, with cardiomegaly, right loculated pleural effusion.   EKG 110 bpm, normal axis, normal qtc, atrial fibrillation rhythm, with no significant ST segment changes, lead II, III, AvF, V4 to V6 T wave inversion.   Patient was placed on furosemide for diuresis.  Echocardiogram with severe RV dysfunction.   07/13 Patient had direct current cardioversion.    Assessment and Plan: * Acute on chronic systolic (congestive) heart failure (HCC) Echocardiogram with LV systolic function reduced to 40 to 45%, septal hypokinesis. Moderate LVH, interventricular  Septum is flattened in systolic and diastole. Severe reduction in RV systolic function. RVSP 45.5 mmHg. LA severe dilatation. Small pericardial effusion. Moderate TR.   Acute on chronic core pulmonale. Pulmonary hypertension.   Blood pressure 114 to 129 mmHg.  Continue metoprolol and  furosemide Tolerating well midodrine.   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  Continue with sildenafil.  Bilateral unna boots with improvement of peripheral edema.    AKI (acute kidney injury) (HCC) Hypokalemia. Hypomagnesemia   Renal function with serum cr at 1,0 with K at 4,1 and serum bicarbonate at 28. Continue diuresis with furosemide.   Pleural effusion, right Loculated chronic right pleural effusion.  Continue diuresis as tolerated.   Pulmonary embolus (HCC) Hx of submassive PE and chronic DVT in 06/2021 Continue eliquis   Paroxysmal atrial fibrillation (HCC) This am patient with sinus rhythm,  Continue amiodarone and metoprolol Anticoagulation with apixaban.   Controlled diabetes mellitus type II without complication (HCC) A1C in 2022 was 6.2, update now Continue glucose cover and monitoring with insulin sliding scale.   Essential hypertension Improved blood pressure with midodrine Continue with metoprolol.   History of stroke Continue apixaban and statin therapy.   Coronary artery disease Recent LHC with no obstructive disease Continue statin/eliquis  Hypothyroidism Relatively new diagnosis Continue home synthroid   Gastroesophageal reflux disease Continue pepcid PRN, no longer on chronic PPI therapy   Pressure ulcer Secondary to being bed bound Turn patient Wound care  Stage 2 coccygeal (present on admission).            Subjective: Patient is feeling well, no dyspnea or chest pain. She continue to decline SNF for rehab  Physical Exam: Vitals:   04/01/22 0408 04/01/22 0711 04/01/22 0951 04/01/22 1102  BP: 128/64 (!) 117/58 (!) 117/57 114/73  Pulse: 84 81 61 81  Resp: 20  20 19 15   Temp: 97.6 F (36.4 C) (!) 97.4 F (36.3 C)  (!) 97.4 F (36.3 C)  TempSrc: Oral Oral  Oral  SpO2: 100% 98%  100%  Weight: 88.7 kg     Height:       Neurology awake and alert ENT with mild  pallor Cardiovascular with S1 and S2 present no gallops Respiratory with no rales or wheezing Abdomen not distended No trace lower extremity edema wraps in place.  Data Reviewed:    Family Communication: I spoke over the phone with the patient's son about patient's  condition, plan of care, prognosis and all questions were addressed.   Disposition: Status is: Inpatient Remains inpatient appropriate because: needs placement   Planned Discharge Destination: Home  Author: , MD 04/01/2022 12:36 PM  For on call review [email or web address].

## 2022-04-01 NOTE — TOC Progression Note (Addendum)
Transition of Care [facility]) - Progression Note    Patient Details  Name: Rodney Wigger MRN: [id number] Date of Birth: 08-12-1946  [facility]) CM/SW Contact  Leone Haven, RN Phone Number: 04/01/2022, 2:50 PM  Clinical Narrative:    NCM contacted [facility], they state she has Medicaid but not the regular medicaid that covers PCS  services.  Her code for Medicaid is [id number]. This is a nonqulifying medicaid for [employer] services. She does not have a skill need , per PT eval rec long tem care.  Patient is bed bound she is mostly custodial care and needs long term care which the family or patient would have to pay privately since does not have Medicaid.  They could apply for Medicaid.  CSW called [facility] to check with them to see what she was under when she there, they states she did not meet Medicaid qualifications , so Medicaid was pending.  She transitioned to long term care on 09/09/21 to 10/15/21 thru private pay.  She may still have a bill with them.  I also received a call from Eunice Blase her insurance agent who staates he is not in the office and will call this NCM on Monday.  His phone is [phone].  NCM spoke with son Iantha Fallen today, he states going home with him is not an option.   He states he is there temporary right now.      Barriers to Discharge: Continued Medical Work up  Expected Discharge Plan and Services   In-house Referral: Clinical Social Work Discharge Planning Services: CM Consult   Living arrangements for the past 2 months: Single Family Home                                       Social Determinants of Health (SDOH) Interventions    Readmission Risk Interventions     No data to display

## 2022-04-01 NOTE — Progress Notes (Signed)
Subjective:  States that she is feeling better.   Intake/Output from previous day:  I/O last 3 completed shifts: In: 2094 [P.O.:930; I.V.:398] Out: 350 [Urine:350] Total I/O In: 240 [P.O.:240] Out: 350 [Urine:350] Net IO Since Admission: -2,034.99 mL [04/01/22 0653]  Blood pressure 128/64, pulse 84, temperature 97.6 F (36.4 C), temperature source Oral, resp. rate 20, height _0  (1.727 m), weight 88.7 kg, SpO2 100 %.  Physical Exam Constitutional:      Appearance: She is ill-appearing.  Neck:     Vascular: No JVD.  Cardiovascular:     Rate and Rhythm: Normal rate and regular rhythm.     Pulses:          Dorsalis pedis pulses are 1+ on the right side and 1+ on the left side.       Posterior tibial pulses are 0 on the right side and 0 on the left side.     Heart sounds: Normal heart sounds. No murmur heard.    No gallop.  Pulmonary:     Effort: Pulmonary effort is normal.     Breath sounds: Normal breath sounds.  Abdominal:     General: Bowel sounds are normal.     Palpations: Abdomen is soft.     Comments: Obese  Musculoskeletal:     Right lower leg: No edema (Large adipose tissue present, skin is shrunken).     Left lower leg: No edema (Large adipose tissue present, skin is shrunken).     Lab Results: BMP BNP (last 3 results) Recent Labs    06/27/21 0441 07/28/21 1231 03/26/22 1030  BNP 1,914.8* 837.6* 1,904.9*     ProBNP (last 3 results) No results for input(s): "PROBNP" in the last 8760 hours.    Latest Ref Rng & Units 04/01/2022    4:38 AM 03/31/2022    2:34 AM 03/30/2022    1:18 AM  BMP  Glucose 70 - 99 mg/dL 89  83  86   BUN 8 - 23 mg/dL _1 Creatinine 0.44 - 1.00 mg/dL 1.03  1.11  1.01   Sodium 135 - 145 mmol/L 137  138  138   Potassium 3.5 - 5.1 mmol/L 4.1  4.2  3.5   Chloride 98 - 111 mmol/L 104  103  100   CO2 22 - 32 mmol/L _2 Calcium 8.9 - 10.3 mg/dL 8.7  8.8  8.5       Latest Ref Rng & Units 03/26/2022   10:30 AM  07/29/2021    3:37 AM 07/28/2021    9:40 PM  Hepatic Function  Total Protein 6.5 - 8.1 g/dL 6.0  6.2  6.4   Albumin 3.5 - 5.0 g/dL 2.1  2.5  2.7   AST 15 - 41 U/L 17  60  61   ALT 0 - 44 U/L _3 Alk Phosphatase 38 - 126 U/L 82  67  68   Total Bilirubin 0.3 - 1.2 mg/dL 1.6  0.9  1.3   Bilirubin, Direct 0.0 - 0.2 mg/dL 0.5         Latest Ref Rng & Units 03/27/2022    1:49 AM 03/26/2022   10:30 AM 07/31/2021    5:22 AM  CBC  WBC 4.0 - 10.5 K/uL 5.8  7.3  6.6   Hemoglobin 12.0 - 15.0 g/dL 11.8  12.3  11.3   Hematocrit 36.0 - 46.0 % 35.0  38.0  35.3   Platelets 150 - 400 K/uL 198  197  103    Lipid Panel     Component Value Date/Time   CHOL 163 04/05/2020 0334   TRIG 95 06/22/2021 0302   HDL 63 04/05/2020 0334   CHOLHDL 2.6 04/05/2020 0334   VLDL 13 04/05/2020 0334   LDLCALC 87 04/05/2020 0334   Cardiac Panel (last 3 results) No results for input(s): "CKTOTAL", "CKMB", "TROPONINI", "RELINDX" in the last 72 hours.  HEMOGLOBIN A1C Lab Results  Component Value Date   HGBA1C 5.1 03/26/2022   MPG 99.67 03/26/2022   TSH Recent Labs    06/19/21 1229 07/29/21 0337 03/26/22 1851  TSH 1.412 1.511 5.683*     Current Facility-Administered Medications:    (feeding supplement) PROSource Plus liquid 30 mL, 30 mL, Oral, BID BM, Adrian Prows, MD, 30 mL at 03/31/22 1320   0.9 %  sodium chloride infusion, 250 mL, Intravenous, PRN, Adrian Prows, MD   acetaminophen (TYLENOL) tablet 650 mg, 650 mg, Oral, Q6H PRN **OR** acetaminophen (TYLENOL) suppository 650 mg, 650 mg, Rectal, Q6H PRN, Adrian Prows, MD   amiodarone (PACERONE) tablet 200 mg, 200 mg, Oral, Daily, Adrian Prows, MD, 200 mg at 03/31/22 4742   apixaban (ELIQUIS) tablet 5 mg, 5 mg, Oral, BID, Adrian Prows, MD, 5 mg at 03/31/22 2101   famotidine (PEPCID) tablet 20 mg, 20 mg, Oral, QHS PRN, Adrian Prows, MD, 20 mg at 03/31/22 2101   feeding supplement (ENSURE ENLIVE / ENSURE PLUS) liquid 237 mL, 237 mL, Oral, BID BM, Adrian Prows, MD,  237 mL at 03/31/22 1320   furosemide (LASIX) tablet 20 mg, 20 mg, Oral, Daily, Adrian Prows, MD, 20 mg at 03/31/22 0935   insulin aspart (novoLOG) injection 0-9 Units, 0-9 Units, Subcutaneous, TID WC, Adrian Prows, MD, 2 Units at 03/31/22 1636   levothyroxine (SYNTHROID) tablet 25 mcg, 25 mcg, Oral, Q0600, Adrian Prows, MD, 25 mcg at 04/01/22 0606   metoprolol succinate (TOPROL-XL) 24 hr tablet 25 mg, 25 mg, Oral, Daily, Adrian Prows, MD, 25 mg at 03/31/22 0938   midodrine (PROAMATINE) tablet 5 mg, 5 mg, Oral, TID WC, Arrien, Jimmy Picket, MD, 5 mg at 04/01/22 0606   multivitamin with minerals tablet 1 tablet, 1 tablet, Oral, Daily, Adrian Prows, MD, 1 tablet at 03/31/22 0903   rosuvastatin (CRESTOR) tablet 5 mg, 5 mg, Oral, Daily, Adrian Prows, MD, 5 mg at 03/31/22 5956   sildenafil (REVATIO) tablet 20 mg, 20 mg, Oral, TID, Adrian Prows, MD, 20 mg at 03/31/22 2101   sodium chloride flush (NS) 0.9 % injection 3 mL, 3 mL, Intravenous, Q12H, Adrian Prows, MD, 3 mL at 03/31/22 2104   sodium chloride flush (NS) 0.9 % injection 3 mL, 3 mL, Intravenous, PRN, Adrian Prows, MD  Radiology:    DG Chest 2 View  Date: 03/28/2022 CLINICAL DATA:  Pleural effusion EXAM: CHEST - 2 VIEW COMPARISON:  Chest x-ray dated March 26, 2022 FINDINGS: Unchanged enlarged cardiac and mediastinal contours. Stable moderate loculated right pleural effusion. No evidence of left pleural effusion. No evidence of pneumothorax. IMPRESSION: 1. Stable moderate loculated right pleural effusion. 2. Cardiomegaly. Electronically Signed   By: Yetta Glassman M.D.   On: 03/28/2022 15:03     Cardiac Studies:    Left heart catheterization: 07/29/2021: Right dominant circulation Tortuous vessels with no significant coronary artery disease Normal LVEDP  ABI 03/29/2022: Right: Resting right ankle-brachial index is within normal range (1.0). No  evidence of significant right lower  extremity arterial disease.   Left: Resting left ankle-brachial index is  within normal range 1.09). No  evidence of significant left lower extremity arterial disease.    Echocardiogram 07/010/2023:   1. Left ventricular ejection fraction, by estimation, is 40 to 45%. The left ventricle has mildly decreased function. The left ventricle demonstrates regional wall motion abnormalities. Septal hypokinesis. There is moderate left ventricular hypertrophy.  Left ventricular diastolic parameters are indeterminate. There is the interventricular septum is flattened in systole and diastole, consistent with right ventricular pressure and volume overload.  2. Right ventricular systolic function is severely reduced. The right ventricular size is moderately enlarged. There is moderately elevated pulmonary artery systolic pressure. The estimated right ventricular systolic pressure is 82.5 mmHg.  3. Left atrial size was severely dilated.  4. Right atrial size was mildly dilated.  5. A small pericardial effusion is present.  6. The mitral valve is normal in structure. Mild mitral valve regurgitation. No evidence of mitral stenosis.  7. Tricuspid valve regurgitation is moderate.  8. The aortic valve was not well visualized. Aortic valve regurgitation is trivial. No aortic stenosis is present.  9. Aortic dilatation noted. There is mild dilatation of the ascending aorta, measuring 38 mm. 10. The inferior vena cava is normal in size with greater than 50% respiratory variability, suggesting right atrial pressure of 3 mmHg.   EKG:   EKG 03/26/2022: Atypical atrial flutter with variable AV conduction, ventricular rate 110 bpm.  Incomplete right bundle branch block.  Marked T wave abnormality, cannot exclude lateral ischemia.  Compared to 07/30/2021, sinus rhythm has been replaced.  T abnormality is new.  EKG 04/01/2022: Wandering atrial pacemaker, ventricular rate 58 bpm.  Normal axis.  Incomplete right bundle branch block.  Nonspecific T abnormality.   Assessment    Ashley [name]  is  a 76 y.o. 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  She is presently at home and was discharged from [facility] in January 2023. 1.  Acute on chronic cor pulmonale with decompensated RV heart failure secondary to submassive pulmonary embolism in September 2022.  Also has acute on chronic systolic and diastolic heart failure of LV, now resolved and stable. 2.  Primary hypertension, blood pressures soft. 3.  Morbid obesity now resolved. 4.  History of stroke with significant amount of deconditioning and mild right lower extremity weakness 5.  Atypical atrial flutter.  CHA2DS2-VASc Score is 7.  Yearly risk of stroke: >10% (F, A, HTN, CHF, Old stroke).  Score of 1=0.6; 2=2.2; 3=3.2; 4=4.8; 5=7.2; 6=9.8; 7=>9.8) -(CHF; HTN; vasc disease DM,  Female = 1; Age <65 =0; 65-74 = 1,  >75 =2; stroke/embolism= 2).     Recommendations:    Patient's fluid status is improved, she is not in acute decompensated heart failure.  Continue sildenafil for group 2 and 4 PAH.  Long-term prognosis remains grim in view of severe pulm hypertension, RV dysfunction, or stroke, age and deconditioning.  Not a candidate for any further aggressive measures.  Patient is presently DNR.  Poor quality of life, essentially bedbound.  With regard to atypical atrial flutter, she is maintaining ectopic atrial rhythm/multifocal atrial tachycardia/wandering atrial pacemaker.  Heart rate has been fairly  well controlled.  Beta-blocker dose has been reduced and amiodarone dose has been reduced.  Stable from cardiac standpoint.  We will sign off, please call  if questions.    Adrian Prows, MD, [facility] 04/01/2022, 6:53 AM Office: [phone] Fax: [phone] Pager: [phone]

## 2022-04-01 NOTE — Progress Notes (Addendum)
I have explained Ashley [name] about the recommendations to be discharge to SNF for physical rehabilitation. She is very deconditioned and not ambulatory. At home she has limited help and is very high risk for decompensation.  She does not want to be discharge to SNF for rehab under no circumstances. She has been in a rehab before and she would like to stay at home instead. She demonstrates understanding of possible consequences of being at home with limited to no assistance including rehospitalization and even death.  After extensive conversation with her she insists in going home.  To my opinion she has medical capacity to make her decisions and I have informed her son about this situation.  I will call psychiatry to have a second opinion before Ashley [name] is being discharged home.

## 2022-04-02 DIAGNOSIS — Z1330 Encounter for screening examination for mental health and behavioral disorders, unspecified: Secondary | ICD-10-CM

## 2022-04-02 DIAGNOSIS — I480 Paroxysmal atrial fibrillation: Secondary | ICD-10-CM | POA: Diagnosis not present

## 2022-04-02 DIAGNOSIS — J90 Pleural effusion, not elsewhere classified: Secondary | ICD-10-CM | POA: Diagnosis not present

## 2022-04-02 DIAGNOSIS — I5023 Acute on chronic systolic (congestive) heart failure: Secondary | ICD-10-CM | POA: Diagnosis not present

## 2022-04-02 DIAGNOSIS — I10 Essential (primary) hypertension: Secondary | ICD-10-CM

## 2022-04-02 DIAGNOSIS — N179 Acute kidney failure, unspecified: Secondary | ICD-10-CM | POA: Diagnosis not present

## 2022-04-02 LAB — GLUCOSE, CAPILLARY
Glucose-Capillary: 97 mg/dL (ref 70–99)
Glucose-Capillary: 87 mg/dL (ref 70–99)
Glucose-Capillary: 97 mg/dL (ref 70–99)
Glucose-Capillary: 108 mg/dL — ABNORMAL HIGH (ref 70–99)

## 2022-04-02 MED ORDER — SIMETHICONE 80 MG PO CHEW
80.0000 mg | CHEWABLE_TABLET | Freq: Four times a day (QID) | ORAL | Status: AC
  Administered 2022-04-03 (×2): 80 mg via ORAL
  Filled 2022-04-02 (×3): qty 1

## 2022-04-02 NOTE — Consult Note (Signed)
[facility] Face-to-Face Psychiatry Consult   Reason for Consult: '' Please help assist in competence Referring Physician:  Erin Hearing, MD Patient Identification: Ashley [name] MRN:  [id number] Principal Diagnosis: Acute on chronic systolic (congestive) heart failure (HCC) Diagnosis:  Principal Problem:   Acute on chronic systolic (congestive) heart failure (HCC) Active Problems:   Essential hypertension   Controlled diabetes mellitus type II without complication (HCC)   Paroxysmal atrial fibrillation (HCC)   AKI (acute kidney injury) (HCC)   Pulmonary embolus (HCC)   History of stroke   Coronary artery disease   Gastroesophageal reflux disease   Hypothyroidism   Pleural effusion, right   Pressure ulcer   Atypical atrial flutter (HCC)   Total Time spent with patient: 1 hour  Subjective:   Ashley [name] is a 76 y.o. female patient admitted with decompensated heart failure.  HPI: Patient is a 76 yo female with the past medical history of hypertension, CVA, T 2 DM, Coronary artery disease, pulmonary embolism, hypothyroid and paroxysmal atrial fibrillation who presented with lower extremity edema and ambulatory dysfunction. Psychiatric consult was initiated for capacity evaluation after patient refused the treating physician recommendation for her to be discharged to SNF for physical rehabilitation due to patient being deconditioned and non-ambulatory. However, patient wants to be discharge to her home instead. Patient is alert, awake and oriented x 4. She states that she was in a rehab before and did not get much out it and she would rather be discharged home where she has family and friends willing to assist her. Patient states that no one can force her to go to rehab as long as she believe she is mentally sound. She denies prior history of mental illness, memory issue, drug and alcohol abuse. She demonstrate understanding of information presented to her regarding her health and  consequences of being at home with limited medical resources which include rehospitalization and possibly death. Patient is able to verbalize the alternative treatment such as family help and home health aide if she qualifies.Patient is firm in her decision to be discharged home, says she makes her decision based on her personal believe that going to rehab will not help her but says she will get better care form her family and friends. Based on my evaluation today, patient has Capacity to make informed medical decision. Patient scored 27/30 on mini-mental state examination-could be normal  Past Psychiatric History: none reported  Risk to Self:  denies Risk to Others:  denies Prior Inpatient Therapy:  denies Prior Outpatient Therapy:    Past Medical History:  Past Medical History:  Diagnosis Date   Essential hypertension 11/23/2018   Obesity 11/23/2018   Stroke [facility])     Past Surgical History:  Procedure Laterality Date   CARDIOVERSION N/A 06/21/2021   Procedure: CARDIOVERSION;  Surgeon: Elder Negus, MD;  Location: [facility] ENDOSCOPY;  Service: Cardiovascular;  Laterality: N/A;   CARDIOVERSION N/A 03/31/2022   Procedure: CARDIOVERSION;  Surgeon: Yates Decamp, MD;  Location: [facility] ENDOSCOPY;  Service: Cardiovascular;  Laterality: N/A;   LEFT HEART CATH AND CORONARY ANGIOGRAPHY N/A 07/29/2021   Procedure: LEFT HEART CATH AND CORONARY ANGIOGRAPHY;  Surgeon: Elder Negus, MD;  Location: [facility] INVASIVE CV LAB;  Service: Cardiovascular;  Laterality: N/A;   TEE WITHOUT CARDIOVERSION N/A 06/21/2021   Procedure: TRANSESOPHAGEAL ECHOCARDIOGRAM (TEE);  Surgeon: Elder Negus, MD;  Location: [facility] ENDOSCOPY;  Service: Cardiovascular;  Laterality: N/A;   Family History:  Family History  Problem Relation Age of Onset  Stroke Mother    Hypertension Mother    Family Psychiatric  History:  Social History:  Social History   Substance and Sexual Activity  Alcohol Use No     Social History    Substance and Sexual Activity  Drug Use No    Social History   Socioeconomic History   Marital status: Single    Spouse name: Not on file   Number of children: 1   Years of education: Not on file   Highest education level: Not on file  Occupational History   Not on file  Tobacco Use   Smoking status: Never    Passive exposure: Never   Smokeless tobacco: Never  Vaping Use   Vaping Use: Never used  Substance and Sexual Activity   Alcohol use: No   Drug use: No   Sexual activity: Not Currently  Other Topics Concern   Not on file  Social History Narrative   Not on file   Social Determinants of Health   Financial Resource Strain: Not on file  Food Insecurity: Not on file  Transportation Needs: Not on file  Physical Activity: Not on file  Stress: Not on file  Social Connections: Not on file   Additional Social History:    Allergies:   Allergies  Allergen Reactions   Bystolic [Nebivolol Hcl] Other (See Comments)    Bradycardia    Coreg [Carvedilol] Other (See Comments)    bradycardia   Minoxidil Other (See Comments)    Abdominal pain    Labs:  Results for orders placed or performed during the hospital encounter of 03/26/22 (from the past 48 hour(s))  Glucose, capillary     Status: Abnormal   Collection Time: 03/31/22  4:29 PM  Result Value Ref Range   Glucose-Capillary 175 (H) 70 - 99 mg/dL    Comment: Glucose reference range applies only to samples taken after fasting for at least 8 hours.  Glucose, capillary     Status: Abnormal   Collection Time: 03/31/22  9:16 PM  Result Value Ref Range   Glucose-Capillary 235 (H) 70 - 99 mg/dL    Comment: Glucose reference range applies only to samples taken after fasting for at least 8 hours.  Glucose, capillary     Status: Abnormal   Collection Time: 04/01/22  4:22 AM  Result Value Ref Range   Glucose-Capillary 103 (H) 70 - 99 mg/dL    Comment: Glucose reference range applies only to samples taken after fasting  for at least 8 hours.  Basic metabolic panel     Status: Abnormal   Collection Time: 04/01/22  4:38 AM  Result Value Ref Range   Sodium 137 135 - 145 mmol/L   Potassium 4.1 3.5 - 5.1 mmol/L   Chloride 104 98 - 111 mmol/L   CO2 28 22 - 32 mmol/L   Glucose, Bld 89 70 - 99 mg/dL    Comment: Glucose reference range applies only to samples taken after fasting for at least 8 hours.   BUN 21 8 - 23 mg/dL   Creatinine, Ser 3.08 (H) 0.44 - 1.00 mg/dL   Calcium 8.7 (L) 8.9 - 10.3 mg/dL   GFR, Estimated 57 (L) >60 mL/min    Comment: (NOTE) Calculated using the CKD-EPI Creatinine Equation (2021)    Anion gap 5 5 - 15    Comment: Performed at [facility], [address], [place], [place] [address]  Magnesium     Status: None   Collection Time:  04/01/22  4:38 AM  Result Value Ref Range   Magnesium 2.0 1.7 - 2.4 mg/dL    Comment: Performed at [facility], [address] [place], [place] [address]  Glucose, capillary     Status: Abnormal   Collection Time: 04/01/22  7:57 AM  Result Value Ref Range   Glucose-Capillary 105 (H) 70 - 99 mg/dL    Comment: Glucose reference range applies only to samples taken after fasting for at least 8 hours.  Glucose, capillary     Status: None   Collection Time: 04/01/22 11:01 AM  Result Value Ref Range   Glucose-Capillary 94 70 - 99 mg/dL    Comment: Glucose reference range applies only to samples taken after fasting for at least 8 hours.  Glucose, capillary     Status: None   Collection Time: 04/01/22  3:54 PM  Result Value Ref Range   Glucose-Capillary 99 70 - 99 mg/dL    Comment: Glucose reference range applies only to samples taken after fasting for at least 8 hours.  Glucose, capillary     Status: Abnormal   Collection Time: 04/01/22  9:14 PM  Result Value Ref Range   Glucose-Capillary 135 (H) 70 - 99 mg/dL    Comment: Glucose reference range applies only to samples taken after fasting for at least 8 hours.  Glucose, capillary      Status: None   Collection Time: 04/02/22  6:04 AM  Result Value Ref Range   Glucose-Capillary 97 70 - 99 mg/dL    Comment: Glucose reference range applies only to samples taken after fasting for at least 8 hours.  Glucose, capillary     Status: None   Collection Time: 04/02/22 11:50 AM  Result Value Ref Range   Glucose-Capillary 87 70 - 99 mg/dL    Comment: Glucose reference range applies only to samples taken after fasting for at least 8 hours.   Comment 1 Notify RN    Comment 2 Document in Chart     Current Facility-Administered Medications  Medication Dose Route Frequency Provider Last Rate Last Admin   (feeding supplement) PROSource Plus liquid 30 mL  30 mL Oral BID BM Yates Decamp, MD   30 mL at 04/02/22 1446   0.9 %  sodium chloride infusion  250 mL Intravenous PRN Yates Decamp, MD       acetaminophen (TYLENOL) tablet 650 mg  650 mg Oral Q6H PRN Yates Decamp, MD       Or   acetaminophen (TYLENOL) suppository 650 mg  650 mg Rectal Q6H PRN Yates Decamp, MD       apixaban Everlene Balls) tablet 5 mg  5 mg Oral BID Yates Decamp, MD   5 mg at 04/02/22 0908   famotidine (PEPCID) tablet 20 mg  20 mg Oral QHS PRN Yates Decamp, MD   20 mg at 03/31/22 2101   feeding supplement (ENSURE ENLIVE / ENSURE PLUS) liquid 237 mL  237 mL Oral BID BM Yates Decamp, MD   237 mL at 04/02/22 0914   furosemide (LASIX) tablet 20 mg  20 mg Oral Daily Yates Decamp, MD   20 mg at 04/02/22 0908   insulin aspart (novoLOG) injection 0-9 Units  0-9 Units Subcutaneous TID WC Yates Decamp, MD   2 Units at 03/31/22 1636   levothyroxine (SYNTHROID) tablet 25 mcg  25 mcg Oral Q0600 Yates Decamp, MD   25 mcg at 04/02/22 0541   midodrine (PROAMATINE) tablet 5 mg  5 mg Oral  TID WC Arrien, York Ram, MD   5 mg at 04/02/22 1038   multivitamin with minerals tablet 1 tablet  1 tablet Oral Daily Yates Decamp, MD   1 tablet at 03/31/22 7619   rosuvastatin (CRESTOR) tablet 5 mg  5 mg Oral Daily Yates Decamp, MD   5 mg at 04/02/22 5093   sildenafil  (REVATIO) tablet 20 mg  20 mg Oral TID Yates Decamp, MD   20 mg at 04/02/22 0908   sodium chloride flush (NS) 0.9 % injection 3 mL  3 mL Intravenous Q12H Yates Decamp, MD   3 mL at 04/02/22 0909   sodium chloride flush (NS) 0.9 % injection 3 mL  3 mL Intravenous PRN Yates Decamp, MD        Musculoskeletal: Strength & Muscle Tone:  not assessed Gait & Station: unable to stand Patient leans: N/A    Psychiatric Specialty Exam:  Presentation  General Appearance: Appropriate for Environment  Eye Contact:Good  Speech:Clear and Coherent; Normal Rate  Speech Volume:Normal  Handedness:Right   Mood and Affect  Mood:Euthymic  Affect:Appropriate   Thought Process  Thought Processes:Goal Directed; Linear  Descriptions of Associations:Intact  Orientation:Full (Time, Place and Person)  Thought Content:Logical  History of Schizophrenia/Schizoaffective disorder:No data recorded Duration of Psychotic Symptoms:No data recorded Hallucinations:Hallucinations: None  Ideas of Reference:None  Suicidal Thoughts:Suicidal Thoughts: No  Homicidal Thoughts:Homicidal Thoughts: No   Sensorium  Memory:Immediate Good; Recent Good; Remote Good  Judgment:Intact  Insight:Fair   Executive Functions  Concentration:Good  Attention Span:Good  Recall:Good  Fund of Knowledge:Good  Language:Good   Psychomotor Activity  Psychomotor Activity:Psychomotor Activity: Normal   Assets  Assets:Communication Skills   Sleep  Sleep:Sleep: Fair   Physical Exam: Physical Exam Review of Systems  Psychiatric/Behavioral:  Negative for depression, hallucinations, memory loss, substance abuse and suicidal ideas. The patient is not nervous/anxious and does not have insomnia.    Blood pressure (!) 118/55, pulse (!) 54, temperature 97.8 F (36.6 C), temperature source Oral, resp. rate 15, height 5\' 8"  (1.727 m), weight 91.2 kg, SpO2 93 %. Body mass index is 30.57 kg/m.  Treatment Plan  Summary: 76 year old female who denies prior history of mental illness. She demonstrate understanding of information presented to her regarding her health and consequences of being at home with limited medical resources which include rehospitalization and possibly death. Patient is able to verbalize the alternative treatment such as family help and home health aide if she qualifies.Patient is firm in her decision to be discharged home, says she makes her decision based on her personal believe that going to rehab will not help her but says she will get better care form her family and friends. Based on my evaluation today, patient has Capacity to make informed medical decision. Patient scored 27/30 on mini-mental state examination-could be normal.  Recommendations: -Please be aware that Capacity to make decision can change from time to time, pt may need to be referred for competency determination(legal) if her ability to make medical decision deteriorates -Social worker consult to explore of health aide assistance   Disposition:  Psychiatric service signing out. Re-consult as needed  61, MD 04/02/2022 2:47 PM

## 2022-04-02 NOTE — Progress Notes (Signed)
Progress Note   Patient: Ashley [name] ZOX:[id number] DOB: 1946-05-30 DOA: 03/26/2022     7 DOS: the patient was seen and examined on 04/02/2022   Brief hospital course: Ashley [name] was admitted to the hospital with the working diagnosis of decompensated heart failure.   76 yo female with the past medical history of hypertension, CVA, T2DM, Coronary artery disease, pulmonary embolism, hypothyroid and paroxysmal atrial fibrillation who presented with lower extremity edema and ambulatory dysfunction. She was discharged from [facility], requiring assistance to most activities of daily life. On her initial physical examination her blood pressure was 102/61, HR 52, RR 20 and 02 saturation 96% on room air. Heart with S1 and S2 irregularly irregular, lungs with decreased breath sounds more on right lower lobe, abdomen not distended and 3+ pitting bilateral lower extremity edema.   Na 140, K 2,7 CL 100 bicarbonate 27 glucose 100 bun 10 cr 1,13 BNP 1,904  Wbc 7,3 hgb 12,3 plt 197  [place] SG 1,006, 6-10 wbc   CT head no acute changes.   Chest radiograph right rotation, with cardiomegaly, right loculated pleural effusion.   EKG 110 bpm, normal axis, normal qtc, atrial fibrillation rhythm, with no significant ST segment changes, lead II, III, AvF, V4 to V6 T wave inversion.   Patient was placed on furosemide for diuresis.  Echocardiogram with severe RV dysfunction.   07/13 Patient had direct current cardioversion.  07/14 bradycardia amiodarone and metoprolol were held.   Psychiatry consulted as second opinion for competency. Plan for patient to go home, she continue to decline SNF/ rehab.   Assessment and Plan: * Acute on chronic systolic (congestive) heart failure (HCC) Echocardiogram with LV systolic function reduced to 40 to 45%, septal hypokinesis. Moderate LVH, interventricular  Septum is flattened in systolic and diastole. Severe reduction in RV systolic function. RVSP 45.5 mmHg. LA severe  dilatation. Small pericardial effusion. Moderate TR.   Acute on chronic core pulmonale. Pulmonary hypertension.   Blood pressure 110 to 120 mmHg.  Continue midodrine for blood pressure support.  Continue diuresis with furosemide.   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  Continue with sildenafil.  Bilateral unna boots with improvement of peripheral edema.    AKI (acute kidney injury) (HCC) Hypokalemia. Hypomagnesemia   His renal function has been stable, will follow up renal function in am.  Continue diuresis with furosemide along with Kcl supplementation.   Pleural effusion, right Loculated chronic right pleural effusion.  Continue diuresis as tolerated.   Pulmonary embolus (HCC) Hx of submassive PE and chronic DVT in 06/2021 Continue eliquis   Paroxysmal atrial fibrillation (HCC) 07/13 Sp direct current cardioversion   Positive bradycardia, telemetry personally reviewed, HR in the 50's. Sinus with sinus arrhythmia with PAC and multifocal atrial rhythm.  Continue to hold on metoprolol and amiodarone.  Anticoagulation with apixaban.   Controlled diabetes mellitus type II without complication (HCC) A1C in 2022 was 6.2, update now Continue glucose cover and monitoring with insulin sliding scale.   Essential hypertension Improved blood pressure with midodrine Discontinue metoprolol due to bradycardia.   History of stroke Continue apixaban and statin therapy.   Coronary artery disease Recent LHC with no obstructive disease Continue statin/eliquis  Hypothyroidism Relatively new diagnosis Continue home synthroid   Gastroesophageal reflux disease Continue pepcid PRN, no longer on chronic PPI therapy   Pressure ulcer Secondary to being bed bound Turn patient Wound care  Stage 2 coccygeal (present on  admission).            Subjective: Patient with no chest pain or dyspnea,   Physical  Exam: Vitals:   04/02/22 0418 04/02/22 0600 04/02/22 0700 04/02/22 0800  BP: 109/60 (!) 117/58 (!) 113/49 (!) 121/50  Pulse: (!) 47  (!) 54   Resp: 20 16 16 17   Temp: 97.8 F (36.6 C)     TempSrc: Oral     SpO2: 98%  98% 93%  Weight: 91.2 kg     Height:       Neurology awake and alert ENT With no pallor Cardiovascular with S1 and S2 present and rhythmic Respiratory with no rales or wheezing Abdomen not distended Positive trace lower extremity edema, wraps in place.  Data Reviewed:    Family Communication: no family at the bedside   Disposition: Status is: Inpatient Remains inpatient appropriate because: possible dc home in the next 48 hrs   Planned Discharge Destination: Home      Author: , MD 04/02/2022 1:44 PM  For on call review [email or web address].

## 2022-04-02 NOTE — Progress Notes (Signed)
Pt. C/o gas. Notified Dr. Margo Aye and received order for simethicone.

## 2022-04-03 DIAGNOSIS — I5023 Acute on chronic systolic (congestive) heart failure: Secondary | ICD-10-CM | POA: Diagnosis not present

## 2022-04-03 DIAGNOSIS — N179 Acute kidney failure, unspecified: Secondary | ICD-10-CM | POA: Diagnosis not present

## 2022-04-03 DIAGNOSIS — I480 Paroxysmal atrial fibrillation: Secondary | ICD-10-CM | POA: Diagnosis not present

## 2022-04-03 DIAGNOSIS — J90 Pleural effusion, not elsewhere classified: Secondary | ICD-10-CM | POA: Diagnosis not present

## 2022-04-03 LAB — GLUCOSE, CAPILLARY
Glucose-Capillary: 87 mg/dL (ref 70–99)
Glucose-Capillary: 105 mg/dL — ABNORMAL HIGH (ref 70–99)
Glucose-Capillary: 95 mg/dL (ref 70–99)
Glucose-Capillary: 83 mg/dL (ref 70–99)

## 2022-04-03 LAB — BASIC METABOLIC PANEL
Anion gap: 7 (ref 5–15)
BUN: 19 mg/dL (ref 8–23)
CO2: 25 mmol/L (ref 22–32)
Calcium: 8.6 mg/dL — ABNORMAL LOW (ref 8.9–10.3)
Chloride: 104 mmol/L (ref 98–111)
Creatinine, Ser: 1.04 mg/dL — ABNORMAL HIGH (ref 0.44–1.00)
GFR, Estimated: 56 mL/min — ABNORMAL LOW (ref >60)
Glucose, Bld: 84 mg/dL (ref 70–99)
Potassium: 4 mmol/L (ref 3.5–5.1)
Sodium: 136 mmol/L (ref 135–145)

## 2022-04-03 NOTE — Progress Notes (Signed)
I spoke with Mr. Ashley [name], Mrs Ashley [name] son and talked about she being competent to make decisions for her own. She is willing to go home. Plan to check if Medicaid will be able to approve further help at home.  Will see if we can add palliative care or hospice.

## 2022-04-03 NOTE — Progress Notes (Signed)
Progress Note   Patient: Ashley [name] NLZ:[id number] DOB: 18-May-1946 DOA: 03/26/2022     8 DOS: the patient was seen and examined on 04/03/2022   Brief hospital course: [name] was admitted to the hospital with the working diagnosis of decompensated heart failure.   76 yo female with the past medical history of hypertension, CVA, T2DM, Coronary artery disease, pulmonary embolism, hypothyroid and paroxysmal atrial fibrillation who presented with lower extremity edema and ambulatory dysfunction. She was discharged from [facility] 09/2021, requiring assistance to most activities of daily life. On her initial physical examination her blood pressure was 102/61, HR 52, RR 20 and 02 saturation 96% on room air. Heart with S1 and S2 irregularly irregular, lungs with decreased breath sounds more on right lower lobe, abdomen not distended and 3+ pitting bilateral lower extremity edema.   Na 140, K 2,7 CL 100 bicarbonate 27 glucose 100 bun 10 cr 1,13 BNP 1,904  Wbc 7,3 hgb 12,3 plt 197  [place] SG 1,006, 6-10 wbc   CT head no acute changes.   Chest radiograph right rotation, with cardiomegaly, right loculated pleural effusion.   EKG 110 bpm, normal axis, normal qtc, atrial fibrillation rhythm, with no significant ST segment changes, lead II, III, AvF, V4 to V6 T wave inversion.   Patient was placed on furosemide for diuresis.  Echocardiogram with severe RV dysfunction.   07/13 Patient had direct current cardioversion.  07/14 bradycardia amiodarone and metoprolol were held.   Psychiatry consulted as second opinion for competency. Plan for patient to go home, she continue to decline SNF/ rehab.   Assessment and Plan: * Acute on chronic systolic (congestive) heart failure (HCC) Echocardiogram with LV systolic function reduced to 40 to 45%, septal hypokinesis. Moderate LVH, interventricular  Septum is flattened in systolic and diastole. Severe reduction in RV systolic function. RVSP 45.5 mmHg. LA severe  dilatation. Small pericardial effusion. Moderate TR.   Acute on chronic core pulmonale. Pulmonary hypertension.   Blood pressure 110 to 120 mmHg.  Continue midodrine for blood pressure support.  Continue diuresis with furosemide.   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  Continue with sildenafil.  Bilateral unna boots with improvement of peripheral edema.    AKI (acute kidney injury) (HCC) Hypokalemia. Hypomagnesemia   His renal function has been stable, will follow up renal function in am.  Continue diuresis with furosemide along with Kcl supplementation.   Pleural effusion, right Loculated chronic right pleural effusion.  Continue diuresis as tolerated.   Pulmonary embolus (HCC) Hx of submassive PE and chronic DVT in 06/2021 Continue eliquis   Paroxysmal atrial fibrillation (HCC) 07/13 Sp direct current cardioversion   Positive bradycardia, telemetry personally reviewed, HR in the 50's. Sinus with sinus arrhythmia with PAC and multifocal atrial rhythm.  Continue to hold on metoprolol and amiodarone.  Anticoagulation with apixaban.   Controlled diabetes mellitus type II without complication (HCC) A1C in 2022 was 6.2, update now Continue glucose cover and monitoring with insulin sliding scale.   Essential hypertension Improved blood pressure with midodrine Discontinue metoprolol due to bradycardia.   History of stroke Continue apixaban and statin therapy.   Coronary artery disease Recent LHC with no obstructive disease Continue statin/eliquis  Hypothyroidism Relatively new diagnosis Continue home synthroid   Gastroesophageal reflux disease Continue pepcid PRN, no longer on chronic PPI therapy   Pressure ulcer Secondary to being bed bound Turn patient Wound care  Stage 2 coccygeal (present on  admission).            Subjective: Patient feeling better, no dyspnea or chest pain.   Physical  Exam: Vitals:   04/03/22 0400 04/03/22 0600 04/03/22 0739 04/03/22 1100  BP: (!) 100/53 (!) 120/59 118/71 (!) 113/55  Pulse: (!) 56  67 63  Resp: 16 13 19  (!) 22  Temp: 98.5 F (36.9 C)  98.4 F (36.9 C) 98.1 F (36.7 C)  TempSrc: Oral  Oral Oral  SpO2: 100%  95%   Weight: 88.1 kg     Height:       Neurology awake and alert ENT no pallor Cardiovascular S1 and S2 present and rhythmic Respiratory with rales and wheezing Abdomen soft and not distended Lower extremities with trace edema.  Data Reviewed:   Family Communication: I spoke over the phone with the patient's son about patient's  condition, plan of care, prognosis and all questions were addressed.   Disposition: Status is: Inpatient Remains inpatient appropriate because: pending discharge home   Planned Discharge Destination: Home  Author: , MD 04/03/2022 11:26 AM  For on call review [email or web address].

## 2022-04-03 NOTE — Progress Notes (Addendum)
CCMD called RN to report patient had gone into SVT. Upon going into room, pt's HR in the 60s. RN asked patient if she felt okay, she said yes but she needed to be repositioned d/t sacral wound. RN checked wound and repositioned patient. HR remained in the 60s while RN was in room. Upon leaving patient's room, RN went to the monitor at the nurse's station to check patient's rhythm. Upon reaching nurse's station, patient's HR was in 170s and CCMD called RN again to say patient was in SVT again. Upon returning to patient's room, patient stated no chest pain or shortness of breath. HR was back down to 60s. MD was paged. Charge RN came to assess situation. EKG completed and in chart. At this time, HR is 60. Patient is resting with family member at bedside. Call bell is within reach.

## 2022-04-04 DIAGNOSIS — J90 Pleural effusion, not elsewhere classified: Secondary | ICD-10-CM | POA: Diagnosis not present

## 2022-04-04 DIAGNOSIS — I5023 Acute on chronic systolic (congestive) heart failure: Secondary | ICD-10-CM | POA: Diagnosis not present

## 2022-04-04 DIAGNOSIS — N179 Acute kidney failure, unspecified: Secondary | ICD-10-CM | POA: Diagnosis not present

## 2022-04-04 DIAGNOSIS — I480 Paroxysmal atrial fibrillation: Secondary | ICD-10-CM | POA: Diagnosis not present

## 2022-04-04 LAB — GLUCOSE, CAPILLARY
Glucose-Capillary: 77 mg/dL (ref 70–99)
Glucose-Capillary: 121 mg/dL — ABNORMAL HIGH (ref 70–99)
Glucose-Capillary: 94 mg/dL (ref 70–99)
Glucose-Capillary: 138 mg/dL — ABNORMAL HIGH (ref 70–99)

## 2022-04-04 LAB — CBC
HCT: 29.1 % — ABNORMAL LOW (ref 36.0–46.0)
Hemoglobin: 10 g/dL — ABNORMAL LOW (ref 12.0–15.0)
MCH: 25.1 pg — ABNORMAL LOW (ref 26.0–34.0)
MCHC: 34.4 g/dL (ref 30.0–36.0)
MCV: 72.9 fL — ABNORMAL LOW (ref 80.0–100.0)
Platelets: 181 10*3/uL (ref 150–400)
RBC: 3.99 MIL/uL (ref 3.87–5.11)
RDW: 18 % — ABNORMAL HIGH (ref 11.5–15.5)
WBC: 5.4 10*3/uL (ref 4.0–10.5)
nRBC: 0 % (ref 0.0–0.2)

## 2022-04-04 LAB — BASIC METABOLIC PANEL
Anion gap: 7 (ref 5–15)
BUN: 15 mg/dL (ref 8–23)
CO2: 26 mmol/L (ref 22–32)
Calcium: 8.6 mg/dL — ABNORMAL LOW (ref 8.9–10.3)
Chloride: 106 mmol/L (ref 98–111)
Creatinine, Ser: 1.02 mg/dL — ABNORMAL HIGH (ref 0.44–1.00)
GFR, Estimated: 57 mL/min — ABNORMAL LOW (ref >60)
Glucose, Bld: 76 mg/dL (ref 70–99)
Potassium: 3.9 mmol/L (ref 3.5–5.1)
Sodium: 139 mmol/L (ref 135–145)

## 2022-04-04 LAB — MAGNESIUM: Magnesium: 1.8 mg/dL (ref 1.7–2.4)

## 2022-04-04 MED ORDER — POTASSIUM CHLORIDE CRYS ER 10 MEQ PO TBCR
10.0000 meq | EXTENDED_RELEASE_TABLET | Freq: Every day | ORAL | Status: DC
  Administered 2022-04-04 – 2022-04-05 (×2): 10 meq via ORAL
  Filled 2022-04-04 (×2): qty 1

## 2022-04-04 MED ORDER — AMIODARONE HCL 200 MG PO TABS
200.0000 mg | ORAL_TABLET | Freq: Every day | ORAL | Status: DC
Start: 2022-04-04 — End: 2022-04-05
  Administered 2022-04-04 – 2022-04-05 (×2): 200 mg via ORAL
  Filled 2022-04-04 (×2): qty 1

## 2022-04-04 NOTE — Progress Notes (Addendum)
Nutrition Follow-up  DOCUMENTATION CODES:   Obesity unspecified  INTERVENTION:   Pt with very poor intake; liberalize diet to Regular. If PO intake dramatically increases can reinstate restrictions.   Continue Ensure Enlive BID  Continue ProSource Plus BID (pt consumes mixed in juice)  Continue MVI with minerals   NUTRITION DIAGNOSIS:   Inadequate oral intake related to decreased appetite as evidenced by per patient/family report, meal completion < 50%. Ongoing.   GOAL:   Patient will meet greater than or equal to 90% of their needs Not met  MONITOR:   PO intake, Supplement acceptance, Diet advancement, Labs, Weight trends, I & O's  REASON FOR ASSESSMENT:   Consult Wound healing (Stage 2 pressure injury to sacrum)  ASSESSMENT:   Pt from home with leg swelling and ambulatory dysfunction, found to have acute on chronic CHF. PMH significant for CVA, Z6OQ, CAD, systolic CHF, hx of PE, PAD on Eliquis, hypothyroidism and GERD.  Pt reports poor appetite Meal Completion: 10-50%    Pt takes ProSource Plus in juice. Does not love ensure but will try to drink due to poor intake.   Pt with Afib today and started on amiodarone Hopefully home with help soon.   Medications reviewed and include: lasix, SSI, synthroid, MVI with minerals daily KCl  Labs reviewed:  CBG's: 83-105   Diet Order:   Diet Order             Diet Heart Room service appropriate? Yes; Fluid consistency: Thin  Diet effective now                   EDUCATION NEEDS:   Education needs have been addressed  Skin:  Skin Assessment: Skin Integrity Issues: Skin Integrity Issues:: Stage II Stage II: coccyx  Last BM:  7/8  Height:   Ht Readings from Last 1 Encounters:  03/31/22 '5\' 8"'  (1.727 m)    Weight:   Wt Readings from Last 1 Encounters:  04/04/22 90 kg    Ideal Body Weight:  63.6 kg  BMI:  Body mass index is 30.17 kg/m.  Estimated Nutritional Needs:   Kcal:   1700-1900  Protein:  85-100g  Fluid:  >/=1.7L  Bre Pecina P., RD, LDN, CNSC See AMiON for contact information

## 2022-04-04 NOTE — TOC Progression Note (Addendum)
Transition of Care [facility]) - Progression Note    Patient Details  Name: Ashley [name] MRN: [id number] Date of Birth: Aug 12, 1946  Transition of Care [facility]) CM/SW Contact  Leone Haven, RN Phone Number: 04/04/2022, 12:43 PM  Clinical Narrative:    NCM made referral to Tresa Endo with [facility] for [facility], HHAIDE and Social Worker, she states she can take the referral , will see how they can best help this patient.  Plan will be for dc tomorrow, , NCM has arranged transport by [facility] [employer] for 4pm tomorrow.  MD has spoken to son today to inform him of the dc tomorrow and he states son is ok with her being dc tomorrow.     Barriers to Discharge: Continued Medical Work up  Expected Discharge Plan and Services   In-house Referral: Clinical Social Work Discharge Planning Services: CM Consult   Living arrangements for the past 2 months: Single Family Home                                       Social Determinants of Health (SDOH) Interventions    Readmission Risk Interventions     No data to display

## 2022-04-04 NOTE — Progress Notes (Signed)
Physical Therapy Treatment Patient Details Name: Ashley [name] MRN: [id number] DOB: 04-28-1946 Today's Date: 04/04/2022   History of Present Illness 76 yo admitted 7/8 with edema and CHF with noted sacral wound. PMhx: CHF, CAD, GERD, hypothyroidism, DVT, AFib, HTN, DM, FTT    PT Comments    Patient progressing very slowly towards PT goals. Session focused on functional standing and transfer OOB. Pt requires Max A of 2 for bed mobility and standing upright (half way) x1, otherwise total A of 2 for all standing attempts. Utilized maxi move to transfer to chair after not able to utilize stedy due to issues with problem solving and fatigue. HR ranging from 80-171 bpm, A-fib during session. Able to sit EOB ~20 mins with Min guard for safety. Aware that pt wants to d/c home, however this continues to be an unsafe d/c plan due to lack of support at home and decreased ability to mobilize. Will follow.   Recommendations for follow up therapy are one component of a multi-disciplinary discharge planning process, led by the attending physician.  Recommendations may be updated based on patient status, additional functional criteria and insurance authorization.  Follow Up Recommendations  Long-term institutional care without follow-up therapy Can patient physically be transported by private vehicle: No   Assistance Recommended at Discharge Frequent or constant Supervision/Assistance  Patient can return home with the following Two people to help with walking and/or transfers;A lot of help with bathing/dressing/bathroom;Assistance with feeding;Assistance with cooking/housework;Direct supervision/assist for financial management;Direct supervision/assist for medications management   Equipment Recommendations  Hospital bed;Wheelchair (measurements PT);Other (comment)    Recommendations for Other Services       Precautions / Restrictions Precautions Precautions: Fall Precaution Comments: sacral  wound, watch HR Restrictions Weight Bearing Restrictions: No     Mobility  Bed Mobility Overal bed mobility: Needs Assistance Bed Mobility: Rolling, Supine to Sit Rolling: Min assist   Supine to sit: Max assist, +2 for physical assistance Sit to supine: Mod assist, HOB elevated   General bed mobility comments: ASsist with LEs and trunk to get to EOB, assist to bring LLE into bed to return to supine.    Transfers Overall transfer level: Needs assistance Equipment used: Rolling walker (2 wheels), Ambulation equipment used Transfers: Sit to/from Stand Sit to Stand: Max assist, +2 physical assistance, Total assist, From elevated surface           General transfer comment: Attempted standing with use of RW, able to get 1/2 of the way upright with cues for hand/foot placement and technique, posterior lean, performed x2, utilized stedy to attempt standing again x2 however unable to clear bottom from bed as pt with difficulty understanding how to pull up on bar. Finally used maxi move for total A tx to chair.    Ambulation/Gait               General Gait Details: Unable   Stairs             Wheelchair Mobility    Modified Rankin (Stroke Patients Only)       Balance Overall balance assessment: Needs assistance Sitting-balance support: No upper extremity supported, Feet supported Sitting balance-Leahy Scale: Fair Sitting balance - Comments: She was able to sit EOB~20 minutes total with min guard A between trying RW and sara stedy and then changing to maxi move, when fatigued, pt with LOB towards right Postural control: Right lateral lean Standing balance support: During functional activity Standing balance-Leahy Scale: Zero Standing balance comment: Able to  get halfway standing with MAx A of 2.                            Cognition Arousal/Alertness: Awake/alert Behavior During Therapy: WFL for tasks assessed/performed Overall Cognitive Status:  Impaired/Different from baseline Area of Impairment: Following commands, Safety/judgement, Awareness, Problem solving                       Following Commands: Follows one step commands inconsistently Safety/Judgement: Decreased awareness of safety, Decreased awareness of deficits Awareness: Intellectual Problem Solving: Slow processing, Decreased initiation, Difficulty sequencing, Requires verbal cues, Requires tactile cues General Comments: Reports grogginess upon arrival after noting it was PT/OT- "the RN gave me something to make me lethargic" despite not having any sedative meds. Difficulty understanding instructions to use stedy witih push/pull mechanism. TAngential at times. Really poor historian and unable to answer questions relating to how long it has been since she walked/transferred etc.        Exercises General Exercises - Lower Extremity Long Arc Quad: AROM, Both, 5 reps, Seated    General Comments General comments (skin integrity, edema, etc.): HR ranging from 80-171 bpm, A-fib.      Pertinent Vitals/Pain Pain Assessment Pain Assessment: No/denies pain    Home Living Family/patient expects to be discharged to:: Private residence Living Arrangements: Children Available Help at Discharge: Available PRN/intermittently Type of Home: House Home Access: Level entry                Prior Function            PT Goals (current goals can now be found in the care plan section) Progress towards PT goals: Progressing toward goals (slowly)    Frequency    Min 2X/week      PT Plan Current plan remains appropriate    Co-evaluation PT/OT/SLP Co-Evaluation/Treatment: Yes Reason for Co-Treatment: For patient/therapist safety;To address functional/ADL transfers PT goals addressed during session: Mobility/safety with mobility;Balance;Strengthening/ROM OT goals addressed during session: Strengthening/ROM      AM-PAC PT "6 Clicks" Mobility   Outcome  Measure  Help needed turning from your back to your side while in a flat bed without using bedrails?: A Little Help needed moving from lying on your back to sitting on the side of a flat bed without using bedrails?: A Lot Help needed moving to and from a bed to a chair (including a wheelchair)?: Total Help needed standing up from a chair using your arms (e.g., wheelchair or bedside chair)?: Total Help needed to walk in hospital room?: Total Help needed climbing 3-5 steps with a railing? : Total 6 Click Score: 9    End of Session Equipment Utilized During Treatment: Gait belt Activity Tolerance: Patient limited by fatigue;Patient tolerated treatment well Patient left: in chair;with call bell/phone within reach;with chair alarm set Nurse Communication: Mobility status;Need for lift equipment PT Visit Diagnosis: Other abnormalities of gait and mobility (R26.89);Muscle weakness (generalized) (M62.81);Difficulty in walking, not elsewhere classified (R26.2)     Time: 3500-9381 PT Time Calculation (min) (ACUTE ONLY): 38 min  Charges:  $Therapeutic Activity: 23-37 mins                     Vale Haven, PT, DPT Acute Rehabilitation Services Secure chat preferred Office [phone]      Blake Divine A Alyda Megna 04/04/2022, 12:08 PM

## 2022-04-04 NOTE — Progress Notes (Addendum)
Progress Note   Patient: Ashley [name] IOX:[id number] DOB: [birth year] DOA: 03/26/2022     9 DOS: the patient was seen and examined on 04/04/2022   Brief hospital course: Mrs. Cuccia was admitted to the hospital with the working diagnosis of decompensated heart failure.  Hospitalization complicated with atrial fibrillation, and difficulty in discharge planing.   76 yo female with the past medical history of hypertension, CVA, T2DM, Coronary artery disease, pulmonary embolism, hypothyroid and paroxysmal atrial fibrillation who presented with lower extremity edema and ambulatory dysfunction. She was discharged [facility] 09/[facility], requiring assistance to most activities of daily life. On her initial physical examination her blood pressure was 102/61, HR 52, RR 20 and 02 saturation 96% on room air. Heart with S1 and S2 irregularly irregular, lungs with decreased breath sounds more on right lower lobe, abdomen not distended and 3+ pitting bilateral lower extremity edema.   Na 140, K 2,7 CL 100 bicarbonate 27 glucose 100 bun 10 cr 1,13 BNP 1,904  Wbc 7,3 hgb 12,3 plt 197  [place] SG 1,006, 6-10 wbc   CT head no acute changes.   Chest radiograph right rotation, with cardiomegaly, right loculated pleural effusion.   EKG 110 bpm, normal axis, normal qtc, atrial fibrillation rhythm, with no significant ST segment changes, lead II, III, AvF, V4 to V6 T wave inversion.   Patient was placed on furosemide for diuresis.  Echocardiogram with severe RV dysfunction.   07/13 Patient had direct current cardioversion.  07/14 bradycardia amiodarone and metoprolol were held.   Psychiatry consulted as second opinion for competency. Plan for patient to go home, she continue to decline SNF/ rehab.   07/17 with episodic atrial fibrillation with RVR, resumed amiodarone today. Plant to continue telemetry monitoring for 24 hrs if now further arrhythmia plan to discharge home with home health services.   Assessment  and Plan: * Acute on chronic systolic (congestive) heart failure (HCC) Echocardiogram with LV systolic function reduced to 40 to 45%, septal hypokinesis. Moderate LVH, interventricular  Septum is flattened in systolic and diastole. Severe reduction in RV systolic function. RVSP 45.5 mmHg. LA severe dilatation. Small pericardial effusion. Moderate TR.   Acute on chronic core pulmonale. Pulmonary hypertension.   Blood pressure 110 to 120 mmHg.  Continue midodrine for blood pressure support.  Continue diuresis with furosemide.   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  Continue with sildenafil.  Bilateral unna boots with improvement of peripheral edema.    AKI (acute kidney injury) (HCC) Hypokalemia. Hypomagnesemia   Renal function continue to be stable, with serum cr at 1,0 with K at 3,9 and serum bicarbonate at 26. Plan to continue diuresis and Kcl supplementation.  Follow up renal function as outpatient.   Pleural effusion, right Loculated chronic right pleural effusion.  Continue diuresis as tolerated.   Pulmonary embolus (HCC) Hx of submassive PE and chronic DVT in 06/2021 Continue eliquis   Paroxysmal atrial fibrillation (HCC) Atypical atrial flutter.  07/13 Sp direct current cardioversion   Patient had recurrent episodes of atrial fibrillation, now HR has improved back to sinus with sinus arrhythmia. Plan to resume amiodarone 200 mg daily and continue telemetry monitoring. Continue anticoagulation with apixaban. If no further arrhythmias plan to discharge her home tomorrow.   Controlled diabetes mellitus type II without complication (HCC) A1C in 2022 was 6.2, update now Continue glucose cover and monitoring with insulin sliding scale.   Essential hypertension Improved blood pressure with  midodrine Discontinue metoprolol due to bradycardia.  Continue telemetry monitoring.    History of stroke Continue  apixaban and statin therapy.   Coronary artery disease Recent LHC with no obstructive disease Continue statin/eliquis  Hypothyroidism Relatively new diagnosis Continue home synthroid   Gastroesophageal reflux disease Continue pepcid PRN, no longer on chronic PPI therapy   Pressure ulcer Secondary to being bed bound Turn patient Wound care  Stage 2 coccygeal (present on admission).          Subjective: Patient with no chest pain or dyspnea, no nausea or vomiting.   Physical Exam: Vitals:   04/04/22 0800 04/04/22 0915 04/04/22 1000 04/04/22 1200  BP: 127/88 118/66 102/62 120/79  Pulse: 84  73 (!) 52  Resp: (!) 23 17 16 20   Temp:    (!) 97.3 F (36.3 C)  TempSrc:    Oral  SpO2: (!) 83%  97% 97%  Weight:      Height:       Neurology awake and alert ENT with mild pallor Cardiovascular with S1 and S2 present, positive premature beats. No murmurs or rubs Respiratory with no rales or rhonchi Abdomen not distended No lower extremity edema  Data Reviewed:    Family Communication: I spoke over the phone with the patient's son about patient's  condition, plan of care, prognosis and all questions were addressed.   Disposition: Status is: Inpatient Remains inpatient appropriate because: 24 hrs telemetry monitoring   Planned Discharge Destination: Home     Author: , MD 04/04/2022 2:25 PM  For on call review [email or web address]/[email or web address].

## 2022-04-04 NOTE — Progress Notes (Signed)
Occupational Therapy Treatment Patient Details Name: Ashley [name] MRN: [id number] DOB: [birth year] Today's Date: 04/04/2022   History of present illness 76 yo admitted 7/8 with edema and CHF with noted sacral wound. PMhx: CHF, CAD, GERD, hypothyroidism, DVT, AFib, HTN, DM, FTT   OT comments  This 76 yo female admitted with above seen with PT today to see if we could progress mobility. She still needs extensive A for all mobility (bed and transfers--had to use Maxi move today for OOB). This increased A needed for mobility plays into increased need for A for all basic ADLs. I am aware she wants to go home, but with her limited mobility this is not a safe discharge plan for her. We will continue to follow.   Recommendations for follow up therapy are one component of a multi-disciplinary discharge planning process, led by the attending physician.  Recommendations may be updated based on patient status, additional functional criteria and insurance authorization.    Follow Up Recommendations  Skilled nursing-short term rehab (<3 hours/day)    Assistance Recommended at Discharge Frequent or constant Supervision/Assistance  Patient can return home with the following  Two people to help with walking and/or transfers;A lot of help with bathing/dressing/bathroom;Assistance with cooking/housework;Assist for transportation;Direct supervision/assist for financial management;Direct supervision/assist for medications management;Help with stairs or ramp for entrance   Equipment Recommendations  BSC/3in1       Precautions / Restrictions Precautions Precautions: Fall Precaution Comments: sacral wound, watch HR Restrictions Weight Bearing Restrictions: No       Mobility Bed Mobility Overal bed mobility: Needs Assistance Bed Mobility: Rolling, Supine to Sit Rolling: Min assist   Supine to sit: Max assist, +2 for physical assistance     General bed mobility comments: A for legs and trunk as  well as to scoot forward to EOB    Transfers Overall transfer level: Needs assistance   Transfers: Sit to/from Stand             General transfer comment: For sit<>stand with RW from raised bed she was Max A +2 with getting ~1/2 to full standing then just sat down. Then tried sara stedy but pt was unable to problem solve how to stand up with it (she was pushing instead of pulling on stand up bar). Had to end up going to maxi move to get from bed to chair     Balance Overall balance assessment: Needs assistance Sitting-balance support: No upper extremity supported, Feet supported Sitting balance-Leahy Scale: Fair Sitting balance - Comments: She was able to sit EOB~20 minutes total with min guard A between trying RW and sara stedy and then changing to maxi move                                   ADL either performed or assessed with clinical judgement   ADL Overall ADL's : Needs assistance/impaired                         Toilet Transfer: Total assistance Toilet Transfer Details (indicate cue type and reason): unable to get to full standing with RW or sara stedy so had to go with maxi move for transfer to recliner (simulated as 3n1)                Extremity/Trunk Assessment Upper Extremity Assessment Upper Extremity Assessment: Generalized weakness  Vision Patient Visual Report: No change from baseline            Cognition Arousal/Alertness: Awake/alert ("acted" groggy/sleepy after I told her who we were and what we were to help her do; said the RN had given her something that made her groggy however she had only had 2 meds prior to [place] seeing her and they were not sedating ones.) Behavior During Therapy: WFL for tasks assessed/performed Overall Cognitive Status: Impaired/Different from baseline Area of Impairment: Following commands, Safety/judgement, Awareness, Problem solving                       Following Commands:  Follows one step commands inconsistently Safety/Judgement: Decreased awareness of safety, Decreased awareness of deficits Awareness: Intellectual Problem Solving: Slow processing, Decreased initiation, Difficulty sequencing, Requires verbal cues, Requires tactile cues General Comments: Pt did good with trying to stand with RW first attempt but could not maintain so we switched to sara stedy and she would push instead of pull to try to stand              General Comments HR ranged from 80-171 (Afib)    Pertinent Vitals/ Pain       Pain Assessment Pain Assessment: No/denies pain  Home Living Family/patient expects to be discharged to:: Private residence Living Arrangements: Children Available Help at Discharge: Available PRN/intermittently Type of Home: House Home Access: Level entry                                    Frequency  Min 2X/week        Progress Toward Goals  OT Goals(current goals can now be found in the care plan section)  Progress towards OT goals: Progressing toward goals (at least OOB today (maxi move) and she did participate wtih therapy)  Acute Rehab OT Goals Patient Stated Goal: to go home OT Goal Formulation: With patient Time For Goal Achievement: 04/11/22 Potential to Achieve Goals: Fair  Plan Discharge plan remains appropriate    Co-evaluation    PT/OT/SLP Co-Evaluation/Treatment: Yes Reason for Co-Treatment: For patient/therapist safety PT goals addressed during session: Mobility/safety with mobility;Balance;Strengthening/ROM OT goals addressed during session: Strengthening/ROM      AM-PAC OT "6 Clicks" Daily Activity     Outcome Measure   Help from another person eating meals?: A Lot Help from another person taking care of personal grooming?: A Lot Help from another person toileting, which includes using toliet, bedpan, or urinal?: Total Help from another person bathing (including washing, rinsing, drying)?: A Lot Help  from another person to put on and taking off regular upper body clothing?: A Lot Help from another person to put on and taking off regular lower body clothing?: Total 6 Click Score: 10    End of Session Equipment Utilized During Treatment: Gait belt;Rolling walker (2 wheels) (sara stedy, maxi move)  OT Visit Diagnosis: Unsteadiness on feet (R26.81);Muscle weakness (generalized) (M62.81);Other symptoms and signs involving cognitive function   Activity Tolerance Patient tolerated treatment well (more "awake" at end of session)   Patient Left in chair;with call bell/phone within reach;with chair alarm set   Nurse Communication Mobility status        Time: 7741-2878 OT Time Calculation (min): 45 min  Charges: OT General Charges $OT Visit: 1 Visit OT Treatments $Self Care/Home Management : 8-22 mins  Ignacia Palma, OTR/L Acute [employer] [facility] [phone] Office [phone]  Evette Georges 04/04/2022, 11:12 AM

## 2022-04-04 NOTE — TOC Transition Note (Addendum)
Transition of Care [facility]) - CM/SW Discharge Note   Patient Details  Name: Ashley [name] MRN: [id number] Date of Birth: [birth year]  Transition of Care [facility]) CM/SW Contact:  Leone Haven, RN Phone Number: 04/04/2022, 2:41 PM   Clinical Narrative:    NCM made referral to Tresa Endo with [facility] for [facility], HHAIDE and Social Worker, she states she can take the referral , will see how they can best help this patient.  Plan will be for dc tomorrow, , NCM has arranged transport by [facility] for 3pm tomorrow.  MD has spoken to son today to inform him of the dc tomorrow and he states son is ok with her being dc tomorrow. NCM also spoke with Iantha Fallen, he states 3 pm would be a good transport time for patient. MD wanted this NCM to talk to patient about having palliative services.  She states she does not want it at this time.   Final next level of care: Home w Home Health Services Barriers to Discharge: Continued Medical Work up   Patient Goals and CMS Choice Patient states their goals for this hospitalization and ongoing recovery are:: return home   Choice offered to / list presented to : Patient  Discharge Placement                       Discharge Plan and Services In-house Referral: Clinical Social Work Discharge Planning Services: CM Consult              DME Agency: NA       HH Arranged: Charity fundraiser, Nurse's Aide, Social Work [employer] Agency: [facility] Date HH Agency Contacted: 04/04/22 Time HH Agency Contacted: 1441 Representative spoke with at [facility] Agency: Tresa Endo  Social Determinants of Health (SDOH) Interventions     Readmission Risk Interventions     No data to display

## 2022-04-04 NOTE — Progress Notes (Signed)
Ashley [name] had several 6 second runs of A-fib w/RVR this am. one run had 7 wide complex beats prior to a run of A-fib, tele called it VT.

## 2022-04-05 ENCOUNTER — Encounter (HOSPITAL_COMMUNITY): Payer: Medicare Other

## 2022-04-05 ENCOUNTER — Other Ambulatory Visit (HOSPITAL_COMMUNITY): Payer: Self-pay

## 2022-04-05 LAB — GLUCOSE, CAPILLARY
Glucose-Capillary: 89 mg/dL (ref 70–99)
Glucose-Capillary: 97 mg/dL (ref 70–99)

## 2022-04-05 MED ORDER — MIDODRINE HCL 5 MG PO TABS
5.0000 mg | ORAL_TABLET | Freq: Three times a day (TID) | ORAL | 0 refills | Status: DC
Start: 2022-04-05 — End: 2022-04-07
  Filled 2022-04-05: qty 90, 30d supply, fill #0

## 2022-04-05 MED ORDER — ENSURE ENLIVE PO LIQD
237.0000 mL | Freq: Two times a day (BID) | ORAL | 0 refills | Status: AC
  Filled 2022-04-05: qty 237, 1d supply, fill #0

## 2022-04-05 MED ORDER — CERTAVITE/ANTIOXIDANTS PO TABS
1.0000 | ORAL_TABLET | Freq: Every day | ORAL | 0 refills | Status: DC
  Filled 2022-04-05: qty 30, 30d supply, fill #0

## 2022-04-05 MED ORDER — METOPROLOL TARTRATE 12.5 MG HALF TABLET
12.5000 mg | ORAL_TABLET | Freq: Two times a day (BID) | ORAL | Status: DC
Start: 2022-04-05 — End: 2022-04-05
  Administered 2022-04-05: 12.5 mg via ORAL
  Filled 2022-04-05: qty 1

## 2022-04-05 MED ORDER — METOPROLOL TARTRATE 25 MG PO TABS
12.5000 mg | ORAL_TABLET | Freq: Two times a day (BID) | ORAL | 0 refills | Status: AC
  Filled 2022-04-05: qty 30, 30d supply, fill #0

## 2022-04-05 MED ORDER — AMIODARONE HCL 200 MG PO TABS
200.0000 mg | ORAL_TABLET | Freq: Every day | ORAL | 0 refills | Status: DC
Start: 2022-04-05 — End: 2022-04-07
  Filled 2022-04-05: qty 30, 30d supply, fill #0

## 2022-04-05 MED ORDER — SILDENAFIL CITRATE 20 MG PO TABS
20.0000 mg | ORAL_TABLET | Freq: Three times a day (TID) | ORAL | 0 refills | Status: DC
Start: 2022-04-05 — End: 2022-04-07
  Filled 2022-04-05: qty 90, 30d supply, fill #0

## 2022-04-05 NOTE — Discharge Summary (Addendum)
Physician Discharge Summary   Patient: Ashley [name] MRN: [id number] DOB: Nov 23, 1945  Admit date:     03/26/2022  Discharge date: 04/05/22  Discharge Physician: Tawni Millers   PCP: Deland Pretty, MD   Recommendations at discharge:    Patient will be discharge home, she will need long term care facility due to her non ambulatory state an deconditioning. At this point she has decided to go home with home health services. She has been deemed competent to make her own medical decisions. She is aware of the risks of going home with poor assistance, including rehospitalization and even death.  Continue heart rate control with amiodarone and metoprolol.  Anticoagulation with apixaban.  Follow up with primary care in 7 to 10 days. Follow up renal function as outpatient Continue diuresis with furosemide.   Discharge Diagnoses: Principal Problem:   Acute on chronic systolic (congestive) heart failure (HCC) Active Problems:   AKI (acute kidney injury) (HCC)   Pleural effusion, right   Paroxysmal atrial fibrillation (HCC)   Pulmonary embolus (HCC)   Controlled diabetes mellitus type II without complication (HCC)   Essential hypertension   History of stroke   Coronary artery disease   Hypothyroidism   Gastroesophageal reflux disease   Pressure ulcer   Atypical atrial flutter ([place])  Resolved Problems:   * No resolved hospital problems. [facility] Course: [name] was admitted to the hospital with the working diagnosis of decompensated heart failure.  Hospitalization complicated with atrial fibrillation, and difficulty in discharge planing.   76 yo female with the past medical history of hypertension, CVA, T2DM, Coronary artery disease, pulmonary embolism, hypothyroid and paroxysmal atrial fibrillation who presented with lower extremity edema and ambulatory dysfunction. She was discharged from [facility] 09/20[facility], requiring assistance to most activities of daily life. On her  initial physical examination her blood pressure was 102/61, HR 52, RR 20 and 02 saturation 96% on room air. Heart with S1 and S2 irregularly irregular, lungs with decreased breath sounds more on right lower lobe, abdomen not distended and 3+ pitting bilateral lower extremity edema.   Na 140, K 2,7 CL 100 bicarbonate 27 glucose 100 bun 10 cr 1,13 BNP 1,904  Wbc 7,3 hgb 12,3 plt 197  [place] SG 1,006, 6-10 wbc   CT head no acute changes.   Chest radiograph right rotation, with cardiomegaly, right loculated pleural effusion.   EKG 110 bpm, normal axis, normal qtc, atrial fibrillation rhythm, with no significant ST segment changes, lead II, III, AvF, V4 to V6 T wave inversion.   Patient was placed on furosemide for diuresis.  Echocardiogram with severe RV dysfunction.   07/13 Patient had direct current cardioversion.  07/14 bradycardia amiodarone and metoprolol were held.   Psychiatry consulted as second opinion for competency. Plan for patient to go home, she continue to decline SNF/ rehab.   07/17 with episodic atrial fibrillation with RVR, resumed amiodarone today. Continue to have episodic atrial flutter with rapid ventricular response. 07/18 resumed metoprolol 12 ,5 mg po bid  Patient with very poor prognosis, very high risk for complications. She insist in going home. Home health had been arranged.   Assessment and Plan: * Acute on chronic systolic (congestive) heart failure (HCC) Echocardiogram with LV systolic function reduced to 40 to 45%, septal hypokinesis. Moderate LVH, interventricular  Septum is flattened in systolic and diastole. Severe reduction in RV systolic function. RVSP 45.5 mmHg. LA severe dilatation. Small pericardial effusion. Moderate TR.   Acute on chronic core  pulmonale. Pulmonary hypertension.   Patient had diuresis with furosemide, negative fluid balance was achieved with improvement in her symptoms. At the time of her discharge with no dyspnea and improved lower  extremity edema.   Continue midodrine for blood pressure support.  Continue diuresis with furosemide.   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  Continue with sildenafil.  Bilateral unna boots with improvement of peripheral edema.  Anticoagulation with apixaban.  Patient with very poor mobility.    AKI (acute kidney injury) ([place]) Hypokalemia. Hypomagnesemia   Patient tolerated diuresis well, at the time of her discharge her renal function had a serum cr at 1,0 with K at 3,9 and serum bicarbonate at 26. Plan to continue diuresis and Kcl supplementation.  Follow up renal function as outpatient.   Pleural effusion, right Loculated chronic right pleural effusion.  Continue diuresis as tolerated.   Pulmonary embolus (HCC) Hx of submassive PE and chronic DVT in 06/2021 Continue eliquis   Paroxysmal atrial fibrillation (HCC) Atypical atrial flutter.  07/13 Sp direct current cardioversion   Post cardioversion she developed bradycardia and amiodarone and metoprolol were held.  She had recurrent tachycardia episodic and self resolved, atrial fibrillation and atrial flutter.   Resumed amiodarone 200 mg daily and low dose metoprolol.  Continue anticoagulation with apixaban. Plan to follow up as outpatient.   Controlled diabetes mellitus type II without complication (HCC) 123XX123 in 2022 was 6.2,  Patient was placed on insulin sliding scale for glucose cover and monitoring with insulin sliding scale.   Essential hypertension Improved blood pressure with midodrine. Resumed metoprolol for heart rate control, low dose.   History of stroke Continue apixaban and statin therapy.   Coronary artery disease Recent LHC with no obstructive disease Continue statin/eliquis  Hypothyroidism Relatively new diagnosis Continue home synthroid   Gastroesophageal reflux disease Continue pepcid PRN, no longer on chronic PPI therapy    Pressure ulcer Secondary to being bed bound Turn patient Wound care  Stage 2 coccygeal (present on admission).             Consultants: cardiology  Procedures performed: direct current cardioversion   Disposition: Home Diet recommendation:  Cardiac diet DISCHARGE MEDICATION: Allergies as of 04/05/2022       Reactions   Bystolic [nebivolol Hcl] Other (See Comments)   Bradycardia   Coreg [carvedilol] Other (See Comments)   bradycardia   Minoxidil Other (See Comments)   Abdominal pain        Medication List     STOP taking these medications    furosemide 20 MG tablet Commonly known as: LASIX   losartan 25 MG tablet Commonly known as: COZAAR   metoprolol succinate 25 MG 24 hr tablet Commonly known as: TOPROL-XL   potassium chloride SA 20 MEQ tablet Commonly known as: KLOR-CON M   spironolactone 25 MG tablet Commonly known as: ALDACTONE       TAKE these medications    acetaminophen 500 MG tablet Commonly known as: TYLENOL Take 500 mg by mouth daily as needed for mild pain or headache.   amiodarone 200 MG tablet Commonly known as: PACERONE Take 1 tablet (200 mg total) by mouth daily. What changed: how much to take   Eliquis 5 MG Tabs tablet Generic drug: apixaban Take 1 tablet (5 mg total) by mouth 2 (two) times daily.   famotidine 20 MG tablet Commonly known as: PEPCID Take 1 tablet (20 mg total) by mouth at bedtime  as needed. What changed: reasons to take this   feeding supplement Liqd Take 237 mLs by mouth 2 (two) times daily between meals.   levothyroxine 25 MCG tablet Commonly known as: SYNTHROID Take 1 tablet (25 mcg total) by mouth in the morning on an empty stomach   metoprolol tartrate 25 MG tablet Commonly known as: LOPRESSOR Take 0.5 tablets (12.5 mg total) by mouth 2 (two) times daily.   midodrine 5 MG tablet Commonly known as: PROAMATINE Take 1 tablet (5 mg total) by mouth 3 (three) times daily with meals.    mirtazapine 15 MG tablet Commonly known as: REMERON Take 1 tablet (15 mg total) by mouth at bedtime.   multivitamin with minerals Tabs tablet Take 1 tablet by mouth daily.   potassium chloride 10 MEQ tablet Commonly known as: KLOR-CON Take 1 tablet (10 mEq total) by mouth daily with food   rosuvastatin 5 MG tablet Commonly known as: Crestor Take 1 tablet (5 mg total) by mouth daily.   sildenafil 20 MG tablet Commonly known as: REVATIO Take 1 tablet (20 mg total) by mouth 3 (three) times daily.               Discharge Care Instructions  (From admission, onward)           Start     Ordered   04/05/22 0000  Discharge wound care:       Comments: Wound care to sacral pressure injury (Stage 2, POA): Cleanse with soap and water, rinse and pat dry. Cover with size appropriate piece of xeroform gauze Kellie Simmering # 294), top with dry gauze and cover with silicone foam placed with "tip" oriented away from anus. Change xeroform daily, may reuse silicone foam for up to 3 days. Change PRN soiling.   04/05/22 0943            Follow-up Information     Deland Pretty, MD Follow up.   Specialty: Internal Medicine Contact information: [address] [place] [address] [phone]         Glory Buff Follow up on 04/08/2022.   Why: 9:45 for follow up visit by your home. Contact information: [address] [place] [address] [phone]        [facility].   Specialty: [place] Why: HHRN, HHA[facility], [facility] will contact you to set up apt times. Contact information: [address] [place] [address] [phone]                Discharge Exam: Filed Weights   04/03/22 0400 04/04/22 0418 04/05/22 0352  Weight: 88.1 kg 90 kg 90 kg   BP (!) 99/58 (BP Location: Left Arm)   Pulse 61   Temp 98.3 F (36.8 C) (Oral)   Resp 20   Ht 5\' 8"  (1.727 m)   Wt 90 kg   SpO2 98%   BMI 30.17 kg/m    Patient with no chest pain or dyspnea.   Neurology awake and alert ENT with no pallor Cardiovascular with S1 and S2 present, irregular, no gallops, positive systolic murmur at the right sternal border Respiratory with no rales or rhonchi Abdomen not distended Trace lower extremity edema, unna boots in place.   Condition at discharge: stable  The results of significant diagnostics from this hospitalization (including imaging, microbiology, ancillary and laboratory) are listed below for reference.   Imaging Studies: VAS [place] ABI WITH/WO TBI  Result Date:  03/30/2022  LOWER EXTREMITY DOPPLER STUDY Patient Name:  MIO PLACIDE [facility]  Date of Exam:   03/29/2022 Medical Rec #: [id number]             Accession #:    [id number] Date of Birth: 1946-01-16             Patient Gender: F Patient Age:   76 years Exam Location:  [facility] Procedure:      VAS [place] ABI WITH/WO TBI Referring Phys: Domenic Polite --------------------------------------------------------------------------------  Indications: Pain. High Risk Factors: Hypertension, Diabetes, no history of smoking, prior MI,                    coronary artery disease, prior CVA. Other Factors: Afib, CHF.  Comparison Study: No previous exams Performing Technologist: Hill, Jody RVT, RDMS  Examination Guidelines: A complete evaluation includes at minimum, Doppler waveform signals and systolic blood pressure reading at the level of bilateral brachial, anterior tibial, and posterior tibial arteries, when vessel segments are accessible. Bilateral testing is considered an integral part of a complete examination. Photoelectric Plethysmograph (PPG) waveforms and toe systolic pressure readings are included as required and additional duplex testing as needed. Limited examinations for reoccurring indications may be performed as noted.  ABI Findings: +---------+------------------+-----+---------+--------+ Right    Rt Pressure (mmHg)IndexWaveform Comment   +---------+------------------+-----+---------+--------+ Brachial 100                    triphasic         +---------+------------------+-----+---------+--------+ PTA      105               1.05 triphasic         +---------+------------------+-----+---------+--------+ DP       115               1.15 triphasic         +---------+------------------+-----+---------+--------+ Great Toe59                0.59 Abnormal          +---------+------------------+-----+---------+--------+ +---------+------------------+-----+---------+-------+ Left     Lt Pressure (mmHg)IndexWaveform Comment +---------+------------------+-----+---------+-------+ Brachial 98                     triphasic        +---------+------------------+-----+---------+-------+ PTA      100               1.00 triphasic        +---------+------------------+-----+---------+-------+ DP       109               1.09 triphasic        +---------+------------------+-----+---------+-------+ Great Toe55                0.55 Abnormal         +---------+------------------+-----+---------+-------+  Summary: Right: Resting right ankle-brachial index is within normal range. No evidence of significant right lower extremity arterial disease. Left: Resting left ankle-brachial index is within normal range. No evidence of significant left lower extremity arterial disease. *See table(s) above for measurements and observations.  Electronically signed by Harold Barban MD on 03/30/2022 at 9:54:04 PM.    Final    ECHOCARDIOGRAM COMPLETE  Result Date: 03/28/2022    ECHOCARDIOGRAM REPORT   Patient Name:   UARDA COBOS [facility] Date of Exam: 03/28/2022 Medical Rec #:  [id number]            Height:  68.0 in Accession #:    [id number]           Weight:       205.0 lb Date of Birth:  01/09/1946            BSA:          2.065 m Patient Age:    89 years             BP:           99/81 mmHg Patient Gender: F                    HR:            90 bpm. Exam Location:  Inpatient Procedure: 2D Echo, Cardiac Doppler and Color Doppler Indications:     CHF-Acute Systolic [id number]  History:         Patient has prior history of Echocardiogram examinations, most                  recent 06/22/2021. Stroke; Risk Factors:Hypertension.  Sonographer:     Bernadene Person RDCS Referring Phys:  [id number] Orma Flaming Diagnosing Phys: Oswaldo Milian MD IMPRESSIONS  1. Left ventricular ejection fraction, by estimation, is 40 to 45%. The left ventricle has mildly decreased function. The left ventricle demonstrates regional wall motion abnormalities. Septal hypokinesis. There is moderate left ventricular hypertrophy.  Left ventricular diastolic parameters are indeterminate. There is the interventricular septum is flattened in systole and diastole, consistent with right ventricular pressure and volume overload.  2. Right ventricular systolic function is severely reduced. The right ventricular size is moderately enlarged. There is moderately elevated pulmonary artery systolic pressure. The estimated right ventricular systolic pressure is [id number] mmHg.  3. Left atrial size was severely dilated.  4. Right atrial size was mildly dilated.  5. A small pericardial effusion is present.  6. The mitral valve is normal in structure. Mild mitral valve regurgitation. No evidence of mitral stenosis.  7. Tricuspid valve regurgitation is moderate.  8. The aortic valve was not well visualized. Aortic valve regurgitation is trivial. No aortic stenosis is present.  9. Aortic dilatation noted. There is mild dilatation of the ascending aorta, measuring 38 mm. 10. The inferior vena cava is normal in size with greater than 50% respiratory variability, suggesting right atrial pressure of 3 mmHg. FINDINGS  Left Ventricle: Left ventricular ejection fraction, by estimation, is 40 to 45%. The left ventricle has mildly decreased function. The left ventricle demonstrates regional wall motion  abnormalities. The left ventricular internal cavity size was normal in size. There is moderate left ventricular hypertrophy. The interventricular septum is flattened in systole and diastole, consistent with right ventricular pressure and volume overload. Left ventricular diastolic parameters are indeterminate. Right Ventricle: The right ventricular size is moderately enlarged. Right vetricular wall thickness was not well visualized. Right ventricular systolic function is severely reduced. There is moderately elevated pulmonary artery systolic pressure. The tricuspid regurgitant velocity is 3.26 m/s, and with an assumed right atrial pressure of 3 mmHg, the estimated right ventricular systolic pressure is [id number] mmHg. Left Atrium: Left atrial size was severely dilated. Right Atrium: Right atrial size was mildly dilated. Pericardium: A small pericardial effusion is present. Mitral Valve: The mitral valve is normal in structure. Mild mitral valve regurgitation. No evidence of mitral valve stenosis. Tricuspid Valve: The tricuspid valve is normal in structure. Tricuspid valve regurgitation is moderate. Aortic Valve: The aortic valve was not well visualized. Aortic valve regurgitation is  trivial. No aortic stenosis is present. Pulmonic Valve: The pulmonic valve was not well visualized. Pulmonic valve regurgitation is not visualized. Aorta: The aortic root is normal in size and structure and aortic dilatation noted. There is mild dilatation of the ascending aorta, measuring 38 mm. Venous: The inferior vena cava is normal in size with greater than 50% respiratory variability, suggesting right atrial pressure of 3 mmHg. IAS/Shunts: The interatrial septum was not well visualized.  LEFT VENTRICLE PLAX 2D LVIDd:         4.20 cm LVIDs:         3.70 cm LV PW:         1.40 cm LV IVS:        1.20 cm LVOT diam:     2.00 cm LV SV:         26 LV SV Index:   13 LVOT Area:     3.14 cm  LV Volumes (MOD) LV vol d, MOD A2C: 59.3 ml LV vol d,  MOD A4C: 63.4 ml LV vol s, MOD A2C: 37.6 ml LV vol s, MOD A4C: 38.6 ml LV SV MOD A2C:     21.7 ml LV SV MOD A4C:     63.4 ml LV SV MOD BP:      26.3 ml RIGHT VENTRICLE RV S prime:     7.95 cm/s TAPSE (M-mode): 1.5 cm LEFT ATRIUM              Index        RIGHT ATRIUM           Index LA diam:        3.30 cm  1.60 cm/m   RA Area:     25.20 cm LA Vol (A2C):   132.0 ml 63.91 ml/m  RA Volume:   74.90 ml  36.26 ml/m LA Vol (A4C):   176.0 ml 85.21 ml/m LA Biplane Vol: 155.0 ml 75.05 ml/m  AORTIC VALVE LVOT Vmax:   62.17 cm/s LVOT Vmean:  39.000 cm/s LVOT VTI:    0.082 m  AORTA Ao Root diam: 3.90 cm Ao Asc diam:  3.80 cm MR Peak grad: 68.6 mmHg   TRICUSPID VALVE MR Mean grad: 40.0 mmHg   TR Peak grad:   42.5 mmHg MR Vmax:      414.00 cm/s TR Vmax:        326.00 cm/s MR Vmean:     302.0 cm/s                           SHUNTS                           Systemic VTI:  0.08 m                           Systemic Diam: 2.00 cm Epifanio Lesches MD Electronically signed by Epifanio Lesches MD Signature Date/Time: 03/28/2022/3:19:31 PM    Final (Updated)    DG Chest 2 View  Result Date: 03/28/2022 CLINICAL DATA:  Pleural effusion EXAM: CHEST - 2 VIEW COMPARISON:  Chest x-ray dated March 26, 2022 FINDINGS: Unchanged enlarged cardiac and mediastinal contours. Stable moderate loculated right pleural effusion. No evidence of left pleural effusion. No evidence of pneumothorax. IMPRESSION: 1. Stable moderate loculated right pleural effusion. 2. Cardiomegaly. Electronically Signed   By: Allegra Lai M.D.   On: 03/28/2022 15:03  DG Foot Complete Left  Result Date: 03/26/2022 CLINICAL DATA:  Swelling. EXAM: LEFT FOOT - COMPLETE 3+ VIEW COMPARISON:  None Available. FINDINGS: There is no evidence of fracture or dislocation. No radiographic evidence of osteomyelitis. Marked soft tissue swelling about the left foot. IMPRESSION: 1. No radiographic evidence of osteomyelitis. 2. Marked soft tissue swelling about the left foot.  Electronically Signed   By: Fidela Salisbury M.D.   On: 03/26/2022 15:41   DG Foot Complete Right  Result Date: 03/26/2022 CLINICAL DATA:  Foot ulceration EXAM: RIGHT FOOT COMPLETE - 3+ VIEW COMPARISON:  None Available. FINDINGS: There is no evidence of fracture or dislocation. There is no evidence of arthropathy or other focal bone abnormality. Diffuse soft tissue edema about the foot and ankle. IMPRESSION: No fracture or dislocation of the right foot. Diffuse soft tissue edema about the foot and ankle. Electronically Signed   By: Delanna Ahmadi M.D.   On: 03/26/2022 15:40   DG Chest Portable 1 View  Result Date: 03/26/2022 CLINICAL DATA:  Leg swelling. EXAM: PORTABLE CHEST 1 VIEW COMPARISON:  07/28/21. FINDINGS: Cardiac enlargement, unchanged. There is a moderate right pleural effusion with decreased aeration to the right midlung and right base. Left lung appears clear. No definite left pleural effusion. Visualized osseous structures are unremarkable. IMPRESSION: 1. Cardiac enlargement. 2. Moderate right pleural effusion with decreased aeration to the right midlung and right base. Electronically Signed   By: Kerby Moors M.D.   On: 03/26/2022 14:13   CT Head Wo Contrast  Result Date: 03/26/2022 CLINICAL DATA:  Altered mental status EXAM: CT HEAD WITHOUT CONTRAST TECHNIQUE: Contiguous axial images were obtained from the base of the skull through the vertex without intravenous contrast. RADIATION DOSE REDUCTION: This exam was performed according to the departmental dose-optimization program which includes automated exposure control, adjustment of the mA and/or kV according to patient size and/or use of iterative reconstruction technique. COMPARISON:  CT head 04/04/2020 FINDINGS: Brain: No acute intracranial hemorrhage, mass effect, or herniation. No extra-axial fluid collections. No evidence of acute territorial infarct. No hydrocephalus. Mild cortical volume loss. Patchy hypodensities throughout the  periventricular and subcortical white matter, likely secondary to chronic microvascular ischemic changes. Patchy areas of encephalomalacia in the right temporal and parietal lobe secondary to old infarct. Vascular: No hyperdense vessel or unexpected calcification. Skull: Normal. Negative for fracture or focal lesion. Sinuses/Orbits: Small fluid levels in the right sphenoid sinus and left maxillary sinus. Other: Near-complete opacification of the right mastoid air cells and mild opacification of the left mastoid air cells, new since previous study. IMPRESSION: 1. No acute intracranial process identified. Chronic changes as described. 2. Correlate for possible acute left maxillary and right sphenoid sinusitis. 3. Bilateral mastoid air cell opacification right-greater-than-left, new since previous study. Correlate clinically. Electronically Signed   By: Ofilia Neas M.D.   On: 03/26/2022 13:43    Microbiology: Results for orders placed or performed during the hospital encounter of 07/28/21  Resp Panel by RT-PCR (Flu A&B, Covid) Nasopharyngeal Swab     Status: None   Collection Time: 07/28/21  8:19 PM   Specimen: Nasopharyngeal Swab; Nasopharyngeal(NP) swabs in vial transport medium  Result Value Ref Range Status   SARS Coronavirus 2 by RT PCR NEGATIVE NEGATIVE Final    Comment: (NOTE) SARS-CoV-2 target nucleic acids are NOT DETECTED.  The SARS-CoV-2 RNA is generally detectable in upper respiratory specimens during the acute phase of infection. The lowest concentration of SARS-CoV-2 viral copies this assay can detect is 138  copies/mL. A negative result does not preclude SARS-Cov-2 infection and should not be used as the sole basis for treatment or other patient management decisions. A negative result may occur with  improper specimen collection/handling, submission of specimen other than nasopharyngeal swab, presence of viral mutation(s) within the areas targeted by this assay, and inadequate  number of viral copies(<138 copies/mL). A negative result must be combined with clinical observations, patient history, and epidemiological information. The expected result is Negative.  Fact Sheet for Patients:  BloggerCourse.com  Fact Sheet for Healthcare Providers:  SeriousBroker.it  This test is no t yet approved or cleared by the [place] FDA and  has been authorized for detection and/or diagnosis of SARS-CoV-2 by FDA under an Emergency Use Authorization (EUA). This EUA will remain  in effect (meaning this test can be used) for the duration of the COVID-19 declaration under Section 564(b)(1) of the Act, 21 U.S.C.section 360bbb-3(b)(1), unless the authorization is terminated  or revoked sooner.       Influenza A by PCR NEGATIVE NEGATIVE Final   Influenza B by PCR NEGATIVE NEGATIVE Final    Comment: (NOTE) The Xpert Xpress SARS-CoV-2/FLU/RSV plus assay is intended as an aid in the diagnosis of influenza from Nasopharyngeal swab specimens and should not be used as a sole basis for treatment. Nasal washings and aspirates are unacceptable for Xpert Xpress SARS-CoV-2/FLU/RSV testing.  Fact Sheet for Patients: BloggerCourse.com  Fact Sheet for Healthcare Providers: SeriousBroker.it  This test is not yet approved or cleared by the [place] FDA and has been authorized for detection and/or diagnosis of SARS-CoV-2 by FDA under an Emergency Use Authorization (EUA). This EUA will remain in effect (meaning this test can be used) for the duration of the COVID-19 declaration under Section 564(b)(1) of the Act, 21 U.S.C. section 360bbb-3(b)(1), unless the authorization is terminated or revoked.  Performed at [facility], [address], [place], [place] [address]   Resp Panel by RT-PCR (Flu A&B, Covid) Nasopharyngeal Swab     Status: None   Collection Time: 08/02/21  11:50 AM   Specimen: Nasopharyngeal Swab; Nasopharyngeal(NP) swabs in vial transport medium  Result Value Ref Range Status   SARS Coronavirus 2 by RT PCR NEGATIVE NEGATIVE Final    Comment: (NOTE) SARS-CoV-2 target nucleic acids are NOT DETECTED.  The SARS-CoV-2 RNA is generally detectable in upper respiratory specimens during the acute phase of infection. The lowest concentration of SARS-CoV-2 viral copies this assay can detect is 138 copies/mL. A negative result does not preclude SARS-Cov-2 infection and should not be used as the sole basis for treatment or other patient management decisions. A negative result may occur with  improper specimen collection/handling, submission of specimen other than nasopharyngeal swab, presence of viral mutation(s) within the areas targeted by this assay, and inadequate number of viral copies(<138 copies/mL). A negative result must be combined with clinical observations, patient history, and epidemiological information. The expected result is Negative.  Fact Sheet for Patients:  BloggerCourse.com  Fact Sheet for Healthcare Providers:  SeriousBroker.it  This test is no t yet approved or cleared by the [place] FDA and  has been authorized for detection and/or diagnosis of SARS-CoV-2 by FDA under an Emergency Use Authorization (EUA). This EUA will remain  in effect (meaning this test can be used) for the duration of the COVID-19 declaration under Section 564(b)(1) of the Act, 21 U.S.C.section 360bbb-3(b)(1), unless the authorization is terminated  or revoked sooner.       Influenza A  by PCR NEGATIVE NEGATIVE Final   Influenza B by PCR NEGATIVE NEGATIVE Final    Comment: (NOTE) The Xpert Xpress SARS-CoV-2/FLU/RSV plus assay is intended as an aid in the diagnosis of influenza from Nasopharyngeal swab specimens and should not be used as a sole basis for treatment. Nasal washings and aspirates  are unacceptable for Xpert Xpress SARS-CoV-2/FLU/RSV testing.  Fact Sheet for Patients: EntrepreneurPulse.com.au  Fact Sheet for Healthcare Providers: IncredibleEmployment.be  This test is not yet approved or cleared by the [place] FDA and has been authorized for detection and/or diagnosis of SARS-CoV-2 by FDA under an Emergency Use Authorization (EUA). This EUA will remain in effect (meaning this test can be used) for the duration of the COVID-19 declaration under Section 564(b)(1) of the Act, 21 U.S.C. section 360bbb-3(b)(1), unless the authorization is terminated or revoked.  Performed at [facility], [place] [address], [place], [place] [address]     Labs: CBC: Recent Labs  Lab 04/04/22 0438  WBC 5.4  HGB 10.0*  HCT 29.1*  MCV 72.9*  PLT 0000000   Basic Metabolic Panel: Recent Labs  Lab 03/29/22 1226 03/30/22 0118 03/31/22 0234 04/01/22 0438 04/03/22 0415 04/04/22 0438  NA 139 138 138 137 136 139  K 3.5 3.5 4.2 4.1 4.0 3.9  CL 98 100 103 104 104 106  CO2 32 30 28 28 25 26   GLUCOSE 112* 86 83 89 84 76  BUN 11 13 18 21 19 15   CREATININE 1.12* 1.01* 1.11* 1.03* 1.04* 1.02*  CALCIUM 8.8* 8.5* 8.8* 8.7* 8.6* 8.6*  MG 1.6* 2.1  --  2.0  --  1.8   Liver Function Tests: No results for input(s): "AST", "ALT", "ALKPHOS", "BILITOT", "PROT", "ALBUMIN" in the last 168 hours. CBG: Recent Labs  Lab 04/04/22 0622 04/04/22 1048 04/04/22 1604 04/04/22 2114 04/05/22 0555  GLUCAP 77 121* 94 138* 89    Discharge time spent: greater than 30 minutes.  Signed: Tawni Millers, MD [facility] 04/05/2022

## 2022-04-06 DIAGNOSIS — I482 Chronic atrial fibrillation, unspecified: Secondary | ICD-10-CM | POA: Diagnosis not present

## 2022-04-06 DIAGNOSIS — Z8673 Personal history of transient ischemic attack (TIA), and cerebral infarction without residual deficits: Secondary | ICD-10-CM | POA: Diagnosis not present

## 2022-04-07 ENCOUNTER — Other Ambulatory Visit (HOSPITAL_COMMUNITY): Payer: Self-pay

## 2022-04-07 DIAGNOSIS — L22 Diaper dermatitis: Secondary | ICD-10-CM | POA: Diagnosis not present

## 2022-04-07 DIAGNOSIS — K219 Gastro-esophageal reflux disease without esophagitis: Secondary | ICD-10-CM | POA: Diagnosis not present

## 2022-04-07 DIAGNOSIS — E119 Type 2 diabetes mellitus without complications: Secondary | ICD-10-CM | POA: Diagnosis not present

## 2022-04-07 DIAGNOSIS — N179 Acute kidney failure, unspecified: Secondary | ICD-10-CM | POA: Diagnosis not present

## 2022-04-07 DIAGNOSIS — I2609 Other pulmonary embolism with acute cor pulmonale: Secondary | ICD-10-CM | POA: Diagnosis not present

## 2022-04-07 DIAGNOSIS — L89152 Pressure ulcer of sacral region, stage 2: Secondary | ICD-10-CM | POA: Diagnosis not present

## 2022-04-07 DIAGNOSIS — I4892 Unspecified atrial flutter: Secondary | ICD-10-CM | POA: Diagnosis not present

## 2022-04-07 DIAGNOSIS — I480 Paroxysmal atrial fibrillation: Secondary | ICD-10-CM | POA: Diagnosis not present

## 2022-04-07 DIAGNOSIS — E039 Hypothyroidism, unspecified: Secondary | ICD-10-CM | POA: Diagnosis not present

## 2022-04-07 DIAGNOSIS — I2510 Atherosclerotic heart disease of native coronary artery without angina pectoris: Secondary | ICD-10-CM | POA: Diagnosis not present

## 2022-04-07 DIAGNOSIS — Z8673 Personal history of transient ischemic attack (TIA), and cerebral infarction without residual deficits: Secondary | ICD-10-CM | POA: Diagnosis not present

## 2022-04-07 DIAGNOSIS — I5023 Acute on chronic systolic (congestive) heart failure: Secondary | ICD-10-CM | POA: Diagnosis not present

## 2022-04-07 DIAGNOSIS — Z7401 Bed confinement status: Secondary | ICD-10-CM | POA: Diagnosis not present

## 2022-04-07 DIAGNOSIS — E876 Hypokalemia: Secondary | ICD-10-CM | POA: Diagnosis not present

## 2022-04-07 DIAGNOSIS — Z7901 Long term (current) use of anticoagulants: Secondary | ICD-10-CM | POA: Diagnosis not present

## 2022-04-07 DIAGNOSIS — I110 Hypertensive heart disease with heart failure: Secondary | ICD-10-CM | POA: Diagnosis not present

## 2022-04-07 MED ORDER — ELIQUIS 5 MG PO TABS
5.0000 mg | ORAL_TABLET | Freq: Two times a day (BID) | ORAL | 0 refills | Status: AC
  Filled 2022-04-07: qty 60, 30d supply, fill #0

## 2022-04-07 MED ORDER — FUROSEMIDE 20 MG PO TABS
20.0000 mg | ORAL_TABLET | Freq: Every day | ORAL | 0 refills | Status: DC | PRN
Start: 2022-04-07 — End: 2022-04-07
  Filled 2022-04-07: qty 1, 1d supply, fill #0

## 2022-04-07 MED ORDER — CERTAVITE/ANTIOXIDANTS PO TABS
1.0000 | ORAL_TABLET | Freq: Every day | ORAL | 0 refills | Status: AC
  Filled 2022-05-02: qty 30, 30d supply, fill #0

## 2022-04-07 MED ORDER — SPIRONOLACTONE 25 MG PO TABS
25.0000 mg | ORAL_TABLET | Freq: Every day | ORAL | 0 refills | Status: DC
  Filled 2022-04-07: qty 1, 1d supply, fill #0

## 2022-04-07 MED ORDER — AMIODARONE HCL 200 MG PO TABS
200.0000 mg | ORAL_TABLET | Freq: Every day | ORAL | 0 refills | Status: AC
  Filled 2022-04-07 – 2022-05-02 (×2): qty 30, 30d supply, fill #0

## 2022-04-07 MED ORDER — ROSUVASTATIN CALCIUM 5 MG PO TABS
5.0000 mg | ORAL_TABLET | Freq: Every day | ORAL | 0 refills | Status: AC
  Filled 2022-04-07: qty 30, 30d supply, fill #0

## 2022-04-07 MED ORDER — POLYETHYLENE GLYCOL 3350 17 GM/SCOOP PO POWD
ORAL | 0 refills | Status: AC
  Filled 2022-04-07: qty 238, 30d supply, fill #0

## 2022-04-07 MED ORDER — LEVOTHYROXINE SODIUM 25 MCG PO TABS
25.0000 ug | ORAL_TABLET | Freq: Every morning | ORAL | 0 refills | Status: AC
  Filled 2022-04-07: qty 30, 30d supply, fill #0

## 2022-04-07 MED ORDER — FAMOTIDINE 20 MG PO TABS
20.0000 mg | ORAL_TABLET | Freq: Every evening | ORAL | 0 refills | Status: DC | PRN
Start: 2022-04-07 — End: 2022-05-09
  Filled 2022-04-07: qty 30, 30d supply, fill #0

## 2022-04-07 MED ORDER — MIRTAZAPINE 15 MG PO TABS
15.0000 mg | ORAL_TABLET | Freq: Every evening | ORAL | 0 refills | Status: DC
Start: 2022-04-07 — End: 2022-05-09
  Filled 2022-04-07: qty 30, 30d supply, fill #0

## 2022-04-07 MED ORDER — LOSARTAN POTASSIUM 25 MG PO TABS
12.5000 mg | ORAL_TABLET | Freq: Every day | ORAL | 0 refills | Status: DC
  Filled 2022-04-07: qty 1, 2d supply, fill #0

## 2022-04-07 MED ORDER — MIDODRINE HCL 5 MG PO TABS
5.0000 mg | ORAL_TABLET | Freq: Three times a day (TID) | ORAL | 0 refills | Status: AC
  Filled 2022-04-07 – 2022-05-02 (×2): qty 90, 30d supply, fill #0

## 2022-04-07 MED ORDER — SILDENAFIL CITRATE 20 MG PO TABS
20.0000 mg | ORAL_TABLET | Freq: Three times a day (TID) | ORAL | 0 refills | Status: AC
  Filled 2022-04-07 – 2022-05-02 (×2): qty 90, 30d supply, fill #0

## 2022-04-07 MED ORDER — METOPROLOL SUCCINATE ER 25 MG PO TB24
12.5000 mg | ORAL_TABLET | Freq: Two times a day (BID) | ORAL | 0 refills | Status: AC
  Filled 2022-04-07 (×2): qty 60, 60d supply, fill #0

## 2022-04-07 MED ORDER — POTASSIUM CHLORIDE ER 10 MEQ PO TBCR
10.0000 meq | EXTENDED_RELEASE_TABLET | Freq: Every day | ORAL | 0 refills | Status: DC
  Filled 2022-04-07: qty 30, 30d supply, fill #0

## 2022-04-08 ENCOUNTER — Other Ambulatory Visit (HOSPITAL_COMMUNITY): Payer: Self-pay

## 2022-04-08 DIAGNOSIS — R54 Age-related physical debility: Secondary | ICD-10-CM | POA: Diagnosis not present

## 2022-04-08 DIAGNOSIS — I5033 Acute on chronic diastolic (congestive) heart failure: Secondary | ICD-10-CM | POA: Diagnosis not present

## 2022-04-11 ENCOUNTER — Other Ambulatory Visit: Payer: Self-pay

## 2022-04-11 DIAGNOSIS — I480 Paroxysmal atrial fibrillation: Secondary | ICD-10-CM

## 2022-04-11 DIAGNOSIS — R627 Adult failure to thrive: Secondary | ICD-10-CM

## 2022-04-11 DIAGNOSIS — I5023 Acute on chronic systolic (congestive) heart failure: Secondary | ICD-10-CM

## 2022-04-12 ENCOUNTER — Telehealth: Payer: Self-pay | Admitting: *Deleted

## 2022-04-12 ENCOUNTER — Encounter: Payer: Medicare Other | Admitting: *Deleted

## 2022-04-12 DIAGNOSIS — L89152 Pressure ulcer of sacral region, stage 2: Secondary | ICD-10-CM | POA: Diagnosis not present

## 2022-04-12 DIAGNOSIS — N179 Acute kidney failure, unspecified: Secondary | ICD-10-CM | POA: Diagnosis not present

## 2022-04-12 DIAGNOSIS — Z8673 Personal history of transient ischemic attack (TIA), and cerebral infarction without residual deficits: Secondary | ICD-10-CM | POA: Diagnosis not present

## 2022-04-12 DIAGNOSIS — L22 Diaper dermatitis: Secondary | ICD-10-CM | POA: Diagnosis not present

## 2022-04-12 DIAGNOSIS — E876 Hypokalemia: Secondary | ICD-10-CM | POA: Diagnosis not present

## 2022-04-12 DIAGNOSIS — I480 Paroxysmal atrial fibrillation: Secondary | ICD-10-CM | POA: Diagnosis not present

## 2022-04-12 DIAGNOSIS — E119 Type 2 diabetes mellitus without complications: Secondary | ICD-10-CM | POA: Diagnosis not present

## 2022-04-12 DIAGNOSIS — I2609 Other pulmonary embolism with acute cor pulmonale: Secondary | ICD-10-CM | POA: Diagnosis not present

## 2022-04-12 DIAGNOSIS — E039 Hypothyroidism, unspecified: Secondary | ICD-10-CM | POA: Diagnosis not present

## 2022-04-12 DIAGNOSIS — I4892 Unspecified atrial flutter: Secondary | ICD-10-CM | POA: Diagnosis not present

## 2022-04-12 DIAGNOSIS — I110 Hypertensive heart disease with heart failure: Secondary | ICD-10-CM | POA: Diagnosis not present

## 2022-04-12 DIAGNOSIS — I2510 Atherosclerotic heart disease of native coronary artery without angina pectoris: Secondary | ICD-10-CM | POA: Diagnosis not present

## 2022-04-12 DIAGNOSIS — Z7401 Bed confinement status: Secondary | ICD-10-CM | POA: Diagnosis not present

## 2022-04-12 DIAGNOSIS — K219 Gastro-esophageal reflux disease without esophagitis: Secondary | ICD-10-CM | POA: Diagnosis not present

## 2022-04-12 DIAGNOSIS — Z7901 Long term (current) use of anticoagulants: Secondary | ICD-10-CM | POA: Diagnosis not present

## 2022-04-12 DIAGNOSIS — I5023 Acute on chronic systolic (congestive) heart failure: Secondary | ICD-10-CM | POA: Diagnosis not present

## 2022-04-12 NOTE — Chronic Care Management (AMB) (Signed)
  Care Coordination  Note  04/12/2022 Name: Ashley [name] MRN: [id number] DOB: Jan 13, 1946  Ashley [name] is a 77 y.o. year old female who is a primary care patient of Merri Brunette, MD. I reached out to [employer] by phone today to offer care coordination services.      [name] was given information about Care Coordination services today including:  The Care Coordination services include support from the care team which includes your Nurse Coordinator, Clinical Social Worker, or Pharmacist.  The Care Coordination team is here to help remove barriers to the health concerns and goals most important to you. Care Coordination services are voluntary and the patient may decline or stop services at any time by request to their care team member.   Patient agreed to services and verbal consent obtained.   Follow up plan: Telephone appointment with care coordination team member scheduled for:04/13/22  [facility] Coordination Care Guide  Direct Dial: [phone]

## 2022-04-13 ENCOUNTER — Telehealth: Payer: Self-pay | Admitting: *Deleted

## 2022-04-13 ENCOUNTER — Encounter: Payer: Self-pay | Admitting: *Deleted

## 2022-04-13 NOTE — Patient Outreach (Signed)
[facility]) Care Management  04/13/2022  [name] 1946-08-08 [id number]   Care Alignment Note  Care Management   Follow Up Note   04/13/2022 Name: Ashley [name] MRN: [id number] DOB: 01-May-1946   Referred by: Merri Brunette, MD Reason for referral : Care Coordination (HTN, DM2)   An unsuccessful telephone outreach was attempted today. The patient was referred to the case management team for assistance with care management and care coordination.   Follow Up Plan: Telephone follow up appointment with care management team member scheduled for: upon care guide rescheduling.  Irving Shows [facility], BSN RN Case Manager [phone]

## 2022-04-14 DIAGNOSIS — I5022 Chronic systolic (congestive) heart failure: Secondary | ICD-10-CM | POA: Diagnosis not present

## 2022-04-14 DIAGNOSIS — M6281 Muscle weakness (generalized): Secondary | ICD-10-CM | POA: Diagnosis not present

## 2022-04-15 DIAGNOSIS — Z7401 Bed confinement status: Secondary | ICD-10-CM | POA: Diagnosis not present

## 2022-04-15 DIAGNOSIS — Z8673 Personal history of transient ischemic attack (TIA), and cerebral infarction without residual deficits: Secondary | ICD-10-CM | POA: Diagnosis not present

## 2022-04-15 DIAGNOSIS — I5023 Acute on chronic systolic (congestive) heart failure: Secondary | ICD-10-CM | POA: Diagnosis not present

## 2022-04-15 DIAGNOSIS — I110 Hypertensive heart disease with heart failure: Secondary | ICD-10-CM | POA: Diagnosis not present

## 2022-04-15 DIAGNOSIS — K219 Gastro-esophageal reflux disease without esophagitis: Secondary | ICD-10-CM | POA: Diagnosis not present

## 2022-04-15 DIAGNOSIS — I2510 Atherosclerotic heart disease of native coronary artery without angina pectoris: Secondary | ICD-10-CM | POA: Diagnosis not present

## 2022-04-15 DIAGNOSIS — L89152 Pressure ulcer of sacral region, stage 2: Secondary | ICD-10-CM | POA: Diagnosis not present

## 2022-04-15 DIAGNOSIS — I4892 Unspecified atrial flutter: Secondary | ICD-10-CM | POA: Diagnosis not present

## 2022-04-15 DIAGNOSIS — I480 Paroxysmal atrial fibrillation: Secondary | ICD-10-CM | POA: Diagnosis not present

## 2022-04-15 DIAGNOSIS — N179 Acute kidney failure, unspecified: Secondary | ICD-10-CM | POA: Diagnosis not present

## 2022-04-15 DIAGNOSIS — E876 Hypokalemia: Secondary | ICD-10-CM | POA: Diagnosis not present

## 2022-04-15 DIAGNOSIS — E039 Hypothyroidism, unspecified: Secondary | ICD-10-CM | POA: Diagnosis not present

## 2022-04-15 DIAGNOSIS — L22 Diaper dermatitis: Secondary | ICD-10-CM | POA: Diagnosis not present

## 2022-04-15 DIAGNOSIS — E119 Type 2 diabetes mellitus without complications: Secondary | ICD-10-CM | POA: Diagnosis not present

## 2022-04-15 DIAGNOSIS — Z7901 Long term (current) use of anticoagulants: Secondary | ICD-10-CM | POA: Diagnosis not present

## 2022-04-15 DIAGNOSIS — I2609 Other pulmonary embolism with acute cor pulmonale: Secondary | ICD-10-CM | POA: Diagnosis not present

## 2022-04-19 DIAGNOSIS — Z7401 Bed confinement status: Secondary | ICD-10-CM | POA: Diagnosis not present

## 2022-04-19 DIAGNOSIS — I4892 Unspecified atrial flutter: Secondary | ICD-10-CM | POA: Diagnosis not present

## 2022-04-19 DIAGNOSIS — I2510 Atherosclerotic heart disease of native coronary artery without angina pectoris: Secondary | ICD-10-CM | POA: Diagnosis not present

## 2022-04-19 DIAGNOSIS — L89152 Pressure ulcer of sacral region, stage 2: Secondary | ICD-10-CM | POA: Diagnosis not present

## 2022-04-19 DIAGNOSIS — I5023 Acute on chronic systolic (congestive) heart failure: Secondary | ICD-10-CM | POA: Diagnosis not present

## 2022-04-19 DIAGNOSIS — I2609 Other pulmonary embolism with acute cor pulmonale: Secondary | ICD-10-CM | POA: Diagnosis not present

## 2022-04-19 DIAGNOSIS — E119 Type 2 diabetes mellitus without complications: Secondary | ICD-10-CM | POA: Diagnosis not present

## 2022-04-19 DIAGNOSIS — E876 Hypokalemia: Secondary | ICD-10-CM | POA: Diagnosis not present

## 2022-04-19 DIAGNOSIS — Z8673 Personal history of transient ischemic attack (TIA), and cerebral infarction without residual deficits: Secondary | ICD-10-CM | POA: Diagnosis not present

## 2022-04-19 DIAGNOSIS — Z7901 Long term (current) use of anticoagulants: Secondary | ICD-10-CM | POA: Diagnosis not present

## 2022-04-19 DIAGNOSIS — L22 Diaper dermatitis: Secondary | ICD-10-CM | POA: Diagnosis not present

## 2022-04-19 DIAGNOSIS — I110 Hypertensive heart disease with heart failure: Secondary | ICD-10-CM | POA: Diagnosis not present

## 2022-04-19 DIAGNOSIS — I480 Paroxysmal atrial fibrillation: Secondary | ICD-10-CM | POA: Diagnosis not present

## 2022-04-19 DIAGNOSIS — K219 Gastro-esophageal reflux disease without esophagitis: Secondary | ICD-10-CM | POA: Diagnosis not present

## 2022-04-19 DIAGNOSIS — E039 Hypothyroidism, unspecified: Secondary | ICD-10-CM | POA: Diagnosis not present

## 2022-04-19 DIAGNOSIS — N179 Acute kidney failure, unspecified: Secondary | ICD-10-CM | POA: Diagnosis not present

## 2022-04-21 DIAGNOSIS — I4892 Unspecified atrial flutter: Secondary | ICD-10-CM | POA: Diagnosis not present

## 2022-04-21 DIAGNOSIS — N179 Acute kidney failure, unspecified: Secondary | ICD-10-CM | POA: Diagnosis not present

## 2022-04-21 DIAGNOSIS — E876 Hypokalemia: Secondary | ICD-10-CM | POA: Diagnosis not present

## 2022-04-21 DIAGNOSIS — I2510 Atherosclerotic heart disease of native coronary artery without angina pectoris: Secondary | ICD-10-CM | POA: Diagnosis not present

## 2022-04-21 DIAGNOSIS — Z8673 Personal history of transient ischemic attack (TIA), and cerebral infarction without residual deficits: Secondary | ICD-10-CM | POA: Diagnosis not present

## 2022-04-21 DIAGNOSIS — I2609 Other pulmonary embolism with acute cor pulmonale: Secondary | ICD-10-CM | POA: Diagnosis not present

## 2022-04-21 DIAGNOSIS — E119 Type 2 diabetes mellitus without complications: Secondary | ICD-10-CM | POA: Diagnosis not present

## 2022-04-21 DIAGNOSIS — I110 Hypertensive heart disease with heart failure: Secondary | ICD-10-CM | POA: Diagnosis not present

## 2022-04-21 DIAGNOSIS — E039 Hypothyroidism, unspecified: Secondary | ICD-10-CM | POA: Diagnosis not present

## 2022-04-21 DIAGNOSIS — I5023 Acute on chronic systolic (congestive) heart failure: Secondary | ICD-10-CM | POA: Diagnosis not present

## 2022-04-21 DIAGNOSIS — L22 Diaper dermatitis: Secondary | ICD-10-CM | POA: Diagnosis not present

## 2022-04-21 DIAGNOSIS — Z7901 Long term (current) use of anticoagulants: Secondary | ICD-10-CM | POA: Diagnosis not present

## 2022-04-21 DIAGNOSIS — Z7401 Bed confinement status: Secondary | ICD-10-CM | POA: Diagnosis not present

## 2022-04-21 DIAGNOSIS — I480 Paroxysmal atrial fibrillation: Secondary | ICD-10-CM | POA: Diagnosis not present

## 2022-04-21 DIAGNOSIS — L89152 Pressure ulcer of sacral region, stage 2: Secondary | ICD-10-CM | POA: Diagnosis not present

## 2022-04-21 DIAGNOSIS — K219 Gastro-esophageal reflux disease without esophagitis: Secondary | ICD-10-CM | POA: Diagnosis not present

## 2022-04-22 DIAGNOSIS — L89152 Pressure ulcer of sacral region, stage 2: Secondary | ICD-10-CM | POA: Diagnosis not present

## 2022-04-22 DIAGNOSIS — K219 Gastro-esophageal reflux disease without esophagitis: Secondary | ICD-10-CM | POA: Diagnosis not present

## 2022-04-22 DIAGNOSIS — R7989 Other specified abnormal findings of blood chemistry: Secondary | ICD-10-CM | POA: Diagnosis not present

## 2022-04-22 DIAGNOSIS — I480 Paroxysmal atrial fibrillation: Secondary | ICD-10-CM | POA: Diagnosis not present

## 2022-04-22 DIAGNOSIS — E1149 Type 2 diabetes mellitus with other diabetic neurological complication: Secondary | ICD-10-CM | POA: Diagnosis not present

## 2022-04-22 DIAGNOSIS — I2510 Atherosclerotic heart disease of native coronary artery without angina pectoris: Secondary | ICD-10-CM | POA: Diagnosis not present

## 2022-04-22 DIAGNOSIS — I110 Hypertensive heart disease with heart failure: Secondary | ICD-10-CM | POA: Diagnosis not present

## 2022-04-22 DIAGNOSIS — E119 Type 2 diabetes mellitus without complications: Secondary | ICD-10-CM | POA: Diagnosis not present

## 2022-04-22 DIAGNOSIS — Z7401 Bed confinement status: Secondary | ICD-10-CM | POA: Diagnosis not present

## 2022-04-22 DIAGNOSIS — Z7901 Long term (current) use of anticoagulants: Secondary | ICD-10-CM | POA: Diagnosis not present

## 2022-04-22 DIAGNOSIS — Z8673 Personal history of transient ischemic attack (TIA), and cerebral infarction without residual deficits: Secondary | ICD-10-CM | POA: Diagnosis not present

## 2022-04-22 DIAGNOSIS — I5023 Acute on chronic systolic (congestive) heart failure: Secondary | ICD-10-CM | POA: Diagnosis not present

## 2022-04-22 DIAGNOSIS — L22 Diaper dermatitis: Secondary | ICD-10-CM | POA: Diagnosis not present

## 2022-04-22 DIAGNOSIS — I2609 Other pulmonary embolism with acute cor pulmonale: Secondary | ICD-10-CM | POA: Diagnosis not present

## 2022-04-22 DIAGNOSIS — I5030 Unspecified diastolic (congestive) heart failure: Secondary | ICD-10-CM | POA: Diagnosis not present

## 2022-04-22 DIAGNOSIS — E876 Hypokalemia: Secondary | ICD-10-CM | POA: Diagnosis not present

## 2022-04-22 DIAGNOSIS — N179 Acute kidney failure, unspecified: Secondary | ICD-10-CM | POA: Diagnosis not present

## 2022-04-22 DIAGNOSIS — E039 Hypothyroidism, unspecified: Secondary | ICD-10-CM | POA: Diagnosis not present

## 2022-04-22 DIAGNOSIS — I4892 Unspecified atrial flutter: Secondary | ICD-10-CM | POA: Diagnosis not present

## 2022-04-27 DIAGNOSIS — L22 Diaper dermatitis: Secondary | ICD-10-CM | POA: Diagnosis not present

## 2022-04-27 DIAGNOSIS — I5023 Acute on chronic systolic (congestive) heart failure: Secondary | ICD-10-CM | POA: Diagnosis not present

## 2022-04-27 DIAGNOSIS — E876 Hypokalemia: Secondary | ICD-10-CM | POA: Diagnosis not present

## 2022-04-27 DIAGNOSIS — I4892 Unspecified atrial flutter: Secondary | ICD-10-CM | POA: Diagnosis not present

## 2022-04-27 DIAGNOSIS — N179 Acute kidney failure, unspecified: Secondary | ICD-10-CM | POA: Diagnosis not present

## 2022-04-27 DIAGNOSIS — Z7401 Bed confinement status: Secondary | ICD-10-CM | POA: Diagnosis not present

## 2022-04-27 DIAGNOSIS — I110 Hypertensive heart disease with heart failure: Secondary | ICD-10-CM | POA: Diagnosis not present

## 2022-04-27 DIAGNOSIS — I2609 Other pulmonary embolism with acute cor pulmonale: Secondary | ICD-10-CM | POA: Diagnosis not present

## 2022-04-27 DIAGNOSIS — K219 Gastro-esophageal reflux disease without esophagitis: Secondary | ICD-10-CM | POA: Diagnosis not present

## 2022-04-27 DIAGNOSIS — E119 Type 2 diabetes mellitus without complications: Secondary | ICD-10-CM | POA: Diagnosis not present

## 2022-04-27 DIAGNOSIS — L89152 Pressure ulcer of sacral region, stage 2: Secondary | ICD-10-CM | POA: Diagnosis not present

## 2022-04-27 DIAGNOSIS — I2510 Atherosclerotic heart disease of native coronary artery without angina pectoris: Secondary | ICD-10-CM | POA: Diagnosis not present

## 2022-04-27 DIAGNOSIS — Z7901 Long term (current) use of anticoagulants: Secondary | ICD-10-CM | POA: Diagnosis not present

## 2022-04-27 DIAGNOSIS — Z8673 Personal history of transient ischemic attack (TIA), and cerebral infarction without residual deficits: Secondary | ICD-10-CM | POA: Diagnosis not present

## 2022-04-27 DIAGNOSIS — I480 Paroxysmal atrial fibrillation: Secondary | ICD-10-CM | POA: Diagnosis not present

## 2022-04-27 DIAGNOSIS — E039 Hypothyroidism, unspecified: Secondary | ICD-10-CM | POA: Diagnosis not present

## 2022-04-28 DIAGNOSIS — Z7401 Bed confinement status: Secondary | ICD-10-CM | POA: Diagnosis not present

## 2022-04-28 DIAGNOSIS — L89152 Pressure ulcer of sacral region, stage 2: Secondary | ICD-10-CM | POA: Diagnosis not present

## 2022-04-28 DIAGNOSIS — I110 Hypertensive heart disease with heart failure: Secondary | ICD-10-CM | POA: Diagnosis not present

## 2022-04-28 DIAGNOSIS — Z8673 Personal history of transient ischemic attack (TIA), and cerebral infarction without residual deficits: Secondary | ICD-10-CM | POA: Diagnosis not present

## 2022-04-28 DIAGNOSIS — L22 Diaper dermatitis: Secondary | ICD-10-CM | POA: Diagnosis not present

## 2022-04-28 DIAGNOSIS — I480 Paroxysmal atrial fibrillation: Secondary | ICD-10-CM | POA: Diagnosis not present

## 2022-04-28 DIAGNOSIS — I4892 Unspecified atrial flutter: Secondary | ICD-10-CM | POA: Diagnosis not present

## 2022-04-28 DIAGNOSIS — E119 Type 2 diabetes mellitus without complications: Secondary | ICD-10-CM | POA: Diagnosis not present

## 2022-04-28 DIAGNOSIS — I2609 Other pulmonary embolism with acute cor pulmonale: Secondary | ICD-10-CM | POA: Diagnosis not present

## 2022-04-28 DIAGNOSIS — K219 Gastro-esophageal reflux disease without esophagitis: Secondary | ICD-10-CM | POA: Diagnosis not present

## 2022-04-28 DIAGNOSIS — Z7901 Long term (current) use of anticoagulants: Secondary | ICD-10-CM | POA: Diagnosis not present

## 2022-04-28 DIAGNOSIS — N179 Acute kidney failure, unspecified: Secondary | ICD-10-CM | POA: Diagnosis not present

## 2022-04-28 DIAGNOSIS — I5023 Acute on chronic systolic (congestive) heart failure: Secondary | ICD-10-CM | POA: Diagnosis not present

## 2022-04-28 DIAGNOSIS — E876 Hypokalemia: Secondary | ICD-10-CM | POA: Diagnosis not present

## 2022-04-28 DIAGNOSIS — I2510 Atherosclerotic heart disease of native coronary artery without angina pectoris: Secondary | ICD-10-CM | POA: Diagnosis not present

## 2022-04-28 DIAGNOSIS — E039 Hypothyroidism, unspecified: Secondary | ICD-10-CM | POA: Diagnosis not present

## 2022-04-29 DIAGNOSIS — E039 Hypothyroidism, unspecified: Secondary | ICD-10-CM | POA: Diagnosis not present

## 2022-04-29 DIAGNOSIS — I2609 Other pulmonary embolism with acute cor pulmonale: Secondary | ICD-10-CM | POA: Diagnosis not present

## 2022-04-29 DIAGNOSIS — L22 Diaper dermatitis: Secondary | ICD-10-CM | POA: Diagnosis not present

## 2022-04-29 DIAGNOSIS — I110 Hypertensive heart disease with heart failure: Secondary | ICD-10-CM | POA: Diagnosis not present

## 2022-04-29 DIAGNOSIS — K219 Gastro-esophageal reflux disease without esophagitis: Secondary | ICD-10-CM | POA: Diagnosis not present

## 2022-04-29 DIAGNOSIS — L89152 Pressure ulcer of sacral region, stage 2: Secondary | ICD-10-CM | POA: Diagnosis not present

## 2022-04-29 DIAGNOSIS — Z8673 Personal history of transient ischemic attack (TIA), and cerebral infarction without residual deficits: Secondary | ICD-10-CM | POA: Diagnosis not present

## 2022-04-29 DIAGNOSIS — I480 Paroxysmal atrial fibrillation: Secondary | ICD-10-CM | POA: Diagnosis not present

## 2022-04-29 DIAGNOSIS — N179 Acute kidney failure, unspecified: Secondary | ICD-10-CM | POA: Diagnosis not present

## 2022-04-29 DIAGNOSIS — E119 Type 2 diabetes mellitus without complications: Secondary | ICD-10-CM | POA: Diagnosis not present

## 2022-04-29 DIAGNOSIS — E876 Hypokalemia: Secondary | ICD-10-CM | POA: Diagnosis not present

## 2022-04-29 DIAGNOSIS — I2510 Atherosclerotic heart disease of native coronary artery without angina pectoris: Secondary | ICD-10-CM | POA: Diagnosis not present

## 2022-04-29 DIAGNOSIS — I5023 Acute on chronic systolic (congestive) heart failure: Secondary | ICD-10-CM | POA: Diagnosis not present

## 2022-04-29 DIAGNOSIS — Z7401 Bed confinement status: Secondary | ICD-10-CM | POA: Diagnosis not present

## 2022-04-29 DIAGNOSIS — Z7901 Long term (current) use of anticoagulants: Secondary | ICD-10-CM | POA: Diagnosis not present

## 2022-04-29 DIAGNOSIS — I4892 Unspecified atrial flutter: Secondary | ICD-10-CM | POA: Diagnosis not present

## 2022-05-02 ENCOUNTER — Other Ambulatory Visit (HOSPITAL_COMMUNITY): Payer: Self-pay

## 2022-05-03 ENCOUNTER — Encounter: Payer: Self-pay | Admitting: *Deleted

## 2022-05-03 ENCOUNTER — Other Ambulatory Visit (HOSPITAL_COMMUNITY): Payer: Self-pay

## 2022-05-03 ENCOUNTER — Telehealth: Payer: Self-pay | Admitting: *Deleted

## 2022-05-03 NOTE — Chronic Care Management (AMB) (Unsigned)
  Care Coordination  Outreach Note  05/03/2022 Name: Ashley [name] MRN: [id number] DOB: 07-31-1946   Care Coordination Outreach Attempts  An unsuccessful telephone outreach was attempted today to offer the patient information about available care coordination services as a benefit of their health plan.   Follow Up Plan:  Additional outreach attempts will be made to offer the patient care coordination information and services.   Encounter Outcome:  No Answer  Gwenevere Ghazi  Care Coordination Care Guide  Direct Dial: [phone]

## 2022-05-03 NOTE — Progress Notes (Signed)
Error

## 2022-05-04 ENCOUNTER — Other Ambulatory Visit (HOSPITAL_COMMUNITY): Payer: Self-pay

## 2022-05-04 DIAGNOSIS — K219 Gastro-esophageal reflux disease without esophagitis: Secondary | ICD-10-CM | POA: Diagnosis not present

## 2022-05-04 DIAGNOSIS — E119 Type 2 diabetes mellitus without complications: Secondary | ICD-10-CM | POA: Diagnosis not present

## 2022-05-04 DIAGNOSIS — I110 Hypertensive heart disease with heart failure: Secondary | ICD-10-CM | POA: Diagnosis not present

## 2022-05-04 DIAGNOSIS — Z8673 Personal history of transient ischemic attack (TIA), and cerebral infarction without residual deficits: Secondary | ICD-10-CM | POA: Diagnosis not present

## 2022-05-04 DIAGNOSIS — I4892 Unspecified atrial flutter: Secondary | ICD-10-CM | POA: Diagnosis not present

## 2022-05-04 DIAGNOSIS — Z7901 Long term (current) use of anticoagulants: Secondary | ICD-10-CM | POA: Diagnosis not present

## 2022-05-04 DIAGNOSIS — N179 Acute kidney failure, unspecified: Secondary | ICD-10-CM | POA: Diagnosis not present

## 2022-05-04 DIAGNOSIS — I480 Paroxysmal atrial fibrillation: Secondary | ICD-10-CM | POA: Diagnosis not present

## 2022-05-04 DIAGNOSIS — E039 Hypothyroidism, unspecified: Secondary | ICD-10-CM | POA: Diagnosis not present

## 2022-05-04 DIAGNOSIS — E876 Hypokalemia: Secondary | ICD-10-CM | POA: Diagnosis not present

## 2022-05-04 DIAGNOSIS — I2609 Other pulmonary embolism with acute cor pulmonale: Secondary | ICD-10-CM | POA: Diagnosis not present

## 2022-05-04 DIAGNOSIS — L89152 Pressure ulcer of sacral region, stage 2: Secondary | ICD-10-CM | POA: Diagnosis not present

## 2022-05-04 DIAGNOSIS — I2510 Atherosclerotic heart disease of native coronary artery without angina pectoris: Secondary | ICD-10-CM | POA: Diagnosis not present

## 2022-05-04 DIAGNOSIS — L22 Diaper dermatitis: Secondary | ICD-10-CM | POA: Diagnosis not present

## 2022-05-04 DIAGNOSIS — I5023 Acute on chronic systolic (congestive) heart failure: Secondary | ICD-10-CM | POA: Diagnosis not present

## 2022-05-04 DIAGNOSIS — Z7401 Bed confinement status: Secondary | ICD-10-CM | POA: Diagnosis not present

## 2022-05-04 NOTE — Chronic Care Management (AMB) (Signed)
  Care Coordination  Outreach Note  05/04/2022 Name: Giulliana Mcroberts MRN: [id number] DOB: 05/21/1946   Care Coordination Outreach Attempts A successful outreach attempt made.  Follow Up Plan:  No further outreach attempts will be made at this time. We have been unable to contact the patient to offer or enroll patient in care coordination services  Encounter Outcome:  Pt. Refused  [facility] Coordination Care Guide  Direct Dial: [phone]

## 2022-05-06 DIAGNOSIS — I4892 Unspecified atrial flutter: Secondary | ICD-10-CM | POA: Diagnosis not present

## 2022-05-06 DIAGNOSIS — E876 Hypokalemia: Secondary | ICD-10-CM | POA: Diagnosis not present

## 2022-05-06 DIAGNOSIS — I110 Hypertensive heart disease with heart failure: Secondary | ICD-10-CM | POA: Diagnosis not present

## 2022-05-06 DIAGNOSIS — Z7901 Long term (current) use of anticoagulants: Secondary | ICD-10-CM | POA: Diagnosis not present

## 2022-05-06 DIAGNOSIS — I2609 Other pulmonary embolism with acute cor pulmonale: Secondary | ICD-10-CM | POA: Diagnosis not present

## 2022-05-06 DIAGNOSIS — I5023 Acute on chronic systolic (congestive) heart failure: Secondary | ICD-10-CM | POA: Diagnosis not present

## 2022-05-06 DIAGNOSIS — Z7401 Bed confinement status: Secondary | ICD-10-CM | POA: Diagnosis not present

## 2022-05-06 DIAGNOSIS — E039 Hypothyroidism, unspecified: Secondary | ICD-10-CM | POA: Diagnosis not present

## 2022-05-06 DIAGNOSIS — L22 Diaper dermatitis: Secondary | ICD-10-CM | POA: Diagnosis not present

## 2022-05-06 DIAGNOSIS — K5901 Slow transit constipation: Secondary | ICD-10-CM | POA: Diagnosis not present

## 2022-05-06 DIAGNOSIS — L89152 Pressure ulcer of sacral region, stage 2: Secondary | ICD-10-CM | POA: Diagnosis not present

## 2022-05-06 DIAGNOSIS — I480 Paroxysmal atrial fibrillation: Secondary | ICD-10-CM | POA: Diagnosis not present

## 2022-05-06 DIAGNOSIS — I2510 Atherosclerotic heart disease of native coronary artery without angina pectoris: Secondary | ICD-10-CM | POA: Diagnosis not present

## 2022-05-06 DIAGNOSIS — Z8673 Personal history of transient ischemic attack (TIA), and cerebral infarction without residual deficits: Secondary | ICD-10-CM | POA: Diagnosis not present

## 2022-05-06 DIAGNOSIS — N179 Acute kidney failure, unspecified: Secondary | ICD-10-CM | POA: Diagnosis not present

## 2022-05-06 DIAGNOSIS — E119 Type 2 diabetes mellitus without complications: Secondary | ICD-10-CM | POA: Diagnosis not present

## 2022-05-06 DIAGNOSIS — K219 Gastro-esophageal reflux disease without esophagitis: Secondary | ICD-10-CM | POA: Diagnosis not present

## 2022-05-09 ENCOUNTER — Other Ambulatory Visit (HOSPITAL_COMMUNITY): Payer: Self-pay

## 2022-05-09 DIAGNOSIS — I5033 Acute on chronic diastolic (congestive) heart failure: Secondary | ICD-10-CM | POA: Diagnosis not present

## 2022-05-09 DIAGNOSIS — R54 Age-related physical debility: Secondary | ICD-10-CM | POA: Diagnosis not present

## 2022-05-09 MED ORDER — POTASSIUM CHLORIDE ER 10 MEQ PO TBCR
10.0000 meq | EXTENDED_RELEASE_TABLET | Freq: Every day | ORAL | 0 refills | Status: AC
  Filled 2022-05-09: qty 30, 30d supply, fill #0

## 2022-05-09 MED ORDER — FAMOTIDINE 20 MG PO TABS
20.0000 mg | ORAL_TABLET | Freq: Every evening | ORAL | 0 refills | Status: AC | PRN
  Filled 2022-05-09: qty 30, 30d supply, fill #0

## 2022-05-09 MED ORDER — MIRTAZAPINE 15 MG PO TABS
15.0000 mg | ORAL_TABLET | Freq: Every day | ORAL | 2 refills | Status: AC
  Filled 2022-05-09: qty 30, 30d supply, fill #0

## 2022-05-10 DIAGNOSIS — E039 Hypothyroidism, unspecified: Secondary | ICD-10-CM | POA: Diagnosis not present

## 2022-05-10 DIAGNOSIS — Z7901 Long term (current) use of anticoagulants: Secondary | ICD-10-CM | POA: Diagnosis not present

## 2022-05-10 DIAGNOSIS — I5023 Acute on chronic systolic (congestive) heart failure: Secondary | ICD-10-CM | POA: Diagnosis not present

## 2022-05-10 DIAGNOSIS — N179 Acute kidney failure, unspecified: Secondary | ICD-10-CM | POA: Diagnosis not present

## 2022-05-10 DIAGNOSIS — I110 Hypertensive heart disease with heart failure: Secondary | ICD-10-CM | POA: Diagnosis not present

## 2022-05-10 DIAGNOSIS — E119 Type 2 diabetes mellitus without complications: Secondary | ICD-10-CM | POA: Diagnosis not present

## 2022-05-10 DIAGNOSIS — Z8673 Personal history of transient ischemic attack (TIA), and cerebral infarction without residual deficits: Secondary | ICD-10-CM | POA: Diagnosis not present

## 2022-05-10 DIAGNOSIS — I2609 Other pulmonary embolism with acute cor pulmonale: Secondary | ICD-10-CM | POA: Diagnosis not present

## 2022-05-10 DIAGNOSIS — I480 Paroxysmal atrial fibrillation: Secondary | ICD-10-CM | POA: Diagnosis not present

## 2022-05-10 DIAGNOSIS — I4892 Unspecified atrial flutter: Secondary | ICD-10-CM | POA: Diagnosis not present

## 2022-05-10 DIAGNOSIS — K219 Gastro-esophageal reflux disease without esophagitis: Secondary | ICD-10-CM | POA: Diagnosis not present

## 2022-05-10 DIAGNOSIS — E876 Hypokalemia: Secondary | ICD-10-CM | POA: Diagnosis not present

## 2022-05-10 DIAGNOSIS — L22 Diaper dermatitis: Secondary | ICD-10-CM | POA: Diagnosis not present

## 2022-05-10 DIAGNOSIS — Z7401 Bed confinement status: Secondary | ICD-10-CM | POA: Diagnosis not present

## 2022-05-10 DIAGNOSIS — I2510 Atherosclerotic heart disease of native coronary artery without angina pectoris: Secondary | ICD-10-CM | POA: Diagnosis not present

## 2022-05-10 DIAGNOSIS — L89152 Pressure ulcer of sacral region, stage 2: Secondary | ICD-10-CM | POA: Diagnosis not present

## 2022-05-11 DIAGNOSIS — L89152 Pressure ulcer of sacral region, stage 2: Secondary | ICD-10-CM | POA: Diagnosis not present

## 2022-05-11 DIAGNOSIS — Z7401 Bed confinement status: Secondary | ICD-10-CM | POA: Diagnosis not present

## 2022-05-11 DIAGNOSIS — E039 Hypothyroidism, unspecified: Secondary | ICD-10-CM | POA: Diagnosis not present

## 2022-05-11 DIAGNOSIS — K219 Gastro-esophageal reflux disease without esophagitis: Secondary | ICD-10-CM | POA: Diagnosis not present

## 2022-05-11 DIAGNOSIS — I4892 Unspecified atrial flutter: Secondary | ICD-10-CM | POA: Diagnosis not present

## 2022-05-11 DIAGNOSIS — I480 Paroxysmal atrial fibrillation: Secondary | ICD-10-CM | POA: Diagnosis not present

## 2022-05-11 DIAGNOSIS — Z8673 Personal history of transient ischemic attack (TIA), and cerebral infarction without residual deficits: Secondary | ICD-10-CM | POA: Diagnosis not present

## 2022-05-11 DIAGNOSIS — I2510 Atherosclerotic heart disease of native coronary artery without angina pectoris: Secondary | ICD-10-CM | POA: Diagnosis not present

## 2022-05-11 DIAGNOSIS — E876 Hypokalemia: Secondary | ICD-10-CM | POA: Diagnosis not present

## 2022-05-11 DIAGNOSIS — I2609 Other pulmonary embolism with acute cor pulmonale: Secondary | ICD-10-CM | POA: Diagnosis not present

## 2022-05-11 DIAGNOSIS — N179 Acute kidney failure, unspecified: Secondary | ICD-10-CM | POA: Diagnosis not present

## 2022-05-11 DIAGNOSIS — E119 Type 2 diabetes mellitus without complications: Secondary | ICD-10-CM | POA: Diagnosis not present

## 2022-05-11 DIAGNOSIS — I110 Hypertensive heart disease with heart failure: Secondary | ICD-10-CM | POA: Diagnosis not present

## 2022-05-11 DIAGNOSIS — L22 Diaper dermatitis: Secondary | ICD-10-CM | POA: Diagnosis not present

## 2022-05-11 DIAGNOSIS — I5023 Acute on chronic systolic (congestive) heart failure: Secondary | ICD-10-CM | POA: Diagnosis not present

## 2022-05-11 DIAGNOSIS — Z7901 Long term (current) use of anticoagulants: Secondary | ICD-10-CM | POA: Diagnosis not present

## 2022-05-12 DIAGNOSIS — I2609 Other pulmonary embolism with acute cor pulmonale: Secondary | ICD-10-CM | POA: Diagnosis not present

## 2022-05-12 DIAGNOSIS — E119 Type 2 diabetes mellitus without complications: Secondary | ICD-10-CM | POA: Diagnosis not present

## 2022-05-12 DIAGNOSIS — N179 Acute kidney failure, unspecified: Secondary | ICD-10-CM | POA: Diagnosis not present

## 2022-05-12 DIAGNOSIS — I4892 Unspecified atrial flutter: Secondary | ICD-10-CM | POA: Diagnosis not present

## 2022-05-12 DIAGNOSIS — L22 Diaper dermatitis: Secondary | ICD-10-CM | POA: Diagnosis not present

## 2022-05-12 DIAGNOSIS — I2510 Atherosclerotic heart disease of native coronary artery without angina pectoris: Secondary | ICD-10-CM | POA: Diagnosis not present

## 2022-05-12 DIAGNOSIS — Z8673 Personal history of transient ischemic attack (TIA), and cerebral infarction without residual deficits: Secondary | ICD-10-CM | POA: Diagnosis not present

## 2022-05-12 DIAGNOSIS — E039 Hypothyroidism, unspecified: Secondary | ICD-10-CM | POA: Diagnosis not present

## 2022-05-12 DIAGNOSIS — Z7401 Bed confinement status: Secondary | ICD-10-CM | POA: Diagnosis not present

## 2022-05-12 DIAGNOSIS — I5023 Acute on chronic systolic (congestive) heart failure: Secondary | ICD-10-CM | POA: Diagnosis not present

## 2022-05-12 DIAGNOSIS — L89152 Pressure ulcer of sacral region, stage 2: Secondary | ICD-10-CM | POA: Diagnosis not present

## 2022-05-12 DIAGNOSIS — Z7901 Long term (current) use of anticoagulants: Secondary | ICD-10-CM | POA: Diagnosis not present

## 2022-05-12 DIAGNOSIS — K219 Gastro-esophageal reflux disease without esophagitis: Secondary | ICD-10-CM | POA: Diagnosis not present

## 2022-05-12 DIAGNOSIS — I480 Paroxysmal atrial fibrillation: Secondary | ICD-10-CM | POA: Diagnosis not present

## 2022-05-12 DIAGNOSIS — I110 Hypertensive heart disease with heart failure: Secondary | ICD-10-CM | POA: Diagnosis not present

## 2022-05-12 DIAGNOSIS — E876 Hypokalemia: Secondary | ICD-10-CM | POA: Diagnosis not present

## 2022-05-14 ENCOUNTER — Emergency Department (HOSPITAL_COMMUNITY): Payer: Medicare Other

## 2022-05-14 ENCOUNTER — Other Ambulatory Visit: Payer: Self-pay

## 2022-05-14 ENCOUNTER — Encounter (HOSPITAL_COMMUNITY): Payer: Self-pay

## 2022-05-14 ENCOUNTER — Inpatient Hospital Stay (HOSPITAL_COMMUNITY)
Admission: EM | Admit: 2022-05-14 | Discharge: 2022-06-19 | DRG: 100 | Disposition: E | Payer: Medicare Other | Attending: Internal Medicine | Admitting: Internal Medicine

## 2022-05-14 ENCOUNTER — Encounter (HOSPITAL_COMMUNITY): Payer: Medicare Other

## 2022-05-14 DIAGNOSIS — R0902 Hypoxemia: Secondary | ICD-10-CM | POA: Diagnosis not present

## 2022-05-14 DIAGNOSIS — Z515 Encounter for palliative care: Secondary | ICD-10-CM

## 2022-05-14 DIAGNOSIS — Z823 Family history of stroke: Secondary | ICD-10-CM | POA: Diagnosis not present

## 2022-05-14 DIAGNOSIS — Z8249 Family history of ischemic heart disease and other diseases of the circulatory system: Secondary | ICD-10-CM | POA: Diagnosis not present

## 2022-05-14 DIAGNOSIS — E038 Other specified hypothyroidism: Secondary | ICD-10-CM | POA: Diagnosis not present

## 2022-05-14 DIAGNOSIS — Z7901 Long term (current) use of anticoagulants: Secondary | ICD-10-CM

## 2022-05-14 DIAGNOSIS — I110 Hypertensive heart disease with heart failure: Secondary | ICD-10-CM | POA: Diagnosis not present

## 2022-05-14 DIAGNOSIS — R52 Pain, unspecified: Secondary | ICD-10-CM | POA: Diagnosis not present

## 2022-05-14 DIAGNOSIS — Z79899 Other long term (current) drug therapy: Secondary | ICD-10-CM | POA: Diagnosis not present

## 2022-05-14 DIAGNOSIS — R0602 Shortness of breath: Secondary | ICD-10-CM | POA: Diagnosis not present

## 2022-05-14 DIAGNOSIS — I2510 Atherosclerotic heart disease of native coronary artery without angina pectoris: Secondary | ICD-10-CM | POA: Diagnosis present

## 2022-05-14 DIAGNOSIS — I639 Cerebral infarction, unspecified: Secondary | ICD-10-CM | POA: Diagnosis not present

## 2022-05-14 DIAGNOSIS — I959 Hypotension, unspecified: Secondary | ICD-10-CM | POA: Diagnosis present

## 2022-05-14 DIAGNOSIS — G9341 Metabolic encephalopathy: Secondary | ICD-10-CM | POA: Diagnosis not present

## 2022-05-14 DIAGNOSIS — I272 Pulmonary hypertension, unspecified: Secondary | ICD-10-CM | POA: Diagnosis not present

## 2022-05-14 DIAGNOSIS — Z66 Do not resuscitate: Secondary | ICD-10-CM | POA: Diagnosis not present

## 2022-05-14 DIAGNOSIS — E039 Hypothyroidism, unspecified: Secondary | ICD-10-CM | POA: Diagnosis not present

## 2022-05-14 DIAGNOSIS — L8990 Pressure ulcer of unspecified site, unspecified stage: Secondary | ICD-10-CM | POA: Insufficient documentation

## 2022-05-14 DIAGNOSIS — Z7401 Bed confinement status: Secondary | ICD-10-CM | POA: Diagnosis not present

## 2022-05-14 DIAGNOSIS — J90 Pleural effusion, not elsewhere classified: Secondary | ICD-10-CM | POA: Diagnosis not present

## 2022-05-14 DIAGNOSIS — Z79890 Hormone replacement therapy (postmenopausal): Secondary | ICD-10-CM

## 2022-05-14 DIAGNOSIS — I509 Heart failure, unspecified: Secondary | ICD-10-CM | POA: Diagnosis not present

## 2022-05-14 DIAGNOSIS — E119 Type 2 diabetes mellitus without complications: Secondary | ICD-10-CM | POA: Diagnosis present

## 2022-05-14 DIAGNOSIS — Z7189 Other specified counseling: Secondary | ICD-10-CM | POA: Diagnosis not present

## 2022-05-14 DIAGNOSIS — J9602 Acute respiratory failure with hypercapnia: Secondary | ICD-10-CM | POA: Diagnosis present

## 2022-05-14 DIAGNOSIS — I480 Paroxysmal atrial fibrillation: Secondary | ICD-10-CM | POA: Diagnosis present

## 2022-05-14 DIAGNOSIS — R079 Chest pain, unspecified: Secondary | ICD-10-CM | POA: Diagnosis not present

## 2022-05-14 DIAGNOSIS — R4182 Altered mental status, unspecified: Principal | ICD-10-CM

## 2022-05-14 DIAGNOSIS — Z6830 Body mass index (BMI) 30.0-30.9, adult: Secondary | ICD-10-CM | POA: Diagnosis not present

## 2022-05-14 DIAGNOSIS — K219 Gastro-esophageal reflux disease without esophagitis: Secondary | ICD-10-CM | POA: Diagnosis present

## 2022-05-14 DIAGNOSIS — Z711 Person with feared health complaint in whom no diagnosis is made: Secondary | ICD-10-CM | POA: Diagnosis not present

## 2022-05-14 DIAGNOSIS — E669 Obesity, unspecified: Secondary | ICD-10-CM | POA: Diagnosis present

## 2022-05-14 DIAGNOSIS — Z888 Allergy status to other drugs, medicaments and biological substances status: Secondary | ICD-10-CM

## 2022-05-14 DIAGNOSIS — I5042 Chronic combined systolic (congestive) and diastolic (congestive) heart failure: Secondary | ICD-10-CM | POA: Diagnosis not present

## 2022-05-14 DIAGNOSIS — J9601 Acute respiratory failure with hypoxia: Secondary | ICD-10-CM | POA: Diagnosis not present

## 2022-05-14 DIAGNOSIS — I10 Essential (primary) hypertension: Secondary | ICD-10-CM | POA: Diagnosis not present

## 2022-05-14 DIAGNOSIS — R569 Unspecified convulsions: Secondary | ICD-10-CM | POA: Diagnosis not present

## 2022-05-14 DIAGNOSIS — G319 Degenerative disease of nervous system, unspecified: Secondary | ICD-10-CM | POA: Diagnosis not present

## 2022-05-14 DIAGNOSIS — I252 Old myocardial infarction: Secondary | ICD-10-CM

## 2022-05-14 DIAGNOSIS — R54 Age-related physical debility: Secondary | ICD-10-CM | POA: Diagnosis not present

## 2022-05-14 DIAGNOSIS — L89152 Pressure ulcer of sacral region, stage 2: Secondary | ICD-10-CM | POA: Diagnosis present

## 2022-05-14 DIAGNOSIS — I4891 Unspecified atrial fibrillation: Secondary | ICD-10-CM | POA: Diagnosis not present

## 2022-05-14 DIAGNOSIS — Z86718 Personal history of other venous thrombosis and embolism: Secondary | ICD-10-CM

## 2022-05-14 DIAGNOSIS — L89159 Pressure ulcer of sacral region, unspecified stage: Secondary | ICD-10-CM | POA: Diagnosis not present

## 2022-05-14 DIAGNOSIS — H5500 Unspecified nystagmus: Secondary | ICD-10-CM | POA: Diagnosis present

## 2022-05-14 DIAGNOSIS — I69351 Hemiplegia and hemiparesis following cerebral infarction affecting right dominant side: Secondary | ICD-10-CM

## 2022-05-14 DIAGNOSIS — Z743 Need for continuous supervision: Secondary | ICD-10-CM | POA: Diagnosis not present

## 2022-05-14 DIAGNOSIS — Z91148 Patient's other noncompliance with medication regimen for other reason: Secondary | ICD-10-CM

## 2022-05-14 DIAGNOSIS — G40911 Epilepsy, unspecified, intractable, with status epilepticus: Secondary | ICD-10-CM | POA: Diagnosis present

## 2022-05-14 DIAGNOSIS — R6889 Other general symptoms and signs: Secondary | ICD-10-CM | POA: Diagnosis not present

## 2022-05-14 DIAGNOSIS — Z789 Other specified health status: Secondary | ICD-10-CM | POA: Diagnosis not present

## 2022-05-14 DIAGNOSIS — R638 Other symptoms and signs concerning food and fluid intake: Secondary | ICD-10-CM | POA: Diagnosis not present

## 2022-05-14 LAB — I-STAT ARTERIAL BLOOD GAS, ED
Acid-base deficit: 1 mmol/L (ref 0.0–2.0)
Bicarbonate: 28.5 mmol/L — ABNORMAL HIGH (ref 20.0–28.0)
Calcium, Ion: 1.27 mmol/L (ref 1.15–1.40)
HCT: 36 % (ref 36.0–46.0)
Hemoglobin: 12.2 g/dL (ref 12.0–15.0)
O2 Saturation: 100 %
Potassium: 3.5 mmol/L (ref 3.5–5.1)
Sodium: 139 mmol/L (ref 135–145)
TCO2: 31 mmol/L (ref 22–32)
pCO2 arterial: 69.1 mmHg (ref 32–48)
pH, Arterial: 7.224 — ABNORMAL LOW (ref 7.35–7.45)
pO2, Arterial: 245 mmHg — ABNORMAL HIGH (ref 83–108)

## 2022-05-14 LAB — DIFFERENTIAL
Abs Immature Granulocytes: 0.06 10*3/uL (ref 0.00–0.07)
Basophils Absolute: 0.1 10*3/uL (ref 0.0–0.1)
Basophils Relative: 1 %
Eosinophils Absolute: 0.1 10*3/uL (ref 0.0–0.5)
Eosinophils Relative: 1 %
Immature Granulocytes: 1 %
Lymphocytes Relative: 46 %
Lymphs Abs: 5.2 10*3/uL — ABNORMAL HIGH (ref 0.7–4.0)
Monocytes Absolute: 0.5 10*3/uL (ref 0.1–1.0)
Monocytes Relative: 5 %
Neutro Abs: 5 10*3/uL (ref 1.7–7.7)
Neutrophils Relative %: 46 %

## 2022-05-14 LAB — COMPREHENSIVE METABOLIC PANEL
ALT: 14 U/L (ref 0–44)
AST: 52 U/L — ABNORMAL HIGH (ref 15–41)
Albumin: 2.3 g/dL — ABNORMAL LOW (ref 3.5–5.0)
Alkaline Phosphatase: 77 U/L (ref 38–126)
Anion gap: 6 (ref 5–15)
BUN: 10 mg/dL (ref 8–23)
CO2: 27 mmol/L (ref 22–32)
Calcium: 8.7 mg/dL — ABNORMAL LOW (ref 8.9–10.3)
Chloride: 105 mmol/L (ref 98–111)
Creatinine, Ser: 0.86 mg/dL (ref 0.44–1.00)
GFR, Estimated: >60 mL/min (ref >60)
Glucose, Bld: 96 mg/dL (ref 70–99)
Potassium: 5 mmol/L (ref 3.5–5.1)
Sodium: 138 mmol/L (ref 135–145)
Total Bilirubin: 2.4 mg/dL — ABNORMAL HIGH (ref 0.3–1.2)
Total Protein: 6.6 g/dL (ref 6.5–8.1)

## 2022-05-14 LAB — CBC
HCT: 33 % — ABNORMAL LOW (ref 36.0–46.0)
Hemoglobin: 10.9 g/dL — ABNORMAL LOW (ref 12.0–15.0)
MCH: 25 pg — ABNORMAL LOW (ref 26.0–34.0)
MCHC: 33 g/dL (ref 30.0–36.0)
MCV: 75.7 fL — ABNORMAL LOW (ref 80.0–100.0)
Platelets: 314 10*3/uL (ref 150–400)
RBC: 4.36 MIL/uL (ref 3.87–5.11)
RDW: 19 % — ABNORMAL HIGH (ref 11.5–15.5)
WBC: 10.8 10*3/uL — ABNORMAL HIGH (ref 4.0–10.5)
nRBC: 0 % (ref 0.0–0.2)

## 2022-05-14 LAB — I-STAT CHEM 8, ED
BUN: 14 mg/dL (ref 8–23)
Calcium, Ion: 1.1 mmol/L — ABNORMAL LOW (ref 1.15–1.40)
Chloride: 105 mmol/L (ref 98–111)
Creatinine, Ser: 0.7 mg/dL (ref 0.44–1.00)
Glucose, Bld: 97 mg/dL (ref 70–99)
HCT: 39 % (ref 36.0–46.0)
Hemoglobin: 13.3 g/dL (ref 12.0–15.0)
Potassium: 4.5 mmol/L (ref 3.5–5.1)
Sodium: 139 mmol/L (ref 135–145)
TCO2: 28 mmol/L (ref 22–32)

## 2022-05-14 LAB — CBG MONITORING, ED: Glucose-Capillary: 95 mg/dL (ref 70–99)

## 2022-05-14 LAB — ETHANOL: Alcohol, Ethyl (B): <10 mg/dL (ref <10)

## 2022-05-14 LAB — APTT: aPTT: 29 seconds (ref 24–36)

## 2022-05-14 LAB — PROTIME-INR
INR: 1.7 — ABNORMAL HIGH (ref 0.8–1.2)
Prothrombin Time: 19.7 seconds — ABNORMAL HIGH (ref 11.4–15.2)

## 2022-05-14 MED ORDER — ORAL CARE MOUTH RINSE
15.0000 mL | OROMUCOSAL | Status: DC
  Administered 2022-05-14 – 2022-05-16 (×11): 15 mL via OROMUCOSAL

## 2022-05-14 MED ORDER — SODIUM CHLORIDE 0.9% FLUSH: 3.0000 mL | Freq: Once | INTRAVENOUS | Status: DC

## 2022-05-14 MED ORDER — FAMOTIDINE 20 MG PO TABS: 20.0000 mg | ORAL_TABLET | Freq: Every evening | ORAL | Status: DC | PRN

## 2022-05-14 MED ORDER — AMIODARONE HCL 200 MG PO TABS: 200.0000 mg | ORAL_TABLET | Freq: Every day | ORAL | Status: DC

## 2022-05-14 MED ORDER — POLYETHYLENE GLYCOL 3350 17 GM/SCOOP PO POWD
1.0000 | Freq: Every day | ORAL | Status: DC
  Filled 2022-05-14: qty 255

## 2022-05-14 MED ORDER — MIRTAZAPINE 15 MG PO TABS
15.0000 mg | ORAL_TABLET | Freq: Every day | ORAL | Status: DC
  Administered 2022-05-15 – 2022-05-16 (×2): 15 mg via ORAL
  Filled 2022-05-14 (×2): qty 1

## 2022-05-14 MED ORDER — LEVETIRACETAM IN NACL 500 MG/100ML IV SOLN
500.0000 mg | Freq: Two times a day (BID) | INTRAVENOUS | Status: DC
  Administered 2022-05-15 – 2022-05-16 (×5): 500 mg via INTRAVENOUS
  Filled 2022-05-14 (×5): qty 100

## 2022-05-14 MED ORDER — LEVOTHYROXINE SODIUM 25 MCG PO TABS
25.0000 ug | ORAL_TABLET | Freq: Every day | ORAL | Status: DC
  Administered 2022-05-16 – 2022-05-17 (×2): 25 ug via ORAL
  Filled 2022-05-14 (×2): qty 1

## 2022-05-14 MED ORDER — ACETAMINOPHEN 500 MG PO TABS
500.0000 mg | ORAL_TABLET | Freq: Every day | ORAL | Status: DC | PRN
Start: 2022-05-14 — End: 2022-05-18

## 2022-05-14 MED ORDER — ORAL CARE MOUTH RINSE: 15.0000 mL | OROMUCOSAL | Status: DC | PRN

## 2022-05-14 MED ORDER — APIXABAN 5 MG PO TABS
5.0000 mg | ORAL_TABLET | Freq: Two times a day (BID) | ORAL | Status: DC
  Administered 2022-05-15 – 2022-05-17 (×4): 5 mg via ORAL
  Filled 2022-05-14 (×4): qty 1

## 2022-05-14 MED ORDER — HYDRALAZINE HCL 20 MG/ML IJ SOLN
5.0000 mg | Freq: Four times a day (QID) | INTRAMUSCULAR | Status: DC | PRN
Start: 2022-05-14 — End: 2022-05-20

## 2022-05-14 MED ORDER — METOPROLOL TARTRATE 25 MG PO TABS: 12.5000 mg | ORAL_TABLET | Freq: Two times a day (BID) | ORAL | Status: DC

## 2022-05-14 MED ORDER — LORAZEPAM 2 MG/ML IJ SOLN
2.0000 mg | Freq: Once | INTRAMUSCULAR | Status: AC
  Administered 2022-05-14: 2 mg via INTRAVENOUS

## 2022-05-14 MED ORDER — SODIUM CHLORIDE 0.9 % IV SOLN
75.0000 mL/h | INTRAVENOUS | Status: DC
  Administered 2022-05-14: 75 mL/h via INTRAVENOUS

## 2022-05-14 MED ORDER — LEVETIRACETAM IN NACL 1000 MG/100ML IV SOLN
2000.0000 mg | INTRAVENOUS | Status: AC
  Administered 2022-05-14: 2000 mg via INTRAVENOUS
  Filled 2022-05-14: qty 200

## 2022-05-14 MED ORDER — FUROSEMIDE 10 MG/ML IJ SOLN
20.0000 mg | Freq: Once | INTRAMUSCULAR | Status: AC
  Administered 2022-05-14: 20 mg via INTRAVENOUS
  Filled 2022-05-14: qty 2

## 2022-05-14 MED ORDER — ROSUVASTATIN CALCIUM 5 MG PO TABS
5.0000 mg | ORAL_TABLET | Freq: Every day | ORAL | Status: DC
  Administered 2022-05-16 – 2022-05-17 (×2): 5 mg via ORAL
  Filled 2022-05-14 (×2): qty 1

## 2022-05-14 MED ORDER — AMIODARONE HCL 200 MG PO TABS
200.0000 mg | ORAL_TABLET | Freq: Every day | ORAL | Status: DC
  Administered 2022-05-16 – 2022-05-17 (×2): 200 mg via ORAL
  Filled 2022-05-14 (×2): qty 1

## 2022-05-14 MED ORDER — METOPROLOL SUCCINATE ER 25 MG PO TB24: 12.5000 mg | ORAL_TABLET | Freq: Two times a day (BID) | ORAL | Status: DC

## 2022-05-14 MED ORDER — METOPROLOL TARTRATE 5 MG/5ML IV SOLN
2.5000 mg | Freq: Four times a day (QID) | INTRAVENOUS | Status: DC | PRN
Start: 2022-05-14 — End: 2022-05-18

## 2022-05-14 MED ORDER — MIDODRINE HCL 5 MG PO TABS
5.0000 mg | ORAL_TABLET | Freq: Three times a day (TID) | ORAL | Status: DC
  Administered 2022-05-15 – 2022-05-17 (×6): 5 mg via ORAL
  Filled 2022-05-14 (×7): qty 1

## 2022-05-14 MED ORDER — ENOXAPARIN SODIUM 40 MG/0.4ML IJ SOSY: 40.0000 mg | PREFILLED_SYRINGE | INTRAMUSCULAR | Status: DC

## 2022-05-14 MED ORDER — SILDENAFIL CITRATE 20 MG PO TABS
20.0000 mg | ORAL_TABLET | Freq: Three times a day (TID) | ORAL | Status: DC
  Administered 2022-05-15 – 2022-05-17 (×5): 20 mg via ORAL
  Filled 2022-05-14 (×12): qty 1

## 2022-05-14 MED ORDER — POLYETHYLENE GLYCOL 3350 17 G PO PACK
17.0000 g | PACK | Freq: Every day | ORAL | Status: DC
  Administered 2022-05-16: 17 g via ORAL
  Filled 2022-05-14 (×2): qty 1

## 2022-05-14 NOTE — Consult Note (Signed)
Neurology Consultation  Reason for Consult: Code stroke-dysarthria, leftward gaze and left-sided weakness Referring Physician: Dr. Rodena Medin  CC: Left-sided weakness, leftward gaze and slurred speech  History is obtained from: Patient's son over the phone, EMS  HPI: Ashley [name] is a 76 y.o. female past medical history of atrial fibrillation on Eliquis who has been off of her medication and not taking it for a week, obesity, essential hypertension, modified Rankin of 5-bedbound at baseline from prior right MCA stroke and obesity related deficits, brought in by EMS when family called EMS to check in on the patient.  The patient lives by herself and is helped by a nurse.  Family watches over her on a ring camera.  They noted this morning that she had an upward gaze and was mumbling and her speech was not clear.  Last known well was sometime either yesterday afternoon or evening-completely unclear. Upon arrival to the ER, patient had a leftward gaze with nystagmus, she was flaccid on the left upper and lower extremity.  She was also very weak on the right lower extremity but could hold her right upper extremity up. Exam more localizable to a right hemispheric seizure than a stroke. Outside the window for IV thrombolysis Poor modified Rankin score for thrombectomy Given stat Ativan 2 mg and taken for scan  Last admission July 18 for acute on chronic heart failure along with AKI, pleural effusion right, PE and atrial fibrillation for which she was on anticoagulation which was continued-it was recommended that she be discharged to a long-term care facility due to her nonambulatory state and deconditioning but she decided to go home with home health services.  She at that time was deemed to be competent to make decisions and was aware of the risks of going home with poor assistance including rehospitalization and death.  Spoke with son who said during last hospitalization she made it clear that she  does not want intubation and is going to be a DNR/DNI   LKW: Sometime yesterday-unclear but definitely more than 4-1/2 hours and less than 24 hours. IV thrombolysis given?: no, greater than 4 and half hours Premorbid modified Rankin scale (mRS):5   ROS: Unable to obtain due to her mentation  Past Medical History:  Diagnosis Date   Essential hypertension 11/23/2018   Obesity 11/23/2018   Stroke [facility])      Family History  Problem Relation Age of Onset   Stroke Mother    Hypertension Mother      Social History:   reports that she has never smoked. She has never been exposed to tobacco smoke. She has never used smokeless tobacco. She reports that she does not drink alcohol and does not use drugs.  Medications  Current Facility-Administered Medications:    sodium chloride flush (NS) 0.9 % injection 3 mL, 3 mL, Intravenous, Once, Messick, Noralyn Pick, MD  Current Outpatient Medications:    acetaminophen (TYLENOL) 500 MG tablet, Take 500 mg by mouth daily as needed for mild pain or headache., Disp: , Rfl:    amiodarone (PACERONE) 200 MG tablet, Take 1 tablet (200 mg total) by mouth daily., Disp: 30 tablet, Rfl: 0   apixaban (ELIQUIS) 5 MG TABS tablet, Take 1 tablet (5 mg total) by mouth 2 (two) times daily., Disp: 60 tablet, Rfl: 0   famotidine (PEPCID) 20 MG tablet, Take 1 tablet (20 mg total) by mouth at bedtime as needed. (Patient taking differently: Take 20 mg by mouth at bedtime as needed for indigestion  or heartburn.), Disp: 30 tablet, Rfl: 3   famotidine (PEPCID) 20 MG tablet, Take 1 tablet (20 mg total) by mouth at bedtime as needed., Disp: 30 tablet, Rfl: 0   feeding supplement (ENSURE ENLIVE / ENSURE PLUS) LIQD, Take 237 mLs by mouth 2 (two) times daily between meals., Disp: 237 mL, Rfl: 0   levothyroxine (SYNTHROID) 25 MCG tablet, Take 1 tablet (25 mcg total) by mouth in the morning on an empty stomach, Disp: 30 tablet, Rfl: 0   metoprolol succinate (TOPROL-XL) 25 MG 24 hr  tablet, Take 0.5 tablets (12.5 mg total) by mouth 2 (two) times daily., Disp: 60 tablet, Rfl: 0   metoprolol tartrate (LOPRESSOR) 25 MG tablet, Take 1/2 tablet (12.5 mg total) by mouth 2 (two) times daily., Disp: 30 tablet, Rfl: 0   midodrine (PROAMATINE) 5 MG tablet, Take 1 tablet (5 mg total) by mouth 3 (three) times daily., Disp: 90 tablet, Rfl: 0   mirtazapine (REMERON) 15 MG tablet, Take 1 tablet (15 mg total) by mouth at bedtime., Disp: 30 tablet, Rfl: 3   mirtazapine (REMERON) 15 MG tablet, Take 1 tablet (15 mg total) by mouth at bedtime., Disp: 30 tablet, Rfl: 2   Multiple Vitamins-Minerals (CERTAVITE/ANTIOXIDANTS) TABS, Take 1 tablet by mouth daily as directed, Disp: 30 tablet, Rfl: 0   polyethylene glycol powder (MIRALAX) 17 GM/SCOOP powder, Mix 1 scoop with 8 ounces of fluid and take by mouth daily, Disp: 238 g, Rfl: 0   potassium chloride (KLOR-CON) 10 MEQ tablet, Take 1 tablet (10 mEq total) by mouth daily with food (Patient not taking: Reported on 03/26/2022), Disp: 30 tablet, Rfl: 1   potassium chloride (KLOR-CON) 10 MEQ tablet, Take 1 tablet (10 mEq total) by mouth daily., Disp: 30 tablet, Rfl: 0   rosuvastatin (CRESTOR) 5 MG tablet, Take 1 tablet (5 mg total) by mouth daily., Disp: 30 tablet, Rfl: 0   rosuvastatin (CRESTOR) 5 MG tablet, Take 1 tablet (5 mg total) by mouth daily., Disp: 30 tablet, Rfl: 0   sildenafil (REVATIO) 20 MG tablet, Take 1 tablet (20 mg total) by mouth 3 (three) times daily., Disp: 90 tablet, Rfl: 0   Exam: Current vital signs: BP 131/82   Pulse 73   Resp 18   Wt 93.2 kg   SpO2 100%   BMI 31.24 kg/m  Vital signs in last 24 hours: Pulse Rate:  [73-76] 73 (08/26 1100) Resp:  [18-22] 18 (08/26 1100) BP: (131)/(77-82) 131/82 (08/26 1100) SpO2:  [100 %] 100 % (08/26 1100) Weight:  [93.2 kg] 93.2 kg (08/26 1046) Generalized: Drowsy but opens eyes and attempts to communicate HEENT: Cephalic atraumatic Lungs: Diminished breath sounds  bilaterally Cardiovascular: Regular rhythm Abdomen: Obese, nontender Extremities: Chronic venous stasis changes in the lower extremities with some edema Neurological exam She is drowsy but opens eyes She attempts to communicate but is essentially nonverbal She is not able to follow any commands Cranial nerves: Pupils are equal round reactive light, gaze is forced to the left with fast component nystagmus to the right, does not blink to threat from the left but has some blink possibly from the right, face appears symmetric. Motor examination with a flaccid left upper extremity and a flaccid left lower extremity.  Minimal movement in the right lower extremity.  Full strength right upper extremity which she is able to hold antigravity for 10 seconds Sensation: Diminished response in terms of grimacing or withdrawal on the left in comparison to the right Coordination difficult to assess NIH  stroke scale 26  Labs I have reviewed labs in epic and the results pertinent to this consultation are:  CBC    Component Value Date/Time   WBC 10.8 (H) 05/12/2022 1050   RBC 4.36 04/22/2022 1050   HGB 10.9 (L) 04/24/2022 1050   HCT 33.0 (L) 04/25/2022 1050   PLT 314 05/18/2022 1050   MCV 75.7 (L) 05/13/2022 1050   MCH 25.0 (L) 05/17/2022 1050   MCHC 33.0 04/27/2022 1050   RDW 19.0 (H) 04/30/2022 1050   LYMPHSABS 5.2 (H) 05/13/2022 1050   MONOABS 0.5 05/02/2022 1050   EOSABS 0.1 05/18/2022 1050   BASOSABS 0.1 05/03/2022 1050   CMP     Component Value Date/Time   NA 139 05/06/2022 1046   K 4.5 05/13/2022 1046   CL 105 04/27/2022 1046   CO2 26 04/04/2022 0438   GLUCOSE 97 05/03/2022 1046   BUN 14 04/22/2022 1046   CREATININE 0.70 04/23/2022 1046   CALCIUM 8.6 (L) 04/04/2022 0438   PROT 6.0 (L) 03/26/2022 1030   ALBUMIN 2.1 (L) 03/26/2022 1030   AST 17 03/26/2022 1030   ALT 11 03/26/2022 1030   ALKPHOS 82 03/26/2022 1030   BILITOT 1.6 (H) 03/26/2022 1030   GFRNONAA 57 (L) 04/04/2022 0438    GFRAA >60 04/06/2020 1035    Lipid Panel     Component Value Date/Time   CHOL 163 04/05/2020 0334   TRIG 95 06/22/2021 0302   HDL 63 04/05/2020 0334   CHOLHDL 2.6 04/05/2020 0334   VLDL 13 04/05/2020 0334   LDLCALC 87 04/05/2020 0334     Imaging I have reviewed the images obtained:  CT-head: Aspects 10, chronic right MCA territory infarct.  Assessment: 76 year old with prior right MCA stroke with residual left-sided weakness presenting as a code stroke with concerns for leftward gaze with nystagmus and left-sided weakness making me suspicious for a seizure  emanating from the right hemisphere rather than a new stroke.  Seems like she has probably been having this for an unknown duration of time-theoretically have to consider her being in status epilepticus.  She has been out of her medications and was not taking her antihypertensives as well as anticoagulants for the last week-stroke remains in the realm of possibility but at this time, I would first treat the seizure and then do further work-up. 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  Became somnolent requiring supplemental oxygen after that. Is being carefully watched in the emergency room. I will get a stat EEG on her to rule out subclinical status epilepticus. She is extremely somnolent and is a DNR/DNI.  I will hold off on loading her with Keppra until her mental status improves some. MRI when able to I would imagine she will need an admission irrespective of the diagnosis for shorter duration of time  prior to figuring out long-term treatment plans and goals of care.  Plan discussed with Dr. Rodena Medin at patient's bedside.   ADDENDUM Stat EEG with NCSE Load with Keppra 2g IV Start Keppra 500 BID from tonight. Will need to have conversations with family regarding GOC if more  AEDs are needed as she may need intubation for that.  Will follow    -- Milon Dikes, MD Neurologist [facility] Pager: [phone]  CRITICAL CARE ATTESTATION Performed by: Milon Dikes, MD Total critical care time: 50 minutes Critical care time was exclusive of separately billable procedures and treating other patients and/or supervising APPs/Residents/Students Critical care was necessary to treat or prevent imminent or life-threatening deterioration due to seizures, status epilepticus, stroke evaluation This patient is critically ill and at significant risk for neurological worsening and/or death and care requires constant monitoring. Critical care was time spent personally by me on the following activities: development of treatment plan with patient and/or surrogate as well as nursing, discussions with consultants, evaluation of patient's response to treatment, examination of patient, obtaining history from patient or surrogate, ordering and performing treatments and interventions, ordering and review of laboratory studies, ordering and review of radiographic studies, pulse oximetry, re-evaluation of patient's condition, participation in multidisciplinary rounds and medical decision making of high complexity in the care of this patient.

## 2022-05-14 NOTE — ED Notes (Signed)
Attempted swallow screen, patient is unable to handle liquids. Patient is not alert enough to swallow at this time. Patient will remain NPO. Patient can not take PO medicaiton at this time

## 2022-05-14 NOTE — ED Provider Notes (Addendum)
[facility] EMERGENCY DEPARTMENT Provider Note   CSN: [id number] Arrival date & time: 04/26/2022  1037  An emergency department physician performed an initial assessment on this suspected stroke patient at 1039.  History  Chief Complaint  Patient presents with   Code Stroke    Ashley [name] is a 75 y.o. female.  76 year old female with prior medical history as detailed below presents for evaluation.  Patient called as a code stroke by EMS.  Patient seen on arrival by the neuro team.  Patient's presentation was thought to be consistent with possible seizure activity.  Patient was given 2 mg Ativan on arrival.   Subsequent to CT scan patient was noted to be most likely through seizing.  However she is obtunded.  She was noted to be mildly hypoxic.  DNR status confirmed by patient's family.   The history is provided by the patient and medical records.       Home Medications Prior to Admission medications   Medication Sig Start Date End Date Taking? Authorizing Provider  acetaminophen (TYLENOL) 500 MG tablet Take 500 mg by mouth daily as needed for mild pain or headache.    [provider]  amiodarone (PACERONE) 200 MG tablet Take 1 tablet (200 mg total) by mouth daily. 04/07/22     apixaban (ELIQUIS) 5 MG TABS tablet Take 1 tablet (5 mg total) by mouth 2 (two) times daily. 04/07/22     famotidine (PEPCID) 20 MG tablet Take 1 tablet (20 mg total) by mouth at bedtime as needed. Patient taking differently: Take 20 mg by mouth at bedtime as needed for indigestion or heartburn. 11/23/21     famotidine (PEPCID) 20 MG tablet Take 1 tablet (20 mg total) by mouth at bedtime as needed. 05/09/22     feeding supplement (ENSURE ENLIVE / ENSURE PLUS) LIQD Take 237 mLs by mouth 2 (two) times daily between meals. 04/05/22   Arrien, York Ram, MD  levothyroxine (SYNTHROID) 25 MCG tablet Take 1 tablet (25 mcg total) by mouth in the morning on an empty stomach  04/07/22     metoprolol succinate (TOPROL-XL) 25 MG 24 hr tablet Take 0.5 tablets (12.5 mg total) by mouth 2 (two) times daily. 04/07/22     metoprolol tartrate (LOPRESSOR) 25 MG tablet Take 1/2 tablet (12.5 mg total) by mouth 2 (two) times daily. 04/05/22 05/06/22  Arrien, York Ram, MD  midodrine (PROAMATINE) 5 MG tablet Take 1 tablet (5 mg total) by mouth 3 (three) times daily. 04/07/22     mirtazapine (REMERON) 15 MG tablet Take 1 tablet (15 mg total) by mouth at bedtime. 11/23/21     mirtazapine (REMERON) 15 MG tablet Take 1 tablet (15 mg total) by mouth at bedtime. 05/09/22     Multiple Vitamins-Minerals (CERTAVITE/ANTIOXIDANTS) TABS Take 1 tablet by mouth daily as directed 04/07/22     polyethylene glycol powder (MIRALAX) 17 GM/SCOOP powder Mix 1 scoop with 8 ounces of fluid and take by mouth daily 04/07/22     potassium chloride (KLOR-CON) 10 MEQ tablet Take 1 tablet (10 mEq total) by mouth daily with food Patient not taking: Reported on 03/26/2022 12/22/21     potassium chloride (KLOR-CON) 10 MEQ tablet Take 1 tablet (10 mEq total) by mouth daily. 05/09/22     rosuvastatin (CRESTOR) 5 MG tablet Take 1 tablet (5 mg total) by mouth daily. 10/14/21     rosuvastatin (CRESTOR) 5 MG tablet Take 1 tablet (5 mg total) by mouth daily. 04/07/22  sildenafil (REVATIO) 20 MG tablet Take 1 tablet (20 mg total) by mouth 3 (three) times daily. 04/07/22     furosemide (LASIX) 20 MG tablet Take 1 tablet (20 mg total) by mouth daily as needed for excessive swelling 04/07/22 04/07/22    losartan (COZAAR) 25 MG tablet Take 0.5 tablets (12.5 mg total) by mouth daily. 04/07/22 04/07/22    spironolactone (ALDACTONE) 25 MG tablet Take 1 tablet (25 mg total) by mouth daily. 04/07/22 04/07/22        Allergies    Bystolic [nebivolol hcl], Coreg [carvedilol], and Minoxidil    Review of Systems   Review of Systems  Unable to perform ROS: Acuity of condition    Physical Exam Updated Vital Signs BP 131/82   Pulse 73   Resp  18   Wt 93.2 kg   SpO2 100%   BMI 31.24 kg/m  Physical Exam Vitals and nursing note reviewed.  Constitutional:      General: She is not in acute distress.    Appearance: She is well-developed.     Comments: Obtunded post administration of Ativan and resolution of seizure-like activity  HENT:     Head: Normocephalic and atraumatic.  Eyes:     Conjunctiva/sclera: Conjunctivae normal.     Pupils: Pupils are equal, round, and reactive to light.  Cardiovascular:     Rate and Rhythm: Normal rate and regular rhythm.     Heart sounds: Normal heart sounds.  Pulmonary:     Effort: No respiratory distress.     Breath sounds: Normal breath sounds.     Comments: Mild upper airway obstruction cleared after repositioning. Abdominal:     General: There is no distension.     Palpations: Abdomen is soft.     Tenderness: There is no abdominal tenderness.  Musculoskeletal:        General: No deformity. Normal range of motion.     Cervical back: Normal range of motion and neck supple.  Skin:    General: Skin is warm and dry.  Neurological:     General: No focal deficit present.     Mental Status: She is oriented to person, place, and time.     ED Results / Procedures / Treatments   Labs (all labs ordered are listed, but only abnormal results are displayed) Labs Reviewed  PROTIME-INR - Abnormal; Notable for the following components:      Result Value   Prothrombin Time 19.7 (*)    INR 1.7 (*)    All other components within normal limits  CBC - Abnormal; Notable for the following components:   WBC 10.8 (*)    Hemoglobin 10.9 (*)    HCT 33.0 (*)    MCV 75.7 (*)    MCH 25.0 (*)    RDW 19.0 (*)    All other components within normal limits  DIFFERENTIAL - Abnormal; Notable for the following components:   Lymphs Abs 5.2 (*)    All other components within normal limits  COMPREHENSIVE METABOLIC PANEL - Abnormal; Notable for the following components:   Calcium 8.7 (*)    Albumin 2.3 (*)     AST 52 (*)    Total Bilirubin 2.4 (*)    All other components within normal limits  I-STAT CHEM 8, ED - Abnormal; Notable for the following components:   Calcium, Ion 1.10 (*)    All other components within normal limits  I-STAT ARTERIAL BLOOD GAS, ED - Abnormal; Notable for the following components:  pH, Arterial 7.224 (*)    pCO2 arterial 69.1 (*)    pO2, Arterial 245 (*)    Bicarbonate 28.5 (*)    All other components within normal limits  APTT  ETHANOL  CBG MONITORING, ED  CBG MONITORING, ED    EKG None  Radiology CT HEAD CODE STROKE WO CONTRAST  Result Date: 04/20/2022 CLINICAL DATA:  Code stroke. Neuro deficit with acute stroke suspected EXAM: CT HEAD WITHOUT CONTRAST TECHNIQUE: Contiguous axial images were obtained from the base of the skull through the vertex without intravenous contrast. RADIATION DOSE REDUCTION: This exam was performed according to the departmental dose-optimization program which includes automated exposure control, adjustment of the mA and/or kV according to patient size and/or use of iterative reconstruction technique. COMPARISON:  03/26/2022 FINDINGS: Brain: No evidence of acute infarction, hemorrhage, hydrocephalus, extra-axial collection or mass lesion/mass effect. Remote right temporoparietal cortex infarct. Mild cortical atrophy for age. Vascular: No hyperdense vessel or unexpected calcification. Skull: Normal. Negative for fracture or focal lesion. Sinuses/Orbits: Chronic right more than left mastoid opacification with negative nasopharynx. Other: Large right pleural effusion by scanogram, chronic based on prior imaging. These results were communicated to Dr. Wilford Corner at 11:02 am on 05/01/2022 by text page via the [facility] messaging system. ASPECTS [facility] Stroke Program Early CT Score) Not scored without localizing symptom. IMPRESSION: 1. No acute finding. 2. Remote right temporal parietal infarct. Electronically Signed   By: Tiburcio Pea M.D.   On:  05/13/2022 11:02    Procedures Procedures    Medications Ordered in ED Medications  sodium chloride flush (NS) 0.9 % injection 3 mL (3 mLs Intravenous Not Given 04/22/2022 1102)  LORazepam (ATIVAN) injection 2 mg (2 mg Intravenous Given 04/23/2022 1040)    ED Course/ Medical Decision Making/ A&P                           Medical Decision Making Amount and/or Complexity of Data Reviewed Labs: ordered. Radiology: ordered.  Risk Decision regarding hospitalization.    Medical Screen Complete  This patient presented to the ED with complaint of AMS, seizure.  This complaint involves an extensive number of treatment options. The initial differential diagnosis includes, but is not limited to, seizure, stroke, metabolic abnormality, etc.  This presentation is: Acute, Chronic, Self-Limited, Previously Undiagnosed, Uncertain Prognosis, Complicated, Systemic Symptoms, and Threat to Life/Bodily Function  Patient with history of A-fib on Eliquis, obesity, hypertension, history of right MCA stroke, presented with seizure-like activity.  Seen by neuro team on arrival.  2 mg Ativan given on arrival.  Subsequent to CT and administration of Ativan the patient became obtunded.  DNR status confirmed by family.  Patient will require admission for further work-up and treatment.  Neuro team will follow in consultation.  Additional history obtained:  Additional history obtained from EMS External records from outside sources obtained and reviewed including prior ED visits and prior Inpatient records.    Lab Tests:  I ordered and personally interpreted labs.  The pertinent results include:  CBC CMP ABG INR   Imaging Studies ordered:  I ordered imaging studies including ct head  I independently visualized and interpreted obtained imaging which showed nad I agree with the radiologist interpretation.   Cardiac Monitoring:  The patient was maintained on a cardiac monitor.  I personally  viewed and interpreted the cardiac monitor which showed an underlying rhythm of: NSR Problem List / ED Course:  AMS, Seizure   Reevaluation:  After  the interventions noted above, I reevaluated the patient and found that they have: improved  Disposition:  After consideration of the diagnostic results and the patients response to treatment, I feel that the patent would benefit from admission.    CRITICAL CARE Performed by: Wynetta Fines   Total critical care time: 30 minutes  Critical care time was exclusive of separately billable procedures and treating other patients.  Critical care was necessary to treat or prevent imminent or life-threatening deterioration.  Critical care was time spent personally by me on the following activities: development of treatment plan with patient and/or surrogate as well as nursing, discussions with consultants, evaluation of patient's response to treatment, examination of patient, obtaining history from patient or surrogate, ordering and performing treatments and interventions, ordering and review of laboratory studies, ordering and review of radiographic studies, pulse oximetry and re-evaluation of patient's condition.         Final Clinical Impression(s) / ED Diagnoses Final diagnoses:  Altered mental status, unspecified altered mental status type  Seizure [facility])    Rx / DC Orders ED Discharge Orders     None         Wynetta Fines, MD 04/21/2022 1246    Wynetta Fines, MD 05/16/2022 1246

## 2022-05-14 NOTE — Progress Notes (Signed)
LTM EEG hooked up and running - no initial skin breakdown - push button tested - neuro notified. [facility] monitoring.  

## 2022-05-14 NOTE — H&P (Addendum)
History and Physical    Ashley [name] QQV:[id number] DOB: 1945-12-10 DOA: 05/01/2022  PCP: Merri Brunette, MD (Confirm with patient/family/NH records and if not entered, this has to be entered at [facility] point of entry) Patient coming from: Home  I have personally briefly reviewed patient's old medical records in [facility]  Chief Complaint: Patient's somnolent  HPI: Ashley [name] is a 76 y.o. 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  Family observed the patient suddenly developed left-sided weakness this morning and called EMS.  Last known well was yesterday evening.  Family also reported that the patient ran out of all his medication including Eliquis and blood pressure medications for 1+ week.  ED Course: Code stroke was called.  On initial evaluation, neurology noticed patient had left-sided nystagmus as well as mentation changes, acute seizure was suspected and Ativan 2 mg IV push done.  Patient became somnolent afterwards and oxygen was placed on.  X-ray chest showed persistent right-sided pleural effusion and new left-sided pleural effusion versus atelectasis.  Review of Systems: Unable to perform, patient somnolent.  Past Medical History:  Diagnosis Date   Essential hypertension 11/23/2018   Obesity 11/23/2018   Stroke [facility])     Past Surgical History:  Procedure Laterality Date   CARDIOVERSION N/A 06/21/2021   Procedure: CARDIOVERSION;  Surgeon: Elder Negus, MD;  Location: [facility] ENDOSCOPY;  Service: Cardiovascular;  Laterality: N/A;   CARDIOVERSION N/A 03/31/2022   Procedure: CARDIOVERSION;  Surgeon: Yates Decamp, MD;  Location: [facility] ENDOSCOPY;  Service:  Cardiovascular;  Laterality: N/A;   LEFT HEART CATH AND CORONARY ANGIOGRAPHY N/A 07/29/2021   Procedure: LEFT HEART CATH AND CORONARY ANGIOGRAPHY;  Surgeon: Elder Negus, MD;  Location: [facility] INVASIVE CV LAB;  Service: Cardiovascular;  Laterality: N/A;   TEE WITHOUT CARDIOVERSION N/A 06/21/2021   Procedure: TRANSESOPHAGEAL ECHOCARDIOGRAM (TEE);  Surgeon: Elder Negus, MD;  Location: [facility] ENDOSCOPY;  Service: Cardiovascular;  Laterality: N/A;     reports that she has never smoked. She has never been exposed to tobacco smoke. She has never used smokeless tobacco. She reports that she does not drink alcohol and does not use drugs.  Allergies  Allergen Reactions   Bystolic [Nebivolol Hcl] Other (See Comments)    Bradycardia    Coreg [Carvedilol] Other (See Comments)    bradycardia   Minoxidil Other (See Comments)    Abdominal pain    Family History  Problem Relation Age of Onset   Stroke Mother    Hypertension Mother     Prior to Admission medications   Medication Sig Start Date End Date Taking? Authorizing Provider  acetaminophen (TYLENOL) 500 MG tablet Take 500 mg by mouth daily as needed for mild pain or headache.    [provider]  amiodarone (PACERONE) 200 MG tablet Take 1 tablet (200 mg total) by mouth daily. 04/07/22     apixaban (ELIQUIS) 5 MG TABS tablet Take 1 tablet (5 mg total) by mouth 2 (two) times daily. 04/07/22     famotidine (PEPCID) 20 MG tablet Take 1 tablet (20 mg total) by mouth at bedtime as needed. Patient taking differently: Take 20 mg by mouth at bedtime as needed for indigestion or heartburn. 11/23/21     famotidine (PEPCID) 20 MG tablet  Take 1 tablet (20 mg total) by mouth at bedtime as needed. 05/09/22     feeding supplement (ENSURE ENLIVE / ENSURE PLUS) LIQD Take 237 mLs by mouth 2 (two) times daily between meals. 04/05/22   Arrien, York RamMauricio Daniel, MD  levothyroxine (SYNTHROID) 25 MCG tablet Take 1 tablet (25 mcg total) by mouth in the  morning on an empty stomach 04/07/22     metoprolol succinate (TOPROL-XL) 25 MG 24 hr tablet Take 0.5 tablets (12.5 mg total) by mouth 2 (two) times daily. 04/07/22     metoprolol tartrate (LOPRESSOR) 25 MG tablet Take 1/2 tablet (12.5 mg total) by mouth 2 (two) times daily. 04/05/22 05/06/22  Arrien, York RamMauricio Daniel, MD  midodrine (PROAMATINE) 5 MG tablet Take 1 tablet (5 mg total) by mouth 3 (three) times daily. 04/07/22     mirtazapine (REMERON) 15 MG tablet Take 1 tablet (15 mg total) by mouth at bedtime. 11/23/21     mirtazapine (REMERON) 15 MG tablet Take 1 tablet (15 mg total) by mouth at bedtime. 05/09/22     Multiple Vitamins-Minerals (CERTAVITE/ANTIOXIDANTS) TABS Take 1 tablet by mouth daily as directed 04/07/22     polyethylene glycol powder (MIRALAX) 17 GM/SCOOP powder Mix 1 scoop with 8 ounces of fluid and take by mouth daily 04/07/22     potassium chloride (KLOR-CON) 10 MEQ tablet Take 1 tablet (10 mEq total) by mouth daily with food Patient not taking: Reported on 03/26/2022 12/22/21     potassium chloride (KLOR-CON) 10 MEQ tablet Take 1 tablet (10 mEq total) by mouth daily. 05/09/22     rosuvastatin (CRESTOR) 5 MG tablet Take 1 tablet (5 mg total) by mouth daily. 10/14/21     rosuvastatin (CRESTOR) 5 MG tablet Take 1 tablet (5 mg total) by mouth daily. 04/07/22     sildenafil (REVATIO) 20 MG tablet Take 1 tablet (20 mg total) by mouth 3 (three) times daily. 04/07/22     furosemide (LASIX) 20 MG tablet Take 1 tablet (20 mg total) by mouth daily as needed for excessive swelling 04/07/22 04/07/22    losartan (COZAAR) 25 MG tablet Take 0.5 tablets (12.5 mg total) by mouth daily. 04/07/22 04/07/22    spironolactone (ALDACTONE) 25 MG tablet Take 1 tablet (25 mg total) by mouth daily. 04/07/22 04/07/22      Physical Exam: Vitals:   2022-08-10 1145 2022-08-10 1200 2022-08-10 1215 2022-08-10 1245  BP: 121/72 107/82 (!) 135/59 (!) 144/100  Pulse: 65 (!) 58 (!) 52 (!) 53  Resp: (!) 23 (!) 23 19 19   SpO2: 100% 91% 100%  100%  Weight:        Constitutional: NAD, calm, comfortable Vitals:   2022-08-10 1145 2022-08-10 1200 2022-08-10 1215 2022-08-10 1245  BP: 121/72 107/82 (!) 135/59 (!) 144/100  Pulse: 65 (!) 58 (!) 52 (!) 53  Resp: (!) 23 (!) 23 19 19   SpO2: 100% 91% 100% 100%  Weight:       Eyes: PERRL, lids and conjunctivae normal ENMT: Mucous membranes are moist. Posterior pharynx clear of any exudate or lesions.Normal dentition.  Neck: normal, supple, no masses, no thyromegaly Respiratory: clear to auscultation bilaterally, no wheezing, fine crackles on bilateral lower fields.  Increasing respiratory effort. No accessory muscle use.  Cardiovascular: Regular rate and rhythm, no murmurs / rubs / gallops. 2+ extremity edema. 2+ pedal pulses. No carotid bruits.  Abdomen: no tenderness, no masses palpated. No hepatosplenomegaly. Bowel sounds positive.  Musculoskeletal: no clubbing / cyanosis. No joint deformity upper and lower extremities.  Good ROM, no contractures. Normal muscle tone.  Skin: no rashes, lesions, ulcers. No induration Neurologic: No facial droops, minimal movement of all limbs Psychiatric: Somnolent    Labs on Admission: I have personally reviewed following labs and imaging studies  CBC: Recent Labs  Lab 05/06/2022 1046 05/06/2022 1050 05/03/2022 1120  WBC  --  10.8*  --   NEUTROABS  --  5.0  --   HGB 13.3 10.9* 12.2  HCT 39.0 33.0* 36.0  MCV  --  75.7*  --   PLT  --  314  --    Basic Metabolic Panel: Recent Labs  Lab 05/05/2022 1046 05/07/2022 1050 05/13/2022 1120  NA 139 138 139  K 4.5 5.0 3.5  CL 105 105  --   CO2  --  27  --   GLUCOSE 97 96  --   BUN 14 10  --   CREATININE 0.70 0.86  --   CALCIUM  --  8.7*  --    GFR: Estimated Creatinine Clearance: 67.5 mL/min (by C-G formula based on SCr of 0.86 mg/dL). Liver Function Tests: Recent Labs  Lab 05/15/2022 1050  AST 52*  ALT 14  ALKPHOS 77  BILITOT 2.4*  PROT 6.6  ALBUMIN 2.3*   No results for input(s): "LIPASE",  "AMYLASE" in the last 168 hours. No results for input(s): "AMMONIA" in the last 168 hours. Coagulation Profile: Recent Labs  Lab 05/06/2022 1050  INR 1.7*   Cardiac Enzymes: No results for input(s): "CKTOTAL", "CKMB", "CKMBINDEX", "TROPONINI" in the last 168 hours. BNP (last 3 results) No results for input(s): "PROBNP" in the last 8760 hours. HbA1C: No results for input(s): "HGBA1C" in the last 72 hours. CBG: Recent Labs  Lab 04/22/2022 1042  GLUCAP 95   Lipid Profile: No results for input(s): "CHOL", "HDL", "LDLCALC", "TRIG", "CHOLHDL", "LDLDIRECT" in the last 72 hours. Thyroid Function Tests: No results for input(s): "TSH", "T4TOTAL", "FREET4", "T3FREE", "THYROIDAB" in the last 72 hours. Anemia Panel: No results for input(s): "VITAMINB12", "FOLATE", "FERRITIN", "TIBC", "IRON", "RETICCTPCT" in the last 72 hours. Urine analysis:    Component Value Date/Time   COLORURINE YELLOW 03/26/2022 1023   APPEARANCEUR CLEAR 03/26/2022 1023   LABSPEC 1.006 03/26/2022 1023   PHURINE 6.0 03/26/2022 1023   GLUCOSEU NEGATIVE 03/26/2022 1023   HGBUR SMALL (A) 03/26/2022 1023   BILIRUBINUR NEGATIVE 03/26/2022 1023   KETONESUR NEGATIVE 03/26/2022 1023   PROTEINUR NEGATIVE 03/26/2022 1023   NITRITE NEGATIVE 03/26/2022 1023   LEUKOCYTESUR LARGE (A) 03/26/2022 1023    Radiological Exams on Admission: EEG adult  Result Date: 04/26/2022 Charlsie Quest, MD     05/13/2022 12:56 PM Patient Name: Kristan Brummitt MRN: [id number] Epilepsy Attending: Charlsie Quest Referring Physician/Provider: Milon Dikes, MD Date: 05/05/2022 Duration: 22.43 mins Patient history: 49 F with dysarthria, leftward gaze and left-sided weakness. EEG to evaluate for seizure. Level of alertness: lethargic AEDs during EEG study: Ativan Technical aspects: This EEG study was done with scalp electrodes positioned according to the 10-20 International system of electrode placement. Electrical activity was reviewed with band  pass filter of 1-70Hz , sensitivity of 7 uV/mm, display speed of 46mm/sec with a 60Hz  notched filter applied as appropriate. EEG data were recorded continuously and digitally stored.  Video monitoring was available and reviewed as appropriate. Description: EEG showed 3-5Hz  theta-delta slowing admixed with spike and wave in right frontotemporal region with evolution in morphology and frequency lasting 8-30 seconds followed by attenuation. No clinical signs were seen. This EEG pattern  is consistent with focal non-convulsive status epilepticus. EEG also showed near continuous generalized 3-6hz  theta-delta slowing. Hyperventilation and photic stimulation were not performed.   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 Dr. Wilford Corner was notified. Charlsie Quest   DG Chest Port 1 View  Result Date: June 08, 2022 CLINICAL DATA:  Code stroke.  Shortness of breath. EXAM: PORTABLE CHEST 1 VIEW COMPARISON:  March 28, 2022 FINDINGS: A moderate to large right pleural effusion with underlying opacity is stable. Opacity in left base with obscuration left hemidiaphragm is new. The cardiomediastinal silhouette is stable. No pneumothorax. No other abnormalities. IMPRESSION: 1. New opacity in the retrocardiac region obscuring the left hemidiaphragm may represent a small effusion and atelectasis. Developing infiltrate not excluded. 2. Moderate to large right pleural effusion, stable. 3. No other interval changes. Electronically Signed   By: Gerome Sam III M.D.   On: 06-08-22 12:37   CT HEAD CODE STROKE WO CONTRAST  Result Date: 06-08-22 CLINICAL DATA:  Code stroke. Neuro deficit with acute stroke suspected EXAM: CT HEAD WITHOUT  CONTRAST TECHNIQUE: Contiguous axial images were obtained from the base of the skull through the vertex without intravenous contrast. RADIATION DOSE REDUCTION: This exam was performed according to the departmental dose-optimization program which includes automated exposure control, adjustment of the mA and/or kV according to patient size and/or use of iterative reconstruction technique. COMPARISON:  03/26/2022 FINDINGS: Brain: No evidence of acute infarction, hemorrhage, hydrocephalus, extra-axial collection or mass lesion/mass effect. Remote right temporoparietal cortex infarct. Mild cortical atrophy for age. Vascular: No hyperdense vessel or unexpected calcification. Skull: Normal. Negative for fracture or focal lesion. Sinuses/Orbits: Chronic right more than left mastoid opacification with negative nasopharynx. Other: Large right pleural effusion by scanogram, chronic based on prior imaging. These results were communicated to Dr. Wilford Corner at 11:02 am on 06/08/22 by text page via the [facility] messaging system. ASPECTS [facility] Stroke Program Early CT Score) Not scored without localizing symptom. IMPRESSION: 1. No acute finding. 2. Remote right temporal parietal infarct. Electronically Signed   By: Tiburcio Pea M.D.   On: 06/08/2022 11:02    EKG: Ordered  Assessment/Plan Principal Problem:   Seizure [facility]) Active Problems:   Acute metabolic encephalopathy  (please populate well all problems here in Problem List. (For example, if patient is on BP meds at home and you resume or decide to hold them, it is a problem that needs to be her. Same for CAD, COPD, HLD and so on)   Acute metabolic encephalopathy -Postictal versus status epilepticus -Neurology consultation appreciated, initial review of the first portion of EEG appears to have seizure-like activity.  Keppra loading as per neurology. -Seizure precaution, fall precaution. -Son updated, confirmed patient DNR DNI. -Other DDx, reevaluate after patient  mentation improves for neurological deficit to determine further MRI and other stroke work-up.  Acute hypoxic and hypercapnic respiratory failure -Secondary to acute encephalopathy and also appears to have element of acute on chronic HF R EF decompensation. -Encephalopathy work-up as above -For CHF decompensation, 20 mg Lasix given.  Reevaluate tomorrow for volume status and chest x-ray to determine further diuresis plan. -BiPAP contraindicated given her mentation, continue current nasal cannula oxygen support. IP when tolerated.  PAF -Sinus rhythm, continue Eliquis  Severe pulmonary hypertension -On sildenafil -Likely will need as needed Lasix  HTN -  Labile blood pressure on presentation, will hold off home BP meds, changed to as needed hydralazine for now  DVT prophylaxis: Eliquis Code Status: DNR Family Communication: Son over the phone Disposition Plan: Patient sick with likely status epilepticus, requiring IV seizure medication, expect more than 2 midnight hospital stay. Consults called: Neurology Admission status: PCU   Emeline General MD [facility] Pager [phone]  05-24-2022, 1:26 PM

## 2022-05-14 NOTE — Progress Notes (Signed)
EEG complete - results pending 

## 2022-05-14 NOTE — ED Notes (Signed)
EEG at bedside.

## 2022-05-14 NOTE — ED Notes (Signed)
Pt provided with perineal care, fresh bed sheets/chucks pad/ and brief. Patient's purewick replaced. Patient provided with warm blankets. Resting comfortably with RR even and regular. No other complaints at this time.

## 2022-05-14 NOTE — Procedures (Signed)
Patient Name: Ashley [name]  MRN: [id number]  Epilepsy Attending: Charlsie Quest  Referring Physician/Provider: Milon Dikes, MD  Date: 2022/06/04 Duration: 22.43 mins  Patient history: 56 F with dysarthria, leftward gaze and left-sided weakness. EEG to evaluate for seizure.  Level of alertness: lethargic   AEDs during EEG study: Ativan  Technical aspects: This EEG study was done with scalp electrodes positioned according to the 10-20 International system of electrode placement. Electrical activity was reviewed with band pass filter of 1-70Hz , sensitivity of 7 uV/mm, display speed of 40mm/sec with a 60Hz  notched filter applied as appropriate. EEG data were recorded continuously and digitally stored.  Video monitoring was available and reviewed as appropriate.  Description: EEG showed 3-5Hz  theta-delta slowing admixed with spike and wave in right frontotemporal region with evolution in morphology and frequency lasting 8-30 seconds followed by attenuation. No clinical signs were seen. This EEG pattern is consistent with focal non-convulsive status epilepticus. EEG also showed near continuous generalized 3-6hz  theta-delta slowing. Hyperventilation and photic stimulation were not performed.     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  Dr. was notified.  [name]

## 2022-05-14 NOTE — Code Documentation (Signed)
Stroke Response Nurse Documentation Code Documentation  Ashley [name] is a 76 y.o. female arriving to [facility]  via [facility] on 05/08/2022 with past medical hx of HTN, DM, AF, AKI, CHF. On Eliquis (apixaban) daily, she has not received her meds for about a week.  Code stroke was activated by EMS.   Patient from home where she was LKW at yesterday afternoon and now complaining of Left gaze and Left side paralysis .  She lives home alone but is bedridden and has home health.  Upon arrival to the ED she was seizing.  2mg  Ativan given IV.  O2 sats dropped into the 50s, placed patient on NRB O2 sats improved to 100%.  MD spoke with son, patient is DNR/DNI  Stroke team at the bedside on patient arrival. Labs drawn and patient cleared for CT by Dr. . Patient to CT with team. NIHSS 26, see documentation for details and code stroke times. Patient with decreased LOC, disoriented, not following commands, left gaze preference , right hemianopia, left arm weakness, left leg weakness, left decreased sensation, Global aphasia , and dysarthria  on exam. The following imaging was completed:  CT Head. Patient is not a candidate for IV Thrombolytic due to last known well yesterday. Patient is not a candidate for IR due to no LVO suspected.   Care Plan: VS and neuro checks q  2 hours.   Bedside handoff with ED RN Ashley [name].    Ashley [name]  Stroke Response RN

## 2022-05-14 NOTE — Progress Notes (Signed)
Upon reviewing the EEG, nonconvulsive status epilepticus seen.  I am treating her with a load of Keppra and starting her on Keppra. Her mental status has not improved. I am going to monitor her on video EEG. The son was contacted-she is very clear that she had made it extremely clear that she does not want intubation or resuscitation so we should avoid anything which makes [place] get to a point where she will need intubation. He understand the risks of not treating her seizures but says that she had made her wishes extremely clear.  Son reiterated that the patient remains DNR/DNI irrespective of any change in her clinical condition and it would be okay if she passes due to seizures or a cardiac event.   -- Milon Dikes, MD Neurologist [facility] Pager: [phone]

## 2022-05-15 ENCOUNTER — Inpatient Hospital Stay (HOSPITAL_COMMUNITY): Payer: Medicare Other

## 2022-05-15 DIAGNOSIS — J9601 Acute respiratory failure with hypoxia: Secondary | ICD-10-CM

## 2022-05-15 DIAGNOSIS — E119 Type 2 diabetes mellitus without complications: Secondary | ICD-10-CM

## 2022-05-15 DIAGNOSIS — I272 Pulmonary hypertension, unspecified: Secondary | ICD-10-CM

## 2022-05-15 DIAGNOSIS — G9341 Metabolic encephalopathy: Secondary | ICD-10-CM | POA: Diagnosis not present

## 2022-05-15 DIAGNOSIS — I10 Essential (primary) hypertension: Secondary | ICD-10-CM

## 2022-05-15 DIAGNOSIS — R569 Unspecified convulsions: Secondary | ICD-10-CM | POA: Diagnosis not present

## 2022-05-15 LAB — CBC
HCT: 30.7 % — ABNORMAL LOW (ref 36.0–46.0)
Hemoglobin: 10 g/dL — ABNORMAL LOW (ref 12.0–15.0)
MCH: 24.8 pg — ABNORMAL LOW (ref 26.0–34.0)
MCHC: 32.6 g/dL (ref 30.0–36.0)
MCV: 76 fL — ABNORMAL LOW (ref 80.0–100.0)
Platelets: 211 10*3/uL (ref 150–400)
RBC: 4.04 MIL/uL (ref 3.87–5.11)
RDW: 18.8 % — ABNORMAL HIGH (ref 11.5–15.5)
WBC: 7.7 10*3/uL (ref 4.0–10.5)
nRBC: 0 % (ref 0.0–0.2)

## 2022-05-15 LAB — BASIC METABOLIC PANEL
Anion gap: 9 (ref 5–15)
BUN: 10 mg/dL (ref 8–23)
CO2: 25 mmol/L (ref 22–32)
Calcium: 8.4 mg/dL — ABNORMAL LOW (ref 8.9–10.3)
Chloride: 106 mmol/L (ref 98–111)
Creatinine, Ser: 0.82 mg/dL (ref 0.44–1.00)
GFR, Estimated: >60 mL/min (ref >60)
Glucose, Bld: 89 mg/dL (ref 70–99)
Potassium: 3.4 mmol/L — ABNORMAL LOW (ref 3.5–5.1)
Sodium: 140 mmol/L (ref 135–145)

## 2022-05-15 MED ORDER — SODIUM CHLORIDE 0.9 % IV SOLN
INTRAVENOUS | Status: AC
Start: 2022-05-15 — End: 2022-05-16

## 2022-05-15 MED ORDER — POTASSIUM CHLORIDE 10 MEQ/100ML IV SOLN
10.0000 meq | INTRAVENOUS | Status: AC
  Administered 2022-05-15 (×5): 10 meq via INTRAVENOUS
  Filled 2022-05-15 (×5): qty 100

## 2022-05-15 NOTE — Progress Notes (Signed)
[facility] PROGRESS NOTE    Progress Note  Ashley [name]  [id number] DOB: 1946-08-17 DOA: 05/30/22 PCP: Merri Brunette, MD     Brief Narrative:   Ashley [name] is an 76 y.o. female past medical history significant for paroxysmal atrial fibrillation on Eliquis, chronic diastolic heart failure with persistent right-sided pleural effusion, severe hypertension, MCA stroke with residual right-sided weakness, bedbound sent by family members for left-sided weakness and slurred speech of sudden onset.  Neurology was consulted and suspected to seizures he was given Ativan and became somnolent oxygen drop placed on oxygen sats improved.  Chest x-ray showed persistent right-sided pleural effusion and new left-sided pleural effusion.    Assessment/Plan:   Acute metabolic encephalopathy probably secondary to Seizure [facility]) Neurology was consulted and there was concern for status, given IV Ativan started on Keppra. EEG showed nonconvulsive status epilepticus Confirmed DNR/DNI. Son made it very clear he does not want any heroic measures no intubation no resuscitation. Check an MRI of the brain. Speech to evaluate placed n.p.o.  Acute respiratory failure with hypoxia pox and hypercarbia: Secondary to a nonconvulsive seizures plus minus Ativan. She was given IV Lasix, BiPAP is contraindicated in the setting of mentation. Currently satting greater 96% on 2 L of oxygen. Patient appears comfortable in no acute distress. KVO IV fluids hold Lasix.  Paroxysmal atrial fibrillation: Sinus rhythm continue Eliquis rate control.  Severe pulmonary hypertension: On sildenafil.  Essential hypertension: We will hold antihypertensive medications for now, continue IV hydralazine as needed.  Sacral decubitus ulcer stage II present on admission: RN Pressure Injury Documentation: Pressure Injury 03/27/22 Coccyx Medial Stage 2 -  Partial thickness loss of dermis presenting as a shallow  open injury with a red, pink wound bed without slough. (Active)  03/27/22 1031  Location: Coccyx  Location Orientation: Medial  Staging: Stage 2 -  Partial thickness loss of dermis presenting as a shallow open injury with a red, pink wound bed without slough.  Wound Description (Comments):   Present on Admission: Yes    DVT prophylaxis: lovenox Family Communication:son Status is: Inpatient Remains inpatient appropriate because: Nonconvulsive seizures.    Code Status:     Code Status Orders  (From admission, onward)           Start     Ordered   2022-05-30 1255  Do not attempt resuscitation (DNR)  Continuous       Question Answer Comment  In the event of cardiac or respiratory ARREST Do not call a "code blue"   In the event of cardiac or respiratory ARREST Do not perform Intubation, CPR, defibrillation or ACLS   In the event of cardiac or respiratory ARREST Use medication by any route, position, wound care, and other measures to relive pain and suffering. May use oxygen, suction and manual treatment of airway obstruction as needed for comfort.      05-30-22 1254           Code Status History     Date Active Date Inactive Code Status Order ID Comments User Context   03/29/2022 0810 04/05/2022 2018 DNR [id number]  Zannie Cove, MD Inpatient   03/26/2022 1806 03/29/2022 0810 Full Code [id number]  Orland Mustard, MD ED   07/28/2021 2026 08/02/2021 2314 Full Code [id number]  Therisa Doyne, MD Inpatient   06/19/2021 1841 06/27/2021 1959 Full Code [id number]  Orland Mustard, MD Inpatient   04/04/2020 2238 04/06/2020 2047 Full Code [id number]  Shalhoub, Deno Lunger, MD ED  IV Access:   Peripheral IV   Procedures and diagnostic studies:   DG Chest Port 1 View  Result Date: 05/15/2022 CLINICAL DATA:  Shortness of breath. Diminished right breast sounds. EXAM: PORTABLE CHEST 1 VIEW COMPARISON:  May 15, 2022 at 7:15 a.m. FINDINGS: Persistent moderate right pleural  effusion with underlying opacity. Stable cardiomegaly. Possible pulmonary venous congestion/mild edema. The hila and mediastinum are unchanged. No pneumothorax. IMPRESSION: Stable moderate right pleural effusion with underlying opacity. Probable mild pulmonary venous congestion/edema. No other changes. Electronically Signed   By: Dorise Bullion III M.D.   On: 05/15/2022 07:55   EEG adult  Result Date: 05/05/2022 Lora Havens, MD     05/13/2022 12:56 PM Patient Name: Ashley [name] MRN: [id number] Epilepsy Attending: Lora Havens Referring Physician/Provider: Amie Portland, MD Date: 05/17/2022 Duration: 22.43 mins Patient history: 60 F with dysarthria, leftward gaze and left-sided weakness. EEG to evaluate for seizure. Level of alertness: lethargic AEDs during EEG study: Ativan Technical aspects: This EEG study was done with scalp electrodes positioned according to the 10-20 International system of electrode placement. Electrical activity was reviewed with band pass filter of 1-70Hz , sensitivity of 7 uV/mm, display speed of 55mm/sec with a 60Hz  notched filter applied as appropriate. EEG data were recorded continuously and digitally stored.  Video monitoring was available and reviewed as appropriate. Description: EEG showed 3-5Hz  theta-delta slowing admixed with spike and wave in right frontotemporal region with evolution in morphology and frequency lasting 8-30 seconds followed by attenuation. No clinical signs were seen. This EEG pattern is consistent with focal non-convulsive status epilepticus. EEG also showed near continuous generalized 3-6hz  theta-delta slowing. Hyperventilation and photic stimulation were not performed.   ABNORMALITY - Focal non-convulsive status epilepticus, right fronto-temporal region - Continuous slow, generalized IMPRESSION: This study showed focal non-convulsive status epilepticus arising from right fronto-temporal region additionally there is severe to profound diffuse  encephalopathy, nonspecific etiology but likely related to seizure. Dr. Rory Percy was notified. Lora Havens   DG Chest Port 1 View  Result Date: 05/12/2022 CLINICAL DATA:  Code stroke.  Shortness of breath. EXAM: PORTABLE CHEST 1 VIEW COMPARISON:  March 28, 2022 FINDINGS: A moderate to large right pleural effusion with underlying opacity is stable. Opacity in left base with obscuration left hemidiaphragm is new. The cardiomediastinal silhouette is stable. No pneumothorax. No other abnormalities. IMPRESSION: 1. New opacity in the retrocardiac region obscuring the left hemidiaphragm may represent a small effusion and atelectasis. Developing infiltrate not excluded. 2. Moderate to large right pleural effusion, stable. 3. No other interval changes. Electronically Signed   By: Dorise Bullion III M.D.   On: 05/13/2022 12:37   CT HEAD CODE STROKE WO CONTRAST  Result Date: 04/24/2022 CLINICAL DATA:  Code stroke. Neuro deficit with acute stroke suspected EXAM: CT HEAD WITHOUT CONTRAST TECHNIQUE: Contiguous axial images were obtained from the base of the skull through the vertex without intravenous contrast. RADIATION DOSE REDUCTION: This exam was performed according to the departmental dose-optimization program which includes automated exposure control, adjustment of the mA and/or kV according to patient size and/or use of iterative reconstruction technique. COMPARISON:  03/26/2022 FINDINGS: Brain: No evidence of acute infarction, hemorrhage, hydrocephalus, extra-axial collection or mass lesion/mass effect. Remote right temporoparietal cortex infarct. Mild cortical atrophy for age. Vascular: No hyperdense vessel or unexpected calcification. Skull: Normal. Negative for fracture or focal lesion. Sinuses/Orbits: Chronic right more than left mastoid opacification with negative nasopharynx. Other: Large right pleural effusion by scanogram, chronic based on prior  imaging. These results were communicated to Dr. Wilford Corner at  11:02 am on 2022-05-21 by text page via the [facility] messaging system. ASPECTS [facility] Stroke Program Early CT Score) Not scored without localizing symptom. IMPRESSION: 1. No acute finding. 2. Remote right temporal parietal infarct. Electronically Signed   By: Tiburcio Pea M.D.   On: 21-May-2022 11:02     Medical Consultants:   None.   Subjective:    [facility] no complaints  Objective:    Vitals:   05/15/22 0559 05/15/22 0600 05/15/22 0615 05/15/22 0730  BP: 138/85 (!) 165/92 (!) 161/74 (!) 148/78  Pulse: (!) 58  (!) 48 79  Resp: 18 18 13 20   Temp: (!) 97.5 F (36.4 C)     TempSrc:      SpO2: 96%  100% 100%  Weight:       SpO2: 100 % O2 Flow Rate (L/min): 2 L/min   Intake/Output Summary (Last 24 hours) at 05/15/2022 0819 Last data filed at 05/15/2022 0340 Gross per 24 hour  Intake 614.55 ml  Output --  Net 614.55 ml   Filed Weights   May 21, 2022 1046  Weight: 93.2 kg    Exam: General exam: In no acute distress. Respiratory system: Good air movement and clear to auscultation. Cardiovascular system: S1 & S2 heard, RRR. No JVD. Gastrointestinal system: Abdomen is nondistended, soft and nontender.  Extremities: No pedal edema. Skin: No rashes, lesions or ulcers Psychiatry: Judgement and insight appear normal. Mood & affect appropriate.    Data Reviewed:    Labs: Basic Metabolic Panel: Recent Labs  Lab 05/21/2022 1046 21-May-2022 1050 05/21/2022 1120 05/15/22 0430  NA 139 138 139 140  K 4.5 5.0 3.5 3.4*  CL 105 105  --  106  CO2  --  27  --  25  GLUCOSE 97 96  --  89  BUN 14 10  --  10  CREATININE 0.70 0.86  --  0.82  CALCIUM  --  8.7*  --  8.4*   GFR Estimated Creatinine Clearance: 70.7 mL/min (by C-G formula based on SCr of 0.82 mg/dL). Liver Function Tests: Recent Labs  Lab May 21, 2022 1050  AST 52*  ALT 14  ALKPHOS 77  BILITOT 2.4*  PROT 6.6  ALBUMIN 2.3*   No results for input(s): "LIPASE", "AMYLASE" in the last 168 hours. No results  for input(s): "AMMONIA" in the last 168 hours. Coagulation profile Recent Labs  Lab May 21, 2022 1050  INR 1.7*   COVID-19 Labs  No results for input(s): "DDIMER", "FERRITIN", "LDH", "CRP" in the last 72 hours.  Lab Results  Component Value Date   SARSCOV2NAA NEGATIVE 08/02/2021   SARSCOV2NAA NEGATIVE 07/28/2021   SARSCOV2NAA NEGATIVE 06/19/2021   SARSCOV2NAA NEGATIVE 04/04/2020    CBC: Recent Labs  Lab 2022-05-21 1046 2022-05-21 1050 05/21/22 1120 05/15/22 0430  WBC  --  10.8*  --  7.7  NEUTROABS  --  5.0  --   --   HGB 13.3 10.9* 12.2 10.0*  HCT 39.0 33.0* 36.0 30.7*  MCV  --  75.7*  --  76.0*  PLT  --  314  --  211   Cardiac Enzymes: No results for input(s): "CKTOTAL", "CKMB", "CKMBINDEX", "TROPONINI" in the last 168 hours. BNP (last 3 results) No results for input(s): "PROBNP" in the last 8760 hours. CBG: Recent Labs  Lab 05-21-22 1042  GLUCAP 95   D-Dimer: No results for input(s): "DDIMER" in the last 72 hours. Hgb A1c: No results for input(s): "HGBA1C"  in the last 72 hours. Lipid Profile: No results for input(s): "CHOL", "HDL", "LDLCALC", "TRIG", "CHOLHDL", "LDLDIRECT" in the last 72 hours. Thyroid function studies: No results for input(s): "TSH", "T4TOTAL", "T3FREE", "THYROIDAB" in the last 72 hours.  Invalid input(s): "FREET3" Anemia work up: No results for input(s): "VITAMINB12", "FOLATE", "FERRITIN", "TIBC", "IRON", "RETICCTPCT" in the last 72 hours. Sepsis Labs: Recent Labs  Lab 05/05/2022 1050 05/15/22 0430  WBC 10.8* 7.7   Microbiology No results found for this or any previous visit (from the past 240 hour(s)).   Medications:    amiodarone  200 mg Oral Daily   apixaban  5 mg Oral BID   levothyroxine  25 mcg Oral Q0600   midodrine  5 mg Oral TID   mirtazapine  15 mg Oral QHS   mouth rinse  15 mL Mouth Rinse Q2H   polyethylene glycol  17 g Oral Daily   rosuvastatin  5 mg Oral Daily   sildenafil  20 mg Oral TID   sodium chloride flush  3 mL  Intravenous Once   Continuous Infusions:  sodium chloride 75 mL/hr (05/15/22 0340)   levETIRAcetam Stopped (05/15/22 0340)      LOS: 1 day   Charlynne Cousins  [facility]  05/15/2022, 8:19 AM

## 2022-05-15 NOTE — ED Notes (Signed)
Patient continues to pull at wires, IV tubing, and Spo2 wires. Patient redirected and reconnected multiple times. Patient continues to remove self from monitor and purewick. Patient placed in hand mitts.

## 2022-05-15 NOTE — ED Notes (Signed)
Pt is axox2.

## 2022-05-15 NOTE — Progress Notes (Signed)
EEG maint complete.  ?

## 2022-05-15 NOTE — Progress Notes (Addendum)
Neurology Progress Note   S:// Seen and examined More awake and conversant   O:// Current vital signs: BP (!) 148/78   Pulse 79   Temp (!) 97.5 F (36.4 C)   Resp 20   Wt 93.2 kg   SpO2 100%   BMI 31.24 kg/m  Vital signs in last 24 hours: Temp:  [97.5 F (36.4 C)-97.6 F (36.4 C)] 97.5 F (36.4 C) (08/27 0559) Pulse Rate:  [33-124] 79 (08/27 0730) Resp:  [13-30] 20 (08/27 0730) BP: (107-169)/(52-129) 148/78 (08/27 0730) SpO2:  [91 %-100 %] 100 % (08/27 0730) Weight:  [93.2 kg] 93.2 kg (08/26 1046) General: More awake today, able to have a conversation HEENT: Normocephalic atraumatic Lungs: Clear Cardiovascular regular rhythm Neurological examination she is awake, alert, was able to tell me her name. She is not oriented to where she is or what month it is.  She thought she was at her home and provided me with her home address when I asked her where she is.  Mildly dysarthric speech.  Poor attention concentration.  No aphasia.  Cranial nerve examination reveals pupils are equal round react light, extract movements intact face that appears symmetric, tongue and palate midline.  Motor examination with antigravity strength in bilateral upper extremities and symmetric but weak bilateral lower extremities 2-3/5 bilaterally.  Sensation intact to light touch.  Coordination intact.   Medications  Current Facility-Administered Medications:    0.9 %  sodium chloride infusion, , Intravenous, Continuous, Marinda Elk, MD, Last Rate: 10 mL/hr at 05/15/22 0837, New Bag at 05/15/22 0837   acetaminophen (TYLENOL) tablet 500 mg, 500 mg, Oral, Daily PRN, Mikey College T, MD   amiodarone (PACERONE) tablet 200 mg, 200 mg, Oral, Daily, Zhang, Ping T, MD   apixaban (ELIQUIS) tablet 5 mg, 5 mg, Oral, BID, Mikey College T, MD   famotidine (PEPCID) tablet 20 mg, 20 mg, Oral, QHS PRN, Mikey College T, MD   hydrALAZINE (APRESOLINE) injection 5 mg, 5 mg, Intravenous, Q6H PRN, Mikey College T, MD    levETIRAcetam (KEPPRA) IVPB 500 mg/100 mL premix, 500 mg, Intravenous, Q12H, Milon Dikes, MD, Stopped at 05/15/22 0340   levothyroxine (SYNTHROID) tablet 25 mcg, 25 mcg, Oral, Q0600, Mikey College T, MD   metoprolol tartrate (LOPRESSOR) injection 2.5 mg, 2.5 mg, Intravenous, Q6H PRN, Mikey College T, MD   midodrine (PROAMATINE) tablet 5 mg, 5 mg, Oral, TID, Mikey College T, MD   mirtazapine (REMERON) tablet 15 mg, 15 mg, Oral, QHS, Emeline General, MD   Oral care mouth rinse, 15 mL, Mouth Rinse, Q2H, Mikey College T, MD, 15 mL at 05/15/22 0503   Oral care mouth rinse, 15 mL, Mouth Rinse, PRN, Mikey College T, MD   polyethylene glycol (MIRALAX / GLYCOLAX) packet 17 g, 17 g, Oral, Daily, Zhang, Ping T, MD   potassium chloride 10 mEq in 100 mL IVPB, 10 mEq, Intravenous, Q1 Hr x 5, Marinda Elk, MD, Last Rate: 100 mL/hr at 05/15/22 0838, 10 mEq at 05/15/22 0838   rosuvastatin (CRESTOR) tablet 5 mg, 5 mg, Oral, Daily, Mikey College T, MD   sildenafil (REVATIO) tablet 20 mg, 20 mg, Oral, TID, Mikey College T, MD   sodium chloride flush (NS) 0.9 % injection 3 mL, 3 mL, Intravenous, Once, Messick, Noralyn Pick, MD  Current Outpatient Medications:    acetaminophen (TYLENOL) 500 MG tablet, Take 500 mg by mouth daily as needed for mild pain or headache., Disp: , Rfl:    amiodarone (PACERONE) 200  MG tablet, Take 1 tablet (200 mg total) by mouth daily., Disp: 30 tablet, Rfl: 0   apixaban (ELIQUIS) 5 MG TABS tablet, Take 1 tablet (5 mg total) by mouth 2 (two) times daily., Disp: 60 tablet, Rfl: 0   famotidine (PEPCID) 20 MG tablet, Take 1 tablet (20 mg total) by mouth at bedtime as needed. (Patient taking differently: Take 20 mg by mouth at bedtime as needed for indigestion or heartburn.), Disp: 30 tablet, Rfl: 3   famotidine (PEPCID) 20 MG tablet, Take 1 tablet (20 mg total) by mouth at bedtime as needed., Disp: 30 tablet, Rfl: 0   feeding supplement (ENSURE ENLIVE / ENSURE PLUS) LIQD, Take 237 mLs by mouth 2 (two) times  daily between meals., Disp: 237 mL, Rfl: 0   levothyroxine (SYNTHROID) 25 MCG tablet, Take 1 tablet (25 mcg total) by mouth in the morning on an empty stomach, Disp: 30 tablet, Rfl: 0   metoprolol succinate (TOPROL-XL) 25 MG 24 hr tablet, Take 0.5 tablets (12.5 mg total) by mouth 2 (two) times daily., Disp: 60 tablet, Rfl: 0   metoprolol tartrate (LOPRESSOR) 25 MG tablet, Take 1/2 tablet (12.5 mg total) by mouth 2 (two) times daily., Disp: 30 tablet, Rfl: 0   midodrine (PROAMATINE) 5 MG tablet, Take 1 tablet (5 mg total) by mouth 3 (three) times daily., Disp: 90 tablet, Rfl: 0   mirtazapine (REMERON) 15 MG tablet, Take 1 tablet (15 mg total) by mouth at bedtime., Disp: 30 tablet, Rfl: 3   mirtazapine (REMERON) 15 MG tablet, Take 1 tablet (15 mg total) by mouth at bedtime., Disp: 30 tablet, Rfl: 2   Multiple Vitamins-Minerals (CERTAVITE/ANTIOXIDANTS) TABS, Take 1 tablet by mouth daily as directed, Disp: 30 tablet, Rfl: 0   polyethylene glycol powder (MIRALAX) 17 GM/SCOOP powder, Mix 1 scoop with 8 ounces of fluid and take by mouth daily, Disp: 238 g, Rfl: 0   potassium chloride (KLOR-CON) 10 MEQ tablet, Take 1 tablet (10 mEq total) by mouth daily with food (Patient not taking: Reported on 03/26/2022), Disp: 30 tablet, Rfl: 1   potassium chloride (KLOR-CON) 10 MEQ tablet, Take 1 tablet (10 mEq total) by mouth daily., Disp: 30 tablet, Rfl: 0   rosuvastatin (CRESTOR) 5 MG tablet, Take 1 tablet (5 mg total) by mouth daily., Disp: 30 tablet, Rfl: 0   rosuvastatin (CRESTOR) 5 MG tablet, Take 1 tablet (5 mg total) by mouth daily., Disp: 30 tablet, Rfl: 0   sildenafil (REVATIO) 20 MG tablet, Take 1 tablet (20 mg total) by mouth 3 (three) times daily., Disp: 90 tablet, Rfl: 0 Labs CBC    Component Value Date/Time   WBC 7.7 05/15/2022 0430   RBC 4.04 05/15/2022 0430   HGB 10.0 (L) 05/15/2022 0430   HCT 30.7 (L) 05/15/2022 0430   PLT 211 05/15/2022 0430   MCV 76.0 (L) 05/15/2022 0430   MCH 24.8 (L)  05/15/2022 0430   MCHC 32.6 05/15/2022 0430   RDW 18.8 (H) 05/15/2022 0430   LYMPHSABS 5.2 (H) May 19, 2022 1050   MONOABS 0.5 05-19-22 1050   EOSABS 0.1 05-19-2022 1050   BASOSABS 0.1 2022/05/19 1050    CMP     Component Value Date/Time   NA 140 05/15/2022 0430   K 3.4 (L) 05/15/2022 0430   CL 106 05/15/2022 0430   CO2 25 05/15/2022 0430   GLUCOSE 89 05/15/2022 0430   BUN 10 05/15/2022 0430   CREATININE 0.82 05/15/2022 0430   CALCIUM 8.4 (L) 05/15/2022 0430   PROT  6.6 May 29, 2022 1050   ALBUMIN 2.3 (L) 05-29-22 1050   AST 52 (H) 05-29-22 1050   ALT 14 2022/05/29 1050   ALKPHOS 77 05-29-2022 1050   BILITOT 2.4 (H) May 29, 2022 1050   GFRNONAA >60 05/15/2022 0430   GFRAA >60 04/06/2020 1035    glycosylated hemoglobin  Lipid Panel     Component Value Date/Time   CHOL 163 04/05/2020 0334   TRIG 95 06/22/2021 0302   HDL 63 04/05/2020 0334   CHOLHDL 2.6 04/05/2020 0334   VLDL 13 04/05/2020 0334   LDLCALC 87 04/05/2020 0334     Imaging I have reviewed images in epic and the results pertinent to this consultation are: CT head aspects 10, chronic right MCA territory infarct.  Assessment: 76 year old with prior right MCA stroke and residual left-sided weakness presenting as a code stroke with concerns for leftward gaze and nystagmus and left-sided weakness with clinical picture consistent with a seizure emanating from the right hemisphere.  This was confirmed with an EEG that showed nonconvulsive status epilepticus emanating from the right frontoparietal regions.  She was given benzodiazepines and loaded with Keppra and started on Keppra.  She has made her wishes extremely clear to the son where she does not want any intubation or resuscitation-she is a DNR/DNI. Today her exam is much better than yesterday.  She is awake alert although still not fully oriented but I am not sure if at baseline she is any different or this might be a prolonged postictal state.  Impression:  Post stroke epilepsy with status epilepticus which is now resolved  Recommendations: Continue Keppra Continue seizure precautions If the LTM EEG negative overnight, can be removed.  If there are still ongoing concern for seizure on the LTM EEG, would continue it for 1 more day.  Plan was relayed to the hospitalist via secure chat.  -- Milon Dikes, MD Neurologist [facility] Pager: [phone]   ADDENDUM EEG completed overnight. Noted to have evidence of epileptogenicity arising from the right hemisphere, maximal frontotemporal region likely due to underlying structural abnormality as well as increased risk of seizure recurrence.  Additionally moderate to severe profound diffuse encephalopathy nonspecific etiology but likely related to seizure.  Given that the EEG still suggest there might be high seizure recurrence, we will leave her on LTM EEG for 1 more day.  Continue on current antiepileptic for now  Neurology will follow with you.  -- Milon Dikes, MD Neurologist [facility] Pager: [phone]

## 2022-05-15 NOTE — Evaluation (Signed)
Clinical/Bedside Swallow Evaluation Patient Details  Name: Ashley [name] MRN: [id number] Date of Birth: 03-06-1946  Today's Date: 05/15/2022 Time: SLP Start Time (ACUTE ONLY): 1240 SLP Stop Time (ACUTE ONLY): 1255 SLP Time Calculation (min) (ACUTE ONLY): 15 min  Past Medical History:  Past Medical History:  Diagnosis Date   Essential hypertension 11/23/2018   Obesity 11/23/2018   Stroke [facility])    Past Surgical History:  Past Surgical History:  Procedure Laterality Date   CARDIOVERSION N/A 06/21/2021   Procedure: CARDIOVERSION;  Surgeon: Elder Negus, MD;  Location: [facility] ENDOSCOPY;  Service: Cardiovascular;  Laterality: N/A;   CARDIOVERSION N/A 03/31/2022   Procedure: CARDIOVERSION;  Surgeon: Yates Decamp, MD;  Location: [facility] ENDOSCOPY;  Service: Cardiovascular;  Laterality: N/A;   LEFT HEART CATH AND CORONARY ANGIOGRAPHY N/A 07/29/2021   Procedure: LEFT HEART CATH AND CORONARY ANGIOGRAPHY;  Surgeon: Elder Negus, MD;  Location: [facility] INVASIVE CV LAB;  Service: Cardiovascular;  Laterality: N/A;   TEE WITHOUT CARDIOVERSION N/A 06/21/2021   Procedure: TRANSESOPHAGEAL ECHOCARDIOGRAM (TEE);  Surgeon: Elder Negus, MD;  Location: [facility] ENDOSCOPY;  Service: Cardiovascular;  Laterality: N/A;   HPI:  76 y.o. female past medical history significant for paroxysmal atrial fibrillation on Eliquis, chronic diastolic heart failure with persistent right-sided pleural effusion, severe hypertension, MCA stroke with residual right-sided weakness, bedbound sent by family members for left-sided weakness and slurred speech of sudden onset. 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    Assessment / Plan / Recommendation  Clinical Impression  Pt alert, talkative, confused; her son was at the bedside in ED.  She demonstrated a normal oropharyngeal swallow with thorough mastication, the appearance of a brisk swallow response, and no s/s of aspiration across all consistencies tested. Her son reports no hx of  swallowing issues.  Recommend resuming a regular diet, thin liquids.  Hold tray if not alert, but otherwise she is safe for a regular diet. SLP Visit Diagnosis: Dysphagia, unspecified (R13.10)    Aspiration Risk  No limitations    Diet Recommendation   Regular solids, thin liquids  Medication Administration: Whole meds with liquid    Other  Recommendations Oral Care Recommendations: Oral care BID    Recommendations for follow up therapy are one component of a multi-disciplinary discharge planning process, led by the attending physician.  Recommendations may be updated based on patient status, additional functional criteria and insurance authorization.  Follow up Recommendations No SLP follow up        Swallow Study   General HPI: 76 y.o. female past medical history significant for paroxysmal atrial fibrillation on Eliquis, chronic diastolic heart failure with persistent right-sided pleural effusion, severe hypertension, MCA stroke with residual right-sided weakness, bedbound sent by family members for left-sided weakness and slurred speech of sudden onset. 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 Type of Study: Bedside Swallow Evaluation Previous Swallow Assessment: no Diet Prior to this Study: NPO Temperature Spikes Noted: No Respiratory Status: Nasal cannula History of Recent Intubation: No Behavior/Cognition: Alert;Confused Oral Cavity Assessment: Within Functional Limits Oral Care Completed by SLP: Recent completion by staff Oral Cavity - Dentition: Adequate natural dentition Vision: Functional for self-feeding Self-Feeding Abilities: Needs assist Patient Positioning: Upright in bed Baseline Vocal Quality: Normal Volitional Cough: Strong Volitional Swallow: Able to elicit    Oral/Motor/Sensory Function Overall Oral Motor/Sensory Function: Within functional limits   Ice Chips Ice chips: Within functional limits   Thin Liquid Thin Liquid: Within functional limits    Nectar Thick  Nectar Thick Liquid: Not tested  Honey Thick Honey Thick Liquid: Not tested   Puree Puree: Within functional limits   Solid     Solid: Within functional limits      Ashley [name] 05/15/2022,1:05 PM  Ashley Folks L. Samson Frederic, MA CCC/SLP Clinical Specialist - Acute Care SLP Acute Rehabilitation Services Office number [phone]

## 2022-05-15 NOTE — Procedures (Signed)
Patient Name: Ashley [name]  MRN: [id number]  Epilepsy Attending: Charlsie Quest  Referring Physician/Provider: Emeline General, MD  Duration:  16-May-2022 1240 to 05/15/2022 1240   Patient history: 44 F with dysarthria, leftward gaze and left-sided weakness. EEG to evaluate for seizure.   Level of alertness: lethargic    AEDs during EEG study: LEV   Technical aspects: This EEG study was done with scalp electrodes positioned according to the 10-20 International system of electrode placement. Electrical activity was reviewed with band pass filter of 1-70Hz , sensitivity of 7 uV/mm, display speed of 61mm/sec with a 60Hz  notched filter applied as appropriate. EEG data were recorded continuously and digitally stored.  Video monitoring was available and reviewed as appropriate.   Description: EEG showed continuous generalized 3-6hz  theta-delta slowing. Lateralized periodic discharges (LPD) with overriding fast activity were noted at 1hz  in right hemisphere, maximal right fronto-temporal region. LPD at times appear rhythmic without definite evolution lasting 4-8 seconds consistent with brief-ictal-interictal rhythmic discharges ( BIRDS) . Hyperventilation and photic stimulation were not performed.      ABNORMALITY - Lateralized periodic discharges with overriding fast activity, ( LPD+F), right hemisphere, maximal right fronto-temporal region -Brief-ictal-interictal rhythmic discharges ( BIRDS), right hemisphere, maximal right fronto-temporal region - Continuous slow, generalized   IMPRESSION: This study showed evidence of epileptogenicity arising from right hemisphere, maximal fronto-temporal region likely due to underlying structural abnormality as well as increased risk of seizure recurrence.   Additionally there is moderate to severe to profound diffuse encephalopathy, nonspecific etiology but likely related to seizure.    Samreet [place]field 

## 2022-05-15 NOTE — ED Notes (Signed)
MRI delayed due to EEG

## 2022-05-15 NOTE — ED Notes (Signed)
RN left NS running at 75 for potassium to not burn. RN will switch rate to 20ml/hr when potassium is done

## 2022-05-15 NOTE — Consult Note (Signed)
Consultation Note Date: 05/15/2022   Patient Name: Ashley [name]  DOB: 1946-05-16  MRN: [id number]  Age / Sex: 76 y.o., female  PCP: Merri Brunette, MD Referring Physician: David Stall, Darin Engels, MD  Reason for Consultation: Establishing goals of care  HPI/Patient Profile: 76 y.o. female  with past medical history of PAF on Eliquis, chronic HFrEF with persistent right-sided pleural effusion, severe pulmonary hypertension, MCA stroke with residual right-sided weakness and chronic bedbound, HTN, obesity admitted on 05/18/2022 with sudden onset of left-sided weakness, left-sided gaze, slurred speech.    On initial evaluation in the ED, neurology noticed patient had left-sided nystagmus as well as mentation changes, acute seizure was suspected and Ativan 2 mg IV push done. Patient then became somnolent requiring oxygen.  Seizure was confirmed by EEG showing nonconvulsive status epilepticus.  PMT has been consulted to assist with goals of care conversation.  Clinical Assessment and Goals of Care:  I have reviewed medical records including EPIC notes, labs and imaging, assessed the patient and then had a phone conversation with patient's son Iantha Fallen to discuss diagnosis prognosis, GOC, EOL wishes, disposition and options.  I introduced Palliative Medicine as specialized medical care for people living with serious illness. It focuses on providing relief from the symptoms and stress of a serious illness. The goal is to improve quality of life for both the patient and the family.  We discussed a brief life review of the patient and then focused on their current illness.   I attempted to elicit values and goals of care important to the patient.    Medical History Review and Understanding:  Reviewed patient's medical history in detail with her son.  He understands the severity of her illness.  Social History: Patient  lives at home, with her son temporarily staying at her house as well after his recent divorce.  He tells me he will be moving soon and strongly feels she will not be able to care for herself. Patient no longer has a caregiver.  Previously, she had assistance 2 hours a day 7 days a week. He is her only child.  Functional and Nutritional State: Patient's son tells me she has been bedbound since her SNF stay at [facility].  Per chart review this was in January 2023.  He feels that she has become fearful of falling after their precautions, which was then further complicated by progressive weakness.  Advance Directives: No HCPOA on file.   Code Status: Concepts specific to code status, artifical feeding and hydration, and rehospitalization were considered and discussed.    Discussion: Briefly reviewed patient's illness with her at the bedside, although it is unclear how much she understands.  She is agreeable to this PA calling her son.  He tells me his biggest concern is that returning home is not the best choice for her. He feels like she is in this constant cycle of recurrent illness because of her progressive weakness and lack of motivation to work with PT at home, often telling them she "does not feel like it.."  He also feels like nobody wants to listen to him when he shares this during hospitalizations.  She adamantly refuses to return to SNF for even short-term rehab, "exaggerating" her resources and family support in order to be discharged home.  Iantha Fallen tells me that while she does not want aggressive measures, she does ultimately want to get better and walk again.  He is very concerned she will decline further if she continues to refuse  SNF for rehab.  She has no other help at home and he will be moving out of the house soon.  He also works and is unable to provide 24/7 care.  He tells me her quality of life is "nothing" and she spends her time lying around watching TV.  He feels she was happier  when she was more mobile.  We discussed the option of hospice care if she qualifies and does not want to participate in rehab.  He does not feel like she is at that point but is open to ongoing discussions pending her clinical course.   The difference between aggressive medical intervention and comfort care was considered in light of the patient's goals of care. Hospice and Palliative Care services outpatient were explained and offered.   Discussed the importance of continued conversation with family and the medical providers regarding overall plan of care and treatment options, ensuring decisions are within the context of the patient's values and GOCs.   Questions and concerns were addressed.  Hard Choices booklet left for review. The family was encouraged to call with questions or concerns.  PMT will continue to support holistically.  SUMMARY OF RECOMMENDATIONS   -DNR confirmed -Continue current care -Patient's son feels like she would want to get better and she is not ready for hospice/end-of-life -Ongoing GOC discussions pending clinical course -Psychosocial emotional support provided -PMT will continue to follow and support  Prognosis is guarded  Discharge Planning: To Be Determined      Primary Diagnoses: Present on Admission:  Acute metabolic encephalopathy  Essential hypertension    Physical Exam Vitals and nursing note reviewed.  Constitutional:      General: She is not in acute distress.    Appearance: She is ill-appearing.     Interventions: Nasal cannula in place.  Cardiovascular:     Rate and Rhythm: Bradycardia present.  Pulmonary:     Effort: Pulmonary effort is normal. No respiratory distress.  Skin:    General: Skin is warm and dry.  Neurological:     Mental Status: She is lethargic.    Vital Signs: BP 137/67   Pulse (!) 49   Temp (!) 97.5 F (36.4 C) (Axillary)   Resp 19   Wt 93.2 kg   SpO2 99%   BMI 31.24 kg/m  Pain Scale: 0-10   Pain Score:  0-No pain   SpO2: SpO2: 99 % O2 Device:SpO2: 99 % O2 Flow Rate: .O2 Flow Rate (L/min): 2 L/min   Palliative Assessment/Data: 30%     MDM: High   Lunell Robart Jeni Salles, PA-C  Palliative Medicine Team Team phone # [phone]  Thank you for allowing the Palliative Medicine Team to assist in the care of this patient. Please utilize secure chat with additional questions, if there is no response within 30 minutes please call the above phone number.  Palliative Medicine Team providers are available by phone from 7am to 7pm daily and can be reached through the team cell phone.  Should this patient require assistance outside of these hours, please call the patient's attending physician.

## 2022-05-16 DIAGNOSIS — R569 Unspecified convulsions: Secondary | ICD-10-CM | POA: Diagnosis not present

## 2022-05-16 DIAGNOSIS — I10 Essential (primary) hypertension: Secondary | ICD-10-CM | POA: Diagnosis not present

## 2022-05-16 DIAGNOSIS — J9601 Acute respiratory failure with hypoxia: Secondary | ICD-10-CM | POA: Diagnosis not present

## 2022-05-16 DIAGNOSIS — E119 Type 2 diabetes mellitus without complications: Secondary | ICD-10-CM | POA: Diagnosis not present

## 2022-05-16 DIAGNOSIS — G9341 Metabolic encephalopathy: Secondary | ICD-10-CM | POA: Diagnosis not present

## 2022-05-16 DIAGNOSIS — L8990 Pressure ulcer of unspecified site, unspecified stage: Secondary | ICD-10-CM | POA: Insufficient documentation

## 2022-05-16 LAB — BASIC METABOLIC PANEL
Anion gap: 4 — ABNORMAL LOW (ref 5–15)
BUN: 9 mg/dL (ref 8–23)
CO2: 26 mmol/L (ref 22–32)
Calcium: 8.3 mg/dL — ABNORMAL LOW (ref 8.9–10.3)
Chloride: 107 mmol/L (ref 98–111)
Creatinine, Ser: 0.73 mg/dL (ref 0.44–1.00)
GFR, Estimated: >60 mL/min (ref >60)
Glucose, Bld: 85 mg/dL (ref 70–99)
Potassium: 3.6 mmol/L (ref 3.5–5.1)
Sodium: 137 mmol/L (ref 135–145)

## 2022-05-16 MED ORDER — LEVETIRACETAM 500 MG PO TABS
750.0000 mg | ORAL_TABLET | Freq: Two times a day (BID) | ORAL | Status: DC
  Administered 2022-05-16: 750 mg via ORAL
  Filled 2022-05-16: qty 1

## 2022-05-16 MED ORDER — LEVETIRACETAM 250 MG PO TABS
250.0000 mg | ORAL_TABLET | Freq: Once | ORAL | Status: AC
  Administered 2022-05-16: 250 mg via ORAL
  Filled 2022-05-16: qty 1

## 2022-05-16 NOTE — Consult Note (Addendum)
WOC Nurse Consult Note: Reason for Consult: Consult requested for buttocks/sacrum.  Pt has a Stage 2 pressure injury to the sacrum area, approx 3X3X.1cm, red and moist, surrounded by grey moist macerated skin; appearance is consistent with moisture associated skin damage.  Pt is incontinent of urine and a Purewick is in place to attempt to contain the urine.  ICD-10 CM Codes for Irritant Dermatitis L24A2 - Due to fecal, urinary or dual incontinence  Pressure Injury POA: Yes Dressing procedure/placement/frequency: Topical treatment orders provided for bedside nurses to perform as follows to protect from further injury and promote healing: Foam dressing to sacrum/buttock, change Q 3 days or PRN soiling. Please re-consult if further assistance is needed.  Thank-you,  Cammie Mcgee MSN, RN, CWOCN, [place], CNS [phone]

## 2022-05-16 NOTE — Procedures (Incomplete)
Patient Name: Ashley [name]  MRN: [id number]  Epilepsy Attending: Charlsie Quest  Referring Physician/Provider: Emeline General, MD  Duration:  05/15/2022 1240 to 05/15/2022 2140, 05/16/2022 0836 to 2340   Patient history: 66 F with dysarthria, leftward gaze and left-sided weakness. EEG to evaluate for seizure.   Level of alertness: lethargic    AEDs during EEG study: LEV   Technical aspects: This EEG study was done with scalp electrodes positioned according to the 10-20 International system of electrode placement. Electrical activity was reviewed with band pass filter of 1-70Hz , sensitivity of 7 uV/mm, display speed of 78mm/sec with a 60Hz  notched filter applied as appropriate. EEG data were recorded continuously and digitally stored.  Video monitoring was available and reviewed as appropriate.   Description: EEG showed continuous generalized 3-6hz  theta-delta slowing. Lateralized periodic discharges (LPD) with overriding fast activity were noted at 1hz  in right hemisphere, maximal right fronto-temporal region. LPD at times appear rhythmic without definite evolution lasting 4-8 seconds consistent with brief-ictal-interictal rhythmic discharges ( BIRDS) . Hyperventilation and photic stimulation were not performed.     Study was not recorded between 8/25/20232140 to 05/16/2022 05/15/2022   ABNORMALITY - Lateralized periodic discharges with overriding fast activity, ( LPD+F), right hemisphere, maximal right fronto-temporal region -Brief-ictal-interictal rhythmic discharges ( BIRDS), right hemisphere, maximal right fronto-temporal region - Continuous slow, generalized   IMPRESSION: This study showed evidence of epileptogenicity arising from right hemisphere, maximal fronto-temporal region likely due to underlying structural abnormality as well as increased risk of seizure recurrence. Additionally there is moderate to severe diffuse encephalopathy, nonspecific etiology but likely related to  seizure.    Ashley [name] 05/18/2022

## 2022-05-16 NOTE — Progress Notes (Signed)
Subjective: No acute events overnight.  Niece at bedside denies any new concerns.  ROS: negative except above Examination  Vital signs in last 24 hours: Temp:  [97.5 F (36.4 C)-98.3 F (36.8 C)] 97.5 F (36.4 C) (08/28 0350) Pulse Rate:  [49-90] 54 (08/28 0350) Resp:  [11-22] 18 (08/28 0350) BP: (113-156)/(66-85) 153/72 (08/28 0350) SpO2:  [95 %-100 %] 100 % (08/28 0350) Weight:  [91.7 kg] 91.7 kg (08/28 0500)  General: lying in bed, NAD Neuro: MS: Alert, oriented, follows commands CN: pupils equal and reactive,  EOMI, face symmetric, tongue midline, normal sensation over face, Motor: 4/5 strength in all 4 extremities   Basic Metabolic Panel: Recent Labs  Lab 04/21/2022 1046 04/30/2022 1050 05/11/2022 1120 05/15/22 0430 05/16/22 0335  NA 139 138 139 140 137  K 4.5 5.0 3.5 3.4* 3.6  CL 105 105  --  106 107  CO2  --  27  --  25 26  GLUCOSE 97 96  --  89 85  BUN 14 10  --  10 9  CREATININE 0.70 0.86  --  0.82 0.73  CALCIUM  --  8.7*  --  8.4* 8.3*    CBC: Recent Labs  Lab 05/19/2022 1046 05/09/2022 1050 05/17/2022 1120 05/15/22 0430  WBC  --  10.8*  --  7.7  NEUTROABS  --  5.0  --   --   HGB 13.3 10.9* 12.2 10.0*  HCT 39.0 33.0* 36.0 30.7*  MCV  --  75.7*  --  76.0*  PLT  --  314  --  211     Coagulation Studies: Recent Labs    05/12/2022 1050  LABPROT 19.7*  INR 1.7*    Imaging MRI brain ordered and pending  ASSESSMENT AND PLAN: 76 year old female with prior right MCA infarct and residual left-sided weakness who presented with nonconvulsive status epilepticus arising from right frontotemporal region which has since resolved.  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 Robb Matar via secure chat -Management of rest of comorbidities per primary team  I have spent a total of  36  minutes with the patient reviewing hospital notes,  test results, labs and examining the patient as well as establishing an assessment and plan that was discussed personally with the patient.  > 50% of time was spent in direct patient care.   Lindie Spruce Epilepsy [facility] For questions after 5pm please refer to AMION to reach the Neurologist on call

## 2022-05-16 NOTE — Progress Notes (Signed)
vLTM maintenance  All impedances below 10k.  No skin breakdown noted at all skin sites 

## 2022-05-16 NOTE — Progress Notes (Signed)
[facility] PROGRESS NOTE    Progress Note  Ashley [name]  [id number] DOB: Nov 04, 1945 DOA: 05/16/2022 PCP: Deland Pretty, MD     Brief Narrative:   Ashley [name] is an 76 y.o. female past medical history significant for paroxysmal atrial fibrillation on Eliquis, chronic diastolic heart failure with persistent right-sided pleural effusion, severe hypertension, MCA stroke with residual right-sided weakness, bedbound sent by family members for left-sided weakness and slurred speech of sudden onset.  Neurology was consulted and suspected to seizures he was given Ativan and became somnolent oxygen drop placed on oxygen sats improved.  Chest x-ray showed persistent right-sided pleural effusion and new left-sided pleural effusion.   Assessment/Plan:   Acute metabolic encephalopathy probably secondary to Seizure [facility]): Neurology was consulted and there was concern for status, given IV Ativan started on Keppra. EEG showed nonconvulsive status epilepticus Confirmed DNR/DNI. Son made it very clear he does not want any heroic measures no intubation no resuscitation. Further management per neuro, keep the patient n.p.o.  Further antiepileptic drug titration per neurology.  Acute respiratory failure with hypoxia pox and hypercarbia: Secondary to a nonconvulsive seizures plus minus Ativan. Weaned to room air. Patient appears comfortable in no acute distress.  Paroxysmal atrial fibrillation: Sinus rhythm continue Eliquis rate control.  Severe pulmonary hypertension: On sildenafil.  Essential hypertension: Continue to hold antihypertensive medication, continue IV hydralazine as needed.  Sacral decubitus ulcer stage II present on admission: RN Pressure Injury Documentation: Pressure Injury 05/15/22 Sacrum Stage 2 -  Partial thickness loss of dermis presenting as a shallow open injury with a red, pink wound bed without slough. (Active)  05/15/22 2215  Location: Sacrum   Location Orientation:   Staging: Stage 2 -  Partial thickness loss of dermis presenting as a shallow open injury with a red, pink wound bed without slough.  Wound Description (Comments):   Present on Admission: Yes  Dressing Type Foam - Lift dressing to assess site every shift 05/15/22 2215    DVT prophylaxis: lovenox Family Communication:son Status is: Inpatient Remains inpatient appropriate because: Nonconvulsive seizures.    Code Status:     Code Status Orders  (From admission, onward)           Start     Ordered   05/16/2022 1255  Do not attempt resuscitation (DNR)  Continuous       Question Answer Comment  In the event of cardiac or respiratory ARREST Do not call a "code blue"   In the event of cardiac or respiratory ARREST Do not perform Intubation, CPR, defibrillation or ACLS   In the event of cardiac or respiratory ARREST Use medication by any route, position, wound care, and other measures to relive pain and suffering. May use oxygen, suction and manual treatment of airway obstruction as needed for comfort.      05/09/2022 1254           Code Status History     Date Active Date Inactive Code Status Order ID Comments User Context   03/29/2022 0810 04/05/2022 2018 DNR [id number]  Domenic Polite, MD Inpatient   03/26/2022 1806 03/29/2022 0810 Full Code [id number]  Orma Flaming, MD ED   07/28/2021 2026 08/02/2021 2314 Full Code [id number]  Toy Baker, MD Inpatient   06/19/2021 1841 06/27/2021 1959 Full Code [id number]  Orma Flaming, MD Inpatient   04/04/2020 2238 04/06/2020 2047 Full Code [id number]  Shalhoub, Sherryll Burger, MD ED         IV Access:   Peripheral  IV   Procedures and diagnostic studies:   Overnight EEG with video  Result Date: 05/15/2022 Lora Havens, MD     05/16/2022  9:37 AM Patient Name: Ashley [name] MRN: [id number] Epilepsy Attending: Lora Havens Referring Physician/Provider: Lequita Halt, MD Duration:  05/10/2022 1240 to  05/15/2022 1240  Patient history: 70 F with dysarthria, leftward gaze and left-sided weakness. EEG to evaluate for seizure.  Level of alertness: lethargic  AEDs during EEG study: LEV  Technical aspects: This EEG study was done with scalp electrodes positioned according to the 10-20 International system of electrode placement. Electrical activity was reviewed with band pass filter of 1-70Hz , sensitivity of 7 uV/mm, display speed of 9mm/sec with a 60Hz  notched filter applied as appropriate. EEG data were recorded continuously and digitally stored.  Video monitoring was available and reviewed as appropriate.  Description: EEG showed continuous generalized 3-6hz  theta-delta slowing. Lateralized periodic discharges (LPD) with overriding fast activity were noted at 1hz  in right hemisphere, maximal right fronto-temporal region. LPD at times appear rhythmic without definite evolution lasting 4-8 seconds consistent with brief-ictal-interictal rhythmic discharges ( BIRDS) . Hyperventilation and photic stimulation were not performed.    ABNORMALITY - Lateralized periodic discharges with overriding fast activity, ( LPD+F), right hemisphere, maximal right fronto-temporal region -Brief-ictal-interictal rhythmic discharges ( BIRDS), right hemisphere, maximal right fronto-temporal region - Continuous slow, generalized  IMPRESSION: This study showed evidence of epileptogenicity arising from right hemisphere, maximal fronto-temporal region likely due to underlying structural abnormality as well as increased risk of seizure recurrence. Additionally there is moderate to severe to profound diffuse encephalopathy, nonspecific etiology but likely related to seizure.   Lora Havens    DG Chest Port 1 View  Result Date: 05/15/2022 CLINICAL DATA:  Chest pain, CHF EXAM: PORTABLE CHEST 1 VIEW COMPARISON:  Previous studies including the examination done on 04/23/2022 FINDINGS: Transverse diameter of heart is increased. Moderate to large  right pleural effusion is seen. There is improvement in aeration in left lower lung field. Central pulmonary vessels are prominent without signs of alveolar pulmonary edema. Evaluation of right mid and right lower lung fields for infiltrates is limited by the effusion. There is no pneumothorax. IMPRESSION: Moderate to large right pleural effusion with possible underlying atelectasis. Central pulmonary vessels are prominent without signs of alveolar pulmonary edema. Electronically Signed   By: Elmer Picker M.D.   On: 05/15/2022 08:46   DG Chest Port 1 View  Result Date: 05/15/2022 CLINICAL DATA:  Shortness of breath. Diminished right breast sounds. EXAM: PORTABLE CHEST 1 VIEW COMPARISON:  May 15, 2022 at 7:15 a.m. FINDINGS: Persistent moderate right pleural effusion with underlying opacity. Stable cardiomegaly. Possible pulmonary venous congestion/mild edema. The hila and mediastinum are unchanged. No pneumothorax. IMPRESSION: Stable moderate right pleural effusion with underlying opacity. Probable mild pulmonary venous congestion/edema. No other changes. Electronically Signed   By: Dorise Bullion III M.D.   On: 05/15/2022 07:55   EEG adult  Result Date: 05/04/2022 Lora Havens, MD     04/25/2022 12:56 PM Patient Name: Ashley [name] MRN: [id number] Epilepsy Attending: Lora Havens Referring Physician/Provider: Amie Portland, MD Date: 05/03/2022 Duration: 22.43 mins Patient history: 37 F with dysarthria, leftward gaze and left-sided weakness. EEG to evaluate for seizure. Level of alertness: lethargic AEDs during EEG study: Ativan Technical aspects: This EEG study was done with scalp electrodes positioned according to the 10-20 International system of electrode placement. Electrical activity was reviewed with band pass filter of 1-70Hz , sensitivity  of 7 uV/mm, display speed of 19mm/sec with a 60Hz  notched filter applied as appropriate. EEG data were recorded continuously and digitally  stored.  Video monitoring was available and reviewed as appropriate. Description: EEG showed 3-5Hz  theta-delta slowing admixed with spike and wave in right frontotemporal region with evolution in morphology and frequency lasting 8-30 seconds followed by attenuation. No clinical signs were seen. This EEG pattern is consistent with focal non-convulsive status epilepticus. EEG also showed near continuous generalized 3-6hz  theta-delta slowing. Hyperventilation and photic stimulation were not performed.   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 Dr. was notified. Wilford Corner   DG Chest Port 1 View  Result Date: 05/08/2022 CLINICAL DATA:  Code stroke.  Shortness of breath. EXAM: PORTABLE CHEST 1 VIEW COMPARISON:  March 28, 2022 FINDINGS: A moderate to large right pleural effusion with underlying opacity is stable. Opacity in left base with obscuration left hemidiaphragm is new. The cardiomediastinal silhouette is stable. No pneumothorax. No other abnormalities. IMPRESSION: 1. New opacity in the retrocardiac region obscuring the left hemidiaphragm may represent a small effusion and atelectasis. Developing infiltrate not excluded. 2. Moderate to large right pleural effusion, stable. 3. No other interval changes. Electronically Signed   By: March 30, 2022 III M.D.   On: 05/01/2022 12:37     Medical Consultants:   None.   Subjective:    [facility] no complaints  Objective:    Vitals:   05/15/22 2224 05/16/22 0023 05/16/22 0350 05/16/22 0500  BP: 128/72 113/85 (!) 153/72   Pulse: (!) 56 (!) 50 (!) 54   Resp: 14 14 18    Temp: 97.6 F (36.4 C) 97.7 F (36.5 C) (!) 97.5 F (36.4 C)    TempSrc: Oral Oral Oral   SpO2: 100% 100% 100%   Weight:    91.7 kg   SpO2: 100 % O2 Flow Rate (L/min): 2 L/min   Intake/Output Summary (Last 24 hours) at 05/16/2022 1131 Last data filed at 05/16/2022 0701 Gross per 24 hour  Intake 862.34 ml  Output --  Net 862.34 ml    Filed Weights   04/28/2022 1046 05/16/22 0500  Weight: 93.2 kg 91.7 kg    Exam: General exam: In no acute distress. Respiratory system: Good air movement and clear to auscultation. Cardiovascular system: S1 & S2 heard, RRR. No JVD. Gastrointestinal system: Abdomen is nondistended, soft and nontender.  Extremities: No pedal edema. Skin: No rashes, lesions or ulcers Psychiatry: Judgement and insight appear normal. Mood & affect appropriate.   Data Reviewed:    Labs: Basic Metabolic Panel: Recent Labs  Lab 05/09/2022 1046 05/04/2022 1050 05/04/2022 1120 05/15/22 0430 05/16/22 0335  NA 139 138 139 140 137  K 4.5 5.0 3.5 3.4* 3.6  CL 105 105  --  106 107  CO2  --  27  --  25 26  GLUCOSE 97 96  --  89 85  BUN 14 10  --  10 9  CREATININE 0.70 0.86  --  0.82 0.73  CALCIUM  --  8.7*  --  8.4* 8.3*    GFR Estimated Creatinine Clearance: 71.9 mL/min (by C-G formula based on SCr of 0.73 mg/dL). Liver Function Tests: Recent Labs  Lab 05/15/2022 1050  AST 52*  ALT 14  ALKPHOS 77  BILITOT  2.4*  PROT 6.6  ALBUMIN 2.3*    No results for input(s): "LIPASE", "AMYLASE" in the last 168 hours. No results for input(s): "AMMONIA" in the last 168 hours. Coagulation profile Recent Labs  Lab 05/19/2022 1050  INR 1.7*    COVID-19 Labs  No results for input(s): "DDIMER", "FERRITIN", "LDH", "CRP" in the last 72 hours.  Lab Results  Component Value Date   SARSCOV2NAA NEGATIVE 08/02/2021   SARSCOV2NAA NEGATIVE 07/28/2021   SARSCOV2NAA NEGATIVE 06/19/2021   SARSCOV2NAA NEGATIVE 04/04/2020    CBC: Recent Labs  Lab 04/25/2022 1046 05/02/2022 1050 05/02/2022 1120 05/15/22 0430  WBC  --  10.8*  --  7.7   NEUTROABS  --  5.0  --   --   HGB 13.3 10.9* 12.2 10.0*  HCT 39.0 33.0* 36.0 30.7*  MCV  --  75.7*  --  76.0*  PLT  --  314  --  211    Cardiac Enzymes: No results for input(s): "CKTOTAL", "CKMB", "CKMBINDEX", "TROPONINI" in the last 168 hours. BNP (last 3 results) No results for input(s): "PROBNP" in the last 8760 hours. CBG: Recent Labs  Lab 05/13/2022 1042  GLUCAP 95    D-Dimer: No results for input(s): "DDIMER" in the last 72 hours. Hgb A1c: No results for input(s): "HGBA1C" in the last 72 hours. Lipid Profile: No results for input(s): "CHOL", "HDL", "LDLCALC", "TRIG", "CHOLHDL", "LDLDIRECT" in the last 72 hours. Thyroid function studies: No results for input(s): "TSH", "T4TOTAL", "T3FREE", "THYROIDAB" in the last 72 hours.  Invalid input(s): "FREET3" Anemia work up: No results for input(s): "VITAMINB12", "FOLATE", "FERRITIN", "TIBC", "IRON", "RETICCTPCT" in the last 72 hours. Sepsis Labs: Recent Labs  Lab 05/13/2022 1050 05/15/22 0430  WBC 10.8* 7.7    Microbiology No results found for this or any previous visit (from the past 240 hour(s)).   Medications:    amiodarone  200 mg Oral Daily   apixaban  5 mg Oral BID   levothyroxine  25 mcg Oral Q0600   midodrine  5 mg Oral TID   mirtazapine  15 mg Oral QHS   mouth rinse  15 mL Mouth Rinse Q2H   polyethylene glycol  17 g Oral Daily   rosuvastatin  5 mg Oral Daily   sildenafil  20 mg Oral TID   sodium chloride flush  3 mL Intravenous Once   Continuous Infusions:  levETIRAcetam 500 mg (05/16/22 1028)      LOS: 2 days   Marinda Elk  [facility]  05/16/2022, 11:31 AM

## 2022-05-17 ENCOUNTER — Inpatient Hospital Stay (HOSPITAL_COMMUNITY): Payer: Medicare Other

## 2022-05-17 DIAGNOSIS — I272 Pulmonary hypertension, unspecified: Secondary | ICD-10-CM

## 2022-05-17 DIAGNOSIS — R569 Unspecified convulsions: Secondary | ICD-10-CM | POA: Diagnosis not present

## 2022-05-17 DIAGNOSIS — Z515 Encounter for palliative care: Secondary | ICD-10-CM

## 2022-05-17 DIAGNOSIS — J9601 Acute respiratory failure with hypoxia: Secondary | ICD-10-CM | POA: Diagnosis not present

## 2022-05-17 DIAGNOSIS — Z66 Do not resuscitate: Secondary | ICD-10-CM

## 2022-05-17 DIAGNOSIS — Z711 Person with feared health complaint in whom no diagnosis is made: Secondary | ICD-10-CM

## 2022-05-17 DIAGNOSIS — G9341 Metabolic encephalopathy: Secondary | ICD-10-CM | POA: Diagnosis not present

## 2022-05-17 DIAGNOSIS — I2510 Atherosclerotic heart disease of native coronary artery without angina pectoris: Secondary | ICD-10-CM | POA: Diagnosis not present

## 2022-05-17 DIAGNOSIS — R54 Age-related physical debility: Secondary | ICD-10-CM | POA: Diagnosis not present

## 2022-05-17 DIAGNOSIS — I4891 Unspecified atrial fibrillation: Secondary | ICD-10-CM | POA: Diagnosis not present

## 2022-05-17 DIAGNOSIS — E038 Other specified hypothyroidism: Secondary | ICD-10-CM | POA: Diagnosis not present

## 2022-05-17 DIAGNOSIS — Z789 Other specified health status: Secondary | ICD-10-CM

## 2022-05-17 DIAGNOSIS — L89159 Pressure ulcer of sacral region, unspecified stage: Secondary | ICD-10-CM

## 2022-05-17 DIAGNOSIS — I10 Essential (primary) hypertension: Secondary | ICD-10-CM | POA: Diagnosis not present

## 2022-05-17 DIAGNOSIS — R638 Other symptoms and signs concerning food and fluid intake: Secondary | ICD-10-CM

## 2022-05-17 DIAGNOSIS — Z7189 Other specified counseling: Secondary | ICD-10-CM

## 2022-05-17 DIAGNOSIS — E119 Type 2 diabetes mellitus without complications: Secondary | ICD-10-CM | POA: Diagnosis not present

## 2022-05-17 LAB — BASIC METABOLIC PANEL
Anion gap: 10 (ref 5–15)
BUN: 9 mg/dL (ref 8–23)
CO2: 25 mmol/L (ref 22–32)
Calcium: 8.7 mg/dL — ABNORMAL LOW (ref 8.9–10.3)
Chloride: 106 mmol/L (ref 98–111)
Creatinine, Ser: 0.75 mg/dL (ref 0.44–1.00)
GFR, Estimated: >60 mL/min (ref >60)
Glucose, Bld: 76 mg/dL (ref 70–99)
Potassium: 3.5 mmol/L (ref 3.5–5.1)
Sodium: 141 mmol/L (ref 135–145)

## 2022-05-17 MED ORDER — LEVETIRACETAM IN NACL 1000 MG/100ML IV SOLN
1000.0000 mg | Freq: Two times a day (BID) | INTRAVENOUS | Status: DC
  Administered 2022-05-17: 1000 mg via INTRAVENOUS
  Filled 2022-05-17 (×3): qty 100

## 2022-05-17 MED ORDER — SODIUM CHLORIDE 0.9 % IV SOLN
500.0000 mg | Freq: Once | INTRAVENOUS | Status: AC
  Administered 2022-05-17: 500 mg via INTRAVENOUS
  Filled 2022-05-17: qty 3.85

## 2022-05-17 MED ORDER — LORAZEPAM 2 MG/ML IJ SOLN: 2.0000 mg | Freq: Once | INTRAMUSCULAR | Status: DC

## 2022-05-17 MED ORDER — LEVETIRACETAM 500 MG PO TABS
1000.0000 mg | ORAL_TABLET | Freq: Two times a day (BID) | ORAL | Status: DC
  Administered 2022-05-17: 1000 mg via ORAL
  Filled 2022-05-17: qty 2

## 2022-05-17 NOTE — Progress Notes (Signed)
HOSPITAL MEDICINE OVERNIGHT EVENT NOTE    Patient refusing to take oral medications according to nursing.  Chart reviewed, patient currently suffering from acute metabolic encephalopathy thought to be secondary to seizures.  Medications reviewed.  Patient is due to receive Remeron and Keppra this evening.  We will go ahead and administer Keppra IV for now.  Marinda Elk  MD [facility]

## 2022-05-17 NOTE — Progress Notes (Signed)
vLTM maintenance  All impedances below 10k.  No skin breakdown noted at all skin sites 

## 2022-05-17 NOTE — Progress Notes (Signed)
Daily Progress Note   Patient Name: Ashley [name]       Date: 05/17/2022 DOB: 1946/08/16  Age: 75 y.o. MRN#: [id number] Attending Physician: Charlynne Cousins, MD Primary Care Physician: Deland Pretty, MD Admit Date: 04/24/2022  Reason for Consultation/Follow-up: Establishing goals of care  Subjective: Chart review performed. Received report from primary RN - no acute concerns. RN reports patient continues with poor PO intake.  Discussed case and received updates from Dr. Aileen Fass and Dr. Hortense Ramal.  Went to visit patient at bedside - no family/visitors present. Patient was lying in bed asleep - she is easily arousable; however, her eyes remain closed and she quickly falls back asleep. She is oriented to name only. Non-verbal gestures of discomfort noted. No respiratory distress, increased work of breathing, or secretions noted.   Called son/Kenneth - emotional support provided. Reviewed patient's interval history since last PMT conversation. Reviewed updates from neurologist and medical management of patient's seizures. Education provided on anticonvulsants/barbiturate  medications in context of managing seizures but with sedative effect. Per neurology, patient could likely return to previous baseline; however, her pre hospital baseline was poor. Reviewed patient's overall prognosis remains poor due to previous functional status being bedbound, sacral decub, minimal oral intake.  Natural disease trajectory and expectations at EOL were discussed. I attempted to elicit values and goals of care important to the patient. The difference between aggressive medical intervention and comfort care was considered in light of the patient's goals of care. Chrissie Noa tells me that he doesn't know what the  patient would want for her care. He is hopeful she will be able to make her own decisions. Education provided that she likely will not be able to make complex medical decisions, which is why the medical team would be looking to him.  Discussed that the goal of rehab is improvement/stabalization of functional status,  which can be a difficult goal to meet for patients with advanced illness and multiple medical conditions. Reviewed what is needed for someone to have a positive rehabilitation experience to include adequate nutritional intake as well as willingness/ability to participate. Chrissie Noa understands that patient is not eating enough to sustain herself long term and rehab would likely not be beneficial to her. Chrissie Noa tells me patient may be eating less in house because she doesn't like the food -  encouraged him/other family to bring her any foods/snacks/drinks (even from outside the hospital) she enjoys to see if she responds better. He would like to try this tonight.   Reviewed that no matter which path was chosen (comfort vs aggressive) patient is likely approaching end of life and hospice services would be reasonable. Education provided on the difference between home hospice and residential hospice.  Iantha Fallen is very clear throughout conversation that patient cannot return home safely. Therapeutic listening and emotional support provided as he reflects on how the patient has declined and he feels no one is listening or wanting to help him. Validated that patient is not able to return home safely without 24/7 supervision/assistance, which he cannot provide. Patient did have caregivers for about 2 hours per day, but this would also not be enough.   We reviewed options of discharge to rehab vs LTC vs residential hospice. Discussed insurance in context of these options to the best of my ability.   Iantha Fallen feels patient will "decide" which option by eating or not eating. He understands if she continues  with poor oral intake, her like expectancy could be 2 weeks or less and he feels hospice would be the appropriate placement.   Goal is to allow 24 hours for family to encourage patient to eat with her favorite foods. PMT to follow up tomorrow.  All questions and concerns addressed. Encouraged to call with questions and/or concerns. PMT card provided.  Length of Stay: 3  Current Medications: Scheduled Meds:   amiodarone  200 mg Oral Daily   apixaban  5 mg Oral BID   levETIRAcetam  1,000 mg Oral BID   levothyroxine  25 mcg Oral Q0600   midodrine  5 mg Oral TID   mirtazapine  15 mg Oral QHS   mouth rinse  15 mL Mouth Rinse Q2H   polyethylene glycol  17 g Oral Daily   rosuvastatin  5 mg Oral Daily   sildenafil  20 mg Oral TID   sodium chloride flush  3 mL Intravenous Once    Continuous Infusions:   PRN Meds: acetaminophen, famotidine, hydrALAZINE, metoprolol tartrate, mouth rinse  Physical Exam Vitals and nursing note reviewed.  Constitutional:      General: She is not in acute distress.    Appearance: She is ill-appearing.  Pulmonary:     Effort: No respiratory distress.  Skin:    General: Skin is warm and dry.  Neurological:     Mental Status: She is lethargic.     Motor: Weakness present.  Psychiatric:        Speech: Speech is delayed.        Cognition and Memory: Memory normal.             Vital Signs: BP (!) 138/54 (BP Location: Right Arm)   Pulse 64   Temp 98.2 F (36.8 C) (Oral)   Resp 16   Wt 91.7 kg   SpO2 93%   BMI 30.74 kg/m  SpO2: SpO2: 93 % O2 Device: O2 Device: Room Air O2 Flow Rate: O2 Flow Rate (L/min): 2 L/min  Intake/output summary: No intake or output data in the 24 hours ending 05/17/22 1302 LBM: Last BM Date : (P)  (PTA) Baseline Weight: Weight: 93.2 kg Most recent weight: Weight: 91.7 kg       Palliative Assessment/Data: PPS 10-20%      Patient Active Problem List   Diagnosis Date Noted   Pressure injury of skin 05/16/2022    Acute  respiratory failure with hypoxia ([place]) 05/15/2022   Pulmonary hypertension ([place]) 05/15/2022   Seizure ([place]) 05/01/2022   Atypical atrial flutter (HCC)    Acute on chronic systolic (congestive) heart failure ([place]) 03/26/2022   Coronary artery disease 03/26/2022   Gastroesophageal reflux disease 03/26/2022   Hypothyroidism 03/26/2022   Pleural effusion, right 03/26/2022   Pressure ulcer 03/26/2022   Long term (current) use of anticoagulants    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)    History of stroke    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   Chronic HFrEF (heart failure with reduced ejection fraction) ([place]) 07/29/2021   Anasarca 07/29/2021   Protein-calorie malnutrition, severe 07/29/2021   Non-STEMI (non-ST elevated myocardial infarction) ([place])    Sinus pause    Deep vein thrombosis (DVT) of femoral vein of right lower extremity (HCC)    Failure to thrive in adult 07/28/2021   Digitalis toxicity 07/28/2021   Pulmonary embolus (HCC)    Atrial fibrillation with RVR ([place]) 06/20/2021   Paroxysmal atrial fibrillation ([place]) 06/19/2021   AKI (acute kidney injury) ([place]) 06/19/2021   Microcytosis 06/19/2021   Bradycardia [id number]   Acute metabolic encephalopathy [id number]   Elevated troponin 04/04/2020   Essential hypertension 11/23/2018   Controlled diabetes mellitus type II without complication ([place]) 99991111   Obesity 11/23/2018    Palliative Care Assessment & Plan   Patient Profile: 76 y.o. female  with past medical history of PAF on Eliquis, chronic HFrEF with persistent right-sided pleural effusion, severe pulmonary hypertension, MCA stroke with residual right-sided weakness and chronic bedbound, HTN, obesity admitted on 04/19/2022 with sudden onset of left-sided weakness, left-sided gaze, slurred speech.    On initial evaluation in the ED, neurology noticed patient had left-sided nystagmus as well as mentation changes, acute seizure  was suspected and Ativan 2 mg IV push done. Patient then became somnolent requiring oxygen.  Seizure was confirmed by EEG showing nonconvulsive status epilepticus.  PMT has been consulted to assist with goals of care conversation.  Assessment: Principal Problem:   Seizure ([place]) Active Problems:   Essential hypertension   Controlled diabetes mellitus type II without complication (HCC)   Acute metabolic encephalopathy   Acute respiratory failure with hypoxia (HCC)   Pulmonary hypertension (HCC)   Pressure injury of skin   Concern about end of life  Recommendations/Plan: Continue current medical treatment with watchful waiting  Continue DNR/DNI as previously documented Son would like to bring patient's favorite foods to encourage her oral intake tonight prior to making decisions for hospice If patient continues with poor oral intake, son may be agreeable to residential hospice soon Ongoing GOC pending clinical course PMT will continue to follow and support holistically  Goals of Care and Additional Recommendations: Limitations on Scope of Treatment: Full Scope Treatment and No Tracheostomy  Code Status:    Code Status Orders  (From admission, onward)           Start     Ordered   05/03/2022 1255  Do not attempt resuscitation (DNR)  Continuous       Question Answer Comment  In the event of cardiac or respiratory ARREST Do not call a "code blue"   In the event of cardiac or respiratory ARREST Do not perform Intubation, CPR, defibrillation or ACLS   In the event of cardiac or respiratory ARREST Use medication by any route, position, wound care, and other measures to relive pain and suffering. May use oxygen, suction and manual treatment  of airway obstruction as needed for comfort.      05/27/22 1254           Code Status History     Date Active Date Inactive Code Status Order ID Comments User Context   03/29/2022 0810 04/05/2022 2018 DNR [id number]  Zannie Cove, MD  Inpatient   03/26/2022 1806 03/29/2022 0810 Full Code [id number]  Orland Mustard, MD ED   07/28/2021 2026 08/02/2021 2314 Full Code [id number]  Therisa Doyne, MD Inpatient   06/19/2021 1841 06/27/2021 1959 Full Code [id number]  Orland Mustard, MD Inpatient   04/04/2020 2238 04/06/2020 2047 Full Code [id number]  Shalhoub, Deno Lunger, MD ED       Prognosis:  < 2 weeks  Discharge Planning: To Be Determined  Care plan was discussed with primary RN, patient's son, Dr. David Stall, Dr. Melynda Ripple  Thank you for allowing the Palliative Medicine Team to assist in the care of this patient.   Total Time 60 minutes Prolonged Time Billed  no       Greater than 50%  of this time was spent counseling and coordinating care related to the above assessment and plan.  Haskel Khan, NP  Please contact Palliative Medicine Team phone at [phone] for questions and concerns.   *Portions of this note are a verbal dictation therefore any spelling and/or grammatical errors are due to the "[employer]" system interpretation.

## 2022-05-17 NOTE — Progress Notes (Signed)
Subjective: Continues to have frequent ictal-interictal discharges.    ROS: Unable to obtain as patient was drowsy/encephalopathic  Examination  Vital signs in last 24 hours: Temp:  [97.9 F (36.6 C)-98.2 F (36.8 C)] 98.2 F (36.8 C) (08/29 0400) Pulse Rate:  [55-64] 64 (08/29 0400) Resp:  [16] 16 (08/29 0400) BP: (127-148)/(54-71) 138/54 (08/29 0400) SpO2:  [92 %-94 %] 93 % (08/29 0400)  General: lying in bed, NAD Neuro: Barely opens eyes but does not follow commands (likely effect of moving all 4 extremities to noxious stimulation    Basic Metabolic Panel: Recent Labs  Lab 2022-05-18 1046 2022-05-18 1046 05/18/2022 1050 05-18-2022 1120 05/15/22 0430 05/16/22 0335 05/17/22 0301  NA 139  --  138 139 140 137 141  K 4.5  --  5.0 3.5 3.4* 3.6 3.5  CL 105  --  105  --  106 107 106  CO2  --   --  27  --  25 26 25   GLUCOSE 97  --  96  --  89 85 76  BUN 14  --  10  --  10 9 9   CREATININE 0.70  --  0.86  --  0.82 0.73 0.75  CALCIUM  --    < > 8.7*  --  8.4* 8.3* 8.7*   < > = values in this interval not displayed.    CBC: Recent Labs  Lab 05/18/22 1046 18-May-2022 1050 2022/05/18 1120 05/15/22 0430  WBC  --  10.8*  --  7.7  NEUTROABS  --  5.0  --   --   HGB 13.3 10.9* 12.2 10.0*  HCT 39.0 33.0* 36.0 30.7*  MCV  --  75.7*  --  76.0*  PLT  --  314  --  211     Coagulation Studies: Recent Labs    18-May-2022 1050  LABPROT 19.7*  INR 1.7*    Imaging MRI brain ordered and pending   ASSESSMENT AND PLAN: 76 year old female with prior right MCA infarct and residual left-sided weakness who presented with nonconvulsive status epilepticus arising from right frontotemporal region which has since resolved.   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 Patient is DNR and these are not focal electrographic EEG changes therefore adding medications cautiously -MRI brain without contrast ordered and pending. Will unhook LTM to get MRI today.  -Continue seizure precautions -Management of rest of comorbidities per primary team   I have spent a total of  38  minutes with the patient reviewing hospital notes,  test results, labs and examining the patient as well as establishing an assessment and plan that was discussed personally with the patient.  > 50% of time was spent in direct patient care.     05/16/22 Epilepsy [facility] For questions after 5pm please refer to AMION to reach the Neurologist on call

## 2022-05-17 NOTE — Procedures (Signed)
Patient Name: Ashley [name]  MRN: [id number]  Epilepsy Attending: Charlsie Quest  Referring Physician/Provider: Emeline General, MD  Duration:  05/16/2022 2340 to 05/17/2022 2340   Patient history: 13 F with dysarthria, leftward gaze and left-sided weakness. EEG to evaluate for seizure.   Level of alertness: awake, asleep   AEDs during EEG study: LEV   Technical aspects: This EEG study was done with scalp electrodes positioned according to the 10-20 International system of electrode placement. Electrical activity was reviewed with band pass filter of 1-70Hz , sensitivity of 7 uV/mm, display speed of 51mm/sec with a 60Hz  notched filter applied as appropriate. EEG data were recorded continuously and digitally stored.  Video monitoring was available and reviewed as appropriate.   Description: During awake state, no clear posterior dominant rhythm was seen.  Sleep was characterized by sleep spindles (12 to 40 Hz), maximal frontocentral region. EEG showed continuous generalized 3-6hz  theta-delta slowing. Lateralized periodic discharges (LPD) with overriding fast activity were noted at 1hz  in right hemisphere, maximal right fronto-temporal region. LPD at times appear rhythmic lasting 4-8 seconds consistent with brief-ictal-interictal rhythmic discharges ( BIRDS).  Gradually EEG worsened and patient went into nonconvulsive status epilepticus arising from right hemisphere, maximal right frontotemporal region.  IV phenobarbital was administered but EEG continues to show status epilepticus.  Hyperventilation and photic stimulation were not performed.       ABNORMALITY -Focal electrographic status epilepticus, right hemisphere, maximal right temporal region - Lateralized periodic discharges with overriding fast activity, ( LPD+F), right hemisphere, maximal right fronto-temporal region -Brief-ictal-interictal rhythmic discharges ( BIRDS), right hemisphere, maximal right fronto-temporal region - Continuous  slow, generalized   IMPRESSION: This study initially to be showed evidence of epileptogenicity arising from right hemisphere, maximal fronto-temporal region likely due to underlying structural abnormality as well as increased risk of seizure recurrence.  Gradually, EEG worsened and showed focal nonconvulsive/electrographic status epilepticus from right hemisphere, maximal right temporal region.  IV phenobarbital was administered but EEG continued to show status epilepticus.  Additionally there is moderate to severe diffuse encephalopathy, nonspecific etiology but likely related to seizure.    Kalani Sthilaire 

## 2022-05-17 NOTE — Progress Notes (Signed)
MB and RA Re-hooked Patient to LTM EEG by gluing electrodes back on scalp. LTM EEG hooked up and running - no initial skin breakdown - push button tested - neuro notified. [facility] monitoring.

## 2022-05-17 NOTE — Care Management Important Message (Signed)
Important Message  Patient Details  Name: Ashley [name] MRN: [id number] Date of Birth: [birth year]   Medicare Important Message Given:  Yes     Jilda Kress Stefan Church 05/17/2022, 3:33 PM

## 2022-05-17 NOTE — Progress Notes (Signed)
[facility] PROGRESS NOTE    Progress Note  Ashley [name]  [id number]:[id number] DOB: 25-Jun-1946 DOA: 05/28/2022 PCP: Merri Brunette, MD     Brief Narrative:   Ashley [name] is an 76 y.o. female past medical history significant for paroxysmal atrial fibrillation on Eliquis, chronic diastolic heart failure with persistent right-sided pleural effusion, severe hypertension, MCA stroke with residual right-sided weakness, bedbound sent by family members for left-sided weakness and slurred speech of sudden onset.  Neurology was consulted and suspected to seizures he was given Ativan and became somnolent oxygen drop placed on oxygen sats improved.  Chest x-ray showed persistent right-sided pleural effusion and new left-sided pleural effusion.   Assessment/Plan:   Acute metabolic encephalopathy probably secondary to Seizure [facility]): Neurology was consulted and there was concern for status, given IV Ativan started on Keppra. EEG showed nonconvulsive status epilepticus Confirmed DNR/DNI. Son made it very clear he does not want any heroic measures no intubation no resuscitation. Further management per neurology, they have increased her Keppra and will reevaluate LTM EEG  Acute respiratory failure with hypoxia pox and hypercarbia: Secondary to a nonconvulsive seizures plus minus Ativan. Weaned to room air. Patient appears comfortable in no acute distress.  Paroxysmal atrial fibrillation: Sinus rhythm continue Eliquis rate control.  Severe pulmonary hypertension: On sildenafil.  Essential hypertension: Continue to hold antihypertensive medication, continue IV hydralazine as needed.  Sacral decubitus ulcer stage II present on admission: RN Pressure Injury Documentation: Pressure Injury 05/15/22 Sacrum Stage 2 -  Partial thickness loss of dermis presenting as a shallow open injury with a red, pink wound bed without slough. (Active)  05/15/22 2215  Location: Sacrum  Location  Orientation:   Staging: Stage 2 -  Partial thickness loss of dermis presenting as a shallow open injury with a red, pink wound bed without slough.  Wound Description (Comments):   Present on Admission: Yes  Dressing Type Foam - Lift dressing to assess site every shift 05/16/22 1952    DVT prophylaxis: lovenox Family Communication:son Status is: Inpatient Remains inpatient appropriate because: Nonconvulsive seizures.    Code Status:     Code Status Orders  (From admission, onward)           Start     Ordered   28-May-2022 1255  Do not attempt resuscitation (DNR)  Continuous       Question Answer Comment  In the event of cardiac or respiratory ARREST Do not call a "code blue"   In the event of cardiac or respiratory ARREST Do not perform Intubation, CPR, defibrillation or ACLS   In the event of cardiac or respiratory ARREST Use medication by any route, position, wound care, and other measures to relive pain and suffering. May use oxygen, suction and manual treatment of airway obstruction as needed for comfort.      May 28, 2022 1254           Code Status History     Date Active Date Inactive Code Status Order ID Comments User Context   03/29/2022 0810 04/05/2022 2018 DNR [id number]  Zannie Cove, MD Inpatient   03/26/2022 1806 03/29/2022 0810 Full Code [id number]  Orland Mustard, MD ED   07/28/2021 2026 08/02/2021 2314 Full Code [id number]  Therisa Doyne, MD Inpatient   06/19/2021 1841 06/27/2021 1959 Full Code [id number]  Orland Mustard, MD Inpatient   04/04/2020 2238 04/06/2020 2047 Full Code [id number]  Shalhoub, Deno Lunger, MD ED         IV Access:   Peripheral IV  Procedures and diagnostic studies:   Overnight EEG with video  Result Date: 05/15/2022 Lora Havens, MD     05/16/2022  9:37 AM Patient Name: Ashley [name] MRN: [id number] Epilepsy Attending: Lora Havens Referring Physician/Provider: Lequita Halt, MD Duration:  04/26/2022 1240 to 05/15/2022  1240  Patient history: 74 F with dysarthria, leftward gaze and left-sided weakness. EEG to evaluate for seizure.  Level of alertness: lethargic  AEDs during EEG study: LEV  Technical aspects: This EEG study was done with scalp electrodes positioned according to the 10-20 International system of electrode placement. Electrical activity was reviewed with band pass filter of 1-70Hz , sensitivity of 7 uV/mm, display speed of 56mm/sec with a 60Hz  notched filter applied as appropriate. EEG data were recorded continuously and digitally stored.  Video monitoring was available and reviewed as appropriate.  Description: EEG showed continuous generalized 3-6hz  theta-delta slowing. Lateralized periodic discharges (LPD) with overriding fast activity were noted at 1hz  in right hemisphere, maximal right fronto-temporal region. LPD at times appear rhythmic without definite evolution lasting 4-8 seconds consistent with brief-ictal-interictal rhythmic discharges ( BIRDS) . Hyperventilation and photic stimulation were not performed.    ABNORMALITY - Lateralized periodic discharges with overriding fast activity, ( LPD+F), right hemisphere, maximal right fronto-temporal region -Brief-ictal-interictal rhythmic discharges ( BIRDS), right hemisphere, maximal right fronto-temporal region - Continuous slow, generalized  IMPRESSION: This study showed evidence of epileptogenicity arising from right hemisphere, maximal fronto-temporal region likely due to underlying structural abnormality as well as increased risk of seizure recurrence. Additionally there is moderate to severe to profound diffuse encephalopathy, nonspecific etiology but likely related to seizure.   [place] [facility]:   None.   Subjective:    [employer] she relates she thinks she has not not had any seizures.  Objective:    Vitals:   05/16/22 0500 05/16/22 2000 05/17/22 0000 05/17/22 0400  BP:  (!) 148/69 127/71 (!) 138/54   Pulse:  (!) 58 (!) 55 64  Resp:  16 16 16   Temp:  97.9 F (36.6 C) 98.1 F (36.7 C) 98.2 F (36.8 C)  TempSrc:  Oral Oral Oral  SpO2:  92% 94% 93%  Weight: 91.7 kg      SpO2: 93 % O2 Flow Rate (L/min): 2 L/min  No intake or output data in the 24 hours ending 05/17/22 0819  Filed Weights   05/16/2022 1046 05/16/22 0500  Weight: 93.2 kg 91.7 kg    Exam: General exam: In no acute distress. Respiratory system: Good air movement and clear to auscultation. Cardiovascular system: S1 & S2 heard, RRR. No JVD. Gastrointestinal system: Abdomen is nondistended, soft and nontender.  Extremities: No pedal edema. Skin: No rashes, lesions or ulcers Psychiatry: Judgement and insight appear normal. Mood & affect appropriate.   Data Reviewed:    Labs: Basic Metabolic Panel: Recent Labs  Lab 04/28/2022 1046 04/27/2022 1050 05/15/2022 1120 05/15/22 0430 05/16/22 0335 05/17/22 0301  NA 139 138 139 140 137 141  K 4.5 5.0 3.5 3.4* 3.6 3.5  CL 105 105  --  106 107 106  CO2  --  27  --  25 26 25   GLUCOSE 97 96  --  89 85 76  BUN 14 10  --  10 9 9   CREATININE 0.70 0.86  --  0.82 0.73 0.75  CALCIUM  --  8.7*  --  8.4* 8.3* 8.7*    GFR Estimated Creatinine Clearance: 71.9 mL/min (by  C-G formula based on SCr of 0.75 mg/dL). Liver Function Tests: Recent Labs  Lab 05/05/2022 1050  AST 52*  ALT 14  ALKPHOS 77  BILITOT 2.4*  PROT 6.6  ALBUMIN 2.3*    No results for input(s): "LIPASE", "AMYLASE" in the last 168 hours. No results for input(s): "AMMONIA" in the last 168 hours. Coagulation profile Recent Labs  Lab 04/29/2022 1050  INR 1.7*    COVID-19 Labs  No results for input(s): "DDIMER", "FERRITIN", "LDH", "CRP" in the last 72 hours.  Lab Results  Component Value Date   SARSCOV2NAA NEGATIVE 08/02/2021   SARSCOV2NAA NEGATIVE 07/28/2021   SARSCOV2NAA NEGATIVE 06/19/2021   SARSCOV2NAA NEGATIVE 04/04/2020    CBC: Recent Labs  Lab 05/02/2022 1046 05/08/2022 1050 05/10/2022 1120  05/15/22 0430  WBC  --  10.8*  --  7.7  NEUTROABS  --  5.0  --   --   HGB 13.3 10.9* 12.2 10.0*  HCT 39.0 33.0* 36.0 30.7*  MCV  --  75.7*  --  76.0*  PLT  --  314  --  211    Cardiac Enzymes: No results for input(s): "CKTOTAL", "CKMB", "CKMBINDEX", "TROPONINI" in the last 168 hours. BNP (last 3 results) No results for input(s): "PROBNP" in the last 8760 hours. CBG: Recent Labs  Lab 04/23/2022 1042  GLUCAP 95    D-Dimer: No results for input(s): "DDIMER" in the last 72 hours. Hgb A1c: No results for input(s): "HGBA1C" in the last 72 hours. Lipid Profile: No results for input(s): "CHOL", "HDL", "LDLCALC", "TRIG", "CHOLHDL", "LDLDIRECT" in the last 72 hours. Thyroid function studies: No results for input(s): "TSH", "T4TOTAL", "T3FREE", "THYROIDAB" in the last 72 hours.  Invalid input(s): "FREET3" Anemia work up: No results for input(s): "VITAMINB12", "FOLATE", "FERRITIN", "TIBC", "IRON", "RETICCTPCT" in the last 72 hours. Sepsis Labs: Recent Labs  Lab 05/08/2022 1050 05/15/22 0430  WBC 10.8* 7.7    Microbiology No results found for this or any previous visit (from the past 240 hour(s)).   Medications:    amiodarone  200 mg Oral Daily   apixaban  5 mg Oral BID   levETIRAcetam  750 mg Oral BID   levothyroxine  25 mcg Oral Q0600   midodrine  5 mg Oral TID   mirtazapine  15 mg Oral QHS   mouth rinse  15 mL Mouth Rinse Q2H   polyethylene glycol  17 g Oral Daily   rosuvastatin  5 mg Oral Daily   sildenafil  20 mg Oral TID   sodium chloride flush  3 mL Intravenous Once   Continuous Infusions:      LOS: 3 days   Marinda Elk  [facility]  05/17/2022, 8:19 AM

## 2022-05-18 DIAGNOSIS — G9341 Metabolic encephalopathy: Secondary | ICD-10-CM | POA: Diagnosis not present

## 2022-05-18 DIAGNOSIS — R569 Unspecified convulsions: Secondary | ICD-10-CM | POA: Diagnosis not present

## 2022-05-18 DIAGNOSIS — E119 Type 2 diabetes mellitus without complications: Secondary | ICD-10-CM | POA: Diagnosis not present

## 2022-05-18 DIAGNOSIS — J9601 Acute respiratory failure with hypoxia: Secondary | ICD-10-CM | POA: Diagnosis not present

## 2022-05-18 MED ORDER — LORAZEPAM 1 MG PO TABS: 1.0000 mg | ORAL_TABLET | ORAL | Status: DC | PRN

## 2022-05-18 MED ORDER — ACETAMINOPHEN 325 MG PO TABS: 650.0000 mg | ORAL_TABLET | Freq: Four times a day (QID) | ORAL | Status: DC | PRN

## 2022-05-18 MED ORDER — LORAZEPAM 2 MG/ML IJ SOLN: 1.0000 mg | INTRAMUSCULAR | Status: DC | PRN

## 2022-05-18 MED ORDER — HYDROMORPHONE HCL 1 MG/ML IJ SOLN
0.5000 mg | INTRAMUSCULAR | Status: DC | PRN
  Administered 2022-05-19 (×2): 0.5 mg via INTRAVENOUS
  Filled 2022-05-18 (×2): qty 0.5

## 2022-05-18 MED ORDER — ALBUTEROL SULFATE (2.5 MG/3ML) 0.083% IN NEBU: 2.5000 mg | INHALATION_SOLUTION | RESPIRATORY_TRACT | Status: DC | PRN

## 2022-05-18 MED ORDER — LORAZEPAM 2 MG/ML IJ SOLN
1.0000 mg | INTRAMUSCULAR | Status: DC | PRN
  Administered 2022-05-19: 1 mg via INTRAVENOUS
  Filled 2022-05-18: qty 1

## 2022-05-18 MED ORDER — SODIUM CHLORIDE 0.9% FLUSH
3.0000 mL | Freq: Two times a day (BID) | INTRAVENOUS | Status: DC
  Administered 2022-05-18 – 2022-05-19 (×2): 3 mL via INTRAVENOUS

## 2022-05-18 MED ORDER — SODIUM CHLORIDE 0.9 % IV SOLN: 250.0000 mL | INTRAVENOUS | Status: DC | PRN

## 2022-05-18 MED ORDER — OXYCODONE HCL 20 MG/ML PO CONC: 5.0000 mg | ORAL | Status: DC | PRN

## 2022-05-18 MED ORDER — LORAZEPAM 2 MG/ML PO CONC: 1.0000 mg | ORAL | Status: DC | PRN

## 2022-05-18 MED ORDER — ONDANSETRON HCL 4 MG/2ML IJ SOLN: 4.0000 mg | Freq: Four times a day (QID) | INTRAMUSCULAR | Status: DC | PRN

## 2022-05-18 MED ORDER — SODIUM CHLORIDE 0.9% FLUSH: 3.0000 mL | INTRAVENOUS | Status: DC | PRN

## 2022-05-18 MED ORDER — ATROPINE SULFATE 1 % OP SOLN
4.0000 [drp] | OPHTHALMIC | Status: DC | PRN
  Filled 2022-05-18: qty 2

## 2022-05-18 MED ORDER — ONDANSETRON 4 MG PO TBDP: 4.0000 mg | ORAL_TABLET | Freq: Four times a day (QID) | ORAL | Status: DC | PRN

## 2022-05-18 MED ORDER — SODIUM CHLORIDE 0.9 % IV SOLN
150.0000 mg | Freq: Once | INTRAVENOUS | Status: AC
  Administered 2022-05-18: 150 mg via INTRAVENOUS
  Filled 2022-05-18: qty 15

## 2022-05-18 MED ORDER — ACETAMINOPHEN 650 MG RE SUPP: 650.0000 mg | Freq: Four times a day (QID) | RECTAL | Status: DC | PRN

## 2022-05-18 MED ORDER — POLYVINYL ALCOHOL 1.4 % OP SOLN
1.0000 [drp] | Freq: Four times a day (QID) | OPHTHALMIC | Status: DC | PRN
  Filled 2022-05-18: qty 15

## 2022-05-18 MED ORDER — LEVETIRACETAM IN NACL 1500 MG/100ML IV SOLN
1500.0000 mg | Freq: Two times a day (BID) | INTRAVENOUS | Status: DC
Start: 2022-05-18 — End: 2022-05-23
  Administered 2022-05-18 – 2022-05-22 (×10): 1500 mg via INTRAVENOUS
  Filled 2022-05-18 (×10): qty 100

## 2022-05-18 MED ORDER — LORAZEPAM 2 MG/ML PO CONC
1.0000 mg | ORAL | Status: DC | PRN
  Filled 2022-05-18: qty 0.5

## 2022-05-18 NOTE — Progress Notes (Signed)
Daily Progress Note   Patient Name: Ashley [name]       Date: 05/18/2022 DOB: [birth year]  Age: 76 y.o. MRN#: [id number] Attending Physician: Maretta Bees, MD Primary Care Physician: Merri Brunette, MD Admit Date: 05/31/22  Reason for Consultation/Follow-up: Establishing goals of care  Subjective: Chart review performed.  Patient assessed at the bedside.  Ashley [name] nods her head no when asked if Ashley [name] is in pain or distress.  Ashley [name] nods her head yes in response to this PA calling her son for further conversation.  I then called Iantha Fallen for palliative support and goals of care conversation.  He confirms that after his discussion with hospitalist today, he has made decision to transition her to full comfort care.  He is at peace with this and family has been visiting who support her.  Explored his thoughts on residential hospice transfer after previous discussions with PMT.  He prefers to hold off on this for now, as there are many visitors coming in and have been informed of her current location.  He is agreeable to continue the conversation over the coming days.  All questions and concerns addressed. Encouraged to call with questions and/or concerns. PMT card provided.  Length of Stay: 4  Physical Exam Vitals and nursing note reviewed.  Constitutional:      General: Ashley [name] is not in acute distress.    Appearance: Ashley [name] is ill-appearing.  Pulmonary:     Effort: No respiratory distress.  Skin:    General: Skin is warm and dry.  Neurological:     Mental Status: Ashley [name] is lethargic.     Motor: Weakness present.            Vital Signs: BP 122/66 (BP Location: Right Arm)   Pulse 74   Temp 98.3 F (36.8 C)   Resp 17   Wt 91.7 kg   SpO2 100%   BMI 30.74 kg/m  SpO2: SpO2: 100 % O2 Device: O2  Device: Nasal Cannula O2 Flow Rate: O2 Flow Rate (L/min): 2 L/min      Palliative Assessment/Data: PPS 10-20%    Palliative Care Assessment & Plan   Patient Profile: 76 y.o. female  with past medical history of PAF on Eliquis, chronic HFrEF with persistent right-sided pleural effusion, severe pulmonary hypertension, MCA stroke with residual right-sided weakness and chronic  bedbound, HTN, obesity admitted on 15-May-2022 with sudden onset of left-sided weakness, left-sided gaze, slurred speech.    On initial evaluation in the ED, neurology noticed patient had left-sided nystagmus as well as mentation changes, acute seizure was suspected and Ativan 2 mg IV push done. Patient then became somnolent requiring oxygen.  Seizure was confirmed by EEG showing nonconvulsive status epilepticus.  PMT has been consulted to assist with goals of care conversation.  Assessment: Principal Problem:   Seizure (HCC) Active Problems:   Essential hypertension   Controlled diabetes mellitus type II without complication (HCC)   Acute metabolic encephalopathy   Acute respiratory failure with hypoxia (HCC)   Pulmonary hypertension (HCC)   Pressure injury of skin   Concern about end of life  Recommendations/Plan: Continue DNR Continue comfort care  Increased frequency of as needed Ativan to every 2 hours for anxiety/seizures/dyspnea Patient's son prefers to hold off on residential hospice referral for now, ongoing discussions PMT will continue to follow and support holistically   Prognosis:  < 2 weeks  Discharge Planning: To Be Determined  Care plan was discussed with patient, patient's son  Total time: I spent 35 minutes in the care of the patient today in the above activities and documenting the encounter.   Richardson Dopp, PA-C Palliative Medicine Team Team phone # [phone]  Thank you for allowing the Palliative Medicine Team to assist in the care of this patient. Please utilize secure  chat with additional questions, if there is no response within 30 minutes please call the above phone number.  Palliative Medicine Team providers are available by phone from 7am to 7pm daily and can be reached through the team cell phone.  Should this patient require assistance outside of these hours, please call the patient's attending physician.

## 2022-05-18 NOTE — Progress Notes (Signed)
vLTM discontinued.  [facility] notified.  No skin breakdown noted at all skin sites. ?

## 2022-05-18 NOTE — Progress Notes (Addendum)
Subjective: Continues to remain in status epilepticus even after receiving IV Vimpat overnight.  Spiked fever overnight  ROS: Unable to obtain due to poor mental status  Examination  Vital signs in last 24 hours: Temp:  [98.3 F (36.8 C)-102.5 F (39.2 C)] 98.3 F (36.8 C) (08/30 0840) Pulse Rate:  [59-63] 61 (08/30 0840) Resp:  [16] 16 (08/30 0840) BP: (94-134)/(48-72) 134/72 (08/30 0840) SpO2:  [90 %-100 %] 100 % (08/30 0840)  General: lying in bed, NAD Neuro: Keeps her eyes closed, able to nod appropriately to her name and follows simple one-step commands like squeezing hand and wiggling her toes but does not answer orientation questions, no gaze deviation, no apparent facial asymmetry, antigravity strength in all 4 extremities  Basic Metabolic Panel: Recent Labs  Lab 2022/05/20 1046 2022/05/20 1046 05-20-2022 1050 20-May-2022 1120 05/15/22 0430 05/16/22 0335 05/17/22 0301  NA 139  --  138 139 140 137 141  K 4.5  --  5.0 3.5 3.4* 3.6 3.5  CL 105  --  105  --  106 107 106  CO2  --   --  27  --  25 26 25   GLUCOSE 97  --  96  --  89 85 76  BUN 14  --  10  --  10 9 9   CREATININE 0.70  --  0.86  --  0.82 0.73 0.75  CALCIUM  --    < > 8.7*  --  8.4* 8.3* 8.7*   < > = values in this interval not displayed.    CBC: Recent Labs  Lab May 20, 2022 1046 05-20-2022 1050 2022/05/20 1120 05/15/22 0430  WBC  --  10.8*  --  7.7  NEUTROABS  --  5.0  --   --   HGB 13.3 10.9* 12.2 10.0*  HCT 39.0 33.0* 36.0 30.7*  MCV  --  75.7*  --  76.0*  PLT  --  314  --  211     Coagulation Studies: No results for input(s): "LABPROT", "INR" in the last 72 hours.  Imaging MRI brain without contrast 05/17/2022: Foci of reduced diffusion are with the chronic right parietotemporal infarct and unlikely to represent acute ischemic change. May be artifactual due to adjacent chronic blood products or possibly seizure effect. New mild parenchymal volume loss. Similar mild chronic microvascular ischemic  changes.  ASSESSMENT AND PLAN: 76 year old female with prior right MCA infarct and residual left-sided weakness who presented with nonconvulsive status epilepticus arising from right frontotemporal region.    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  This has been confirmed with son.  We can continue to slowly escalate AEDs.  However, patient has had gradual worsening in her health and even if she improves, she will likely have an extended recovery.   -Primary team and palliative team will discuss goals of care with son and likely transition to comfort care. -If patient is transition to comfort care, to continue to use Keppra 1500 mg twice daily to avoid seizure recurrence as long as is not causing significant side effects (agitation) -Can use as needed IV Ativan for clinical seizures -Continue seizure precautions -Management of rest of comorbidities per primary team  ADDENDUM -Notified by hospitalist that after  discussion with, patient is being transitioned to comfort care. -We will DC LTM EEG. -Neurology will be available if needed.   I have spent a total of 36 minutes with the patient reviewing hospital notes,  test results, labs and examining the patient as well as establishing an assessment and plan that was discussed personally with the patient.  > 50% of time was spent in direct patient care.    Lindie Spruce Epilepsy [facility] For questions after 5pm please refer to AMION to reach the Neurologist on call

## 2022-05-18 NOTE — Progress Notes (Signed)
Brief Neuro Update:  Febrile overnight with Tmax of 102.5. LTM EEG shows increasing frequency of Right LPDs. Of note, patient is DNR/DNI so cautious escalation of AEDs and she is currently on Keppra 1000mg  BID. She refused PO keppra earlier and was thus transitioned to Keppra IV. She was also given a load of phenobarb earlier today. Will get an EKG and if PR interval is normal, will do Vimpat 200mg  IV once and increase Keppra to 1500mg  BID.  [facility] Pager Number 

## 2022-05-18 NOTE — Procedures (Addendum)
Patient Name: Mesa Janus  MRN: [id number]  Epilepsy Attending: Charlsie Quest  Referring Physician/Provider: Emeline General, MD  Duration:  05/17/2022 2340 to 05/18/2022 1003   Patient history: 7 F with dysarthria, leftward gaze and left-sided weakness. EEG to evaluate for seizure.   Level of alertness: lethargic   AEDs during EEG study: LEV, Phenobarb, LCM   Technical aspects: This EEG study was done with scalp electrodes positioned according to the 10-20 International system of electrode placement. Electrical activity was reviewed with band pass filter of 1-70Hz , sensitivity of 7 uV/mm, display speed of 67mm/sec with a 60Hz  notched filter applied as appropriate. EEG data were recorded continuously and digitally stored.  Video monitoring was available and reviewed as appropriate.   Description: EEG showed 3-5Hz  theta-delta slowing admixed with spike and wave in right frontotemporal region with evolution in morphology and frequency lasting 10-20 seconds followed by attenuation and periodic discharges with overriding fast activity at 1-2 Hz.  No clinical signs were seen. This EEG pattern is consistent with focal non-convulsive status epilepticus. EEG also showed near continuous generalized 3-6hz  theta-delta slowing. Hyperventilation and photic stimulation were not performed.    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 Additionally there is severe diffuse encephalopathy, nonspecific etiology but likely related to seizure.  Alec Jaros 

## 2022-05-18 NOTE — Progress Notes (Signed)
Unresponsive to sternal rub this morning-spoke with neurologist-EEG worsening with changes consistent with status.  On 3 antiepileptics.  Spoke with son Derek Mound to transition to full comfort measures.  Will initiate full comfort care measures.  Full note to follow later.

## 2022-05-18 NOTE — Progress Notes (Signed)
PROGRESS NOTE        PATIENT DETAILS Name: Ashley [name] Age: 76 y.o. Sex: female Date of Birth: 02-Dec-1945 Admit Date: 05/08/2022 Admitting Physician Emeline General, MD IEP:[id number], Zollie Beckers, MD  Brief Summary: Patient is a 76 y.o.  female with history of PAF on Eliquis, chronic HFpEF, chronic right pleural effusion, prior CVA-who presented with acute metabolic encephalopathy due to s nonconvulsive status epilepticus.  Even with initiation of multiple antiepileptics-patient remained unresponsive and in status.  After discussion with family-she was transitioned to full comfort measures on 8/30.    Significant events: 8/26>> admit to [facility]-encephalopathy due to seizures. 8/30>> transitioned to comfort measures.  Significant studies: 8/26>> CT head: Remote right temporoparietal infarct. 8/26>> Spot EEG: Focal nonconvulsive status epilepticus 8/26-8/27>> LTM EEG: Evidence of epileptogenicity arising on the right hemisphere 8/29>> MRI brain: Foci of reduced diffusion within chronic right parietotemporal infarct. 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  Significant microbiology data:   Procedures:   Consults: Neurology Palliative care  Subjective: Unresponsive to sternal rub.  Objective: Vitals: Blood pressure 122/66, pulse 74, temperature 98.3 F (36.8 C), resp. rate 17, weight 91.7 kg, SpO2 100 %.   Exam: Gen Exam: Unresponsive to sternal rub but not in any distress. HEENT:atraumatic, normocephalic Chest: B/L clear to auscultation anteriorly CVS:S1S2 regular Abdomen:soft non tender, non distended Extremities:no edema Neurology: Difficult exam-unable to evaluate. Skin: no rash  Pertinent Labs/Radiology:    Latest Ref Rng & Units 05/15/2022    4:30 AM 04/22/2022   11:20 AM 04/25/2022   10:50 AM  CBC  WBC 4.0 - 10.5 K/uL 7.7   10.8   Hemoglobin 12.0 - 15.0 g/dL 88.4  16.6  06.3   Hematocrit 36.0 -  46.0 % 30.7  36.0  33.0   Platelets 150 - 400 K/uL 211   314     Lab Results  Component Value Date   NA 141 05/17/2022   K 3.5 05/17/2022   CL 106 05/17/2022   CO2 25 05/17/2022      Assessment/Plan: Acute metabolic encephalopathy Nonconvulsive status epilepticus Remains unresponsive-no response to multiple antiepileptics-after discussion with neurology-and then with patient's son over the phone-transitioned to full comfort measures today.  Depending on patient's clinical course-we can continue to have further discussions regarding disposition.  Acute combined hypoxic/hypercarbic respiratory failure: Thought to be due to seizures and use of benzodiazepines.  Comfort measures in effect.  PAF: No longer on Eliquis.  Severe pulmonary hypertension: No longer on sildenafil.  HTN: No longer on antihypertensives  Pressure Ulcer: Pressure Injury 05/15/22 Sacrum Stage 2 -  Partial thickness loss of dermis presenting as a shallow open injury with a red, pink wound bed without slough. (Active)  05/15/22 2215  Location: Sacrum  Location Orientation:   Staging: Stage 2 -  Partial thickness loss of dermis presenting as a shallow open injury with a red, pink wound bed without slough.  Wound Description (Comments):   Present on Admission: Yes  Dressing Type Foam - Lift dressing to assess site every shift 05/18/22 0800   Obesity: Estimated body mass index is 30.74 kg/m as calculated from the following:   Height as of 03/31/22: 5\' 8"  (1.727 m).   Weight as of this encounter: 91.7 kg.   Code status:   Code Status: DNR   DVT Prophylaxis: Son-Kenneth-[phone]-updated over the phone  Disposition Plan: Status is: Inpatient Remains inpatient appropriate because: Status epilepticus-transition to comfort measures.   Planned Discharge Destination: Inpatient death versus residential hospice-ongoing discussions with family in progress.   Diet: Diet Order             Diet regular Room  service appropriate? Yes with Assist; Fluid consistency: Thin  Diet effective now                     Antimicrobial agents: Anti-infectives (From admission, onward)    None        MEDICATIONS: Scheduled Meds:  sodium chloride flush  3 mL Intravenous Q12H   Continuous Infusions:  sodium chloride     levETIRAcetam 1,500 mg (05/18/22 0936)   PRN Meds:.sodium chloride, acetaminophen **OR** acetaminophen, albuterol, atropine, hydrALAZINE, HYDROmorphone (DILAUDID) injection, LORazepam **OR** LORazepam **OR** LORazepam, ondansetron **OR** ondansetron (ZOFRAN) IV, oxyCODONE **OR** oxyCODONE, polyvinyl alcohol, sodium chloride flush   I have personally reviewed following labs and imaging studies  LABORATORY DATA: CBC: Recent Labs  Lab 05/15/2022 1046 05/13/2022 1050 05/09/2022 1120 05/15/22 0430  WBC  --  10.8*  --  7.7  NEUTROABS  --  5.0  --   --   HGB 13.3 10.9* 12.2 10.0*  HCT 39.0 33.0* 36.0 30.7*  MCV  --  75.7*  --  76.0*  PLT  --  314  --  123456    Basic Metabolic Panel: Recent Labs  Lab 04/25/2022 1046 04/28/2022 1050 04/28/2022 1120 05/15/22 0430 05/16/22 0335 05/17/22 0301  NA 139 138 139 140 137 141  K 4.5 5.0 3.5 3.4* 3.6 3.5  CL 105 105  --  106 107 106  CO2  --  27  --  25 26 25   GLUCOSE 97 96  --  89 85 76  BUN 14 10  --  10 9 9   CREATININE 0.70 0.86  --  0.82 0.73 0.75  CALCIUM  --  8.7*  --  8.4* 8.3* 8.7*    GFR: Estimated Creatinine Clearance: 71.9 mL/min (by C-G formula based on SCr of 0.75 mg/dL).  Liver Function Tests: Recent Labs  Lab 05/08/2022 1050  AST 52*  ALT 14  ALKPHOS 77  BILITOT 2.4*  PROT 6.6  ALBUMIN 2.3*   No results for input(s): "LIPASE", "AMYLASE" in the last 168 hours. No results for input(s): "AMMONIA" in the last 168 hours.  Coagulation Profile: Recent Labs  Lab 05/19/2022 1050  INR 1.7*    Cardiac Enzymes: No results for input(s): "CKTOTAL", "CKMB", "CKMBINDEX", "TROPONINI" in the last 168 hours.  BNP (last  3 results) No results for input(s): "PROBNP" in the last 8760 hours.  Lipid Profile: No results for input(s): "CHOL", "HDL", "LDLCALC", "TRIG", "CHOLHDL", "LDLDIRECT" in the last 72 hours.  Thyroid Function Tests: No results for input(s): "TSH", "T4TOTAL", "FREET4", "T3FREE", "THYROIDAB" in the last 72 hours.  Anemia Panel: No results for input(s): "VITAMINB12", "FOLATE", "FERRITIN", "TIBC", "IRON", "RETICCTPCT" in the last 72 hours.  Urine analysis:    Component Value Date/Time   COLORURINE YELLOW 03/26/2022 1023   APPEARANCEUR CLEAR 03/26/2022 1023   LABSPEC 1.006 03/26/2022 1023   PHURINE 6.0 03/26/2022 1023   GLUCOSEU NEGATIVE 03/26/2022 1023   HGBUR SMALL (A) 03/26/2022 1023   BILIRUBINUR NEGATIVE 03/26/2022 1023   KETONESUR NEGATIVE 03/26/2022 1023   PROTEINUR NEGATIVE 03/26/2022 1023   NITRITE NEGATIVE 03/26/2022 1023   LEUKOCYTESUR LARGE (A) 03/26/2022 1023    Sepsis Labs: Lactic Acid, Venous    Component  Value Date/Time   LATICACIDVEN 2.0 (HH) 06/21/2021 1832    MICROBIOLOGY: No results found for this or any previous visit (from the past 240 hour(s)).  RADIOLOGY STUDIES/RESULTS: MR BRAIN WO CONTRAST  Result Date: 05/17/2022 CLINICAL DATA:  Seizure disorder, clinical change EXAM: MRI HEAD WITHOUT CONTRAST TECHNIQUE: Multiplanar, multiecho pulse sequences of the brain and surrounding structures were obtained without intravenous contrast. COMPARISON:  2021 FINDINGS: Motion artifact is present. Brain: Chronic right parietotemporal infarct. Associated chronic blood products. There is some reduced diffusion in this region. Additional patchy T2 hyperintensity in the supratentorial white matter is nonspecific but may reflect mild chronic microvascular ischemic changes. Prominence of the ventricles and sulci reflects new mild parenchymal volume loss. There is no intracranial mass or mass effect. No hydrocephalus or extra-axial collection. Vascular: Major vessel flow voids at  the skull base are preserved. Skull and upper cervical spine: Normal marrow signal is preserved. Sinuses/Orbits: Minor mucosal thickening.  Orbits are unremarkable. Other: Sella is partially empty. Right greater than left mastoid fluid opacification. IMPRESSION: Foci of reduced diffusion are with the chronic right parietotemporal infarct and unlikely to represent acute ischemic change. May be artifactual due to adjacent chronic blood products or possibly seizure effect. New mild parenchymal volume loss. Similar mild chronic microvascular ischemic changes. Electronically Signed   By: Guadlupe Spanish M.D.   On: 05/17/2022 12:14     LOS: 4 days   Jeoffrey Massed, MD  [facility]    To contact the attending provider between 7A-7P or the covering provider during after hours 7P-7A, please log into the web site [email or web address] and access using universal [place] password for that web site. If you do not have the password, please call the hospital operator.  05/18/2022, 1:34 PM

## 2022-05-18 NOTE — TOC Progression Note (Signed)
Transition of Care [facility]) - Progression Note    Patient Details  Name: Ashley [name] MRN: [id number] Date of Birth: 12/26/1945  [facility] CM/SW Contact  Harriet Masson, RN Phone Number: 05/18/2022, 11:51 AM  Clinical Narrative:      Patient has transitioned to comfort care. TOC will continue to follow       Expected Discharge Plan and Services                                                 Social Determinants of Health (SDOH) Interventions    Readmission Risk Interventions     No data to display

## 2022-05-19 DIAGNOSIS — R569 Unspecified convulsions: Secondary | ICD-10-CM | POA: Diagnosis not present

## 2022-05-19 NOTE — Progress Notes (Signed)
Daily Progress Note   Patient Name: Ashley [name]       Date: 05/19/2022 DOB: 07/19/1946  Age: 76 y.o. MRN#: [id number] Attending Physician: Ashley Bees, MD Primary Care Physician: Ashley Brunette, MD Admit Date: 06-05-22  Reason for Consultation/Follow-up: Establishing goals of care  Subjective: Chart review performed.  Patient assessed at the bedside.  She appears comfortable with no signs of pain or distress.  She tries to open her eyes to my voice, nods her head "no" when asked if she is uncomfortable.  No family present during my visit.  I then called patient's son Ashley [name] to provide updates and palliative support.  Shared that patient's blood pressure has dropped today and that if he is interested in transfer to residential hospice, I recommend to proceed with referral today, as she may not remain stable for long.  He tells me that he does not want to transfer patient to a hospice facility and feels she is best cared for where she is at now.  All questions and concerns addressed. Encouraged to call with questions and/or concerns. PMT card provided.  Length of Stay: 5  Physical Exam Vitals and nursing note reviewed.  Constitutional:      General: She is not in acute distress.    Appearance: She is ill-appearing.  Pulmonary:     Effort: No respiratory distress.  Skin:    General: Skin is warm and dry.  Neurological:     Mental Status: She is lethargic.     Motor: Weakness present.            Vital Signs: BP (!) 80/62 (BP Location: Right Arm)   Pulse 68   Temp 98.7 F (37.1 C) (Oral)   Resp 16   Wt 91.7 kg   SpO2 96%   BMI 30.74 kg/m  SpO2: SpO2: 96 % O2 Device: O2 Device: Room Air O2 Flow Rate: O2 Flow Rate (L/min): 2 L/min      Palliative Assessment/Data: PPS  10%    Palliative Care Assessment & Plan   Patient Profile: 76 y.o. 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    On initial evaluation in the ED, neurology noticed patient had left-sided nystagmus as well as mentation changes, acute seizure was suspected and Ativan 2 mg IV push done. Patient then became somnolent requiring oxygen.  Seizure was confirmed by EEG showing nonconvulsive status epilepticus.  PMT has been consulted to assist with goals of care conversation.  Assessment: Principal Problem:   Seizure (HCC) Active Problems:   Essential hypertension   Controlled diabetes mellitus type II without complication (HCC)   Acute metabolic encephalopathy   Acute respiratory failure with hypoxia (HCC)   Pulmonary hypertension (HCC)   Pressure injury of skin   End of life  Recommendations/Plan: Continue DNR Continue comfort care measures per [facility] Patient's son declined transfer to residential hospice PMT will continue to follow and support holistically   Prognosis:  Hours - Days  Discharge Planning: Anticipated Hospital Death  Care plan was discussed with patient, patient's son  Total time: I spent 35 minutes in the care of the patient today in the above activities and documenting the encounter.   Ashley Dopp, PA-C Palliative Medicine Team Team phone # [phone]  Thank you for allowing the Palliative Medicine Team to assist in the care of this patient. Please utilize secure chat with additional questions, if there is no response within 30 minutes please call the above phone number.  Palliative Medicine Team providers are available by phone from 7am to 7pm daily and can be reached through the  team cell phone.  Should this patient require assistance outside of these hours, please call the patient's attending physician.

## 2022-05-19 NOTE — Progress Notes (Signed)
PROGRESS NOTE        PATIENT DETAILS Name: Ashley [name] Age: 76 y.o. Sex: female Date of Birth: [birth year] Admit Date: 04/22/2022 Admitting Physician Lequita Halt, MD [id number], Thayer Jew, MD  Brief Summary: Patient is a 76 y.o.  female with history of PAF on Eliquis, chronic HFpEF, chronic right pleural effusion, prior CVA-who presented with acute metabolic encephalopathy due to s nonconvulsive status epilepticus.  Even with initiation of multiple antiepileptics-patient remained unresponsive and in status.  After discussion with family-she was transitioned to full comfort measures on 8/30.    Significant events: 8/26>> admit to [facility]-encephalopathy due to seizures. 8/30>> transitioned to comfort measures.  Significant studies: 8/26>> CT head: Remote right temporoparietal infarct. 8/26>> Spot EEG: Focal nonconvulsive status epilepticus 8/26-8/27>> LTM EEG: Evidence of epileptogenicity arising on the right hemisphere 8/29>> MRI brain: Foci of reduced diffusion within chronic right parietotemporal infarct. 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  Significant microbiology data:   Procedures:   Consults: Neurology Palliative care  Subjective: Minimally responsive today-hypotensive.  Objective: Vitals: Blood pressure (!) 80/62, pulse 68, temperature 98.7 F (37.1 C), temperature source Oral, resp. rate 16, weight 91.7 kg, SpO2 96 %.   Exam: Lethargic-opens eyes-not in any distress.  Pertinent Labs/Radiology:    Latest Ref Rng & Units 05/15/2022    4:30 AM 05/13/2022   11:20 AM 05/10/2022   10:50 AM  CBC  WBC 4.0 - 10.5 K/uL 7.7   10.8   Hemoglobin 12.0 - 15.0 g/dL 10.0  12.2  10.9   Hematocrit 36.0 - 46.0 % 30.7  36.0  33.0   Platelets 150 - 400 K/uL 211   314     Lab Results  Component Value Date   NA 141 05/17/2022   K 3.5 05/17/2022   CL 106 05/17/2022   CO2 25 05/17/2022       Assessment/Plan: Acute metabolic encephalopathy Nonconvulsive status epilepticus Continue to be in status epilepticus in spite of being on numerous AEDs-after discussion with neurology/son-transitioned to comfort measures on 8/30.  Palliative care team following-engaging with family to determine appropriate disposition.  Continue full comfort measures in the interim.  Acute combined hypoxic/hypercarbic respiratory failure: Thought to be due to seizures and use of benzodiazepines.  Comfort measures in effect.  PAF: No longer on Eliquis.  Severe pulmonary hypertension: No longer on sildenafil.  HTN: No longer on antihypertensives  Pressure Ulcer: Pressure Injury 05/15/22 Sacrum Stage 2 -  Partial thickness loss of dermis presenting as a shallow open injury with a red, pink wound bed without slough. (Active)  05/15/22 2215  Location: Sacrum  Location Orientation:   Staging: Stage 2 -  Partial thickness loss of dermis presenting as a shallow open injury with a red, pink wound bed without slough.  Wound Description (Comments):   Present on Admission: Yes  Dressing Type Foam - Lift dressing to assess site every shift 05/18/22 2000   Obesity: Estimated body mass index is 30.74 kg/m as calculated from the following:   Height as of 03/31/22: 5\' 8"  (1.727 m).   Weight as of this encounter: 91.7 kg.   Code status:   Code Status: DNR   DVT Prophylaxis: Son-Kenneth-[phone]-updated over the phone on 8/30.  Palliative care reaching out to family on 8/31.   Disposition Plan: Status is: Inpatient Remains inpatient appropriate because: Status epilepticus-transition to  comfort measures.   Planned Discharge Destination: Inpatient death versus residential hospice-ongoing discussions with family in progress.   Diet: Diet Order     None         Antimicrobial agents: Anti-infectives (From admission, onward)    None        MEDICATIONS: Scheduled Meds:   Continuous  Infusions:  levETIRAcetam 1,500 mg (05/19/22 1025)   PRN Meds:.acetaminophen **OR** acetaminophen, albuterol, atropine, hydrALAZINE, HYDROmorphone (DILAUDID) injection, LORazepam **OR** LORazepam **OR** LORazepam, ondansetron **OR** ondansetron (ZOFRAN) IV, oxyCODONE **OR** oxyCODONE, polyvinyl alcohol   I have personally reviewed following labs and imaging studies  LABORATORY DATA: CBC: Recent Labs  Lab 04/30/2022 1046 05/10/2022 1050 04/27/2022 1120 05/15/22 0430  WBC  --  10.8*  --  7.7  NEUTROABS  --  5.0  --   --   HGB 13.3 10.9* 12.2 10.0*  HCT 39.0 33.0* 36.0 30.7*  MCV  --  75.7*  --  76.0*  PLT  --  314  --  211     Basic Metabolic Panel: Recent Labs  Lab 05/08/2022 1046 05/08/2022 1050 04/25/2022 1120 05/15/22 0430 05/16/22 0335 05/17/22 0301  NA 139 138 139 140 137 141  K 4.5 5.0 3.5 3.4* 3.6 3.5  CL 105 105  --  106 107 106  CO2  --  27  --  25 26 25   GLUCOSE 97 96  --  89 85 76  BUN 14 10  --  10 9 9   CREATININE 0.70 0.86  --  0.82 0.73 0.75  CALCIUM  --  8.7*  --  8.4* 8.3* 8.7*     GFR: Estimated Creatinine Clearance: 71.9 mL/min (by C-G formula based on SCr of 0.75 mg/dL).  Liver Function Tests: Recent Labs  Lab 05/15/2022 1050  AST 52*  ALT 14  ALKPHOS 77  BILITOT 2.4*  PROT 6.6  ALBUMIN 2.3*    No results for input(s): "LIPASE", "AMYLASE" in the last 168 hours. No results for input(s): "AMMONIA" in the last 168 hours.  Coagulation Profile: Recent Labs  Lab 05/09/2022 1050  INR 1.7*     Cardiac Enzymes: No results for input(s): "CKTOTAL", "CKMB", "CKMBINDEX", "TROPONINI" in the last 168 hours.  BNP (last 3 results) No results for input(s): "PROBNP" in the last 8760 hours.  Lipid Profile: No results for input(s): "CHOL", "HDL", "LDLCALC", "TRIG", "CHOLHDL", "LDLDIRECT" in the last 72 hours.  Thyroid Function Tests: No results for input(s): "TSH", "T4TOTAL", "FREET4", "T3FREE", "THYROIDAB" in the last 72 hours.  Anemia Panel: No  results for input(s): "VITAMINB12", "FOLATE", "FERRITIN", "TIBC", "IRON", "RETICCTPCT" in the last 72 hours.  Urine analysis:    Component Value Date/Time   COLORURINE YELLOW 03/26/2022 1023   APPEARANCEUR CLEAR 03/26/2022 1023   LABSPEC 1.006 03/26/2022 1023   PHURINE 6.0 03/26/2022 1023   GLUCOSEU NEGATIVE 03/26/2022 1023   HGBUR SMALL (A) 03/26/2022 1023   BILIRUBINUR NEGATIVE 03/26/2022 1023   KETONESUR NEGATIVE 03/26/2022 1023   PROTEINUR NEGATIVE 03/26/2022 1023   NITRITE NEGATIVE 03/26/2022 1023   LEUKOCYTESUR LARGE (A) 03/26/2022 1023    Sepsis Labs: Lactic Acid, Venous    Component Value Date/Time   LATICACIDVEN 2.0 (HH) 06/21/2021 1832    MICROBIOLOGY: No results found for this or any previous visit (from the past 240 hour(s)).  RADIOLOGY STUDIES/RESULTS: No results found.   LOS: 5 days   05/27/2022, MD  [facility]    To contact the attending provider between 7A-7P or the covering provider during after  hours 7P-7A, please log into the web site [email or web address] and access using universal [place] password for that web site. If you do not have the password, please call the hospital operator.  05/19/2022, 12:00 PM

## 2022-05-20 DIAGNOSIS — R4182 Altered mental status, unspecified: Secondary | ICD-10-CM | POA: Diagnosis not present

## 2022-05-20 DIAGNOSIS — R569 Unspecified convulsions: Secondary | ICD-10-CM | POA: Diagnosis not present

## 2022-05-20 DIAGNOSIS — J9601 Acute respiratory failure with hypoxia: Secondary | ICD-10-CM | POA: Diagnosis not present

## 2022-05-20 DIAGNOSIS — G9341 Metabolic encephalopathy: Secondary | ICD-10-CM | POA: Diagnosis not present

## 2022-05-20 MED ORDER — HYDROMORPHONE HCL 1 MG/ML IJ SOLN
0.5000 mg | Freq: Four times a day (QID) | INTRAMUSCULAR | Status: DC
  Administered 2022-05-20 – 2022-05-21 (×4): 0.5 mg via INTRAVENOUS
  Filled 2022-05-20 (×4): qty 0.5

## 2022-05-20 MED ORDER — GLYCOPYRROLATE 0.2 MG/ML IJ SOLN: 0.2000 mg | INTRAMUSCULAR | Status: DC | PRN

## 2022-05-20 NOTE — Progress Notes (Signed)
PROGRESS NOTE        PATIENT DETAILS Name: Ashley [name] Age: 76 y.o. Sex: female Date of Birth: 13-Jun-1946 Admit Date: 04/29/2022 Admitting Physician Lequita Halt, MD [id number], Thayer Jew, MD  Brief Summary: Patient is a 76 y.o.  female with history of PAF on Eliquis, chronic HFpEF, chronic right pleural effusion, prior CVA-who presented with acute metabolic encephalopathy due to s nonconvulsive status epilepticus.  Even with initiation of multiple antiepileptics-patient remained unresponsive and in status.  After discussion with family-she was transitioned to full comfort measures on 8/30.    Significant events: 8/26>> admit to [facility]-encephalopathy due to seizures. 8/30>> transitioned to comfort measures.  Significant studies: 8/26>> CT head: Remote right temporoparietal infarct. 8/26>> Spot EEG: Focal nonconvulsive status epilepticus 8/26-8/27>> LTM EEG: Evidence of epileptogenicity arising on the right hemisphere 8/29>> MRI brain: Foci of reduced diffusion within chronic right parietotemporal infarct. 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  Significant microbiology data:   Procedures:   Consults: Neurology Palliative care  Subjective: Minimally responsive today-hypotensive.  Objective: Vitals: Blood pressure (!) 104/56, pulse 64, temperature 98.2 F (36.8 C), temperature source Axillary, resp. rate 16, weight 91.7 kg, SpO2 98 %.   Exam: Barely arousable-will occasionally mumble something.  Pertinent Labs/Radiology:    Latest Ref Rng & Units 05/15/2022    4:30 AM 04/23/2022   11:20 AM 05/15/2022   10:50 AM  CBC  WBC 4.0 - 10.5 K/uL 7.7   10.8   Hemoglobin 12.0 - 15.0 g/dL 10.0  12.2  10.9   Hematocrit 36.0 - 46.0 % 30.7  36.0  33.0   Platelets 150 - 400 K/uL 211   314     Lab Results  Component Value Date   NA 141 05/17/2022   K 3.5 05/17/2022   CL 106 05/17/2022   CO2 25 05/17/2022       Assessment/Plan: Acute metabolic encephalopathy Nonconvulsive status epilepticus Since she continued to be in status epilepticus in spite of being on numerous AEDs-after discussion with neurology/son-patient was transitioned to full comfort care on 8/30.  Remains encephalopathic-barely arousable-we will mumble something incoherently.  Palliative care engaging with family to determine appropriate disposition.  Suspect she is appropriate for residential hospice.   Acute combined hypoxic/hypercarbic respiratory failure: Thought to be due to seizures and use of benzodiazepines.  Comfort measures in effect.  PAF: No longer on Eliquis.  Severe pulmonary hypertension: No longer on sildenafil.  HTN: No longer on antihypertensives  Pressure Ulcer: Pressure Injury 05/15/22 Sacrum Stage 2 -  Partial thickness loss of dermis presenting as a shallow open injury with a red, pink wound bed without slough. (Active)  05/15/22 2215  Location: Sacrum  Location Orientation:   Staging: Stage 2 -  Partial thickness loss of dermis presenting as a shallow open injury with a red, pink wound bed without slough.  Wound Description (Comments):   Present on Admission: Yes  Dressing Type Foam - Lift dressing to assess site every shift 05/20/22 0800   Obesity: Estimated body mass index is 30.74 kg/m as calculated from the following:   Height as of 03/31/22: 5\' 8"  (1.727 m).   Weight as of this encounter: 91.7 kg.   Code status:   Code Status: DNR   DVT Prophylaxis: Son-Kenneth-[phone]-updated over the phone on 8/30.  Palliative care interacting with family.   Disposition Plan:  Status is: Inpatient Remains inpatient appropriate because: Status epilepticus-transition to comfort measures.   Planned Discharge Destination: Inpatient death versus residential hospice-ongoing discussions with family in progress.   Diet: Diet Order             Diet regular Room service appropriate? No; Fluid  consistency: Thin  Diet effective now                     Antimicrobial agents: Anti-infectives (From admission, onward)    None        MEDICATIONS: Scheduled Meds:   HYDROmorphone (DILAUDID) injection  0.5 mg Intravenous Q6H    Continuous Infusions:  levETIRAcetam 1,500 mg (05/20/22 1006)   PRN Meds:.acetaminophen **OR** acetaminophen, albuterol, atropine, HYDROmorphone (DILAUDID) injection, LORazepam **OR** LORazepam **OR** LORazepam, ondansetron **OR** ondansetron (ZOFRAN) IV, polyvinyl alcohol   I have personally reviewed following labs and imaging studies  LABORATORY DATA: CBC: Recent Labs  Lab 05-25-2022 1046 2022-05-25 1050 05-25-22 1120 05/15/22 0430  WBC  --  10.8*  --  7.7  NEUTROABS  --  5.0  --   --   HGB 13.3 10.9* 12.2 10.0*  HCT 39.0 33.0* 36.0 30.7*  MCV  --  75.7*  --  76.0*  PLT  --  314  --  211     Basic Metabolic Panel: Recent Labs  Lab 2022-05-25 1046 2022-05-25 1050 May 25, 2022 1120 05/15/22 0430 05/16/22 0335 05/17/22 0301  NA 139 138 139 140 137 141  K 4.5 5.0 3.5 3.4* 3.6 3.5  CL 105 105  --  106 107 106  CO2  --  27  --  25 26 25   GLUCOSE 97 96  --  89 85 76  BUN 14 10  --  10 9 9   CREATININE 0.70 0.86  --  0.82 0.73 0.75  CALCIUM  --  8.7*  --  8.4* 8.3* 8.7*     GFR: Estimated Creatinine Clearance: 71.9 mL/min (by C-G formula based on SCr of 0.75 mg/dL).  Liver Function Tests: Recent Labs  Lab 2022/05/25 1050  AST 52*  ALT 14  ALKPHOS 77  BILITOT 2.4*  PROT 6.6  ALBUMIN 2.3*    No results for input(s): "LIPASE", "AMYLASE" in the last 168 hours. No results for input(s): "AMMONIA" in the last 168 hours.  Coagulation Profile: Recent Labs  Lab May 25, 2022 1050  INR 1.7*     Cardiac Enzymes: No results for input(s): "CKTOTAL", "CKMB", "CKMBINDEX", "TROPONINI" in the last 168 hours.  BNP (last 3 results) No results for input(s): "PROBNP" in the last 8760 hours.  Lipid Profile: No results for input(s): "CHOL",  "HDL", "LDLCALC", "TRIG", "CHOLHDL", "LDLDIRECT" in the last 72 hours.  Thyroid Function Tests: No results for input(s): "TSH", "T4TOTAL", "FREET4", "T3FREE", "THYROIDAB" in the last 72 hours.  Anemia Panel: No results for input(s): "VITAMINB12", "FOLATE", "FERRITIN", "TIBC", "IRON", "RETICCTPCT" in the last 72 hours.  Urine analysis:    Component Value Date/Time   COLORURINE YELLOW 03/26/2022 1023   APPEARANCEUR CLEAR 03/26/2022 1023   LABSPEC 1.006 03/26/2022 1023   PHURINE 6.0 03/26/2022 1023   GLUCOSEU NEGATIVE 03/26/2022 1023   HGBUR SMALL (A) 03/26/2022 1023   BILIRUBINUR NEGATIVE 03/26/2022 1023   KETONESUR NEGATIVE 03/26/2022 1023   PROTEINUR NEGATIVE 03/26/2022 1023   NITRITE NEGATIVE 03/26/2022 1023   LEUKOCYTESUR LARGE (A) 03/26/2022 1023    Sepsis Labs: Lactic Acid, Venous    Component Value Date/Time   LATICACIDVEN 2.0 (HH) 06/21/2021 1832    MICROBIOLOGY:  No results found for this or any previous visit (from the past 240 hour(s)).  RADIOLOGY STUDIES/RESULTS: No results found.   LOS: 6 days   Jeoffrey Massed, MD  [facility]    To contact the attending provider between 7A-7P or the covering provider during after hours 7P-7A, please log into the web site [email or web address] and access using universal [place] password for that web site. If you do not have the password, please call the hospital operator.  05/20/2022, 11:43 AM

## 2022-05-20 NOTE — Progress Notes (Addendum)
Daily Progress Note   Patient Name: Ashley [name]       Date: 05/20/2022 DOB: 1946-04-19  Age: 76 y.o. MRN#: [id number] Attending Physician: Maretta Bees, MD Primary Care Physician: Merri Brunette, MD Admit Date: 05/03/2022  Reason for Consultation/Follow-up: Non pain symptom management, Pain control, Psychosocial/spiritual support, and Terminal Care  Subjective: Chart review performed. Received report from primary RN - no acute concerns. Reports patient is mostly unresponsive.  Went to visit patient at bedside - no family/visitors present. Patient was lying in bed asleep - upon voice/gentle touch she does softly nod her head, eyes remained closed, nonverbal. She is continually moaning. RR 24. No respiratory distress, increased work of breathing, or secretions noted. Freed patient from cardiac monitoring and continuous pulse ox.   Discussed symptom management plan with RN.   12:20 PM Called and spoke with son/Kenneth - emotional support provided. Updates provided from my assessment this morning as outlined above. Reviewed that patient is stable for transfer to residential hospice, do not anticipate imminent death. Son states he is still not interested in transfer at this time.  All questions and concerns addressed. Encouraged to call with questions and/or concerns. PMT number previously provided.  Length of Stay: 6  Current Medications: Scheduled Meds:    Continuous Infusions:  levETIRAcetam 1,500 mg (05/20/22 1006)    PRN Meds: acetaminophen **OR** acetaminophen, albuterol, atropine, hydrALAZINE, HYDROmorphone (DILAUDID) injection, LORazepam **OR** LORazepam **OR** LORazepam, ondansetron **OR** ondansetron (ZOFRAN) IV, oxyCODONE **OR** oxyCODONE, polyvinyl alcohol  Physical  Exam Vitals and nursing note reviewed.  Constitutional:      General: She is not in acute distress.    Appearance: She is ill-appearing.  Pulmonary:     Effort: No respiratory distress.  Skin:    General: Skin is warm and dry.  Neurological:     Mental Status: She is lethargic.     Motor: Weakness present.  Psychiatric:        Speech: She is noncommunicative.             Vital Signs: BP (!) 104/56 (BP Location: Right Arm)   Pulse 64   Temp 98.2 F (36.8 C) (Axillary)   Resp 16   Wt 91.7 kg   SpO2 98%   BMI 30.74 kg/m  SpO2: SpO2: 98 % O2 Device: O2 Device: Room ONEOK  O2 Flow Rate: O2 Flow Rate (L/min): 2 L/min  Intake/output summary:  Intake/Output Summary (Last 24 hours) at 05/20/2022 1104 Last data filed at 05/19/2022 2200 Gross per 24 hour  Intake 100 ml  Output 200 ml  Net -100 ml   LBM: Last BM Date :  (uta) Baseline Weight: Weight: 93.2 kg Most recent weight: Weight: 91.7 kg       Palliative Assessment/Data: PPS 10%      Patient Active Problem List   Diagnosis Date Noted   Pressure injury of skin 05/16/2022   Acute respiratory failure with hypoxia (HCC) 05/15/2022   Pulmonary hypertension (HCC) 05/15/2022   Seizure (HCC) 05/19/2022   Atypical atrial flutter (HCC)    Acute on chronic systolic (congestive) heart failure (HCC) 03/26/2022   Coronary artery disease 03/26/2022   Gastroesophageal reflux disease 03/26/2022   Hypothyroidism 03/26/2022   Pleural effusion, right 03/26/2022   Pressure ulcer 03/26/2022   Long term (current) use of anticoagulants    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)    History of stroke    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   Chronic HFrEF (heart failure with reduced ejection fraction) (HCC) 07/29/2021   Anasarca 07/29/2021   Protein-calorie malnutrition, severe 07/29/2021   Non-STEMI (non-ST elevated myocardial infarction) (HCC)    Sinus pause    Deep vein thrombosis (DVT) of femoral  vein of right lower extremity (HCC)    Failure to thrive in adult 07/28/2021   Digitalis toxicity 07/28/2021   Pulmonary embolus (HCC)    Atrial fibrillation with RVR (HCC) 06/20/2021   Paroxysmal atrial fibrillation (HCC) 06/19/2021   AKI (acute kidney injury) (HCC) 06/19/2021   Microcytosis 06/19/2021   Bradycardia 04/04/2020   Acute metabolic encephalopathy 04/04/2020   Elevated troponin 04/04/2020   Essential hypertension 11/23/2018   Controlled diabetes mellitus type II without complication (HCC) 11/23/2018   Obesity 11/23/2018    Palliative Care Assessment & Plan   Patient Profile: 76 y.o. female  with past medical history of PAF on Eliquis, chronic HFrEF with persistent right-sided pleural effusion, severe pulmonary hypertension, MCA stroke with residual right-sided weakness and chronic bedbound, HTN, obesity admitted on 05/15/2022 with sudden onset of left-sided weakness, left-sided gaze, slurred speech.    On initial evaluation in the ED, neurology noticed patient had left-sided nystagmus as well as mentation changes, acute seizure was suspected and Ativan 2 mg IV push done. Patient then became somnolent requiring oxygen.  Seizure was confirmed by EEG showing nonconvulsive status epilepticus.  PMT has been consulted to assist with goals of care conversation.  Assessment: Principal Problem:   Seizure (HCC) Active Problems:   Essential hypertension   Controlled diabetes mellitus type II without complication (HCC)   Acute metabolic encephalopathy   Acute respiratory failure with hypoxia (HCC)   Pulmonary hypertension (HCC)   Pressure injury of skin   Terminal care  Recommendations/Plan: Continue full comfort measures Continue DNR/DNI as previously documented Continue current comfort focused medication regimen Added scheduled dilaudid Patient is stable for transfer to residential hospice; however, son has requested patient remain in house for EOL care - anticipate hospital  death Unrestricted visitation orders were placed per current [place] COVID19 EOL visitation policy  PMT will continue to follow and support holistically  Symptom Management Scheduled dilaudid q6h; PRN doses for breakthrough symptoms Continue keppra for seizure prophylaxis  Tylenol PRN pain/fever Biotin twice daily Benadryl PRN itching Atropine PRN secretions Ativan PRN anxiety/seizure/sleep/distress Zofran PRN nausea/vomiting Liquifilm Tears  PRN dry eye Discontinue PRN hydralazine and PO oxycodone  Goals of Care and Additional Recommendations: Limitations on Scope of Treatment: Full Comfort Care  Code Status:    Code Status Orders  (From admission, onward)           Start     Ordered   05/18/22 0956  Do not attempt resuscitation (DNR)  Continuous       Question Answer Comment  In the event of cardiac or respiratory ARREST Do not call a "code blue"   In the event of cardiac or respiratory ARREST Do not perform Intubation, CPR, defibrillation or ACLS   In the event of cardiac or respiratory ARREST Use medication by any route, position, wound care, and other measures to relive pain and suffering. May use oxygen, suction and manual treatment of airway obstruction as needed for comfort.      05/18/22 0957           Code Status History     Date Active Date Inactive Code Status Order ID Comments User Context   05/04/2022 1254 05/18/2022 0957 DNR [id number]  Emeline General, MD ED   03/29/2022 0810 04/05/2022 2018 DNR [id number]  Zannie Cove, MD Inpatient   03/26/2022 1806 03/29/2022 0810 Full Code [id number]  Orland Mustard, MD ED   07/28/2021 2026 08/02/2021 2314 Full Code [id number]  Therisa Doyne, MD Inpatient   06/19/2021 1841 06/27/2021 1959 Full Code [id number]  Orland Mustard, MD Inpatient   04/04/2020 2238 04/06/2020 2047 Full Code [id number]  Shalhoub, Deno Lunger, MD ED       Prognosis:  days  Discharge Planning: Anticipated Hospital Death  Care plan was  discussed with primary RN  Thank you for allowing the Palliative Medicine Team to assist in the care of this patient.   Haskel Khan, NP  Please contact Palliative Medicine Team phone at [phone] for questions and concerns.   *Portions of this note are a verbal dictation therefore any spelling and/or grammatical errors are due to the "[employer]agon [employer] One" system interpretation.

## 2022-05-20 DEATH — deceased

## 2022-05-21 DIAGNOSIS — J9601 Acute respiratory failure with hypoxia: Secondary | ICD-10-CM | POA: Diagnosis not present

## 2022-05-21 DIAGNOSIS — R52 Pain, unspecified: Secondary | ICD-10-CM

## 2022-05-21 DIAGNOSIS — G9341 Metabolic encephalopathy: Secondary | ICD-10-CM | POA: Diagnosis not present

## 2022-05-21 DIAGNOSIS — R569 Unspecified convulsions: Secondary | ICD-10-CM | POA: Diagnosis not present

## 2022-05-21 DIAGNOSIS — R4182 Altered mental status, unspecified: Secondary | ICD-10-CM | POA: Diagnosis not present

## 2022-05-21 MED ORDER — SODIUM CHLORIDE 0.9 % IV SOLN
0.5000 mg/h | INTRAVENOUS | Status: DC
  Administered 2022-05-21: 0.5 mg/h via INTRAVENOUS
  Filled 2022-05-21: qty 2.5

## 2022-05-21 MED ORDER — HYDROMORPHONE BOLUS VIA INFUSION
0.5000 mg | INTRAVENOUS | Status: DC | PRN
  Administered 2022-05-22: 0.5 mg via INTRAVENOUS

## 2022-05-21 NOTE — Progress Notes (Addendum)
Daily Progress Note   Patient Name: Ashley [name]       Date: 05/21/2022 DOB: [birth year]  Age: 76 y.o. MRN#: [id number] Attending Physician: Maretta Bees, MD Primary Care Physician: Merri Brunette, MD Admit Date: 2022/05/18  Reason for Consultation/Follow-up: Non pain symptom management, Pain control, Psychosocial/spiritual support, and Terminal Care  Subjective: Chart review performed. Received report from primary RN - no acute concerns.   Went to visit patient at bedside - no family/visitors present. Patient was lying in bed - she does not wake to voice/gentle touch. Signs of pain/discomfort improved from yesterday; however, patient still with occasional moaning. No respiratory distress, increased work of breathing, or secretions noted. 13 second apneic episode noted.  Spoke with primary RN and charge RN about concern of ants in patient's room. Requested bath, linen change, bed change, room change - RN staff will complete.   Reviewed symptom management plan with primary RN.  Discussed case with Dr. Jerral Ralph.  Length of Stay: 7  Current Medications: Scheduled Meds:    HYDROmorphone (DILAUDID) injection  0.5 mg Intravenous Q6H    Continuous Infusions:  levETIRAcetam Stopped (05/20/22 2200)    PRN Meds: acetaminophen **OR** acetaminophen, albuterol, atropine, HYDROmorphone (DILAUDID) injection, LORazepam **OR** LORazepam **OR** LORazepam, ondansetron **OR** ondansetron (ZOFRAN) IV, polyvinyl alcohol  Physical Exam Vitals and nursing note reviewed.  Constitutional:      General: She is not in acute distress.    Appearance: She is ill-appearing.  Pulmonary:     Effort: No respiratory distress.  Skin:    General: Skin is warm and dry.  Neurological:     Mental Status:  She is unresponsive.     Motor: Weakness present.  Psychiatric:        Speech: She is noncommunicative.             Vital Signs: BP 113/60 (BP Location: Right Arm)   Pulse (!) 49   Temp 98.2 F (36.8 C) (Oral)   Resp 14   Wt 91.7 kg   SpO2 91%   BMI 30.74 kg/m  SpO2: SpO2: 91 % O2 Device: O2 Device: Room Air O2 Flow Rate: O2 Flow Rate (L/min): 2 L/min  Intake/output summary: No intake or output data in the 24 hours ending 05/21/22 0827 LBM: Last BM Date :  (uta) Baseline Weight: Weight: 93.2  kg Most recent weight: Weight: 91.7 kg       Palliative Assessment/Data: PPS 10%      Patient Active Problem List   Diagnosis Date Noted   Pressure injury of skin 05/16/2022   Acute respiratory failure with hypoxia (HCC) 05/15/2022   Pulmonary hypertension (HCC) 05/15/2022   Seizure (HCC) 04/26/2022   Atypical atrial flutter (HCC)    Acute on chronic systolic (congestive) heart failure (HCC) 03/26/2022   Coronary artery disease 03/26/2022   Gastroesophageal reflux disease 03/26/2022   Hypothyroidism 03/26/2022   Pleural effusion, right 03/26/2022   Pressure ulcer 03/26/2022   Long term (current) use of anticoagulants    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)    History of stroke    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   Chronic HFrEF (heart failure with reduced ejection fraction) (HCC) 07/29/2021   Anasarca 07/29/2021   Protein-calorie malnutrition, severe 07/29/2021   Non-STEMI (non-ST elevated myocardial infarction) (HCC)    Sinus pause    Deep vein thrombosis (DVT) of femoral vein of right lower extremity (HCC)    Failure to thrive in adult 07/28/2021   Digitalis toxicity 07/28/2021   Pulmonary embolus (HCC)    Atrial fibrillation with RVR (HCC) 06/20/2021   Paroxysmal atrial fibrillation (HCC) 06/19/2021   AKI (acute kidney injury) (HCC) 06/19/2021   Microcytosis 06/19/2021   Bradycardia 04/04/2020   Acute metabolic  encephalopathy 04/04/2020   Elevated troponin 04/04/2020   Essential hypertension 11/23/2018   Controlled diabetes mellitus type II without complication (HCC) 11/23/2018   Obesity 11/23/2018    Palliative Care Assessment & Plan   Patient Profile: 76 y.o. female  with past medical history of PAF on Eliquis, chronic HFrEF with persistent right-sided pleural effusion, severe pulmonary hypertension, MCA stroke with residual right-sided weakness and chronic bedbound, HTN, obesity admitted on 05/11/2022 with sudden onset of left-sided weakness, left-sided gaze, slurred speech.    On initial evaluation in the ED, neurology noticed patient had left-sided nystagmus as well as mentation changes, acute seizure was suspected and Ativan 2 mg IV push done. Patient then became somnolent requiring oxygen.  Seizure was confirmed by EEG showing nonconvulsive status epilepticus.  PMT has been consulted to assist with goals of care conversation.  Assessment: Principal Problem:   Seizure (HCC) Active Problems:   Essential hypertension   Controlled diabetes mellitus type II without complication (HCC)   Acute metabolic encephalopathy   Acute respiratory failure with hypoxia (HCC)   Pulmonary hypertension (HCC)   Pressure injury of skin   Terminal care  Recommendations/Plan: Continue full comfort measures Continue DNR/DNI as previously documented Initiated dilaudid drip with bolus doses as needed Not stable for transfer to residential hospice today - anticipate hospital death PMT will continue to follow and support holistically  Symptom Management Continuous dilaudid infusion; PRN doses for breakthrough symptoms Continue keppra for seizure prophylaxis  Tylenol PRN pain/fever Atropine PRN secretions Ativan PRN anxiety/seizure/sleep/distress Zofran PRN nausea/vomiting Liquifilm Tears PRN dry eye   Goals of Care and Additional Recommendations: Limitations on Scope of Treatment: Full Comfort  Care  Code Status:    Code Status Orders  (From admission, onward)           Start     Ordered   05/18/22 0956  Do not attempt resuscitation (DNR)  Continuous       Question Answer Comment  In the event of cardiac or respiratory ARREST Do not call a "code blue"   In the  event of cardiac or respiratory ARREST Do not perform Intubation, CPR, defibrillation or ACLS   In the event of cardiac or respiratory ARREST Use medication by any route, position, wound care, and other measures to relive pain and suffering. May use oxygen, suction and manual treatment of airway obstruction as needed for comfort.      05/18/22 0957           Code Status History     Date Active Date Inactive Code Status Order ID Comments User Context   05/12/2022 1254 05/18/2022 0957 DNR [id number]  Emeline General, MD ED   03/29/2022 0810 04/05/2022 2018 DNR [id number]  Zannie Cove, MD Inpatient   03/26/2022 1806 03/29/2022 0810 Full Code [id number]  Orland Mustard, MD ED   07/28/2021 2026 08/02/2021 2314 Full Code [id number]  Therisa Doyne, MD Inpatient   06/19/2021 1841 06/27/2021 1959 Full Code [id number]  Orland Mustard, MD Inpatient   04/04/2020 2238 04/06/2020 2047 Full Code [id number]  Shalhoub, Deno Lunger, MD ED       Prognosis:  Hours - Days  Discharge Planning: Anticipated Hospital Death  Care plan was discussed with primary RN, Dr. Jerral Ralph, charge RN  Thank you for allowing the Palliative Medicine Team to assist in the care of this patient.   Haskel Khan, NP  Please contact Palliative Medicine Team phone at [phone] for questions and concerns.   *Portions of this note are a verbal dictation therefore any spelling and/or grammatical errors are due to the "[employer]agon [employer] One" system interpretation.

## 2022-05-21 NOTE — Progress Notes (Signed)
PROGRESS NOTE        PATIENT DETAILS Name: Ashley [name] Age: 76 y.o. Sex: female Date of Birth: 07/18/1946 Admit Date: 06-01-2022 Admitting Physician Emeline General, MD FUX:NATFT, Zollie Beckers, MD  Brief Summary: Patient is a 76 y.o.  female with history of PAF on Eliquis, chronic HFpEF, chronic right pleural effusion, prior CVA-who presented with acute metabolic encephalopathy due to s nonconvulsive status epilepticus.  Even with initiation of multiple antiepileptics-patient remained unresponsive and in status.  After discussion with family-she was transitioned to full comfort measures on 8/30.    Significant events: 8/26>> admit to [facility]-encephalopathy due to seizures. 8/30>> transitioned to comfort measures.  Significant studies: 8/26>> CT head: Remote right temporoparietal infarct. 8/26>> Spot EEG: Focal nonconvulsive status epilepticus 8/26-8/27>> LTM EEG: Evidence of epileptogenicity arising on the right hemisphere 8/29>> MRI brain: Foci of reduced diffusion within chronic right parietotemporal infarct. 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  Significant microbiology data:   Procedures:   Consults: Neurology Palliative care  Subjective: Unresponsive-having apneic spells.  Objective: Vitals: Blood pressure 129/69, pulse (!) 49, temperature 98.2 F (36.8 C), temperature source Axillary, resp. rate 14, weight 91.7 kg, SpO2 91 %.   Exam: Unresponsive-having apneic spells.  Pertinent Labs/Radiology:    Latest Ref Rng & Units 05/15/2022    4:30 AM June 01, 2022   11:20 AM 06/01/2022   10:50 AM  CBC  WBC 4.0 - 10.5 K/uL 7.7   10.8   Hemoglobin 12.0 - 15.0 g/dL 73.2  20.2  54.2   Hematocrit 36.0 - 46.0 % 30.7  36.0  33.0   Platelets 150 - 400 K/uL 211   314     Lab Results  Component Value Date   NA 141 05/17/2022   K 3.5 05/17/2022   CL 106 05/17/2022   CO2 25 05/17/2022       Assessment/Plan: Acute metabolic encephalopathy Nonconvulsive status epilepticus Since she continued to be in status epilepticus in spite of being on numerous AEDs-after discussion with neurology/son-patient was transitioned to full comfort care on 8/30.  Remains severely encephalopathic-barely arousable today-having apneic spells.  Suspect very close to dying-continue supportive care-expect inpatient death at this point-as she is not stable for transfer to residential hospice.  Acute combined hypoxic/hypercarbic respiratory failure: Thought to be due to seizures and use of benzodiazepines.  Comfort measures in effect.  PAF: No longer on Eliquis.  Severe pulmonary hypertension: No longer on sildenafil.  HTN: No longer on antihypertensives  Pressure Ulcer: Pressure Injury 05/15/22 Sacrum Stage 2 -  Partial thickness loss of dermis presenting as a shallow open injury with a red, pink wound bed without slough. (Active)  05/15/22 2215  Location: Sacrum  Location Orientation:   Staging: Stage 2 -  Partial thickness loss of dermis presenting as a shallow open injury with a red, pink wound bed without slough.  Wound Description (Comments):   Present on Admission: Yes  Dressing Type Foam - Lift dressing to assess site every shift 05/20/22 2000   Obesity: Estimated body mass index is 30.74 kg/m as calculated from the following:   Height as of 03/31/22: 5\' 8"  (1.727 m).   Weight as of this encounter: 91.7 kg.   Code status:   Code Status: DNR   DVT Prophylaxis: Son-[phone]-updated over the phone on 9/02.   Disposition Plan: Status is: Inpatient Remains inpatient  appropriate because: Status epilepticus-transition to comfort measures.   Planned Discharge Destination: Inpatient death    Diet: Diet Order             Diet regular Room service appropriate? No; Fluid consistency: Thin  Diet effective now                     Antimicrobial  agents: Anti-infectives (From admission, onward)    None        MEDICATIONS: Scheduled Meds:    Continuous Infusions:  HYDROmorphone 0.5 mg/hr (05/21/22 1051)   levETIRAcetam 1,500 mg (05/21/22 1027)   PRN Meds:.acetaminophen **OR** acetaminophen, albuterol, atropine, HYDROmorphone, LORazepam **OR** LORazepam **OR** LORazepam, ondansetron **OR** ondansetron (ZOFRAN) IV, polyvinyl alcohol   I have personally reviewed following labs and imaging studies  LABORATORY DATA: CBC: Recent Labs  Lab 05/15/22 0430  WBC 7.7  HGB 10.0*  HCT 30.7*  MCV 76.0*  PLT 211     Basic Metabolic Panel: Recent Labs  Lab 05/15/22 0430 05/16/22 0335 05/17/22 0301  NA 140 137 141  K 3.4* 3.6 3.5  CL 106 107 106  CO2 25 26 25   GLUCOSE 89 85 76  BUN 10 9 9   CREATININE 0.82 0.73 0.75  CALCIUM 8.4* 8.3* 8.7*     GFR: Estimated Creatinine Clearance: 71.9 mL/min (by C-G formula based on SCr of 0.75 mg/dL).  Liver Function Tests: No results for input(s): "AST", "ALT", "ALKPHOS", "BILITOT", "PROT", "ALBUMIN" in the last 168 hours.  No results for input(s): "LIPASE", "AMYLASE" in the last 168 hours. No results for input(s): "AMMONIA" in the last 168 hours.  Coagulation Profile: No results for input(s): "INR", "PROTIME" in the last 168 hours.   Cardiac Enzymes: No results for input(s): "CKTOTAL", "CKMB", "CKMBINDEX", "TROPONINI" in the last 168 hours.  BNP (last 3 results) No results for input(s): "PROBNP" in the last 8760 hours.  Lipid Profile: No results for input(s): "CHOL", "HDL", "LDLCALC", "TRIG", "CHOLHDL", "LDLDIRECT" in the last 72 hours.  Thyroid Function Tests: No results for input(s): "TSH", "T4TOTAL", "FREET4", "T3FREE", "THYROIDAB" in the last 72 hours.  Anemia Panel: No results for input(s): "VITAMINB12", "FOLATE", "FERRITIN", "TIBC", "IRON", "RETICCTPCT" in the last 72 hours.  Urine analysis:    Component Value Date/Time   COLORURINE YELLOW 03/26/2022  1023   APPEARANCEUR CLEAR 03/26/2022 1023   LABSPEC 1.006 03/26/2022 1023   PHURINE 6.0 03/26/2022 1023   GLUCOSEU NEGATIVE 03/26/2022 1023   HGBUR SMALL (A) 03/26/2022 1023   BILIRUBINUR NEGATIVE 03/26/2022 1023   KETONESUR NEGATIVE 03/26/2022 1023   PROTEINUR NEGATIVE 03/26/2022 1023   NITRITE NEGATIVE 03/26/2022 1023   LEUKOCYTESUR LARGE (A) 03/26/2022 1023    Sepsis Labs: Lactic Acid, Venous    Component Value Date/Time   LATICACIDVEN 2.0 (HH) 06/21/2021 1832    MICROBIOLOGY: No results found for this or any previous visit (from the past 240 hour(s)).  RADIOLOGY STUDIES/RESULTS: No results found.   LOS: 7 days   05/27/2022, MD  [facility]    To contact the attending provider between 7A-7P or the covering provider during after hours 7P-7A, please log into the web site [email or web address] and access using universal [place] password for that web site. If you do not have the password, please call the hospital operator.  05/21/2022, 11:23 AM

## 2022-05-21 NOTE — Progress Notes (Signed)
Katrinka Blazing, NP and RN went into pt bedside and observed ants on pt. RN and tech cleansed pt, switched pt to a new bed with fresh linens, and changed pt to a new room. Katrinka Blazing, NP and Denny Peon, Charge RN aware of situation.

## 2022-05-22 DIAGNOSIS — R569 Unspecified convulsions: Secondary | ICD-10-CM | POA: Diagnosis not present

## 2022-05-23 NOTE — Progress Notes (Signed)
2350 05/24/2022 patient found unresponsive, 0 respirations up auscultation, 0 pulse upon palpation, 0 heart 0rate upon auscultation with 2nd RN Tera Mater. 2 nurse verification   2359 05/29/2022 MD John Giovanni paged and messaged via secure chat with above findings. MD notified  0010 05/24/23 RN attempted to call son, Emileigh Kellett at [phone]. No answer and unable to leave message due to mailbox being full. Next of Kin Notification  0015 05/23/22 Wasted 26ml IV hydromorphone (dilauded) in steri cycle bin with 2nd RN [facility]. Medication Waste  [phone] RN called son, Iantha Fallen and asked if son would like to come up to unit, he refused at this time. He is unsure of which funeral home he will use at this time, patient placement number provided to him and condolences given.  0041 [facility] notified, spoke with Jonetta Osgood, possible tissue and eye donation, eye prep completed. [phone]

## 2022-06-07 ENCOUNTER — Other Ambulatory Visit (HOSPITAL_COMMUNITY): Payer: Self-pay

## 2022-06-09 DIAGNOSIS — I5033 Acute on chronic diastolic (congestive) heart failure: Secondary | ICD-10-CM | POA: Diagnosis not present

## 2022-06-09 DIAGNOSIS — R54 Age-related physical debility: Secondary | ICD-10-CM | POA: Diagnosis not present

## 2022-06-19 NOTE — Progress Notes (Signed)
PROGRESS NOTE        PATIENT DETAILS Name: Ashley [name] Age: 76 y.o. Sex: female Date of Birth: [birth year] Admit Date: 06/08/22 Admitting Physician Emeline General, MD XAJ:OINOM, Zollie Beckers, MD  Brief Summary: Patient is a 76 y.o.  female with history of PAF on Eliquis, chronic HFpEF, chronic right pleural effusion, prior CVA-who presented with acute metabolic encephalopathy due to s nonconvulsive status epilepticus.  Even with initiation of multiple antiepileptics-patient remained unresponsive and in status.  After discussion with family-she was transitioned to full comfort measures on 8/30.    Significant events: 8/26>> admit to [facility]-encephalopathy due to seizures. 8/30>> transitioned to comfort measures.  Significant studies: 8/26>> CT head: Remote right temporoparietal infarct. 8/26>> Spot EEG: Focal nonconvulsive status epilepticus 8/26-8/27>> LTM EEG: Evidence of epileptogenicity arising on the right hemisphere 8/29>> MRI brain: Foci of reduced diffusion within chronic right parietotemporal infarct. 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  Significant microbiology data:   Procedures:   Consults: Neurology Palliative care  Subjective: Seen earlier this morning-moans when her abdomen is felt.  Objective: Vitals: Blood pressure (!) 141/78, pulse (!) 50, temperature 98.8 F (37.1 C), temperature source Axillary, resp. rate 13, weight 91.7 kg, SpO2 96 %.   Exam: Unresponsive-having apneic spells.  Pertinent Labs/Radiology:    Latest Ref Rng & Units 05/15/2022    4:30 AM 2022/06/08   11:20 AM 08-Jun-2022   10:50 AM  CBC  WBC 4.0 - 10.5 K/uL 7.7   10.8   Hemoglobin 12.0 - 15.0 g/dL 76.7  20.9  47.0   Hematocrit 36.0 - 46.0 % 30.7  36.0  33.0   Platelets 150 - 400 K/uL 211   314     Lab Results  Component Value Date   NA 141 05/17/2022   K 3.5 05/17/2022   CL 106 05/17/2022   CO2 25 05/17/2022       Assessment/Plan: Acute metabolic encephalopathy Nonconvulsive status epilepticus Since she continued to be in status epilepticus in spite of being on numerous AEDs-after discussion with neurology/son-patient was transitioned to full comfort care on 8/30.  Although comfortable-morning when abdomen palpated-I have asked RN to titrate up Dilaudid infusion.  Suspect she is very close to end-of-life-expect inpatient death.  Son not keen on transfer to residential hospice.  Acute combined hypoxic/hypercarbic respiratory failure: Thought to be due to seizures and use of benzodiazepines.  Comfort measures in effect.  PAF: No longer on Eliquis.  Severe pulmonary hypertension: No longer on sildenafil.  HTN: No longer on antihypertensives  Pressure Ulcer: Pressure Injury 05/15/22 Sacrum Stage 2 -  Partial thickness loss of dermis presenting as a shallow open injury with a red, pink wound bed without slough. (Active)  05/15/22 2215  Location: Sacrum  Location Orientation:   Staging: Stage 2 -  Partial thickness loss of dermis presenting as a shallow open injury with a red, pink wound bed without slough.  Wound Description (Comments):   Present on Admission: Yes  Dressing Type Foam - Lift dressing to assess site every shift 06/18/2022 1029   Obesity: Estimated body mass index is 30.74 kg/m as calculated from the following:   Height as of 03/31/22: 5\' 8"  (1.727 m).   Weight as of this encounter: 91.7 kg.   Code status:   Code Status: DNR   DVT Prophylaxis: Son-[phone]-updated over the phone on  9/02.   Disposition Plan: Status is: Inpatient Remains inpatient appropriate because: Status epilepticus-transition to comfort measures.   Planned Discharge Destination: Inpatient death    Diet: Diet Order             Diet regular Room service appropriate? No; Fluid consistency: Thin  Diet effective now                     Antimicrobial agents: Anti-infectives (From  admission, onward)    None        MEDICATIONS: Scheduled Meds:    Continuous Infusions:  HYDROmorphone 0.5 mg/hr (05/21/22 1051)   levETIRAcetam 1,500 mg (05/30/2022 0932)   PRN Meds:.acetaminophen **OR** acetaminophen, albuterol, atropine, HYDROmorphone, LORazepam **OR** LORazepam **OR** LORazepam, ondansetron **OR** ondansetron (ZOFRAN) IV, polyvinyl alcohol   I have personally reviewed following labs and imaging studies  LABORATORY DATA: CBC: No results for input(s): "WBC", "NEUTROABS", "HGB", "HCT", "MCV", "PLT" in the last 168 hours.   Basic Metabolic Panel: Recent Labs  Lab 05/16/22 0335 05/17/22 0301  NA 137 141  K 3.6 3.5  CL 107 106  CO2 26 25  GLUCOSE 85 76  BUN 9 9  CREATININE 0.73 0.75  CALCIUM 8.3* 8.7*     GFR: Estimated Creatinine Clearance: 71.9 mL/min (by C-G formula based on SCr of 0.75 mg/dL).  Liver Function Tests: No results for input(s): "AST", "ALT", "ALKPHOS", "BILITOT", "PROT", "ALBUMIN" in the last 168 hours.  No results for input(s): "LIPASE", "AMYLASE" in the last 168 hours. No results for input(s): "AMMONIA" in the last 168 hours.  Coagulation Profile: No results for input(s): "INR", "PROTIME" in the last 168 hours.   Cardiac Enzymes: No results for input(s): "CKTOTAL", "CKMB", "CKMBINDEX", "TROPONINI" in the last 168 hours.  BNP (last 3 results) No results for input(s): "PROBNP" in the last 8760 hours.  Lipid Profile: No results for input(s): "CHOL", "HDL", "LDLCALC", "TRIG", "CHOLHDL", "LDLDIRECT" in the last 72 hours.  Thyroid Function Tests: No results for input(s): "TSH", "T4TOTAL", "FREET4", "T3FREE", "THYROIDAB" in the last 72 hours.  Anemia Panel: No results for input(s): "VITAMINB12", "FOLATE", "FERRITIN", "TIBC", "IRON", "RETICCTPCT" in the last 72 hours.  Urine analysis:    Component Value Date/Time   COLORURINE YELLOW 03/26/2022 1023   APPEARANCEUR CLEAR 03/26/2022 1023   LABSPEC 1.006 03/26/2022 1023    PHURINE 6.0 03/26/2022 1023   GLUCOSEU NEGATIVE 03/26/2022 1023   HGBUR SMALL (A) 03/26/2022 1023   BILIRUBINUR NEGATIVE 03/26/2022 1023   KETONESUR NEGATIVE 03/26/2022 1023   PROTEINUR NEGATIVE 03/26/2022 1023   NITRITE NEGATIVE 03/26/2022 1023   LEUKOCYTESUR LARGE (A) 03/26/2022 1023    Sepsis Labs: Lactic Acid, Venous    Component Value Date/Time   LATICACIDVEN 2.0 (HH) 06/21/2021 1832    MICROBIOLOGY: No results found for this or any previous visit (from the past 240 hour(s)).  RADIOLOGY STUDIES/RESULTS: No results found.   LOS: 8 days   Jeoffrey Massed, MD  [facility]    To contact the attending provider between 7A-7P or the covering provider during after hours 7P-7A, please log into the web site [email or web address] and access using universal [place] password for that web site. If you do not have the password, please call the hospital operator.  06/12/2022, 2:07 PM

## 2022-06-19 NOTE — Progress Notes (Signed)
   05/17/22 1939  Assess: MEWS Score  Temp (!) 102.5 F (39.2 C)  BP 100/65  MAP (mmHg) 76  Pulse Rate 63  ECG Heart Rate 64  Resp 16  Level of Consciousness Responds to Voice  SpO2 90 %  O2 Device Room Air  Assess: MEWS Score  MEWS Temp 2  MEWS Systolic 1  MEWS Pulse 0  MEWS RR 0  MEWS LOC 1  MEWS Score 4  MEWS Score Color Red  Assess: if the MEWS score is Yellow or Red  Were vital signs taken at a resting state? Yes  Focused Assessment No change from prior assessment  Does the patient meet 2 or more of the SIRS criteria? Yes  Does the patient have a confirmed or suspected source of infection? No  MEWS guidelines implemented *See Row Information* No, vital signs rechecked  Treat  MEWS Interventions Escalated (See documentation below);Administered prn meds/treatments (ice packs.wash cloths till provider responds)  Pain Scale PAINAD  Pain Score 0  Take Vital Signs  Increase Vital Sign Frequency  Red: Q 1hr X 4 then Q 4hr X 4, if remains red, continue Q 4hrs  Escalate  MEWS: Escalate Red: discuss with charge nurse/RN and provider, consider discussing with RRT  Notify: Charge Nurse/RN  Name of Charge Nurse/RN Notified Matt, RN  Date Charge Nurse/RN Notified 05/17/22  Time Charge Nurse/RN Notified 1945  Notify: Provider  Provider Name/Title Shalhoub, MD  Date Provider Notified 05/17/22  Method of Notification Page  Notification Reason Other (Comment);Red med refusal (temp 102.5 axillary and refusing po meds)  Provider response Other (Comment) (awaiting response)  Document  Patient Outcome Stabilized after interventions  Assess: SIRS CRITERIA  SIRS Temperature  1  SIRS Pulse 0  SIRS Respirations  0  SIRS WBC 1  SIRS Score Sum  2

## 2022-06-19 NOTE — Death Summary Note (Signed)
DEATH SUMMARY   Patient Details  Name: Arriyanna Mersch MRN: [id number] DOB: [birth year] UKG:URKYH, Zollie Beckers, MD Admission/Discharge Information   Admit Date:  06/02/2022  Date of Death: Date of Death: 06/10/22  Time of Death: Time of Death: 01/21/2349  Length of Stay: 8   Principle Cause of death: Status epilepticus.  Hospital Diagnoses: Principal Problem:   Seizure [facility]) Active Problems:   Acute metabolic encephalopathy   Controlled diabetes mellitus type II without complication (HCC)   Essential hypertension   Acute respiratory failure with hypoxia (HCC)   Pulmonary hypertension (HCC)   Pressure injury of skin   Hospital Course: Patient is a 76 y.o.  female with history of PAF on Eliquis, chronic HFpEF, chronic right pleural effusion, prior CVA-who presented with acute metabolic encephalopathy due to s nonconvulsive status epilepticus.  Even with initiation of multiple antiepileptics-patient remained unresponsive and in status.  After discussion with family-she was transitioned to full comfort measures on 8/30.     Significant events: 8/26>> admit to [facility]-encephalopathy due to seizures. 8/30>> transitioned to comfort measures.   Significant studies: 8/26>> CT head: Remote right temporoparietal infarct. 8/26>> Spot EEG: Focal nonconvulsive status epilepticus 8/Jun 02, 2026>> LTM EEG: Evidence of epileptogenicity arising on the right hemisphere 8/29>> MRI brain: Foci of reduced diffusion within chronic right parietotemporal infarct. 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   Significant microbiology data:     Procedures:     Consults: Neurology Palliative care  Assessment and Plan: Acute metabolic encephalopathy Nonconvulsive status epilepticus Since she continued to be in status epilepticus in spite of being on numerous AEDs-after discussion with neurology/son-since patient did not want any aggressive care-patient was then  transition to full comfort measures.  She was started on a Dilaudid infusion-and other supportive care.   Acute combined hypoxic/hypercarbic respiratory failure: Thought to be due to seizures and use of benzodiazepines.    PAF: No longer on Eliquis.   Severe pulmonary hypertension: No longer on sildenafil.   HTN: No longer on antihypertensives    The results of significant diagnostics from this hospitalization (including imaging, microbiology, ancillary and laboratory) are listed below for reference.   Significant Diagnostic Studies: MR BRAIN WO CONTRAST  Result Date: 05/17/2022 CLINICAL DATA:  Seizure disorder, clinical change EXAM: MRI HEAD WITHOUT CONTRAST TECHNIQUE: Multiplanar, multiecho pulse sequences of the brain and surrounding structures were obtained without intravenous contrast. COMPARISON:  01/22/2020 FINDINGS: Motion artifact is present. Brain: Chronic right parietotemporal infarct. Associated chronic blood products. There is some reduced diffusion in this region. Additional patchy T2 hyperintensity in the supratentorial white matter is nonspecific but may reflect mild chronic microvascular ischemic changes. Prominence of the ventricles and sulci reflects new mild parenchymal volume loss. There is no intracranial mass or mass effect. No hydrocephalus or extra-axial collection. Vascular: Major vessel flow voids at the skull base are preserved. Skull and upper cervical spine: Normal marrow signal is preserved. Sinuses/Orbits: Minor mucosal thickening.  Orbits are unremarkable. Other: Sella is partially empty. Right greater than left mastoid fluid opacification. IMPRESSION: Foci of reduced diffusion are with the chronic right parietotemporal infarct and unlikely to represent acute ischemic change. May be artifactual due to adjacent chronic blood products or possibly seizure effect. New mild parenchymal volume loss. Similar mild chronic microvascular ischemic changes. Electronically Signed   By:  Guadlupe Spanish M.D.   On: 05/17/2022 12:14   Overnight EEG with video  Result Date: 05/15/2022 Charlsie Quest, MD     05/17/2022  9:02 AM Patient  Name: Kourtney Terriquez MRN: [id number] Epilepsy Attending: Charlsie Quest Referring Physician/Provider: Emeline General, MD Duration:  04/22/2022 1240 to 05/15/2022 1240  Patient history: 58 F with dysarthria, leftward gaze and left-sided weakness. EEG to evaluate for seizure.  Level of alertness: lethargic  AEDs during EEG study: LEV  Technical aspects: This EEG study was done with scalp electrodes positioned according to the 10-20 International system of electrode placement. Electrical activity was reviewed with band pass filter of 1-70Hz , sensitivity of 7 uV/mm, display speed of 66mm/sec with a 60Hz  notched filter applied as appropriate. EEG data were recorded continuously and digitally stored.  Video monitoring was available and reviewed as appropriate.  Description: EEG showed continuous generalized 3-6hz  theta-delta slowing. Lateralized periodic discharges (LPD) with overriding fast activity were noted at 1hz  in right hemisphere, maximal right fronto-temporal region. LPD at times appear rhythmic without definite evolution lasting 4-8 seconds consistent with brief-ictal-interictal rhythmic discharges ( BIRDS) . Hyperventilation and photic stimulation were not performed.    ABNORMALITY - Lateralized periodic discharges with overriding fast activity, ( LPD+F), right hemisphere, maximal right fronto-temporal region -Brief-ictal-interictal rhythmic discharges ( BIRDS), right hemisphere, maximal right fronto-temporal region - Continuous slow, generalized  IMPRESSION: This study showed evidence of epileptogenicity arising from right hemisphere, maximal fronto-temporal region likely due to underlying structural abnormality as well as increased risk of seizure recurrence. Additionally there is moderate to severe diffuse encephalopathy, nonspecific etiology but likely  related to seizure.      DG Chest Port 1 View  Result Date: 05/15/2022 CLINICAL DATA:  Chest pain, CHF EXAM: PORTABLE CHEST 1 VIEW COMPARISON:  Previous studies including the examination done on 04/29/2022 FINDINGS: Transverse diameter of heart is increased. Moderate to large right pleural effusion is seen. There is improvement in aeration in left lower lung field. Central pulmonary vessels are prominent without signs of alveolar pulmonary edema. Evaluation of right mid and right lower lung fields for infiltrates is limited by the effusion. There is no pneumothorax. IMPRESSION: Moderate to large right pleural effusion with possible underlying atelectasis. Central pulmonary vessels are prominent without signs of alveolar pulmonary edema. Electronically Signed   By: 05/17/2022 M.D.   On: 05/15/2022 08:46   DG Chest Port 1 View  Result Date: 05/15/2022 CLINICAL DATA:  Shortness of breath. Diminished right breast sounds. EXAM: PORTABLE CHEST 1 VIEW COMPARISON:  May 15, 2022 at 7:15 a.m. FINDINGS: Persistent moderate right pleural effusion with underlying opacity. Stable cardiomegaly. Possible pulmonary venous congestion/mild edema. The hila and mediastinum are unchanged. No pneumothorax. IMPRESSION: Stable moderate right pleural effusion with underlying opacity. Probable mild pulmonary venous congestion/edema. No other changes. Electronically Signed   By: 05/17/2022 III M.D.   On: 05/15/2022 07:55   EEG adult  Result Date: 05/12/2022 05/17/2022, MD     04/28/2022 12:56 PM Patient Name: Meleena Munroe MRN: 05/16/2022 Epilepsy Attending: [name] Referring Physician/Provider: [id number], MD Date: 05/08/2022 Duration: 22.43 mins Patient history: 27 F with dysarthria, leftward gaze and left-sided weakness. EEG to evaluate for seizure. Level of alertness: lethargic AEDs during EEG study: Ativan Technical aspects: This EEG study was done with scalp electrodes  positioned according to the 10-20 International system of electrode placement. Electrical activity was reviewed with band pass filter of 1-70Hz , sensitivity of 7 uV/mm, display speed of 44mm/sec with a 60Hz  notched filter applied as appropriate. EEG data were recorded continuously and digitally stored.  Video monitoring was available and reviewed as appropriate.  Description: EEG showed 3-5Hz  theta-delta slowing admixed with spike and wave in right frontotemporal region with evolution in morphology and frequency lasting 8-30 seconds followed by attenuation. No clinical signs were seen. This EEG pattern is consistent with focal non-convulsive status epilepticus. EEG also showed near continuous generalized 3-6hz  theta-delta slowing. Hyperventilation and photic stimulation were not performed.   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 Dr. Wilford Corner was notified. Charlsie Quest   DG Chest Port 1 View  Result Date: 01-Jun-2022 CLINICAL DATA:  Code stroke.  Shortness of breath. EXAM: PORTABLE CHEST 1 VIEW COMPARISON:  March 28, 2022 FINDINGS: A moderate to large right pleural effusion with underlying opacity is stable. Opacity in left base with obscuration left hemidiaphragm is new. The cardiomediastinal silhouette is stable. No pneumothorax. No other abnormalities. IMPRESSION: 1. New opacity in the retrocardiac region obscuring the left hemidiaphragm may represent a small effusion and atelectasis. Developing infiltrate not excluded. 2. Moderate to large right pleural effusion, stable. 3. No other interval changes. Electronically Signed   By: Gerome Sam III M.D.   On: 06/01/22 12:37   CT HEAD CODE STROKE WO  CONTRAST  Result Date: Jun 01, 2022 CLINICAL DATA:  Code stroke. Neuro deficit with acute stroke suspected EXAM: CT HEAD WITHOUT CONTRAST TECHNIQUE: Contiguous axial images were obtained from the base of the skull through the vertex without intravenous contrast. RADIATION DOSE REDUCTION: This exam was performed according to the departmental dose-optimization program which includes automated exposure control, adjustment of the mA and/or kV according to patient size and/or use of iterative reconstruction technique. COMPARISON:  03/26/2022 FINDINGS: Brain: No evidence of acute infarction, hemorrhage, hydrocephalus, extra-axial collection or mass lesion/mass effect. Remote right temporoparietal cortex infarct. Mild cortical atrophy for age. Vascular: No hyperdense vessel or unexpected calcification. Skull: Normal. Negative for fracture or focal lesion. Sinuses/Orbits: Chronic right more than left mastoid opacification with negative nasopharynx. Other: Large right pleural effusion by scanogram, chronic based on prior imaging. These results were communicated to Dr. Wilford Corner at 11:02 am on 06-01-2022 by text page via the [facility] messaging system. ASPECTS [facility] Stroke Program Early CT Score) Not scored without localizing symptom. IMPRESSION: 1. No acute finding. 2. Remote right temporal parietal infarct. Electronically Signed   By: Tiburcio Pea M.D.   On: 06-01-22 11:02    Microbiology: No results found for this or any previous visit (from the past 240 hour(s)).    Signed: Jeoffrey Massed, MD 05/24/22

## 2022-06-19 NOTE — Progress Notes (Signed)
Daily Progress Note   Patient Name: Ashley [name]       Date: 06/11/2022 DOB: 12/14/1945  Age: 76 y.o. MRN#: [id number] Attending Physician: Maretta Bees, MD Primary Care Physician: Merri Brunette, MD Admit Date: 04/20/2022  Reason for Consultation/Follow-up: Establishing goals of care  Subjective: Chart review performed.  Patient assessed at the bedside.  She is resting comfortably, will briefly open her eyes to my voice.  No family present during my visit.  Length of Stay: 8  Physical Exam Vitals and nursing note reviewed.  Constitutional:      General: She is not in acute distress.    Appearance: She is ill-appearing.  Pulmonary:     Effort: No respiratory distress.  Skin:    General: Skin is warm and dry.  Neurological:     Mental Status: She is lethargic.     Motor: Weakness present.            Vital Signs: BP (!) 141/78 (BP Location: Right Arm)   Pulse (!) 50   Temp 98.8 F (37.1 C) (Axillary)   Resp 13   Wt 91.7 kg   SpO2 96%   BMI 30.74 kg/m  SpO2: SpO2: 96 % O2 Device: O2 Device: Room Air O2 Flow Rate: O2 Flow Rate (L/min): 2 L/min      Palliative Assessment/Data: PPS 10%   Palliative Care Assessment & Plan   Patient Profile: 76 y.o. 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    On initial evaluation in the ED, neurology noticed patient had left-sided nystagmus as well as mentation changes, acute seizure was suspected and Ativan 2 mg IV push done. Patient then became somnolent  requiring oxygen.  Seizure was confirmed by EEG showing nonconvulsive status epilepticus.  PMT has been consulted to assist with goals of care conversation.  Assessment: Principal Problem:   Seizure (HCC) Active Problems:   Essential hypertension   Controlled diabetes mellitus type II without complication (HCC)   Acute metabolic encephalopathy   Acute respiratory failure with hypoxia (HCC)   Pulmonary hypertension (HCC)   Pressure injury of skin  End of life  Recommendations/Plan: Continue DNR Continue comfort care measures per [facility] PMT will continue to follow and support holistically   Prognosis:  Hours - Days  Discharge Planning: Anticipated Hospital Death   Total time: I spent 25 minutes in the care of the patient today in the above activities and documenting the encounter.   Richardson Dopp, PA-C Palliative Medicine Team Team phone # [phone]  Thank you for allowing the Palliative Medicine Team to assist in the care of this patient. Please utilize secure chat with additional questions, if there is no response within 30 minutes please call the above phone number.  Palliative Medicine Team providers are available by phone from 7am to 7pm daily and can be reached through the team cell phone.  Should this patient require assistance outside of these hours, please call the patient's attending physician.

## 2022-06-19 DEATH — deceased

## 2023-06-23 IMAGING — DX DG CHEST 1V PORT
1 series · 1 of 1 positions shown · non-contrast
Comparison: June 19, 2021

CLINICAL DATA: Right-sided PICC line placement.

EXAM:
PORTABLE CHEST 1 VIEW

[chest]
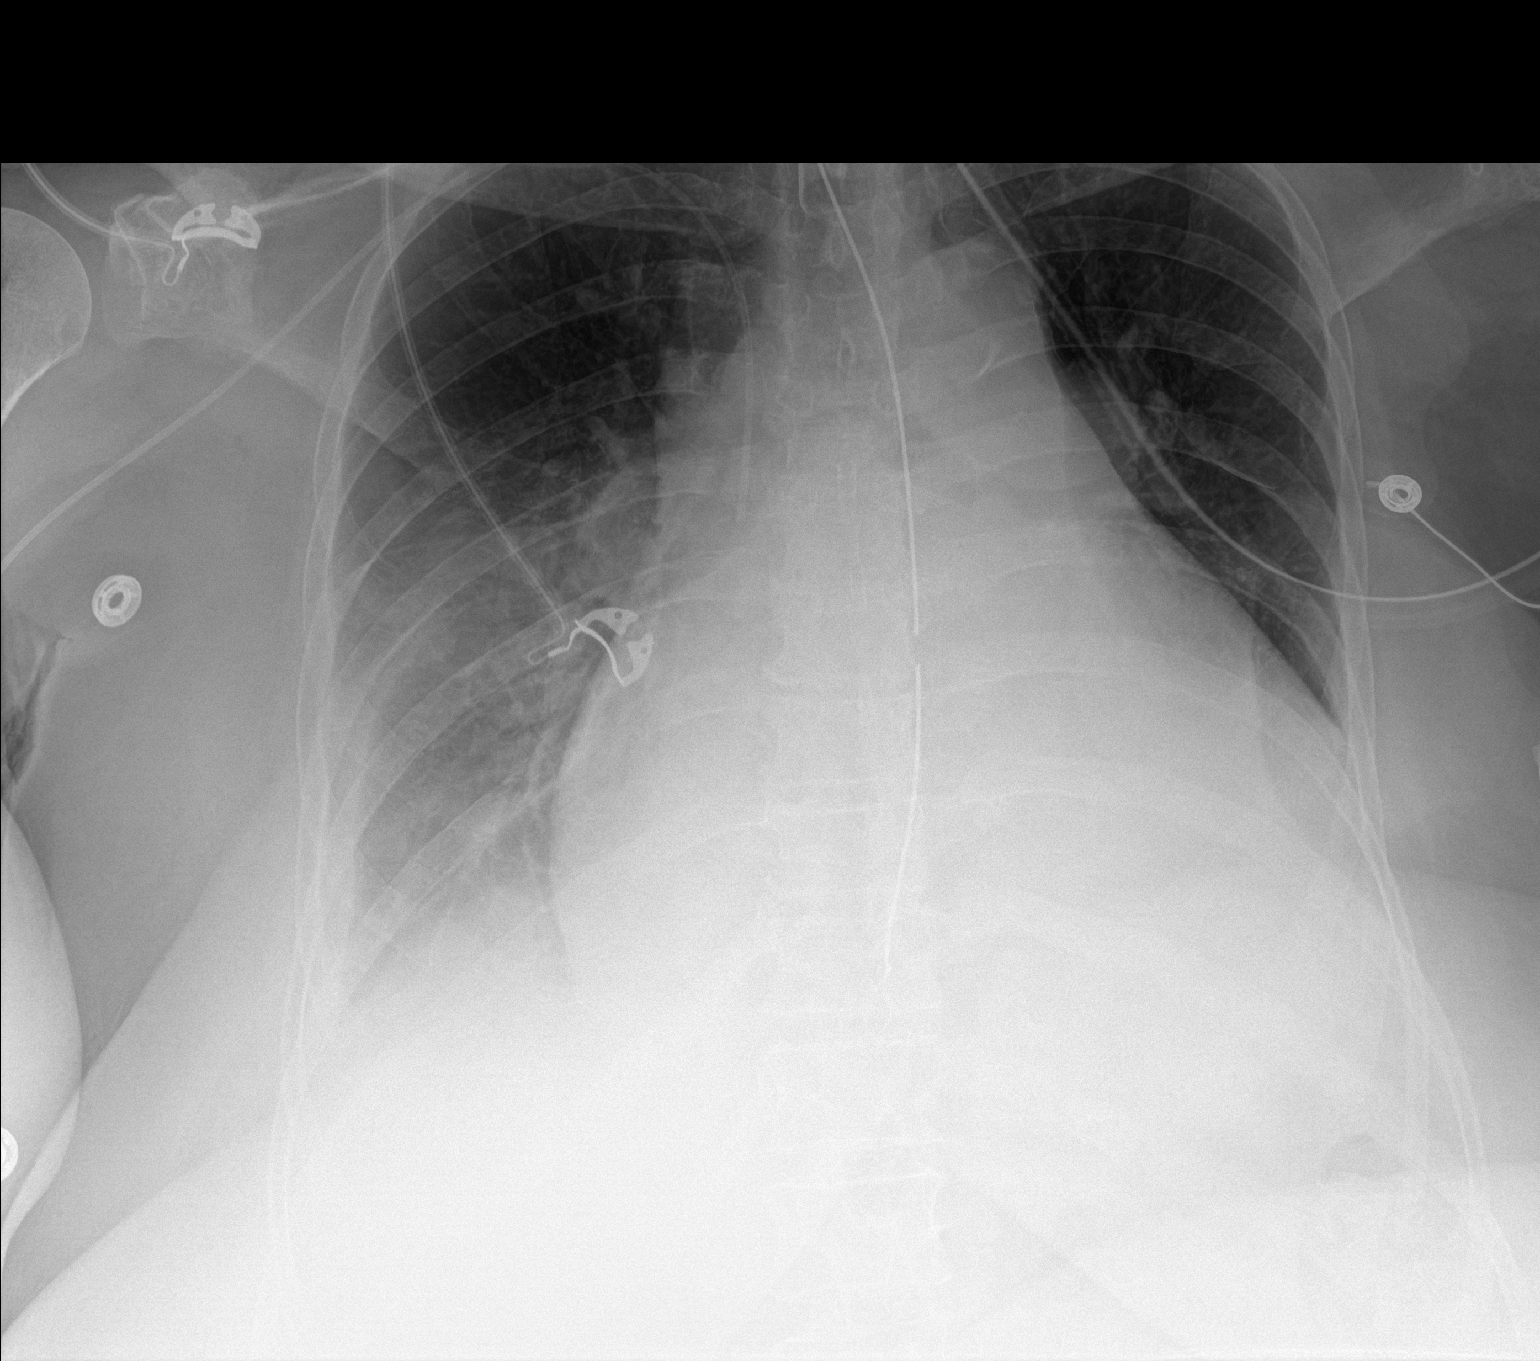

[1 of 1 positions shown; findings below may reference images not displayed]

FINDINGS: An endotracheal tube is noted with its distal tip approximately
cm from the carina. A nasogastric tube is present with its distal
end overlying the distal aspect of the esophagus. A right-sided PICC
line is seen. Its distal tip is noted at the junction of the
superior vena cava and right atrium. Mild to moderate severity areas
of bibasilar atelectasis and/or infiltrate are seen. There are small
bilateral pleural effusions. No pneumothorax is identified. The
cardiac silhouette is markedly enlarged and unchanged in size the
visualized skeletal structures are unremarkable.
IMPRESSION: 1. Mild to moderate severity bibasilar atelectasis and/or
infiltrate.
2. Small bilateral pleural effusions.
3. A right-sided PICC line is seen with its distal tip at the
junction of the superior vena cava and right atrium.
4. Endotracheal and nasogastric tube positioning, as described
above. Further advancement of the NG tube by approximately 8 cm is
recommended to decrease the risk of aspiration.

## 2023-07-30 IMAGING — DX DG CHEST 2V
2 series · 2 of 2 positions shown · non-contrast
Comparison: Radiographs dated June 21, 2021 the

CLINICAL DATA: Shortness of breath

EXAM:
CHEST - 2 VIEW

[chest lat]
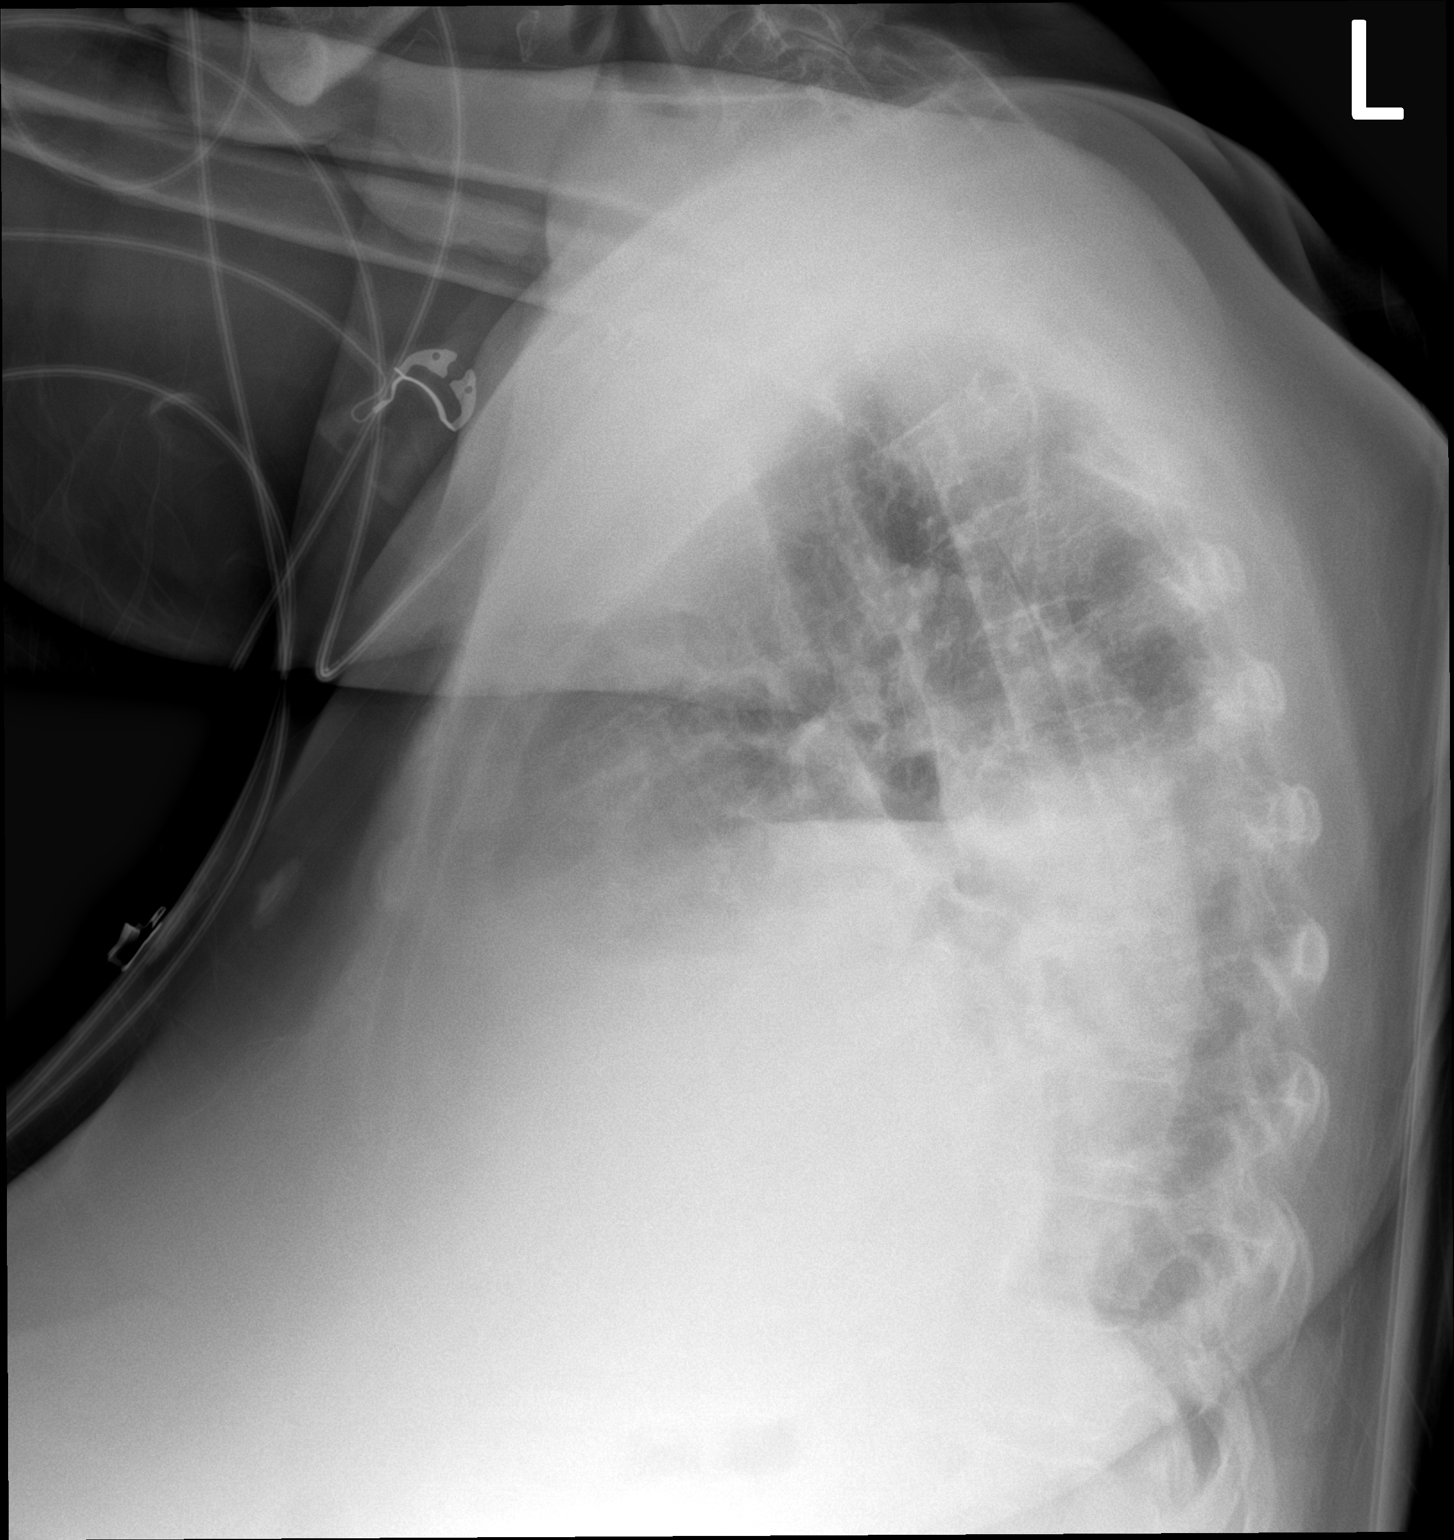

[chest ap]
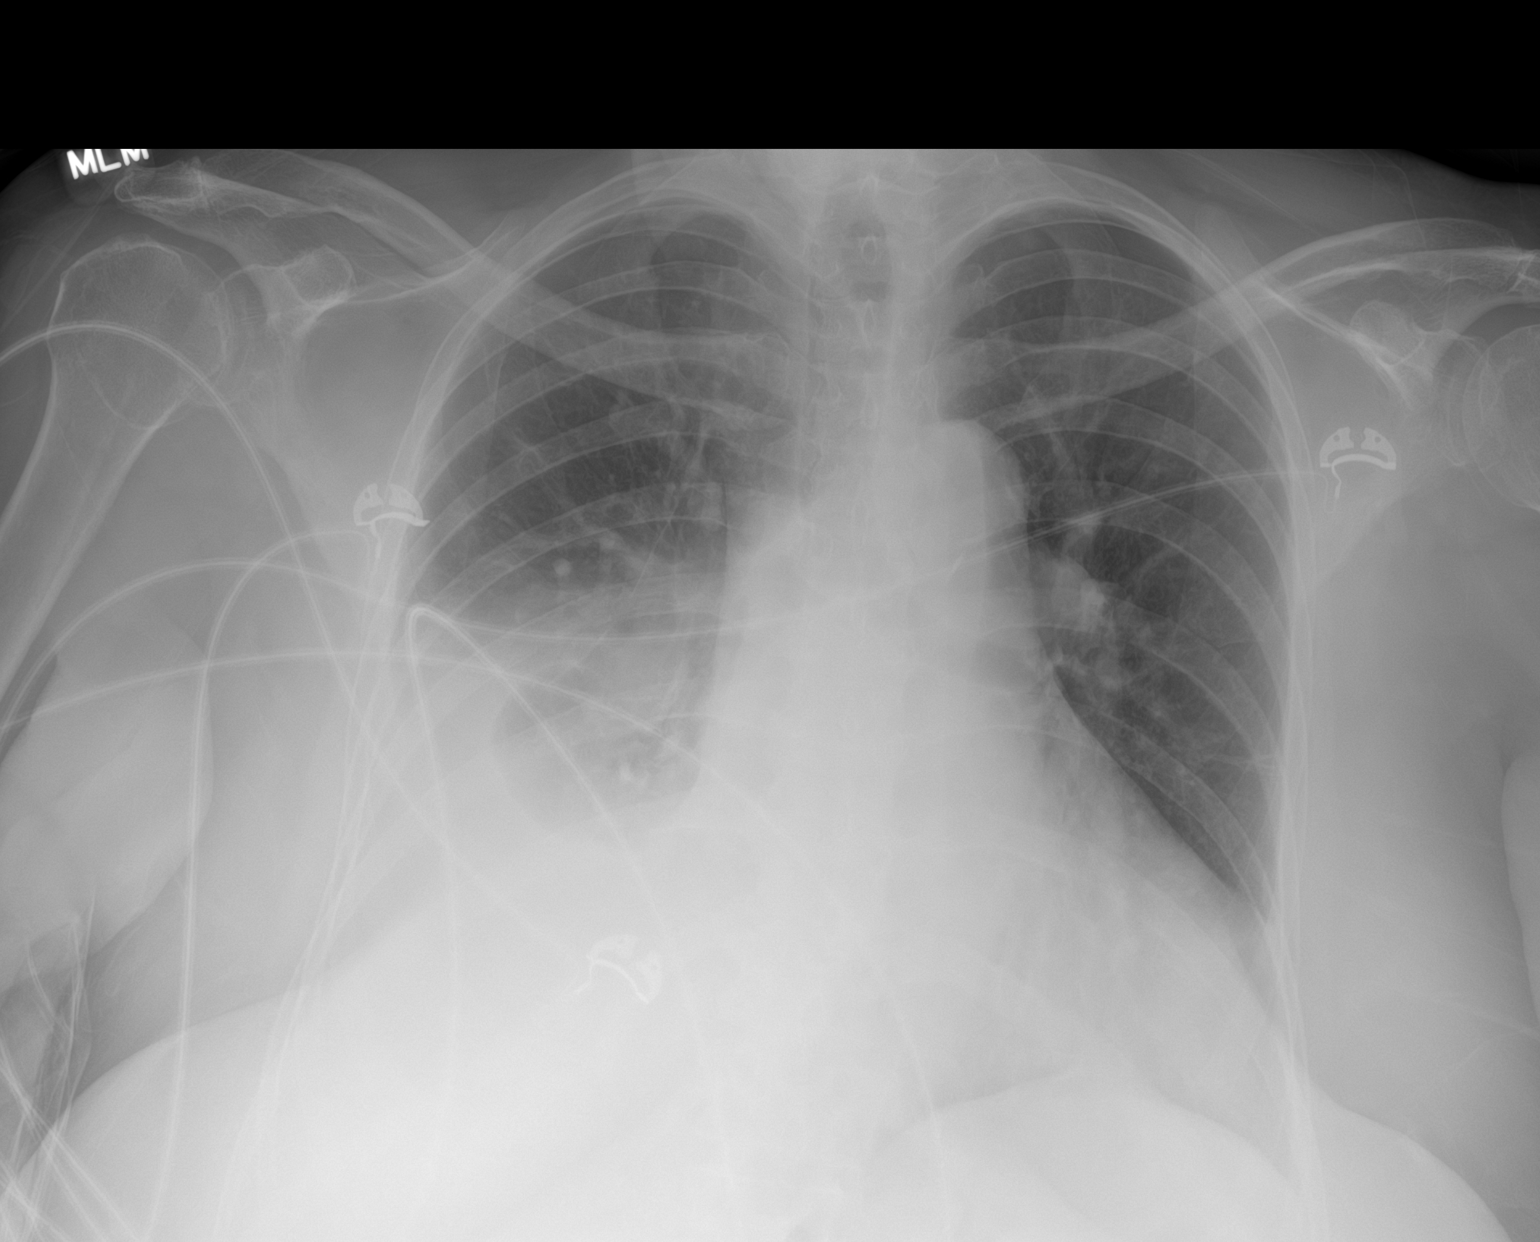

[2 of 2 positions shown; findings below may reference images not displayed]

FINDINGS: The heart is markedly enlarged. There are bilateral pleural
effusions, right greater than the left. Underlying airspace disease
cannot be excluded.
IMPRESSION: 1.  Marked cardiomegaly.

2. Bilateral pleural effusions, right greater than the left.
Underlying airspace disease cannot be excluded.
